# Patient Record
Sex: Female | Born: 1960
Health system: Southern US, Community
[De-identification: ages and names within clinical notes are randomized; demographics above are authoritative.]

## PROBLEM LIST (undated history)

## (undated) ENCOUNTER — Emergency Department (HOSPITAL_COMMUNITY): Admission: EM | Payer: BLUE CROSS/BLUE SHIELD | Source: Home / Self Care

## (undated) DIAGNOSIS — M1990 Unspecified osteoarthritis, unspecified site: Secondary | ICD-10-CM

## (undated) DIAGNOSIS — J989 Respiratory disorder, unspecified: Secondary | ICD-10-CM

## (undated) DIAGNOSIS — E039 Hypothyroidism, unspecified: Secondary | ICD-10-CM

## (undated) DIAGNOSIS — J189 Pneumonia, unspecified organism: Secondary | ICD-10-CM

## (undated) DIAGNOSIS — Z86718 Personal history of other venous thrombosis and embolism: Secondary | ICD-10-CM

## (undated) DIAGNOSIS — Z8489 Family history of other specified conditions: Secondary | ICD-10-CM

## (undated) DIAGNOSIS — G629 Polyneuropathy, unspecified: Secondary | ICD-10-CM

## (undated) DIAGNOSIS — M797 Fibromyalgia: Secondary | ICD-10-CM

## (undated) DIAGNOSIS — O24419 Gestational diabetes mellitus in pregnancy, unspecified control: Secondary | ICD-10-CM

## (undated) DIAGNOSIS — I429 Cardiomyopathy, unspecified: Secondary | ICD-10-CM

## (undated) DIAGNOSIS — R52 Pain, unspecified: Secondary | ICD-10-CM

## (undated) DIAGNOSIS — R51 Headache: Secondary | ICD-10-CM

## (undated) DIAGNOSIS — R0600 Dyspnea, unspecified: Secondary | ICD-10-CM

## (undated) DIAGNOSIS — F209 Schizophrenia, unspecified: Secondary | ICD-10-CM

## (undated) DIAGNOSIS — R252 Cramp and spasm: Secondary | ICD-10-CM

## (undated) DIAGNOSIS — J40 Bronchitis, not specified as acute or chronic: Secondary | ICD-10-CM

## (undated) DIAGNOSIS — H9190 Unspecified hearing loss, unspecified ear: Secondary | ICD-10-CM

## (undated) DIAGNOSIS — Z87891 Personal history of nicotine dependence: Secondary | ICD-10-CM

## (undated) DIAGNOSIS — I10 Essential (primary) hypertension: Secondary | ICD-10-CM

## (undated) DIAGNOSIS — O24919 Unspecified diabetes mellitus in pregnancy, unspecified trimester: Secondary | ICD-10-CM

## (undated) DIAGNOSIS — F32A Depression, unspecified: Secondary | ICD-10-CM

## (undated) DIAGNOSIS — J45909 Unspecified asthma, uncomplicated: Secondary | ICD-10-CM

## (undated) DIAGNOSIS — F319 Bipolar disorder, unspecified: Secondary | ICD-10-CM

## (undated) DIAGNOSIS — S3992XA Unspecified injury of lower back, initial encounter: Secondary | ICD-10-CM

## (undated) DIAGNOSIS — J9819 Other pulmonary collapse: Secondary | ICD-10-CM

## (undated) DIAGNOSIS — I5189 Other ill-defined heart diseases: Secondary | ICD-10-CM

## (undated) DIAGNOSIS — F4310 Post-traumatic stress disorder, unspecified: Secondary | ICD-10-CM

## (undated) DIAGNOSIS — I499 Cardiac arrhythmia, unspecified: Secondary | ICD-10-CM

## (undated) DIAGNOSIS — F329 Major depressive disorder, single episode, unspecified: Secondary | ICD-10-CM

## (undated) HISTORY — PX: OTHER SURGICAL HISTORY: SHX169

## (undated) HISTORY — DX: Major depressive disorder, single episode, unspecified: F32.9

## (undated) HISTORY — DX: Unspecified hearing loss, unspecified ear: H91.90

## (undated) HISTORY — DX: Fibromyalgia: M79.7

## (undated) HISTORY — DX: Headache: R51

## (undated) HISTORY — DX: Unspecified injury of lower back, initial encounter: S39.92XA

## (undated) HISTORY — PX: TONSILLECTOMY AND ADENOIDECTOMY: SHX28

## (undated) HISTORY — DX: Cramp and spasm: R25.2

## (undated) HISTORY — DX: Essential (primary) hypertension: I10

## (undated) HISTORY — DX: Respiratory disorder, unspecified: J98.9

## (undated) HISTORY — DX: Unspecified osteoarthritis, unspecified site: M19.90

## (undated) HISTORY — DX: Other ill-defined heart diseases: I51.89

## (undated) HISTORY — PX: TONSILLECTOMY: SUR1361

## (undated) HISTORY — DX: Personal history of nicotine dependence: Z87.891

## (undated) HISTORY — DX: Depression, unspecified: F32.A

## (undated) HISTORY — PX: EXTERNAL EAR SURGERY: SHX627

## (undated) HISTORY — DX: Pain, unspecified: R52

## (undated) HISTORY — PX: HERNIA REPAIR: SHX51

## (undated) HISTORY — PX: CERVICAL FUSION: SHX112

## (undated) HISTORY — PX: ABDOMINAL HYSTERECTOMY: SHX81

## (undated) HISTORY — DX: Cardiomyopathy, unspecified: I42.9

## (undated) HISTORY — PX: CARDIAC CATHETERIZATION: SHX172

## (undated) HISTORY — DX: Gestational diabetes mellitus in pregnancy, unspecified control: O24.419

## (undated) HISTORY — PX: NECK SURGERY: SHX720

## (undated) HISTORY — PX: APPENDECTOMY: SHX54

---

## 1999-05-02 ENCOUNTER — Ambulatory Visit (HOSPITAL_COMMUNITY): Admission: RE | Admit: 1999-05-02 | Discharge: 1999-05-02 | Payer: Self-pay | Admitting: Pulmonary Disease

## 1999-05-02 ENCOUNTER — Encounter: Payer: Self-pay | Admitting: Pulmonary Disease

## 1999-05-08 ENCOUNTER — Inpatient Hospital Stay (HOSPITAL_COMMUNITY): Admission: AD | Admit: 1999-05-08 | Discharge: 1999-05-10 | Payer: Self-pay | Admitting: Pulmonary Disease

## 1999-05-09 ENCOUNTER — Encounter: Payer: Self-pay | Admitting: Pulmonary Disease

## 2000-02-20 ENCOUNTER — Other Ambulatory Visit: Admission: RE | Admit: 2000-02-20 | Discharge: 2000-02-20 | Payer: Self-pay | Admitting: Family Medicine

## 2000-12-30 ENCOUNTER — Encounter: Admission: RE | Admit: 2000-12-30 | Discharge: 2001-02-10 | Payer: Self-pay | Admitting: Family Medicine

## 2001-06-21 ENCOUNTER — Encounter: Admission: RE | Admit: 2001-06-21 | Discharge: 2001-07-26 | Payer: Self-pay | Admitting: Family Medicine

## 2001-08-09 ENCOUNTER — Encounter: Payer: Self-pay | Admitting: Family Medicine

## 2001-08-09 ENCOUNTER — Encounter: Admission: RE | Admit: 2001-08-09 | Discharge: 2001-08-09 | Payer: Self-pay | Admitting: Family Medicine

## 2001-11-24 ENCOUNTER — Encounter: Payer: Self-pay | Admitting: Neurosurgery

## 2001-11-28 ENCOUNTER — Ambulatory Visit (HOSPITAL_COMMUNITY): Admission: RE | Admit: 2001-11-28 | Discharge: 2001-11-28 | Payer: Self-pay | Admitting: Neurosurgery

## 2001-11-28 ENCOUNTER — Encounter: Payer: Self-pay | Admitting: Neurosurgery

## 2001-12-27 ENCOUNTER — Encounter: Admission: RE | Admit: 2001-12-27 | Discharge: 2001-12-27 | Payer: Self-pay | Admitting: Neurosurgery

## 2001-12-27 ENCOUNTER — Encounter: Payer: Self-pay | Admitting: Neurosurgery

## 2002-05-16 ENCOUNTER — Encounter: Admission: RE | Admit: 2002-05-16 | Discharge: 2002-08-08 | Payer: Self-pay | Admitting: Neurosurgery

## 2002-08-28 ENCOUNTER — Encounter: Payer: Self-pay | Admitting: Family Medicine

## 2002-08-28 ENCOUNTER — Encounter: Admission: RE | Admit: 2002-08-28 | Discharge: 2002-08-28 | Payer: Self-pay | Admitting: Family Medicine

## 2003-05-14 ENCOUNTER — Emergency Department (HOSPITAL_COMMUNITY): Admission: EM | Admit: 2003-05-14 | Discharge: 2003-05-14 | Payer: Self-pay | Admitting: Emergency Medicine

## 2003-06-20 ENCOUNTER — Encounter: Admission: RE | Admit: 2003-06-20 | Discharge: 2003-06-20 | Payer: Self-pay | Admitting: Neurosurgery

## 2003-09-11 ENCOUNTER — Ambulatory Visit: Payer: Self-pay | Admitting: *Deleted

## 2003-11-27 ENCOUNTER — Encounter: Admission: RE | Admit: 2003-11-27 | Discharge: 2003-11-27 | Payer: Self-pay | Admitting: Family Medicine

## 2003-11-29 ENCOUNTER — Other Ambulatory Visit: Admission: RE | Admit: 2003-11-29 | Discharge: 2003-11-29 | Payer: Self-pay | Admitting: Family Medicine

## 2004-06-19 ENCOUNTER — Emergency Department (HOSPITAL_COMMUNITY): Admission: EM | Admit: 2004-06-19 | Discharge: 2004-06-20 | Payer: Self-pay | Admitting: Emergency Medicine

## 2004-07-14 ENCOUNTER — Ambulatory Visit: Payer: Self-pay | Admitting: *Deleted

## 2004-07-21 ENCOUNTER — Ambulatory Visit: Payer: Self-pay | Admitting: *Deleted

## 2004-07-28 ENCOUNTER — Ambulatory Visit: Payer: Self-pay | Admitting: *Deleted

## 2004-08-04 ENCOUNTER — Ambulatory Visit: Payer: Self-pay | Admitting: *Deleted

## 2004-09-01 ENCOUNTER — Ambulatory Visit: Payer: Self-pay | Admitting: *Deleted

## 2004-09-08 ENCOUNTER — Ambulatory Visit: Payer: Self-pay | Admitting: *Deleted

## 2004-09-22 ENCOUNTER — Ambulatory Visit: Payer: Self-pay | Admitting: *Deleted

## 2004-10-03 ENCOUNTER — Ambulatory Visit: Payer: Self-pay | Admitting: *Deleted

## 2004-10-17 ENCOUNTER — Ambulatory Visit: Payer: Self-pay | Admitting: *Deleted

## 2005-01-01 ENCOUNTER — Encounter: Admission: RE | Admit: 2005-01-01 | Discharge: 2005-01-01 | Payer: Self-pay | Admitting: Family Medicine

## 2006-02-11 ENCOUNTER — Other Ambulatory Visit: Admission: RE | Admit: 2006-02-11 | Discharge: 2006-02-11 | Payer: Self-pay | Admitting: Family Medicine

## 2006-05-18 ENCOUNTER — Ambulatory Visit (HOSPITAL_COMMUNITY): Admission: RE | Admit: 2006-05-18 | Discharge: 2006-05-18 | Payer: Self-pay | Admitting: Anesthesiology

## 2006-07-13 ENCOUNTER — Ambulatory Visit (HOSPITAL_COMMUNITY): Admission: RE | Admit: 2006-07-13 | Discharge: 2006-07-14 | Payer: Self-pay | Admitting: Neurosurgery

## 2006-08-11 ENCOUNTER — Encounter: Admission: RE | Admit: 2006-08-11 | Discharge: 2006-08-11 | Payer: Self-pay | Admitting: Neurosurgery

## 2007-04-01 ENCOUNTER — Ambulatory Visit: Payer: Self-pay | Admitting: *Deleted

## 2007-04-02 ENCOUNTER — Emergency Department (HOSPITAL_COMMUNITY): Admission: EM | Admit: 2007-04-02 | Discharge: 2007-04-02 | Payer: Self-pay | Admitting: Emergency Medicine

## 2007-04-20 ENCOUNTER — Ambulatory Visit: Payer: Self-pay | Admitting: *Deleted

## 2007-04-27 ENCOUNTER — Ambulatory Visit: Payer: Self-pay | Admitting: *Deleted

## 2007-05-06 ENCOUNTER — Ambulatory Visit: Payer: Self-pay | Admitting: *Deleted

## 2007-05-11 ENCOUNTER — Ambulatory Visit: Payer: Self-pay | Admitting: *Deleted

## 2007-05-18 ENCOUNTER — Ambulatory Visit: Payer: Self-pay | Admitting: *Deleted

## 2007-05-25 ENCOUNTER — Ambulatory Visit: Payer: Self-pay | Admitting: *Deleted

## 2007-07-19 ENCOUNTER — Encounter: Admission: RE | Admit: 2007-07-19 | Discharge: 2007-07-19 | Payer: Self-pay | Admitting: Anesthesiology

## 2008-08-24 LAB — HM COLONOSCOPY

## 2009-05-03 ENCOUNTER — Encounter: Admission: RE | Admit: 2009-05-03 | Discharge: 2009-05-03 | Payer: Self-pay | Admitting: Obstetrics and Gynecology

## 2009-06-11 ENCOUNTER — Inpatient Hospital Stay (HOSPITAL_COMMUNITY): Admission: EM | Admit: 2009-06-11 | Discharge: 2009-06-14 | Payer: Self-pay | Admitting: Emergency Medicine

## 2010-06-23 ENCOUNTER — Encounter: Admission: RE | Admit: 2010-06-23 | Discharge: 2010-06-23 | Payer: Self-pay | Admitting: Obstetrics and Gynecology

## 2010-09-14 ENCOUNTER — Encounter: Payer: Self-pay | Admitting: Anesthesiology

## 2010-11-25 ENCOUNTER — Encounter (HOSPITAL_COMMUNITY)
Admission: RE | Admit: 2010-11-25 | Discharge: 2010-11-25 | Disposition: A | Discharge status: Home / Self Care | Payer: 59 | Source: Ambulatory Visit | Attending: Obstetrics and Gynecology | Admitting: Obstetrics and Gynecology

## 2010-11-25 LAB — CBC
HCT: 40.7 % (ref 36.0–46.0)
Hemoglobin: 13.9 g/dL (ref 12.0–15.0)
MCH: 34.1 pg — ABNORMAL HIGH (ref 26.0–34.0)
MCHC: 34.2 g/dL (ref 30.0–36.0)
MCV: 99.8 fL (ref 78.0–100.0)
Platelets: 343 10*3/uL (ref 150–400)
RBC: 4.08 MIL/uL (ref 3.87–5.11)
RDW: 13.8 % (ref 11.5–15.5)
WBC: 8.1 10*3/uL (ref 4.0–10.5)

## 2010-11-25 LAB — SURGICAL PCR SCREEN
MRSA, PCR: NEGATIVE
Staphylococcus aureus: NEGATIVE

## 2010-11-26 ENCOUNTER — Other Ambulatory Visit: Payer: Self-pay | Admitting: Obstetrics and Gynecology

## 2010-11-26 ENCOUNTER — Ambulatory Visit (HOSPITAL_COMMUNITY)
Admission: RE | Admit: 2010-11-26 | Discharge: 2010-11-27 | Disposition: A | Discharge status: Home / Self Care | Payer: 59 | Source: Ambulatory Visit | Attending: Obstetrics and Gynecology | Admitting: Obstetrics and Gynecology

## 2010-11-26 DIAGNOSIS — N920 Excessive and frequent menstruation with regular cycle: Secondary | ICD-10-CM | POA: Insufficient documentation

## 2010-11-26 DIAGNOSIS — N946 Dysmenorrhea, unspecified: Secondary | ICD-10-CM | POA: Insufficient documentation

## 2010-11-26 LAB — ABO/RH: ABO/RH(D): A NEG

## 2010-11-26 LAB — TYPE AND SCREEN
ABO/RH(D): A NEG
Antibody Screen: NEGATIVE

## 2010-11-26 LAB — PREGNANCY, URINE: Preg Test, Ur: NEGATIVE

## 2010-11-27 LAB — CBC
HCT: 31.1 % — ABNORMAL LOW (ref 36.0–46.0)
HCT: 32.7 % — ABNORMAL LOW (ref 36.0–46.0)
HCT: 32.8 % — ABNORMAL LOW (ref 36.0–46.0)
HCT: 40.2 % (ref 36.0–46.0)
Hemoglobin: 10.2 g/dL — ABNORMAL LOW (ref 12.0–15.0)
Hemoglobin: 11.4 g/dL — ABNORMAL LOW (ref 12.0–15.0)
Hemoglobin: 11.5 g/dL — ABNORMAL LOW (ref 12.0–15.0)
Hemoglobin: 13.9 g/dL (ref 12.0–15.0)
MCH: 33.6 pg (ref 26.0–34.0)
MCHC: 32.8 g/dL (ref 30.0–36.0)
MCHC: 34.6 g/dL (ref 30.0–36.0)
MCHC: 34.7 g/dL (ref 30.0–36.0)
MCHC: 35.1 g/dL (ref 30.0–36.0)
MCV: 102.3 fL — ABNORMAL HIGH (ref 78.0–100.0)
MCV: 98.8 fL (ref 78.0–100.0)
MCV: 98.9 fL (ref 78.0–100.0)
MCV: 99.3 fL (ref 78.0–100.0)
Platelets: 241 10*3/uL (ref 150–400)
Platelets: 359 10*3/uL (ref 150–400)
Platelets: 386 10*3/uL (ref 150–400)
Platelets: 428 10*3/uL — ABNORMAL HIGH (ref 150–400)
RBC: 3.04 MIL/uL — ABNORMAL LOW (ref 3.87–5.11)
RBC: 3.3 MIL/uL — ABNORMAL LOW (ref 3.87–5.11)
RBC: 3.31 MIL/uL — ABNORMAL LOW (ref 3.87–5.11)
RBC: 4.07 MIL/uL (ref 3.87–5.11)
RDW: 14.1 % (ref 11.5–15.5)
RDW: 12.9 % (ref 11.5–15.5)
RDW: 13.1 % (ref 11.5–15.5)
RDW: 13.1 % (ref 11.5–15.5)
WBC: 9 10*3/uL (ref 4.0–10.5)
WBC: 13.6 10*3/uL — ABNORMAL HIGH (ref 4.0–10.5)
WBC: 7.2 10*3/uL (ref 4.0–10.5)
WBC: 7.5 10*3/uL (ref 4.0–10.5)

## 2010-11-27 LAB — BASIC METABOLIC PANEL
BUN: 3 mg/dL — ABNORMAL LOW (ref 6–23)
BUN: 3 mg/dL — ABNORMAL LOW (ref 6–23)
BUN: 5 mg/dL — ABNORMAL LOW (ref 6–23)
CO2: 26 mEq/L (ref 19–32)
CO2: 26 mEq/L (ref 19–32)
CO2: 26 mEq/L (ref 19–32)
Calcium: 8 mg/dL — ABNORMAL LOW (ref 8.4–10.5)
Calcium: 8.6 mg/dL (ref 8.4–10.5)
Calcium: 8.8 mg/dL (ref 8.4–10.5)
Chloride: 104 mEq/L (ref 96–112)
Chloride: 105 mEq/L (ref 96–112)
Chloride: 105 mEq/L (ref 96–112)
Creatinine, Ser: 0.52 mg/dL (ref 0.4–1.2)
Creatinine, Ser: 0.63 mg/dL (ref 0.4–1.2)
Creatinine, Ser: 0.72 mg/dL (ref 0.4–1.2)
GFR calc Af Amer: >60 mL/min (ref >60)
GFR calc Af Amer: >60 mL/min (ref >60)
GFR calc Af Amer: >60 mL/min (ref >60)
GFR calc non Af Amer: >60 mL/min (ref >60)
GFR calc non Af Amer: >60 mL/min (ref >60)
GFR calc non Af Amer: >60 mL/min (ref >60)
Glucose, Bld: 104 mg/dL — ABNORMAL HIGH (ref 70–99)
Glucose, Bld: 93 mg/dL (ref 70–99)
Glucose, Bld: 95 mg/dL (ref 70–99)
Potassium: 3.4 mEq/L — ABNORMAL LOW (ref 3.5–5.1)
Potassium: 3.7 mEq/L (ref 3.5–5.1)
Potassium: 3.8 mEq/L (ref 3.5–5.1)
Sodium: 134 mEq/L — ABNORMAL LOW (ref 135–145)
Sodium: 137 mEq/L (ref 135–145)
Sodium: 138 mEq/L (ref 135–145)

## 2010-11-27 LAB — CARDIAC PANEL(CRET KIN+CKTOT+MB+TROPI)
CK, MB: 0.9 ng/mL (ref 0.3–4.0)
CK, MB: 1.6 ng/mL (ref 0.3–4.0)
Relative Index: INVALID (ref 0.0–2.5)
Relative Index: INVALID (ref 0.0–2.5)
Total CK: 37 U/L (ref 7–177)
Total CK: 54 U/L (ref 7–177)
Troponin I: 0.01 ng/mL (ref 0.00–0.06)
Troponin I: 0.01 ng/mL (ref 0.00–0.06)

## 2010-11-27 LAB — POCT I-STAT, CHEM 8
BUN: 4 mg/dL — ABNORMAL LOW (ref 6–23)
Calcium, Ion: 1.05 mmol/L — ABNORMAL LOW (ref 1.12–1.32)
Chloride: 103 mEq/L (ref 96–112)
Creatinine, Ser: 0.7 mg/dL (ref 0.4–1.2)
Glucose, Bld: 113 mg/dL — ABNORMAL HIGH (ref 70–99)
HCT: 43 % (ref 36.0–46.0)
Hemoglobin: 14.6 g/dL (ref 12.0–15.0)
Potassium: 3.6 mEq/L (ref 3.5–5.1)
Sodium: 135 mEq/L (ref 135–145)
TCO2: 26 mmol/L (ref 0–100)

## 2010-11-27 LAB — COMPREHENSIVE METABOLIC PANEL
ALT: 25 U/L (ref 0–35)
AST: 19 U/L (ref 0–37)
Albumin: 3.9 g/dL (ref 3.5–5.2)
Alkaline Phosphatase: 60 U/L (ref 39–117)
BUN: 4 mg/dL — ABNORMAL LOW (ref 6–23)
CO2: 26 mEq/L (ref 19–32)
Calcium: 9.6 mg/dL (ref 8.4–10.5)
Chloride: 103 mEq/L (ref 96–112)
Creatinine, Ser: 0.64 mg/dL (ref 0.4–1.2)
GFR calc Af Amer: >60 mL/min (ref >60)
GFR calc non Af Amer: >60 mL/min (ref >60)
Glucose, Bld: 114 mg/dL — ABNORMAL HIGH (ref 70–99)
Potassium: 3.6 mEq/L (ref 3.5–5.1)
Sodium: 138 mEq/L (ref 135–145)
Total Bilirubin: 0.6 mg/dL (ref 0.3–1.2)
Total Protein: 7.3 g/dL (ref 6.0–8.3)

## 2010-11-27 LAB — DIFFERENTIAL
Basophils Absolute: 0.1 10*3/uL (ref 0.0–0.1)
Basophils Relative: 1 % (ref 0–1)
Eosinophils Absolute: 0 10*3/uL (ref 0.0–0.7)
Eosinophils Relative: 0 % (ref 0–5)
Lymphocytes Relative: 16 % (ref 12–46)
Lymphs Abs: 2.1 10*3/uL (ref 0.7–4.0)
Monocytes Absolute: 0.9 10*3/uL (ref 0.1–1.0)
Monocytes Relative: 6 % (ref 3–12)
Neutro Abs: 10.5 10*3/uL — ABNORMAL HIGH (ref 1.7–7.7)
Neutrophils Relative %: 77 % (ref 43–77)

## 2010-11-27 LAB — URINALYSIS, ROUTINE W REFLEX MICROSCOPIC
Bilirubin Urine: NEGATIVE
Glucose, UA: NEGATIVE mg/dL
Ketones, ur: 40 mg/dL — AB
Leukocytes, UA: NEGATIVE
Nitrite: NEGATIVE
Protein, ur: NEGATIVE mg/dL
Specific Gravity, Urine: 1.011 (ref 1.005–1.030)
Urobilinogen, UA: 1 mg/dL (ref 0.0–1.0)
pH: 6.5 (ref 5.0–8.0)

## 2010-11-27 LAB — CK TOTAL AND CKMB (NOT AT ARMC)
CK, MB: 0.8 ng/mL (ref 0.3–4.0)
Relative Index: INVALID (ref 0.0–2.5)
Total CK: 44 U/L (ref 7–177)

## 2010-11-27 LAB — LIPID PANEL
Cholesterol: 176 mg/dL (ref 0–200)
HDL: 41 mg/dL (ref >39)
LDL Cholesterol: 111 mg/dL — ABNORMAL HIGH (ref 0–99)
Total CHOL/HDL Ratio: 4.3 RATIO
Triglycerides: 121 mg/dL (ref <150)
VLDL: 24 mg/dL (ref 0–40)

## 2010-11-27 LAB — PROTIME-INR
INR: 0.94 (ref 0.00–1.49)
INR: 0.97 (ref 0.00–1.49)
Prothrombin Time: 12.5 seconds (ref 11.6–15.2)
Prothrombin Time: 12.8 seconds (ref 11.6–15.2)

## 2010-11-27 LAB — TROPONIN I: Troponin I: 0.01 ng/mL (ref 0.00–0.06)

## 2010-11-27 LAB — POCT CARDIAC MARKERS
CKMB, poc: <1 ng/mL — ABNORMAL LOW (ref 1.0–8.0)
Myoglobin, poc: 72 ng/mL (ref 12–200)
Troponin i, poc: <0.05 ng/mL (ref 0.00–0.09)

## 2010-11-27 LAB — TSH: TSH: 6.901 u[IU]/mL — ABNORMAL HIGH (ref 0.350–4.500)

## 2010-11-27 LAB — URINE MICROSCOPIC-ADD ON

## 2010-11-27 LAB — GLUCOSE, CAPILLARY: Glucose-Capillary: 97 mg/dL (ref 70–99)

## 2010-12-08 NOTE — Op Note (Signed)
[name], [name] NO.:  [id number]  MEDICAL RECORD NO.:  [id number]           PATIENT TYPE:  O  LOCATION:  9303                          FACILITY:  [facility]  PHYSICIAN:  Huel Cote, M.D. DATE OF BIRTH:  December 18, 1960  DATE OF PROCEDURE:  11/26/2010 DATE OF DISCHARGE:                              OPERATIVE REPORT   PREOPERATIVE DIAGNOSES: 1. Dysmenorrhea. 2. Menorrhagia.  POSTOPERATIVE DIAGNOSES: 1. Dysmenorrhea. 2. Menorrhagia.  PROCEDURE:  Laparoscopic supracervical hysterectomy with an open scope procedure.  SURGEON:  Huel Cote, MD  ASSISTANT:  Zenaida Niece, MD  ANESTHESIA:  General.  FINDINGS:  Uterus was retroverted, boggy, and normal in size.  Normal tubes and ovaries were noted.  SPECIMENS:  The uterus fundus was sent to pathology.  ESTIMATED BLOOD LOSS:  50 mL.  URINE OUTPUT:  Approximately 75 mL clear concentrated urine.  IV FLUIDS:  1600 mL LR.  DISPOSITION:  The patient was taken to the recovery room in good condition.  PROCEDURE IN DETAIL:  The patient was taken to operating room where general anesthesia was obtained without difficulty.  She was then prepped and draped in the normal sterile fashion in the dorsal lithotomy position with the SCDs in place and preoperative heparin given due to her history of DVT.  The patient was then prepped and a vaginal prep performed also after she was draped.  Attention was turned to the infraumbilical area where 0.25% bupivacaine was injected into the skin. A small incision was then made with scalpel and the area elevated and the Veress needle introduced into the peritoneal cavity.  Normal opening pressure was noted and pneumoperitoneum was obtained.  The OptiVu 5-mm trocar was then utilized in the same incision and the Veress needle removed.  The trocar was attempted to be placed twice and good visualization of the peritoneal cavity was not obvious.  It was unclear whether the  patient had any mesenteric adhesions or we were getting into the mesentery with our trocar.  Therefore, the 5-mm trocar was removed and the decision was made to change to an open scope approach with the Hasson.  The umbilical incision was extended slightly and the fascia grasped with Kocher clamps, elevated, and entered sharply.  The peritoneal cavity itself was entered bluntly and once this was entered, the Hasson trocar placed directly into the incision after the fascia was visualized and a pursestring suture placed of 0-Vicryl around it.  The balloon was then inflated and intraperitoneal placement was confirmed and the camera introduced with normal anatomy noted.  The patient was placed in Trendelenburg and the uterus and ovaries appeared normal except for a boggy retroverted uterus.  The patient had some small amount of scarring on the right lower quadrant.  She also had a normal- appearing bowel and at this point, two additional trocars were placed. A 5-mm trocar in the right upper quadrant after injection with 0.25% bupivacaine and a 10-11 in the left upper quadrant.  These were both placed under direct visualization with the camera and once they were in place, the uterus was grasped with the tenaculum.  The utero-ovarian ligament and fallopian  tube were then taken down with harmonic scalpel on the right.  This was carried down through the broad ligament and the round ligament as well.  This was then dissected further and the bladder flap created and taken to the midline.  The uterine artery was then skeletonized and then taken down with the harmonic scalpel.  Once this was done and there was no bleeding noted, the attention was turned to the patient's left where in a similar fashion the fallopian tube, broad ligament, and utero-ovarian ligament were taken down with harmonic scalpel at the level of the round ligament.  This was also transected with the harmonic scalpel and the  bladder flap created in the midline with the other incision.  The uterine artery was skeletonized again and the harmonic scalpel utilized to transect the uterine artery.  There was no active bleeding noted and the beginning of the coming across the cervix was performed and this was accomplished by approaching from both sides.  The cervix was then transected at the level just above the uterosacral ligaments and the uterus completely amputated from the cervical stalk.  Again, no significant active bleeding was noted.  Small amount of oozing posteriorly was easily controlled with the harmonic scalpel.  The morcellator then replaced the 10-11 trocar in the patient's left quadrant and with this in place, the uterus was sequentially morselized and taken out under direct visualization.  Any small pieces were removed with the scope and a careful inspection of the abdomen and pelvis revealed no further fragments.  Once this was completed, the abdomen and pelvis were irrigated very well and all pedicles inspected.  The utero-ovarian arteries appeared hemostatic. The uterine arteries appeared hemostatic and the cervical stump itself appeared hemostatic.  Therefore, all instruments and sponges were removed from the abdomen and the intercede obtained and placed over the cervical stump.  Once this was placed, all instruments and sponges were removed and the 10-11 trocar was removed and the 10-11 trocar site in the upper left quadrant was closed under direct visualization with the camera with the fascia closed adequately.  The umbilical port was then removed and the previously placed pursestring suture of 0-Vicryl was tied down.  There was one small area to the left which I felt needed to be approximated as well and the fascia was grasped and closed under direct visualization in this area with a good fascial closure noted. The skin was then closed at all port sites with 3-0 Vicryl and Dermabond.  All  instruments and sponges were counted and correct and the patient was taken to the recovery room in good condition.     Huel Cote, M.D.     KR/MEDQ  D:  11/26/2010  T:  11/27/2010  Job:  [id number]  Electronically Signed by Huel Cote M.D. on 12/07/2010 10:47:33 AM

## 2010-12-08 NOTE — Discharge Summary (Signed)
  [name], [name] NO.:  [id number]  MEDICAL RECORD NO.:  [id number]           PATIENT TYPE:  LOCATION:                                 FACILITY:  PHYSICIAN:  Huel Cote, M.D. DATE OF BIRTH:  Dec 31, 1960  DATE OF ADMISSION:  11/25/2010 DATE OF DISCHARGE:  11/26/2010                              DISCHARGE SUMMARY   DISCHARGE DIAGNOSES: 1. Dysmenorrhea. 2. Menorrhagia. 3. Status post open scope with laparoscopic supracervical     hysterectomy.  DISCHARGE MEDICATIONS: 1. Motrin 600 mg p.o. every 6 hours p.r.n. 2. Percocet 1-2 tablets p.o. every 4 hours p.r.n.  DISCHARGE FOLLOWUP:  The patient is to follow up in the office in 2 weeks for an incision check.  HOSPITAL COURSE:  The patient is a 49 year old G4, P2 who came in for a scheduled laparoscopic supracervical hysterectomy, which she underwent on November 26, 2010.  She underwent surgery without difficulty.  It was done as an open scopes due to some difficulty getting intraperitoneal with 5-mm trocar; however, the remainder of surgery was uneventful, and she was then admitted for routine postoperative care.  On postop day #1, she was tolerating regular diet with absolutely no nausea and vomiting. Her abdomen was soft.  She had some mild bruising in her incision sites, but they were well approximated.  Her pain was well controlled with p.o. medications, and she was passing flatus in a.m. of discharge.  She had her Foley catheter removed and assuming that she has had adequate voiding, she will be discharged home later this afternoon to follow up in the office in 2 weeks.  We did spend good amount of time discussing her history of DVT for which she was on heparin in the hospital after consult with Hematology.  They did not feel she needed to remain on any long-term Lovenox; however, I did discuss with the patient that she needed to be active, and not have long periods of immobility as well as she may  want to take an aspirin daily, and she is agreeable with this plan.     Huel Cote, M.D.     KR/MEDQ  D:  11/27/2010  T:  11/28/2010  Job:  [id number]  Electronically Signed by Huel Cote M.D. on 12/07/2010 10:47:32 AM

## 2010-12-08 NOTE — H&P (Addendum)
NAME:  [name], [name] NO.:  [id number]  MEDICAL RECORD NO.:  [id number]           PATIENT TYPE:  O  LOCATION:  SDC                           [facility]ACILITY:  [facility]  PHYSICIAN:  Huel Cote, M.D. DATE OF BIRTH:  12/21/1960  DATE OF ADMISSION:  11/25/2010 DATE OF DISCHARGE:                             HISTORY & PHYSICAL   Her surgery is to take place at the [facility] on November 26, 2010 at 9 a.m.  HISTORY OF PRESENT ILLNESS:  The patient is a 50 year old G4, P2 who is coming in for a scheduled laparoscopic supracervical hysterectomy for an ongoing problem with menorrhagia and debilitating dysmenorrhea.  The patient has reported this problem for several years and has had workup performed in the past including a endometrial biopsy, which was benign and a saline infused ultrasound performed in August 2010, which showed a normal uterus and uterine cavity.  The patient reports that for several years, she has had increasing problems with period pain and her cycles are now heavy and increasingly irregular.  She does have a low back pain and spots almost constantly between her cycles.  She now wishes to have definitive surgical treatment of her problem.  We discussed options of NovaSure versus a laparoscopic supracervical hysterectomy and the patient would like to proceed with that for definitive treatment as she is still premenopausal.  We did discuss removal of tubes and ovaries and she will most likely retain her tubes and ovaries if they appear normal.  PAST MEDICAL HISTORY:  Significant for history of a DVT with pregnancy and on birth control pills.  She did have some workup performed at that time, which was negative.  We also performed a hematological workup with a factor V Leiden test and the lupus anticoagulant test and a prothrombin factor II gene analysis, which was all within normal limits. After discussion with Dr. Cyndie Chime, he recommended that  she be treated just in the hospital with heparin and that she had no need for prolonged treatment postoperatively.  She also has some congenital pulmonary blebs that do not cause any issues.  PAST SURGICAL HISTORY:  Significant for ear surgery and a cervical fusion with diskectomy x2.  PAST OBSTETRICAL HISTORY:  Significant for 2 prior vaginal deliveries. With her second child, she had the DVT and 2 elective abortion.  PAST GYN HISTORY:  No abnormal Pap smears.  The menorrhagia and dysmenorrhea as discussed.  FAMILY HISTORY:  She has a history of stroke in her mother and breast cancer in her sister who was diagnosed in the mid 32s and died at 38. She has no colon cancer.  There was an aunt with ovarian cancer in her 13s.  SOCIAL HISTORY:  She is a smoker of about a half-a-pack a day.  ALLERGIES:  NEURONTIN.  MEDICATIONS:  Include; diazepam, metoprolol, Celexa and Mytrex.  PHYSICAL EXAMINATION:  VITAL SIGNS:  Her blood pressure is 130/80. BREASTS:  No masses, discharge or adenopathy noted.  She had a normalmammogram noted in October 2011. CARDIAC:  Regular rate and rhythm. LUNGS:  Clear. ABDOMEN:  Soft and nontender. PELVIC:  Normal external  genitalia. CERVIX:  No lesions, but has minimal descents.  Uterus is retroverted, but normal in size.  Adnexa have no masses.  As stated, she had a biopsy performed which was normal and a Pap smear also performed with an HPV test and both were negative in February 2012.  The risks and benefits of surgery were discussed with the patient in detail including bleeding, infection and possible damage to bowel and bladder as well as the risk of blood clots.  We discussed also the possible need for an open procedure should any complication arise, which would all obviously cause a delay in her recovery and a prolonged hospitalization.  As stated, her case was discussed with Dr. Cyndie Chime in Hematology and he felt that she could do fine with just  heparin in the hospital and as long as she had normal mobility postoperatively could forego any prolonged anticoagulation.     Huel Cote, M.D.     KR/MEDQ  D:  11/25/2010  T:  11/25/2010  Job:  [id number]  Electronically Signed by Huel Cote M.D. on 12/11/2010 06:55:27 PM

## 2011-01-09 NOTE — Op Note (Signed)
[place]. [facility]  Patient:    [name], [name] Visit Number: [id number] MRN: [id number]          Service Type: DSU Location: 3000 3022 01 Attending Physician:  Gerald Dexter Dictated by:   Reinaldo Meeker, M.D. Proc. Date: 11/28/01 Admit Date:  11/28/2001                             Operative Report  PREOPERATIVE DIAGNOSIS:  Herniated disk C5-6 bilateral.  POSTOPERATIVE DIAGNOSIS:  Herniated disk C5-6 bilateral.  PROCEDURE:  C5-6 anterior cervical diskectomy with bone bank fusion followed by Zephyr anterior cervical plating with operative microscope.  SURGEON:  Reinaldo Meeker, M.D.  ASSISTANT:  Julio Sicks, M.D.  PROCEDURE IN DETAIL:  After being placed in the supine position in 5 pounds halter traction, the patients neck was prepped and draped in the usual sterile fashion. A localizing x-ray was taken prior to incision to identify the appropriate level. A transverse incision was made in the right anterior neck starting at the midline, heading towards the medial aspect of the sternocleidomastoid muscle. The platysmus was then incised transversely. The natural fascial plane between the strap muscles medially and the sternocleidomastoid laterally was identified and followed down to the anterior aspect of the cervical spine. The longus colli muscles were identified, split in the midline, stripped away bilaterally with the Kitner retractor and Art therapist. A second x-ray showed the approach to be the appropriate level. Using the 15-blade, the annulus of the disk was incised. Using pituitary rongeurs and curets approximately 90% of the disk material was removed. A high speed drill was used to widen the interspace. The microscope was draped and brought into the field and used during the remainder of the case. Using microdissection technique, the remainder of disk material down to the posterior longitudinal ligament was removed. The ligament  was then incised transversely and the cut edges removed with the Kerrison punch. A very thorough nerve root decompression was carried out to the mid foraminal level bilaterally to ensure good decompression of the spinal durae and the nerve roots bilaterally. At this point inspection was carried out in all directions for any evidence of residual compression and none could be identified. Large amounts of irrigation were carried out. Any bleeding was controlled by procoagulation and Gelfoam. Measurements were taken. A 7 x 11 x 14 mm plug was chosen and impacted without difficulty, and fluoroscopy showed the plug to be in excellent position. At this time, an appropriate length Zephyr anterior cervical plate was chosen. Under fluoroscopic guidance, drill holes were placed followed by placement of 13-mm screws x4. By using fluoroscopy, good placement of the screws and plate were ensured. At this point final fluoroscopy showed the plate and plug to be in excellent position. Large amounts of irrigation were carried and any bleeding controlled by procoagulation. The wound was then closed using interrupted Vicryl on the platysmus muscle, inverted 5-0 PDS in the subcuticular layer and Steri-Strips on the skin. Sterile dressing and soft towel were applied, and the patient was extubated to the recovery room in stable condition. Dictated by:   Reinaldo Meeker, M.D. Attending Physician:  Gerald Dexter DD:  11/28/01 TD:  11/28/01 Job: [id number]

## 2011-01-09 NOTE — Op Note (Signed)
[place]. [facility]  Patient:    EVETTA, RENNER Visit Number: [id number] MRN: [id number]          Service Type: DSU Location: 3000 3022 01 Attending Physician:  Gerald Dexter Dictated by:   Reinaldo Meeker, M.D. Proc. Date: 11/28/01 Admit Date:  11/28/2001                             Operative Report  PREOPERATIVE DIAGNOSIS: Herniated disc, C5-6 bilateral.  POSTOPERATIVE DIAGNOSIS: Same.  PROCEDURE: C5-6 anterior cervical discectomy with bone bank fusion followed by Zephyr anterior cervical plating with operating microscope.  SURGEON:  Reinaldo Meeker, M.D.  ASSISTANT:  Dr. Lelon Perla.  PROCEDURE IN DETAIL: AFter being placed in the supine position with 5 pounds halter traction, the patients neck was prepped and draped in the usual sterile fashion.  Localizing x-rays were taken prior to the incision to identify the appropriate level.  A transverse incision was made in the right anterior neck starting at the midline, heading toward the medial aspect of the sternocleidomastoid muscle.  The platysmus was then incised transversely down to the natural fascial planes, the platysmus muscles medially and the sternocleidomastoid laterally were identified and followed down to the anterior aspect of the cervical spine.  The longus coli muscles were identified and stripped away bilaterally with the Psychologist, sport and exercise.  A second x-ray showed the approach to be at the appropriate level. Using a 15 blade the annulus of the disc was incised.  Using pituitary rongeurs and curets, approximately 90% of the disc material was removed. A high speed drill was used to widen the interspace.  The microscope was draped and brought into the field and used throughout the remainder of the case.  Through microdissection technique, the remainder of the disc material down to the posterior longitudinal ligament was removed.  The ligament was then incised  transversely and the cut edges smoothed with a Kerrison punch.  A very thorough nerve root decompression was carried out to the mid foraminal level bilaterally to ensure good decompression of the spinal dura and nerve roots bilaterally. At this point, inspection was carried out in all directions without any evidence of residual compression being identified.  A large amount of irrigation was carried out.  The bleeding was controlled by bipolar coagulation and Gelfoam.  Measurements were taken and a 17 x 11 x 14 mm plug was chosen and attached without difficulty and fluoroscopy showed the plug to be in excellent position. At this time, an appropriate length of Zephyr anterior cervical plate was chosen.  Under fluoroscopic guidance drill holes were placed followed by placement of 13 mm screws times four.  By using fluoroscopy, good placement of the screws and plate was ensured. At this point, final fluoroscopy showed the plate and plug to be in excellent position.  Large amounts of irrigation were carried out and the bleeding controlled with bipolar coagulation. The wound was then closed using interrupted Vicryl on the platysmal muscle and inverted 5-0 PDS in the subcuticular layer and Steri-Strips on the skin. A sterile dressing and soft collar were applied and the patient was extubated and taken to the Recovery Room in stable condition. Dictated by:   Reinaldo Meeker, M.D. Attending Physician:  Gerald Dexter DD:  11/28/01 TD:  11/28/01 Job: [id number]

## 2011-01-09 NOTE — Op Note (Signed)
NAMEDINARA, LUPU NO.:  [id number]   MEDICAL RECORD NO.:  [id number]          PATIENT TYPE:  OIB   LOCATION:  3172                         FACILITY:  [facility]   PHYSICIAN:  Reinaldo Meeker, M.D. DATE OF BIRTH:  03-16-1961   DATE OF PROCEDURE:  07/13/2006  DATE OF DISCHARGE:                                 OPERATIVE REPORT   PREOPERATIVE DIAGNOSIS:  Herniated disk C4-5 and status post anterior  cervical diskectomy C5-6.   PROCEDURE:  Exploration and evaluation of C5-6 fusion with removal of C5-6  hardware followed by C4-5 anterior cervical diskectomy with a bone bank  fusion and Venture anterior cervical plate of U0-4.   SURGEON:  Dr. Gerlene Fee   ASSISTANT:  Dr. Marikay Alar.   PROCEDURE IN DETAIL:  After placed in supine position in 5 pounds halter  traction, the patient's neck was prepped and draped in the usual sterile  fashion.  Localizing fluoroscopy was used prior to incision to identify the  appropriate level.  Transverse incision was made in the right anterior neck  starting at the midline headed towards the medial aspect of the  sternocleidomastoid muscle.  The platysma muscle was incised transversely  and the natural fascial plane between the strap muscles medially and  sternocleidomastoid laterally was identified and followed down to the  anterior aspect of cervical spine.  Longus coli muscles were identified,  split the midline, stripped away bilaterally with unipolar for coagulation  and Kitner resector.  Old Zephir plate was identified and dissected free of  any soft tissue.  Locking mechanism was unlocked and three of the four  screws were able to be removed.  The fourth screw however, was stripped and  could not be removed.  A small cut was then made in the plate with the metal  cutting high-speed drill, plate was then removed.  Attempts to remove the  screw were not successful.  It was therefore elected to bur it down flush  with the surface  of C5 and leave the remainder of the screw within the body  of C5.  This was successfully done.  Self-retaining retractor was placed for  exposure at this time and annulus of the disk at C4-5 was incised.  Using  pituitary rongeurs and curettes approximately 90% of the disk material was  removed.  High-speed drill was used to widen the interspace.  The microscope  was draped brought in the field, used remainder the case.  Using  microsection technique the remainder of disk material on the posterior  longitudinal ligament was removed.  The ligament was incised transversely  and the cut edges removed with a Kerrison punch.  Thorough decompression was  carried out.  There is bony overgrowth and disk herniation in the proximal  foramen bilaterally.  These were removed to decompress the C5 nerve roots  bilaterally.  At this point inspection was carried out in all directions for  any evidence of residual compression and none could be identified.  Large  amounts irrigation were carried out and any bleeding controlled bipolar  coagulation and Gelfoam.  Measurements were taken and  7 mm bone bank plug  was reconstituted.  After irrigating once more confirming hemostasis the  plug was impacted difficulty and fluoroscopy showed to be in good position.  An appropriate length Venture anterior cervical plate was then chosen.  Under fluoroscopic guidance, drill holes were placed followed by placing 13  mm screws x4.  Locking mechanisms secured at all locations and final  fluoroscopy showed the plate screws and plugs to all be good position.  Large amounts of irrigation were carried out at this time and bleeding  controlled with bipolar coagulation.  Wound  was then closed using interrupted Vicryl on the platysma muscle, inverted 5-  0 PDS on the subcuticular layer and Steri-Strips on the skin.  A sterile  dressing soft collar applied.  The patient extubated, taken to recovery room  in stable  condition.           ______________________________  Reinaldo Meeker, M.D.     ROK/MEDQ  D:  07/13/2006  T:  07/13/2006  Job:  [id number]

## 2011-05-16 ENCOUNTER — Encounter: Payer: Self-pay | Admitting: Cardiovascular Disease

## 2011-06-01 ENCOUNTER — Telehealth: Payer: Self-pay | Admitting: Cardiovascular Disease

## 2011-06-01 ENCOUNTER — Other Ambulatory Visit: Payer: Self-pay | Admitting: *Deleted

## 2011-06-01 ENCOUNTER — Ambulatory Visit: Payer: 59 | Admitting: Cardiovascular Disease

## 2011-06-01 MED ORDER — METOPROLOL TARTRATE 25 MG PO TABS: 25.0000 mg | ORAL_TABLET | Freq: Two times a day (BID) | ORAL | Status: DC

## 2011-06-01 NOTE — Telephone Encounter (Signed)
Pt called and rescheduled her appt. With Dr. Elease Hashimoto because of bad cold.  She would like to know if some cough syrup/antibodic can be called in for her.  She also needs her Metoprolol called in as well.  [facility] [place], [phone].  Pt states she may have a low grade fever as well.

## 2011-06-01 NOTE — Telephone Encounter (Signed)
Metoprolol refilled, pt advised to see PCP for ABX. Pt verbalized understanding. Alfonso Ramus RN

## 2011-06-15 ENCOUNTER — Other Ambulatory Visit: Payer: Self-pay | Admitting: Obstetrics and Gynecology

## 2011-06-15 DIAGNOSIS — Z1231 Encounter for screening mammogram for malignant neoplasm of breast: Secondary | ICD-10-CM

## 2011-07-02 ENCOUNTER — Ambulatory Visit: Payer: 59 | Admitting: Cardiovascular Disease

## 2011-07-07 ENCOUNTER — Ambulatory Visit: Payer: 59

## 2011-07-09 ENCOUNTER — Ambulatory Visit: Payer: 59

## 2011-08-25 LAB — HM PAP SMEAR: HM Pap smear: NORMAL

## 2012-04-20 ENCOUNTER — Other Ambulatory Visit: Payer: Self-pay | Admitting: *Deleted

## 2012-04-20 MED ORDER — METOPROLOL TARTRATE 25 MG PO TABS: 25.0000 mg | ORAL_TABLET | Freq: Two times a day (BID) | ORAL | Status: DC

## 2012-04-20 NOTE — Telephone Encounter (Signed)
Fax Received. Refill Completed. [name] (R.M.A)   

## 2012-09-09 ENCOUNTER — Other Ambulatory Visit: Payer: Self-pay | Admitting: Obstetrics and Gynecology

## 2012-09-09 DIAGNOSIS — Z1231 Encounter for screening mammogram for malignant neoplasm of breast: Secondary | ICD-10-CM

## 2012-11-23 ENCOUNTER — Other Ambulatory Visit: Payer: Self-pay

## 2012-11-23 MED ORDER — OXYCODONE-ACETAMINOPHEN 5-325 MG PO TABS: 1.0000 | ORAL_TABLET | Freq: Three times a day (TID) | ORAL | Status: DC | PRN

## 2012-11-23 MED ORDER — DIAZEPAM 5 MG PO TABS: 5.0000 mg | ORAL_TABLET | Freq: Three times a day (TID) | ORAL | Status: DC | PRN

## 2012-11-23 NOTE — Telephone Encounter (Signed)
Patient called and left message requesting refills on Percocet and Diazepam.  She will pick up rx's when ready.

## 2012-12-26 ENCOUNTER — Other Ambulatory Visit: Payer: Self-pay

## 2012-12-26 MED ORDER — OXYCODONE-ACETAMINOPHEN 5-325 MG PO TABS: 1.0000 | ORAL_TABLET | Freq: Three times a day (TID) | ORAL | Status: DC | PRN

## 2012-12-26 NOTE — Telephone Encounter (Signed)
Patient calling to request refill on Oxycodone.  She would like to pick it up when ready.  Call back number [phone]

## 2013-01-19 ENCOUNTER — Ambulatory Visit: Payer: Self-pay | Admitting: Neurology

## 2013-01-23 ENCOUNTER — Other Ambulatory Visit: Payer: Self-pay

## 2013-01-23 MED ORDER — OXYCODONE-ACETAMINOPHEN 5-325 MG PO TABS: 1.0000 | ORAL_TABLET | Freq: Three times a day (TID) | ORAL | Status: DC | PRN

## 2013-01-23 NOTE — Telephone Encounter (Signed)
Patient called, left message.  She is requesting a refill on Oxycodone.  She would like to pick it up when it's ready.  Call back number [phone].

## 2013-02-21 ENCOUNTER — Other Ambulatory Visit: Payer: Self-pay

## 2013-02-21 MED ORDER — OXYCODONE-ACETAMINOPHEN 5-325 MG PO TABS: 1.0000 | ORAL_TABLET | Freq: Three times a day (TID) | ORAL | Status: DC | PRN

## 2013-02-21 NOTE — Telephone Encounter (Signed)
Patient called, left message requesting refill on Oxycodone.  She would like to pick up the Rx when it's ready.  Call back number [phone].

## 2013-02-21 NOTE — Telephone Encounter (Signed)
Patient  Will not receive early refills. Monthly prescription only.  Narcotic contract  In place.

## 2013-03-08 ENCOUNTER — Ambulatory Visit: Payer: 59 | Admitting: Cardiovascular Disease

## 2013-03-22 ENCOUNTER — Other Ambulatory Visit: Payer: Self-pay

## 2013-03-22 MED ORDER — CITALOPRAM HYDROBROMIDE 40 MG PO TABS: 40.0000 mg | ORAL_TABLET | Freq: Every day | ORAL | Status: DC

## 2013-03-22 MED ORDER — OXYCODONE-ACETAMINOPHEN 5-325 MG PO TABS: 1.0000 | ORAL_TABLET | Freq: Three times a day (TID) | ORAL | Status: DC | PRN

## 2013-03-22 NOTE — Telephone Encounter (Signed)
Rx ready for pick up.  I called patient, got no answer.  Left message.   

## 2013-03-22 NOTE — Telephone Encounter (Signed)
Patient called requesting a refill on Celexa and Percocet.  She would like to pick up the Rx for Percocet when it's ready.  She has an OV scheduled in Sept.  Call back number [phone].

## 2013-04-19 ENCOUNTER — Other Ambulatory Visit: Payer: Self-pay

## 2013-04-19 MED ORDER — OXYCODONE-ACETAMINOPHEN 5-325 MG PO TABS: 1.0000 | ORAL_TABLET | Freq: Three times a day (TID) | ORAL | Status: DC | PRN

## 2013-04-19 NOTE — Telephone Encounter (Signed)
Patient called requesting a refill on Percocet.  She would like to pick up the Rx when it's ready.  Call back number [phone].

## 2013-04-20 NOTE — Telephone Encounter (Signed)
Rx signed, ready for pick up.  I called the patient, got no answer.  Left message.  

## 2013-04-28 ENCOUNTER — Encounter: Payer: Self-pay | Admitting: Cardiovascular Disease

## 2013-04-28 ENCOUNTER — Ambulatory Visit (INDEPENDENT_AMBULATORY_CARE_PROVIDER_SITE_OTHER): Payer: BC Managed Care – PPO | Admitting: Cardiovascular Disease

## 2013-04-28 VITALS — BP 143/86 | HR 84 | Ht 65.0 in | Wt 136.6 lb

## 2013-04-28 DIAGNOSIS — R002 Palpitations: Secondary | ICD-10-CM

## 2013-04-28 DIAGNOSIS — I421 Obstructive hypertrophic cardiomyopathy: Secondary | ICD-10-CM | POA: Insufficient documentation

## 2013-04-28 DIAGNOSIS — R079 Chest pain, unspecified: Secondary | ICD-10-CM

## 2013-04-28 MED ORDER — METOPROLOL TARTRATE 25 MG PO TABS: 25.0000 mg | ORAL_TABLET | Freq: Two times a day (BID) | ORAL | Status: DC

## 2013-04-28 NOTE — Assessment & Plan Note (Signed)
[name] presents today for followup visit. I met her approximately 4 years ago. She presented to the hospital with episodes of chest pain and an abnormal EKG. Focus of arrival EKG and chest pains we performed a cardiac catheterization which revealed smooth and normal coronary arteries. She was found to have a mild dynamic left ventricular outflow tract obstruction.  She was treated with metoprolol and did quite well. She was lost to followup and did not get her medications refilled. She now presents approximately 6 months after running out of her medications.  Her EKG still shows T-wave inversions in the inferior and lateral leads. This is somewhat worse than her previous EKG.  This  can probably be explained by her LVOT obstruction and lack of medicines. She's not had any episodes of chest pains.  Will restart her metoprolol 25 mg twice a day.  We'll get an echocardiogram in approximately one week.

## 2013-04-28 NOTE — Progress Notes (Signed)
[name] Date of Birth  August 06, 1961       [facility] Office [address], [address]  [address], [address] [place], [place]  [address]   [place], [place]  [address] [phone]     [phone]   Fax  [phone]    Fax [phone]  Problem List: 1. HTN 2. Palpitations 3. Neck surgery 4. Peripheral neuropathy 5. Chest pain 6. Dynamic LVOT obstruction  CONCLUSIONS: 1. Smooth and normal coronary arteries. 2. Hyperdynamic left ventricular systolic function.  She most likely     has a dynamic left ventricular outflow tract gradient.  She will     probably benefit from beta-blockers and calcium channel blockers.      History of Present Illness:  [name] is a 52 yo with hx of HTN and palpitations.  We had her on metoprolol.  She ran out of her metoprolol 6 months ago.  She has had some palpitations.   She stopped smoking but re-started again.    She is feeling well now.  Walking on occasion. She sees Yolonda Kida for her neuropathy.    She has occasional chest discomfort - last only a second or so.  Typically occurs after she twists her torso.        Current Outpatient Prescriptions on File Prior to Visit  Medication Sig Dispense Refill  . citalopram (CELEXA) 40 MG tablet Take 1 tablet (40 mg total) by mouth daily.  90 tablet  1  . diazepam (VALIUM) 5 MG tablet Take 1 tablet (5 mg total) by mouth every 8 (eight) hours as needed.  90 tablet  5  . L-Methylfolate-B6-B12 (METANX PO) Take by mouth daily.        Marland Kitchen oxyCODONE-acetaminophen (PERCOCET/ROXICET) 5-325 MG per tablet Take 1 tablet by mouth 3 (three) times daily as needed (Take only if needed.Do not exceed 3 tablets daily).  90 tablet  0   No current facility-administered medications on file prior to visit.    Allergies  Allergen Reactions  . Neurontin [Gabapentin]     Past Medical History  Diagnosis Date  . Chest pain     Normal heart catherization  . Diastolic  dysfunction     with possible mild LVOT gradient  . Hypertension   . Back injury     Chronic  . Former cigarette smoker   . Deafness     Left ear  . Degenerative joint disease     with neck surgery  . Gestational diabetes   . Pneumothorax     Past Surgical History  Procedure Laterality Date  . Neck surgery    . Other surgical history      History of cervical and lumbar disk surgery  . Cardiac catheterization      Ejection Fraction 65-70%    History  Smoking status  . Current Every Day Smoker  Smokeless tobacco  . Not on file    History  Alcohol Use No    Family History  Problem Relation Age of Onset  . Coronary artery disease Father     Reviw of Systems:  Reviewed in the HPI.  All other systems are negative.  Physical Exam: Blood pressure 143/86, pulse 84, height 5\' 5"  (1.651 m), weight 136 lb 9.6 oz (61.961 kg). General: Well developed, well nourished, in no acute distress.  Head: Normocephalic, atraumatic, sclera non-icteric, mucus membranes are moist,   Neck: Supple. Carotids are 2 + without bruits.  No JVD   Lungs: Clear   Heart: RR, soft systolic murmur  Abdomen: Soft, non-tender, non-distended with normal bowel sounds.  Msk:  Strength and tone are normal   Extremities: No clubbing or cyanosis. No edema.  Distal pedal pulses are 2+ and equal    Neuro: CN II - XII intact.  Alert and oriented X 3.   Psych:  Normal   ECG: Sept. 5, 2014:  NSR at 84.  ST / T wave abnormality in the inferior and lateral leads.  Assessment / Plan:

## 2013-04-28 NOTE — Patient Instructions (Addendum)
Your physician has recommended you make the following change in your medication:   Start metoprolol 25 mg twice daily 12 hours apart.  Your physician wants you to follow-up in: 6 months  You will receive a reminder letter in the mail two months in advance. If you don't receive a letter, please call our office to schedule the follow-up appointment.  Your physician has requested that you have an echocardiogram in 1 week . Echocardiography is a painless test that uses sound waves to create images of your heart. It provides your doctor with information about the size and shape of your heart and how well your heart's chambers and valves are working. This procedure takes approximately one hour. There are no restrictions for this procedure.

## 2013-05-05 ENCOUNTER — Ambulatory Visit (HOSPITAL_COMMUNITY): Payer: BC Managed Care – PPO | Attending: Cardiovascular Disease | Admitting: Radiology

## 2013-05-05 DIAGNOSIS — I421 Obstructive hypertrophic cardiomyopathy: Secondary | ICD-10-CM

## 2013-05-05 DIAGNOSIS — F17200 Nicotine dependence, unspecified, uncomplicated: Secondary | ICD-10-CM | POA: Insufficient documentation

## 2013-05-05 DIAGNOSIS — R079 Chest pain, unspecified: Secondary | ICD-10-CM

## 2013-05-05 DIAGNOSIS — G609 Hereditary and idiopathic neuropathy, unspecified: Secondary | ICD-10-CM | POA: Insufficient documentation

## 2013-05-05 DIAGNOSIS — R072 Precordial pain: Secondary | ICD-10-CM

## 2013-05-05 DIAGNOSIS — R002 Palpitations: Secondary | ICD-10-CM

## 2013-05-05 DIAGNOSIS — I079 Rheumatic tricuspid valve disease, unspecified: Secondary | ICD-10-CM | POA: Insufficient documentation

## 2013-05-05 NOTE — Progress Notes (Signed)
Echocardiogram performed.  

## 2013-05-08 ENCOUNTER — Telehealth: Payer: Self-pay | Admitting: Cardiovascular Disease

## 2013-05-08 NOTE — Telephone Encounter (Signed)
Echo reviewed.

## 2013-05-08 NOTE — Telephone Encounter (Signed)
New problem   Pt returning your phone call. Please call pt

## 2013-05-16 ENCOUNTER — Encounter: Payer: Self-pay | Admitting: Neurology

## 2013-05-17 ENCOUNTER — Ambulatory Visit (INDEPENDENT_AMBULATORY_CARE_PROVIDER_SITE_OTHER): Payer: BC Managed Care – PPO | Admitting: Neurology

## 2013-05-17 ENCOUNTER — Encounter: Payer: Self-pay | Admitting: Neurology

## 2013-05-17 VITALS — BP 154/97 | HR 96 | Resp 18 | Ht 67.0 in | Wt 137.0 lb

## 2013-05-17 DIAGNOSIS — IMO0001 Reserved for inherently not codable concepts without codable children: Secondary | ICD-10-CM

## 2013-05-17 DIAGNOSIS — M542 Cervicalgia: Secondary | ICD-10-CM

## 2013-05-17 DIAGNOSIS — F17200 Nicotine dependence, unspecified, uncomplicated: Secondary | ICD-10-CM

## 2013-05-17 DIAGNOSIS — R292 Abnormal reflex: Secondary | ICD-10-CM

## 2013-05-17 DIAGNOSIS — R208 Other disturbances of skin sensation: Secondary | ICD-10-CM | POA: Insufficient documentation

## 2013-05-17 DIAGNOSIS — R209 Unspecified disturbances of skin sensation: Secondary | ICD-10-CM

## 2013-05-17 DIAGNOSIS — F17209 Nicotine dependence, unspecified, with unspecified nicotine-induced disorders: Secondary | ICD-10-CM

## 2013-05-17 MED ORDER — CITALOPRAM HYDROBROMIDE 40 MG PO TABS: 40.0000 mg | ORAL_TABLET | Freq: Every day | ORAL | Status: DC

## 2013-05-17 MED ORDER — DIAZEPAM 5 MG PO TABS: 10.0000 mg | ORAL_TABLET | Freq: Three times a day (TID) | ORAL | Status: DC | PRN

## 2013-05-17 MED ORDER — OXYCODONE-ACETAMINOPHEN 5-325 MG PO TABS: 1.0000 | ORAL_TABLET | Freq: Three times a day (TID) | ORAL | Status: DC | PRN

## 2013-05-17 NOTE — Addendum Note (Signed)
Addended by: Melvyn Novas on: 05/17/2013 02:42 PM   Modules accepted: Orders

## 2013-05-17 NOTE — Progress Notes (Signed)
[facility]  Provider:  Melvyn Novas, M D  Referring Provider: No ref. provider found Primary Care Physician:  Dr Elease Hashimoto, Dr Thyra Breed.    HPI:  [name] is a 52 y.o. female  Is seen here as a referral   from Dr. Vear Clock, pain management .  Associates about 4 years now she has been a widow diagnosed by Dr. Loraine Leriche... possible reflex sympathetic dystrophy. 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  The neck surgery C3-4 was performed in 2007  by Dr. Gerlene Fee. There has always been a restricted range of motion after her surgery to the  left arm .She had physical therapy and continued home  Exercises,noting some degree of improvement. In September 2013 she reported that she developed more and more cramping she had also dropped trouble was cold and heat perception in the left hand and antebrachial. She has noticed some tremors, first onset in the left hand.  The patient now returns stating that she has had abnormal sensations to her right arm and more neck tenderness and spasms- almost a crunching  spasm paraspinally. The patient had one fall 3 years ago, non since. She remains in pain and sometimes anxious. She developed about 4 years ago an intravenous blood clot in the left arm.   She sleeps with a pillow on either side, not supine. Has been sleeping 5-6 hours nightly. Her husband noted ongoing kicking. Naps as needed in daytime.     The patient has received narcotic medication through this office and has signed a narcotic contract. I have made multiple attempts to refer her back to her pain management clinic.She has not been able to decrease her dose, but has not asked for early refills or increases , either. She is on only one pain medication and on Valium.   She takes Percocet 3 times daily.   Review of Systems: Out of a complete 14 system review, the  patient complains of only the following symptoms, and all other reviewed systems are negative.  She has been taking   Valium - some relief of spasticity but has also treated her anxiety and nervousness. She drinks still alcohol, 2-3 glasses at dinner.  She is an active smoker.  She feels tired and in general pain.   Refill for 90 days- patient has been compliant , did not exceed pain medication prescribed, no early refills.      History   Social History  . Marital Status: Married    Spouse Name: Reuel Boom    Number of Children: 2  . Years of Education: 12   Occupational History  .      homemaker   Social History Main Topics  . Smoking status: Current Every Day Smoker  . Smokeless tobacco: Never Used     Comment: 1/2 pack daily,trying to quit  . Alcohol Use: Yes     Comment: consume 3 glasses of wine on weekends  . Drug Use: No  . Sexual Activity: Not on file   Other Topics Concern  . Not on file   Social History Narrative  . No narrative on file    Family History  Problem Relation Age of Onset  . Coronary artery disease Father   . Parkinson's disease Mother   . Cancer Sister     breast  . Diabetes Sister   . Diabetes Brother  Past Medical History  Diagnosis Date  . Chest pain     Normal heart catherization  . Diastolic dysfunction     with possible mild LVOT gradient  . Hypertension   . Back injury     Chronic  . Former cigarette smoker   . Deafness     Left ear  . Degenerative joint disease     with neck surgery  . Gestational diabetes   . Pneumothorax   . Pain     chronic   . Cardiomyopathy     hypertension controlled-helped with edema-Dr Nahser  . Headache(784.0)     migraines  . Fibromyalgia   . Leg cramps   . Respiratory disorder   . Depression     Past Surgical History  Procedure Laterality Date  . Neck surgery    . Other surgical history      History of cervical and lumbar disk surgery  . Cardiac catheterization      Ejection  Fraction 65-70%  . 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 Polypectomies      tubes  . Cervical fusion      C5-6    Current Outpatient Prescriptions  Medication Sig Dispense Refill  . citalopram (CELEXA) 40 MG tablet Take 1 tablet (40 mg total) by mouth daily.  90 tablet  1  . diazepam (VALIUM) 5 MG tablet Take 1 tablet (5 mg total) by mouth every 8 (eight) hours as needed.  90 tablet  5  . metoprolol tartrate (LOPRESSOR) 25 MG tablet Take 1 tablet (25 mg total) by mouth 2 (two) times daily.  180 tablet  3  . oxyCODONE-acetaminophen (PERCOCET/ROXICET) 5-325 MG per tablet Take 1 tablet by mouth 3 (three) times daily as needed (Take only if needed.Do not exceed 3 tablets daily).  90 tablet  0   No current facility-administered medications for this visit.    Allergies as of 05/17/2013 - Review Complete 05/17/2013  Allergen Reaction Noted  . Endocet [oxycodone-acetaminophen]  05/16/2013  . Neurontin [gabapentin]  05/16/2011    Vitals: BP 154/97  Pulse 96  Resp 18  Ht 5\' 7"  (1.702 m)  Wt 137 lb (62.143 kg)  BMI 21.45 kg/m2 Last Weight:  Wt Readings from Last 1 Encounters:  05/17/13 137 lb (62.143 kg)   Last Height:   Ht Readings from Last 1 Encounters:  05/17/13 5\' 7"  (1.702 m)    Physical exam:  General: The patient is awake, alert and appears  in acute distress. The patient is well groomed. Head: Normocephalic, atraumatic. Facial  Butterfly rash, erythema.  Neck is supple. Mallampati 3  neck circumference: 14.inches, no nasal airflow restriction, TMJ pain in the right.  Cardiovascular:  Regular rate and rhythm, without  murmurs or carotid bruit, and without distended neck veins. Respiratory: Lungs are clear to auscultation. Skin:  Without evidence of edema, or rash- she has petechiae over the left antebrachium ( the dog stepped on me )  Neurologic exam : The patient is awake and alert, oriented to place and time.  Memory subjective   described as intact. There is a normal attention span & concentration ability.  Speech is fluent without  dysarthria, dysphonia or aphasia. Mood and affect are appropriate.  Cranial nerves: Pupils are equal and briskly reactive to light. Funduscopic exam without  evidence of pallor or edema. Extraocular movements  in vertical and horizontal planes intact and without nystagmus. Visual fields by  finger perimetry are intact. Hearing decreased to air and bone on the left ear. Facial sensation intact to fine touch.  Facial motor strength is symmetric and tongue and uvula move midline.  Motor exam: decreased grip strength, mucles mass appears decreased, Tremor in both hands, hyperreflexia.     Sensory:  Fine touch, pinprick and vibration were tested in all extremities.  Proprioception is normal. Hyperesthesia to fine touch and tenderness to palpation.   Coordination: Rapid alternating movements in the fingers/hands is tested and normal.  Finger-to-nose maneuver tested and normal with evidence of tremor in action .  Gait and station: Patient walks without assistive device. Strength within normal limits.  Stance is stable and normal. Tandem gait is ataxic , unfragmented. Romberg testing is positive. Deep tendon reflexes: in the  upper and lower extremities are  brisk - hyperreflexic , symmetric .  Babinski maneuver response is  Downgoing.   Assessment:  After physical and neurologic examination, review of laboratory studies, imaging, neurophysiology testing and pre-existing records, assessment is that of a patient with a chronic Pain syndrome and High abuse potential for narcotic pain medication. She experienced less and less relief from the medications, and progessively more tremor,  Allodynia and  Hyperreflexia. The patient needs to stop smoking and drinking.   Plan:  Treatment plan and additional workup : MRI cervical spine and brain ordered. Smoking cessation ordered.  Pain management clinic  needed. referal to either Dr Vear Clock or Dr. At pain clinic in [facility] Marland Kitchen  Psychiatry / psychology counseling suggested  For living in pain- improve pain tolerance.

## 2013-05-17 NOTE — Patient Instructions (Signed)
Place neck pain patient instructions here.   Acetaminophen; Oxycodone tablets What is this medicine? ACETAMINOPHEN; OXYCODONE (a set a MEE noe fen; ox i KOE done) is a pain reliever. It is used to treat mild to moderate pain. This medicine may be used for other purposes; ask your health care provider or pharmacist if you have questions. What should I tell my health care provider before I take this medicine? They need to know if you have any of these conditions: -brain tumor -Crohn's disease, inflammatory bowel disease, or ulcerative colitis -drink more than 3 alcohol containing drinks per day -drug abuse or addiction -head injury -heart or circulation problems -kidney disease or problems going to the bathroom -liver disease -lung disease, asthma, or breathing problems -an unusual or allergic reaction to acetaminophen, oxycodone, other opioid analgesics, other medicines, foods, dyes, or preservatives -pregnant or trying to get pregnant -breast-feeding How should I use this medicine? Take this medicine by mouth with a full glass of water. Follow the directions on the prescription label. Take your medicine at regular intervals. Do not take your medicine more often than directed. Talk to your pediatrician regarding the use of this medicine in children. Special care may be needed. Patients over 36 years old may have a stronger reaction and need a smaller dose. Overdosage: If you think you have taken too much of this medicine contact a poison control center or emergency room at once. NOTE: This medicine is only for you. Do not share this medicine with others. What if I miss a dose? If you miss a dose, take it as soon as you can. If it is almost time for your next dose, take only that dose. Do not take double or extra doses. What may interact with this medicine? -alcohol -antihistamines -barbiturates like amobarbital, butalbital, butabarbital, methohexital, pentobarbital, phenobarbital,  thiopental, and secobarbital -benztropine -drugs for bladder problems like solifenacin, trospium, oxybutynin, tolterodine, hyoscyamine, and methscopolamine -drugs for breathing problems like ipratropium and tiotropium -drugs for certain stomach or intestine problems like propantheline, homatropine methylbromide, glycopyrrolate, atropine, belladonna, and dicyclomine -general anesthetics like etomidate, ketamine, nitrous oxide, propofol, desflurane, enflurane, halothane, isoflurane, and sevoflurane -medicines for depression, anxiety, or psychotic disturbances -medicines for sleep -muscle relaxants -naltrexone -narcotic medicines (opiates) for pain -phenothiazines like perphenazine, thioridazine, chlorpromazine, mesoridazine, fluphenazine, prochlorperazine, promazine, and trifluoperazine -scopolamine -tramadol -trihexyphenidyl This list may not describe all possible interactions. Give your health care provider a list of all the medicines, herbs, non-prescription drugs, or dietary supplements you use. Also tell them if you smoke, drink alcohol, or use illegal drugs. Some items may interact with your medicine. What should I watch for while using this medicine? Tell your doctor or health care professional if your pain does not go away, if it gets worse, or if you have new or a different type of pain. You may develop tolerance to the medicine. Tolerance means that you will need a higher dose of the medication for pain relief. Tolerance is normal and is expected if you take this medicine for a long time. Do not suddenly stop taking your medicine because you may develop a severe reaction. Your body becomes used to the medicine. This does NOT mean you are addicted. Addiction is a behavior related to getting and using a drug for a non-medical reason. If you have pain, you have a medical reason to take pain medicine. Your doctor will tell you how much medicine to take. If your doctor wants you to stop the  medicine, the dose will  be slowly lowered over time to avoid any side effects. You may get drowsy or dizzy. Do not drive, use machinery, or do anything that needs mental alertness until you know how this medicine affects you. Do not stand or sit up quickly, especially if you are an older patient. This reduces the risk of dizzy or fainting spells. Alcohol may interfere with the effect of this medicine. Avoid alcoholic drinks. There are different types of narcotic medicines (opiates) for pain. If you take more than one type at the same time, you may have more side effects. Give your health care provider a list of all medicines you use. Your doctor will tell you how much medicine to take. Do not take more medicine than directed. Call emergency for help if you have problems breathing. The medicine will cause constipation. Try to have a bowel movement at least every 2 to 3 days. If you do not have a bowel movement for 3 days, call your doctor or health care professional. Do not take Tylenol (acetaminophen) or medicines that have acetaminophen with this medicine. Too much acetaminophen can be very dangerous. Many nonprescription medicines contain acetaminophen. Always read the labels carefully to avoid taking more acetaminophen. What side effects may I notice from receiving this medicine? Side effects that you should report to your doctor or health care professional as soon as possible: -allergic reactions like skin rash, itching or hives, swelling of the face, lips, or tongue -breathing difficulties, wheezing -confusion -light headedness or fainting spells -severe stomach pain -yellowing of the skin or the whites of the eyes Side effects that usually do not require medical attention (report to your doctor or health care professional if they continue or are bothersome): -dizziness -drowsiness -nausea -vomiting This list may not describe all possible side effects. Call your doctor for medical advice about  side effects. You may report side effects to FDA at 1-800-FDA-1088. Where should I keep my medicine? Keep out of the reach of children. This medicine can be abused. Keep your medicine in a safe place to protect it from theft. Do not share this medicine with anyone. Selling or giving away this medicine is dangerous and against the law. Store at room temperature between 20 and 25 degrees C (68 and 77 degrees F). Keep container tightly closed. Protect from light.  Discard unused medicine and used packaging carefully. Pets and children can be harmed if they find used or lost packages. Flush any unused medicines down the toilet. Do not use the medicine after the expiration date. NOTE: This sheet is a summary. It may not cover all possible information. If you have questions about this medicine, talk to your doctor, pharmacist, or health care provider.  2013, [employer]. (04/21/2011 4:13:41 PM)

## 2013-05-19 ENCOUNTER — Other Ambulatory Visit (INDEPENDENT_AMBULATORY_CARE_PROVIDER_SITE_OTHER): Payer: Self-pay

## 2013-05-19 DIAGNOSIS — Z0289 Encounter for other administrative examinations: Secondary | ICD-10-CM

## 2013-05-20 LAB — C-REACTIVE PROTEIN: CRP: 1 mg/L (ref 0.0–4.9)

## 2013-05-20 LAB — CK TOTAL AND CKMB (NOT AT ARMC)
CK-MB Index: 2.2 ng/mL (ref 0.0–2.9)
Total CK: 50 U/L (ref 24–173)

## 2013-05-20 LAB — RHEUMATOID FACTOR: Rhuematoid fact SerPl-aCnc: 10.1 IU/mL (ref 0.0–13.9)

## 2013-05-24 ENCOUNTER — Telehealth: Payer: Self-pay

## 2013-05-24 NOTE — Telephone Encounter (Signed)
Patient called regarding her Rx for Percocet.  She said although it was written for 90 days, the pharmacy would only fill a 30 day supply, therefore she will need her next Rx written for 30 days rather than 90.  She is not due for a refill yet, and will call back when the Rx is due.  As well, they would only fill 1 month of Diazepam.  She said she is uncertain if the pharmacy will need a new Rx or not.  Since this is not a CII and it had refills, typically the pharmacy can fill the Rx.  She will have the pharmacy call us regarding that Rx if she has any issues filling it next time it's due.  No action is needed at this time, patient just wanted [place] to be aware of the situation.

## 2013-05-31 ENCOUNTER — Ambulatory Visit (INDEPENDENT_AMBULATORY_CARE_PROVIDER_SITE_OTHER): Payer: BC Managed Care – PPO

## 2013-05-31 ENCOUNTER — Other Ambulatory Visit: Payer: Self-pay | Admitting: Neurology

## 2013-05-31 DIAGNOSIS — M542 Cervicalgia: Secondary | ICD-10-CM

## 2013-05-31 DIAGNOSIS — IMO0001 Reserved for inherently not codable concepts without codable children: Secondary | ICD-10-CM

## 2013-05-31 DIAGNOSIS — R208 Other disturbances of skin sensation: Secondary | ICD-10-CM

## 2013-05-31 DIAGNOSIS — R209 Unspecified disturbances of skin sensation: Secondary | ICD-10-CM

## 2013-05-31 DIAGNOSIS — R292 Abnormal reflex: Secondary | ICD-10-CM

## 2013-05-31 MED ORDER — DIAZEPAM 5 MG PO TABS: ORAL_TABLET | ORAL | Status: DC

## 2013-05-31 NOTE — Telephone Encounter (Signed)
Pt here for prescription for Valium.  I called pharmacy and spoke to [facility] and she relayed that pt received 12 tabs last time, and has 258 tabs left on her prescription.  Pt informed by Arman Bogus that pt can get at the pharmacy.  No need for written prescription.

## 2013-06-01 NOTE — Progress Notes (Signed)
Quick Note:  I called pt and relayed the results of labs (normal). Referral to PM in place to Dr. Vear Clock. Pt to call back as needed. ______

## 2013-06-05 ENCOUNTER — Telehealth: Payer: Self-pay | Admitting: Neurology

## 2013-06-06 NOTE — Telephone Encounter (Signed)
Pt wants MRI results, please advise. °

## 2013-06-14 ENCOUNTER — Telehealth: Payer: Self-pay | Admitting: Neurology

## 2013-06-14 ENCOUNTER — Other Ambulatory Visit: Payer: Self-pay

## 2013-06-14 DIAGNOSIS — G90512 Complex regional pain syndrome I of left upper limb: Secondary | ICD-10-CM

## 2013-06-14 DIAGNOSIS — R292 Abnormal reflex: Secondary | ICD-10-CM

## 2013-06-14 DIAGNOSIS — R208 Other disturbances of skin sensation: Secondary | ICD-10-CM

## 2013-06-14 DIAGNOSIS — M4802 Spinal stenosis, cervical region: Secondary | ICD-10-CM

## 2013-06-14 DIAGNOSIS — M542 Cervicalgia: Secondary | ICD-10-CM

## 2013-06-14 DIAGNOSIS — IMO0001 Reserved for inherently not codable concepts without codable children: Secondary | ICD-10-CM

## 2013-06-14 MED ORDER — OXYCODONE-ACETAMINOPHEN 5-325 MG PO TABS: 1.0000 | ORAL_TABLET | Freq: Three times a day (TID) | ORAL | Status: DC | PRN

## 2013-06-14 NOTE — Telephone Encounter (Signed)
Patient called requesting a refill on Percocet.  She would like to pick up the Rx when it's ready.  Call back number [phone].

## 2013-06-14 NOTE — Telephone Encounter (Signed)
Message copied by [facility] on Wed Jun 14, 2013  1:15 PM ------      Message from: [facility], CARMEN      Created: Tue Jun 13, 2013 10:16 AM       Very abnormal cervical spine MRI - can explain a whole lot of pain. Left neck ,  shoulder and arm.  Please call her and offer a copy-             She may want to consider a revisit with her neurosurgeon. !!! ------

## 2013-06-14 NOTE — Telephone Encounter (Signed)
I called and left patient a VM that we were calling to discuss the results of her MRI. Please call to review.

## 2013-06-14 NOTE — Telephone Encounter (Signed)
Dr. Vickey Huger can you call the patient with her MRI cervical results.  [phone]

## 2013-06-15 NOTE — Telephone Encounter (Signed)
patient would like to see Dr. Venetia Maxon .  Would she be able to see Dr. Murray Hodgkins for pain management. ?

## 2013-06-15 NOTE — Telephone Encounter (Signed)
Patient returned my call. We reviewed her MRI results. She understands that it is recommended that she follow up with neurosurgeon. She wants to look into finding another neurosurgeon and will let us know who it is so we can make the referral. I will leave a copy of the MRI at the front desk with her prescription (when it is ready) so she can have that.   Patient will have her husband try to register her for "My Chart"

## 2013-06-15 NOTE — Telephone Encounter (Signed)
Rx signed, ready for pick up.  I called the patient,  Got no answer.  Left message.  Also inquired about her pain clinic referral per Dr D.  (She would like to know if the patient has been in contact with pain center)  Asked her to call us back with status.

## 2013-07-07 NOTE — Telephone Encounter (Signed)
Patient would like a referral to Dr. Murray Hodgkins.  She also asked if the doctor would prescribe one more month of Percocet until she is able to get to pain doctor.  Please advise.

## 2013-07-10 ENCOUNTER — Telehealth: Payer: Self-pay | Admitting: Neurology

## 2013-07-10 MED ORDER — OXYCODONE-ACETAMINOPHEN 5-325 MG PO TABS: 1.0000 | ORAL_TABLET | Freq: Three times a day (TID) | ORAL | Status: DC | PRN

## 2013-07-10 NOTE — Telephone Encounter (Signed)
Called patient to let her know that her prescription was ready to be picked up, patient verbalized understanding. °

## 2013-07-17 ENCOUNTER — Telehealth: Payer: Self-pay | Admitting: Neurology

## 2013-07-17 NOTE — Telephone Encounter (Signed)
Called patient to get more information abut Physical Therapy, no answer,could not leave VM

## 2013-07-19 NOTE — Telephone Encounter (Signed)
Spoke with patient and she said that the PT may be from another physician, will contact them but she would not like to go to [facility]), would like to have referral sent to Dr Harriette Bouillon?)

## 2013-07-19 NOTE — Telephone Encounter (Signed)
What is the question, [facility] has called her - so if she wants PT she makes the appoitmnet., AND when was  her pain management referral processed and send?

## 2013-08-04 ENCOUNTER — Telehealth: Payer: Self-pay | Admitting: Neurology

## 2013-08-04 NOTE — Telephone Encounter (Signed)
Called patient and informed that referral to Dr Bartko-07/31/13, she verbalized understanding, she is also requesting refill on percocet and diazepam medication. Will she have to pick up?

## 2013-08-04 NOTE — Telephone Encounter (Signed)
Oxycodone was last written for 30 day supply on 11/17, it is too early to refill the Rx at this time.  Diazepam was written for a 90 day supply on 10/08, as well this is too soon.  I called the patient.  She said she realizes it is early, she just wanted [place] to know she will need refills.  Says she spoke with Ammie about her referral and now has to schedule an appt with Dr Lyda Perone, as it has finally gone through.

## 2013-08-07 ENCOUNTER — Other Ambulatory Visit: Payer: Self-pay

## 2013-08-07 DIAGNOSIS — G90512 Complex regional pain syndrome I of left upper limb: Secondary | ICD-10-CM

## 2013-08-07 DIAGNOSIS — IMO0001 Reserved for inherently not codable concepts without codable children: Secondary | ICD-10-CM

## 2013-08-07 DIAGNOSIS — R292 Abnormal reflex: Secondary | ICD-10-CM

## 2013-08-07 DIAGNOSIS — M4802 Spinal stenosis, cervical region: Secondary | ICD-10-CM

## 2013-08-07 DIAGNOSIS — M542 Cervicalgia: Secondary | ICD-10-CM

## 2013-08-07 DIAGNOSIS — R208 Other disturbances of skin sensation: Secondary | ICD-10-CM

## 2013-08-07 MED ORDER — OXYCODONE-ACETAMINOPHEN 5-325 MG PO TABS: 1.0000 | ORAL_TABLET | Freq: Three times a day (TID) | ORAL | Status: DC | PRN

## 2013-08-07 NOTE — Telephone Encounter (Signed)
Copy of Rx with instructions re: future refills has been placed in December 2014 Rx file.

## 2013-08-07 NOTE — Telephone Encounter (Signed)
I called patient and left VM, Rx up front for pick up after 3:00 p.m. Today. Also, this is the last Rx for this medication that we will fill. All future refills will have to go through the pain clinic.

## 2013-08-07 NOTE — Telephone Encounter (Signed)
Patient would like to get a refill on Oxycodone.  Patient says she was told by the clinic that her referral was just approved on 12/08, so she is going to contact them for an appt.  She would like to get get a refill until she can be seen.  Please call patient when Rx is ready for pick up [phone].

## 2013-08-28 ENCOUNTER — Other Ambulatory Visit: Payer: Self-pay

## 2013-08-28 DIAGNOSIS — R208 Other disturbances of skin sensation: Secondary | ICD-10-CM

## 2013-08-28 DIAGNOSIS — IMO0001 Reserved for inherently not codable concepts without codable children: Secondary | ICD-10-CM

## 2013-08-28 DIAGNOSIS — R292 Abnormal reflex: Secondary | ICD-10-CM

## 2013-08-28 DIAGNOSIS — M542 Cervicalgia: Secondary | ICD-10-CM

## 2013-08-28 MED ORDER — DIAZEPAM 5 MG PO TABS: ORAL_TABLET | ORAL | Status: DC

## 2013-09-08 ENCOUNTER — Telehealth: Payer: Self-pay | Admitting: Neurology

## 2013-09-08 NOTE — Telephone Encounter (Signed)
Forwarded to [place] in referrals.

## 2013-09-08 NOTE — Telephone Encounter (Signed)
Patient has made several calls requesting  referral to Dr. Artelia LarocheBartkow fax [phone] - [facility]. Please contact patient when completed or with any questions.

## 2013-09-12 ENCOUNTER — Other Ambulatory Visit: Payer: Self-pay

## 2013-09-12 DIAGNOSIS — M542 Cervicalgia: Secondary | ICD-10-CM

## 2013-09-12 DIAGNOSIS — IMO0001 Reserved for inherently not codable concepts without codable children: Secondary | ICD-10-CM

## 2013-09-12 DIAGNOSIS — R292 Abnormal reflex: Secondary | ICD-10-CM

## 2013-09-12 DIAGNOSIS — M4802 Spinal stenosis, cervical region: Secondary | ICD-10-CM

## 2013-09-12 DIAGNOSIS — R208 Other disturbances of skin sensation: Secondary | ICD-10-CM

## 2013-09-12 MED ORDER — OXYCODONE-ACETAMINOPHEN 5-325 MG PO TABS: 1.0000 | ORAL_TABLET | Freq: Three times a day (TID) | ORAL | Status: DC | PRN

## 2013-09-12 NOTE — Telephone Encounter (Signed)
Patent called saying she will not be able to get an appt at the pain clinic until Feb.  She would like to know if she can get another refill on pain meds until she is able to be seen.  Call back number [phone]

## 2013-09-13 NOTE — Telephone Encounter (Signed)
Called patient to inform her that her Rx was ready to be picked up at the front desk. I advised the patient that if she has any other problems, questions or concerns to call the office. Patient verbalized understanding. °

## 2013-10-13 ENCOUNTER — Other Ambulatory Visit: Payer: Self-pay | Admitting: Neurology

## 2013-10-13 DIAGNOSIS — IMO0001 Reserved for inherently not codable concepts without codable children: Secondary | ICD-10-CM

## 2013-10-13 DIAGNOSIS — R292 Abnormal reflex: Secondary | ICD-10-CM

## 2013-10-13 DIAGNOSIS — M542 Cervicalgia: Secondary | ICD-10-CM

## 2013-10-13 DIAGNOSIS — M4802 Spinal stenosis, cervical region: Secondary | ICD-10-CM

## 2013-10-13 DIAGNOSIS — R208 Other disturbances of skin sensation: Secondary | ICD-10-CM

## 2013-10-13 MED ORDER — OXYCODONE-ACETAMINOPHEN 5-325 MG PO TABS: 1.0000 | ORAL_TABLET | Freq: Three times a day (TID) | ORAL | Status: DC | PRN

## 2013-10-13 NOTE — Telephone Encounter (Signed)
Pt called and asked if she could have a refill on her Percocet.  She states she had an appointment with Pain Management, and she went, however once she was there they told her they would not be able to see her until they received medical records from another one of her doctors.  She also fell on the ice earlier this week.  Please call and advise.  Thank you.

## 2013-10-13 NOTE — Telephone Encounter (Signed)
Called patient to inform her that her Rx was ready to be picked up and if she has any other problems, questions or concerns to call the office. Patient verbalized understanding. °

## 2013-10-31 ENCOUNTER — Emergency Department (HOSPITAL_COMMUNITY)
Admission: EM | Admit: 2013-10-31 | Discharge: 2013-10-31 | Disposition: A | Discharge status: Home / Self Care | Payer: BC Managed Care – PPO | Attending: Emergency Medicine | Admitting: Emergency Medicine

## 2013-10-31 ENCOUNTER — Encounter (HOSPITAL_COMMUNITY): Payer: Self-pay | Admitting: Emergency Medicine

## 2013-10-31 ENCOUNTER — Emergency Department (HOSPITAL_COMMUNITY): Payer: BC Managed Care – PPO

## 2013-10-31 DIAGNOSIS — R0602 Shortness of breath: Secondary | ICD-10-CM | POA: Insufficient documentation

## 2013-10-31 DIAGNOSIS — IMO0001 Reserved for inherently not codable concepts without codable children: Secondary | ICD-10-CM | POA: Insufficient documentation

## 2013-10-31 DIAGNOSIS — S2341XA Sprain of ribs, initial encounter: Secondary | ICD-10-CM | POA: Insufficient documentation

## 2013-10-31 DIAGNOSIS — R059 Cough, unspecified: Secondary | ICD-10-CM | POA: Insufficient documentation

## 2013-10-31 DIAGNOSIS — F3289 Other specified depressive episodes: Secondary | ICD-10-CM | POA: Insufficient documentation

## 2013-10-31 DIAGNOSIS — Z9889 Other specified postprocedural states: Secondary | ICD-10-CM | POA: Insufficient documentation

## 2013-10-31 DIAGNOSIS — R05 Cough: Secondary | ICD-10-CM | POA: Insufficient documentation

## 2013-10-31 DIAGNOSIS — M25512 Pain in left shoulder: Secondary | ICD-10-CM

## 2013-10-31 DIAGNOSIS — Y92009 Unspecified place in unspecified non-institutional (private) residence as the place of occurrence of the external cause: Secondary | ICD-10-CM | POA: Insufficient documentation

## 2013-10-31 DIAGNOSIS — Z981 Arthrodesis status: Secondary | ICD-10-CM | POA: Insufficient documentation

## 2013-10-31 DIAGNOSIS — I10 Essential (primary) hypertension: Secondary | ICD-10-CM | POA: Insufficient documentation

## 2013-10-31 DIAGNOSIS — H9190 Unspecified hearing loss, unspecified ear: Secondary | ICD-10-CM | POA: Insufficient documentation

## 2013-10-31 DIAGNOSIS — F17200 Nicotine dependence, unspecified, uncomplicated: Secondary | ICD-10-CM | POA: Insufficient documentation

## 2013-10-31 DIAGNOSIS — M1990 Unspecified osteoarthritis, unspecified site: Secondary | ICD-10-CM | POA: Insufficient documentation

## 2013-10-31 DIAGNOSIS — Y9389 Activity, other specified: Secondary | ICD-10-CM | POA: Insufficient documentation

## 2013-10-31 DIAGNOSIS — S4980XA Other specified injuries of shoulder and upper arm, unspecified arm, initial encounter: Secondary | ICD-10-CM | POA: Insufficient documentation

## 2013-10-31 DIAGNOSIS — Z87820 Personal history of traumatic brain injury: Secondary | ICD-10-CM | POA: Insufficient documentation

## 2013-10-31 DIAGNOSIS — R61 Generalized hyperhidrosis: Secondary | ICD-10-CM | POA: Insufficient documentation

## 2013-10-31 DIAGNOSIS — F329 Major depressive disorder, single episode, unspecified: Secondary | ICD-10-CM | POA: Insufficient documentation

## 2013-10-31 DIAGNOSIS — S29011A Strain of muscle and tendon of front wall of thorax, initial encounter: Secondary | ICD-10-CM

## 2013-10-31 DIAGNOSIS — X58XXXA Exposure to other specified factors, initial encounter: Secondary | ICD-10-CM | POA: Insufficient documentation

## 2013-10-31 DIAGNOSIS — Z8632 Personal history of gestational diabetes: Secondary | ICD-10-CM | POA: Insufficient documentation

## 2013-10-31 DIAGNOSIS — Z79899 Other long term (current) drug therapy: Secondary | ICD-10-CM | POA: Insufficient documentation

## 2013-10-31 DIAGNOSIS — R209 Unspecified disturbances of skin sensation: Secondary | ICD-10-CM | POA: Insufficient documentation

## 2013-10-31 DIAGNOSIS — Z8709 Personal history of other diseases of the respiratory system: Secondary | ICD-10-CM | POA: Insufficient documentation

## 2013-10-31 DIAGNOSIS — S46909A Unspecified injury of unspecified muscle, fascia and tendon at shoulder and upper arm level, unspecified arm, initial encounter: Secondary | ICD-10-CM | POA: Insufficient documentation

## 2013-10-31 HISTORY — DX: Other pulmonary collapse: J98.19

## 2013-10-31 LAB — BASIC METABOLIC PANEL
BUN: 11 mg/dL (ref 6–23)
CO2: 28 mEq/L (ref 19–32)
Calcium: 9.5 mg/dL (ref 8.4–10.5)
Chloride: 99 mEq/L (ref 96–112)
Creatinine, Ser: 0.62 mg/dL (ref 0.50–1.10)
GFR calc Af Amer: >90 mL/min (ref >90)
GFR calc non Af Amer: >90 mL/min (ref >90)
Glucose, Bld: 97 mg/dL (ref 70–99)
Potassium: 4.9 mEq/L (ref 3.7–5.3)
Sodium: 141 mEq/L (ref 137–147)

## 2013-10-31 LAB — CBC
HCT: 41.2 % (ref 36.0–46.0)
Hemoglobin: 14.9 g/dL (ref 12.0–15.0)
MCH: 38.3 pg — ABNORMAL HIGH (ref 26.0–34.0)
MCHC: 36.2 g/dL — ABNORMAL HIGH (ref 30.0–36.0)
MCV: 105.9 fL — ABNORMAL HIGH (ref 78.0–100.0)
Platelets: 339 10*3/uL (ref 150–400)
RBC: 3.89 MIL/uL (ref 3.87–5.11)
RDW: 12.9 % (ref 11.5–15.5)
WBC: 7.1 10*3/uL (ref 4.0–10.5)

## 2013-10-31 LAB — I-STAT TROPONIN, ED: Troponin i, poc: 0 ng/mL (ref 0.00–0.08)

## 2013-10-31 MED ORDER — OXYCODONE-ACETAMINOPHEN 5-325 MG PO TABS: 1.0000 | ORAL_TABLET | Freq: Four times a day (QID) | ORAL | Status: DC | PRN

## 2013-10-31 MED ORDER — NITROGLYCERIN 0.4 MG SL SUBL: 0.4000 mg | SUBLINGUAL_TABLET | SUBLINGUAL | Status: DC | PRN

## 2013-10-31 MED ORDER — OXYCODONE-ACETAMINOPHEN 5-325 MG PO TABS
2.0000 | ORAL_TABLET | Freq: Once | ORAL | Status: AC
  Administered 2013-10-31: 2 via ORAL
  Filled 2013-10-31: qty 2

## 2013-10-31 MED ORDER — MORPHINE SULFATE 4 MG/ML IJ SOLN
4.0000 mg | Freq: Once | INTRAMUSCULAR | Status: AC
  Administered 2013-10-31: 4 mg via INTRAMUSCULAR
  Filled 2013-10-31: qty 1

## 2013-10-31 MED ORDER — ONDANSETRON 4 MG PO TBDP
8.0000 mg | ORAL_TABLET | Freq: Once | ORAL | Status: AC
  Administered 2013-10-31: 8 mg via ORAL
  Filled 2013-10-31: qty 2

## 2013-10-31 MED ORDER — ASPIRIN 325 MG PO TABS
325.0000 mg | ORAL_TABLET | ORAL | Status: DC
  Filled 2013-10-31: qty 1

## 2013-10-31 NOTE — ED Notes (Signed)
Patients vital signs not taken during triage.

## 2013-10-31 NOTE — ED Notes (Signed)
Pt reports yesterday about 2pm she was sitting around and developed left axilla pain, left shoulder pain. Pt reports then she began having stabbing pains in the center of her chest. Pt took a nap. Pt states pain has been constant ever since. Pt reports SOB, sweating and clammy sensation. Pt resp even unlabored, skin pink, warm dry. Pt states she is in so much pain. Pt reports she took a percocet for her breakthrough pain.

## 2013-10-31 NOTE — Discharge Instructions (Signed)
Recommend followup with your primary care provider in your cardiologist regarding your pain. Also recommend ice to the affected area 3-4 times per day for at least 30 minutes each time. You may take Percocet as prescribed as needed for pain. Return if symptoms worsen.  Muscle Strain A muscle strain is an injury that occurs when a muscle is stretched beyond its normal length. Usually a small number of muscle fibers are torn when this happens. Muscle strain is rated in degrees. First-degree strains have the least amount of muscle fiber tearing and pain. Second-degree and third-degree strains have increasingly more tearing and pain.  Usually, recovery from muscle strain takes 1 2 weeks. Complete healing takes 5 6 weeks.  CAUSES  Muscle strain happens when a sudden, violent force placed on a muscle stretches it too far. This may occur with lifting, sports, or a fall.  RISK FACTORS Muscle strain is especially common in athletes.  SIGNS AND SYMPTOMS At the site of the muscle strain, there may be:  Pain.  Bruising.  Swelling.  Difficulty using the muscle due to pain or lack of normal function. DIAGNOSIS  Your health care provider will perform a physical exam and ask about your medical history. TREATMENT  Often, the best treatment for a muscle strain is resting, icing, and applying cold compresses to the injured area.  HOME CARE INSTRUCTIONS   Use the PRICE method of treatment to promote muscle healing during the first 2 3 days after your injury. The PRICE method involves:  Protecting the muscle from being injured again.  Restricting your activity and resting the injured body part.  Icing your injury. To do this, put ice in a plastic bag. Place a towel between your skin and the bag. Then, apply the ice and leave it on from 15 20 minutes each hour. After the third day, switch to moist heat packs.  Apply compression to the injured area with a splint or elastic bandage. Be careful not to wrap  it too tightly. This may interfere with blood circulation or increase swelling.  Elevate the injured body part above the level of your heart as often as you can.  Only take over-the-counter or prescription medicines for pain, discomfort, or fever as directed by your health care provider.  Warming up prior to exercise helps to prevent future muscle strains. SEEK MEDICAL CARE IF:   You have increasing pain or swelling in the injured area.  You have numbness, tingling, or a significant loss of strength in the injured area. MAKE SURE YOU:   Understand these instructions.  Will watch your condition.  Will get help right away if you are not doing well or get worse. Document Released: 08/10/2005 Document Revised: 05/31/2013 Document Reviewed: 03/09/2013 [facility] Patient Information 2014 [place] [place].

## 2013-10-31 NOTE — ED Provider Notes (Signed)
CSN: [id number]     Arrival date & time 10/31/13  1334 History   First MD Initiated Contact with Patient 10/31/13 1658     Chief Complaint  Patient presents with  . Shoulder Pain  . Chest Pain     (Consider location/radiation/quality/duration/timing/severity/associated sxs/prior Treatment) HPI Comments: Patient is a 53 year old female with a history of chest pain, hypertension, tobacco use, cardiomyopathy, pneumothorax x2, fibromyalgia, and C2 & C5 fusion who presents to the emergency department for left-sided chest pain. Patient states the chest pain started 24 hours ago after a day of rigorous yard work and has been constant since onset. Patient states that pain began as a "pricking" sensation and has progressed to a stabbing pain. Pain is present on patient's entire anterior and posterior left chest and radiates down her left arm. Symptoms associated with intermittent shortness of breath, "clammy hands", and paresthesias in her LUE. Patient denies any modifying factors of her symptoms. She has tried BenGay, icy hot patches, and heat pads without relief. Patient states that movement and deep breathing makes the pain worse. She denies associated fever, syncope or near syncope, hemoptysis, nausea or vomiting, numbness, extremity weakness, and leg swelling.   Cardiologist - Dr. Elease HashimotoNahser Neurologist - Dr. Vickey Hugerohmeier She has a hx of PTX x2 on right from bleb rupture; last occurrence 20 years ago.  Patient is a 53 y.o. female presenting with shoulder pain and chest pain. The history is provided by the patient. No language interpreter was used.  Shoulder Pain Associated symptoms include chest pain, coughing (Productive of green/black sputum) and diaphoresis. Pertinent negatives include no nausea, numbness, vomiting or weakness.  Chest Pain Associated symptoms: cough (Productive of green/black sputum), diaphoresis and shortness of breath   Associated symptoms: no back pain, no nausea, no numbness, not  vomiting and no weakness     Past Medical History  Diagnosis Date  . Chest pain     Normal heart catherization  . Diastolic dysfunction     with possible mild LVOT gradient  . Hypertension   . Back injury     Chronic  . Former cigarette smoker   . Deafness     Left ear  . Degenerative joint disease     with neck surgery  . Gestational diabetes   . Pneumothorax   . Pain     chronic   . Cardiomyopathy     hypertension controlled-helped with edema-Dr Nahser  . Headache(784.0)     migraines  . Fibromyalgia   . Leg cramps   . Respiratory disorder   . Depression   . Collapsed lung    Past Surgical History  Procedure Laterality Date  . Neck surgery    . Other surgical history      History of cervical and lumbar disk surgery  . Cardiac catheterization      Ejection Fraction 65-70%  . External ear surgery      tumor at age 53, 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  . Polypectomies      tubes  . Cervical fusion      C5-6   Family History  Problem Relation Age of Onset  . Coronary artery disease Father   . Parkinson's disease Mother   . Cancer Sister     breast  . Diabetes Sister   . Diabetes Brother    History  Substance Use Topics  . Smoking status: Current Every Day Smoker  . Smokeless tobacco: Never Used     Comment: 1/2 pack daily,trying to  quit  . Alcohol Use: Yes     Comment: consume 3 glasses of wine on weekends   OB History   Grav Para Term Preterm Abortions TAB SAB Ect Mult Living                  Review of Systems  Constitutional: Positive for diaphoresis.  Respiratory: Positive for cough (Productive of green/black sputum) and shortness of breath.   Cardiovascular: Positive for chest pain.  Gastrointestinal: Negative for nausea, vomiting and diarrhea.  Genitourinary: Negative for dysuria and hematuria.  Musculoskeletal: Negative for back pain.  Neurological: Negative for syncope, weakness and numbness.       +paresthesias LUE  All other systems reviewed  and are negative.     Allergies  Endocet and Neurontin  Home Medications   Current Outpatient Rx  Name  Route  Sig  Dispense  Refill  . acetaminophen (TYLENOL) 325 MG tablet   Oral   Take 650 mg by mouth every 6 (six) hours as needed for mild pain, fever or headache.         . citalopram (CELEXA) 40 MG tablet   Oral   Take 1 tablet (40 mg total) by mouth daily.   90 tablet   3   . diazepam (VALIUM) 5 MG tablet   Oral   Take 5 mg by mouth every 8 (eight) hours as needed for anxiety. Take one prn ,max three times a day.         . loperamide (IMODIUM A-D) 2 MG tablet   Oral   Take 2 mg by mouth 4 (four) times daily as needed for diarrhea or loose stools.         . Menthol, Topical Analgesic, (BIOFREEZE EX)   Apply externally   Apply 1 application topically 2 (two) times daily as needed (for pain).         . Menthol, Topical Analgesic, (ICY HOT EX)   Apply externally   Apply 1 patch topically daily as needed.         . metoprolol tartrate (LOPRESSOR) 25 MG tablet   Oral   Take 1 tablet (25 mg total) by mouth 2 (two) times daily.   180 tablet   3   . oxyCODONE-acetaminophen (PERCOCET/ROXICET) 5-325 MG per tablet   Oral   Take 1-2 tablets by mouth every 6 (six) hours as needed for moderate pain (Take only if needed.Do not exceed 3 tablets daily).   13 tablet   0    BP 122/87  Pulse 62  Temp(Src) 97.9 F (36.6 C) (Oral)  Resp 16  SpO2 96%  Physical Exam  Nursing note and vitals reviewed. Constitutional: She is oriented to person, place, and time. She appears well-developed and well-nourished. No distress.  HENT:  Head: Normocephalic and atraumatic.  Mouth/Throat: Oropharynx is clear and moist. No oropharyngeal exudate.  Eyes: Conjunctivae and EOM are normal. Pupils are equal, round, and reactive to light. No scleral icterus.  Neck: Normal range of motion.  Cardiovascular: Normal rate, regular rhythm and normal heart sounds.   Pulmonary/Chest:  Effort normal and breath sounds normal. No respiratory distress. She has no wheezes. She has no rales.   She exhibits tenderness (L anterior and posterior chest wall; diffuse). She exhibits no crepitus, no deformity, no swelling and no retraction.    No retractions or accessory muscle use. Chest expansion symmetric.  Abdominal: Soft. She exhibits no distension. There is no rebound and no guarding.  Musculoskeletal: Normal range  of motion.  Neurological: She is alert and oriented to person, place, and time.  Skin: Skin is warm and dry. No rash noted. She is not diaphoretic. No erythema. No pallor.  Psychiatric: She has a normal mood and affect. Her behavior is normal.    ED Course  Procedures (including critical care time) Labs Review Labs Reviewed  CBC - Abnormal; Notable for the following:    MCV 105.9 (*)    MCH 38.3 (*)    MCHC 36.2 (*)    All other components within normal limits  BASIC METABOLIC PANEL  I-STAT TROPOININ, ED   Imaging Review Dg Chest 2 View  10/31/2013   CLINICAL DATA Shortness of breath, chest pain, recent fall, left arm numbness, hypertension, former smoker, cardiomyopathy  EXAM CHEST  2 VIEW  COMPARISON 04/02/2007  FINDINGS Normal heart size, mediastinal contours, and pulmonary vascularity.  Lungs clear.  No pleural effusion or pneumothorax.  Prior cervical spine fusion.  No acute osseous findings.  IMPRESSION No acute abnormalities.  SIGNATURE  Electronically Signed   By: Ulyses Southward M.D.   On: 10/31/2013 18:58     EKG Interpretation None      MDM   Final diagnoses:  Chest wall muscle strain  Shoulder pain, left    Patient presents for left-sided chest pain and shoulder pain. Pain is worse with movement and palpation to the area. Patient is well and nontoxic appearing, hemodynamically stable, and afebrile. Physical exam significant for diffuse tenderness to the left anterior and posterior chest wall. Lungs clear to auscultation bilaterally. Patient  has symmetric chest expansion. No retractions or accessory muscle use noted. Patient denies history of ACS, DVT/PE, and coagulopathies; she does endorse a history of pneumothorax x2 in the right.  ACS work up today is negative. Doubt ACS in this patient given atypical nature of symptoms, reassuring work up and heart score of 3. No evidence of PTX on CXR. Doubt PE as patient without tachycardia, tachypnea, dyspnea, or hypoxia. Wells PE score 0. Suspect symptoms to be secondary to musculoskeletal pain him especially in light of symptom onset after rigorous yard work. Patient stable and appropriate for discharge with instruction to followup with her primary care provider. Will prescribe short course of Percocet for pain control as needed. Return precautions discussed and patient agreeable to plan with no unaddressed concerns.   Filed Vitals:   10/31/13 1800 10/31/13 1900 10/31/13 1940 10/31/13 1954  BP: 97/75 108/66 122/87   Pulse: 67 66 62   Temp:    97.9 F (36.6 C)  TempSrc:    Oral  Resp:      SpO2: 98% 96% 96%        Antony Madura, PA-C 11/04/13 2033

## 2013-11-01 NOTE — Progress Notes (Addendum)
  ED CM [facility] [phone] for clarification on frequency of medication. [facility] with c/o CP and Shoulder pain  oxyCODONE-acetaminophen (PERCOCET/ROXICET) 5-325 MG per tablet Take 1-2 tablets by mouth every 6 (six) hours as needed for moderate pain (Take only if needed. Verification provided.

## 2013-11-02 ENCOUNTER — Telehealth: Payer: Self-pay | Admitting: Neurology

## 2013-11-02 NOTE — Telephone Encounter (Signed)
[facility] called to ask for okay to fill Percocet #13 tabs prescribed by ED.  We prescribed #90 (30 day Rx) on 02/20.  The patient is supposed to be seen at a pain clinic, but as of last month she claims they have not been able to see her because they are waiting on records from another provider.  We have been filling her Rx until she is able to get in to see the pain doctor.  I tried to call both the home and cell number to inquire about pain clinic and to see if she has already taken all the meds we prescribed.  I got no answer on either line.  I will try again in a few minutes.

## 2013-11-02 NOTE — Telephone Encounter (Signed)
[facility] called and stated that the patient is there to pick up this prescription. [facility] and we would get back to her once we knew something. Please advise.

## 2013-11-02 NOTE — Telephone Encounter (Signed)
I called again. Spoke with patient.  She said the pain clinic is waiting on records from Dr [facility] office before they will see her.  States she is working with that office to get info, but having a difficult time.  Says she only has a few tabs (~ 3) of Percocet left from the Rx we wrote on 02/20.  States she fell on the ice a couple weeks ago and recently also pulled a muscle in her chest wall while working outside.  Because of this, she has taken more meds than prescribed.  I explained the importance of taking meds as prescribed and not altering dose unless advised by provider.  Patient verbalized understanding, but indicated she was in a great deal of pain, so much that she eventually went to ED.  She would like to get approval for #13 tabs to be dispensed since a Rx was given.  Told her I have to send message to [facility], but since she has not taken meds as prescribed, will likely not be able to get Rx.  She would like to know if something else, not as strong, can be called in if Percocet is not approved.   Dr Vickey Hugerohmeier is out of the office.  WID, Please advise.  Thank you.

## 2013-11-02 NOTE — Telephone Encounter (Signed)
[facility].  The Pt's husband is coming at when he gets off work at 4:00 to pick up the prescription. Please contact [facility] at [phone] to discuss. Thank you.

## 2013-11-02 NOTE — Telephone Encounter (Signed)
Hala calling to state that patient was in the emergency room and needs approval to dispense 13 Percocet since Dr. Vickey Hugerohmeier prescribed her 7290. Please call Hala back as soon as possible, she says it is okay to leave a voicemail.

## 2013-11-03 NOTE — Telephone Encounter (Signed)
I spoke with patient and advised she would not be able to get Rx refilled at this time.  She verbalized understanding.

## 2013-11-04 NOTE — ED Provider Notes (Signed)
Medical screening examination/treatment/procedure(s) were performed by non-physician practitioner and as supervising physician I was immediately available for consultation/collaboration.   EKG Interpretation   Date/Time:  Tuesday October 31 2013 13:41:08 EDT Ventricular Rate:  60 PR Interval:  116 QRS Duration: 82 QT Interval:  428 QTC Calculation: 428 R Axis:   75 Text Interpretation:  Normal sinus rhythm ST \\T \ T wave abnormality,  consider anterior ischemia Abnormal ECG ED PHYSICIAN INTERPRETATION  AVAILABLE IN [facility] Confirmed by TEST, Record ([id number]) on  11/02/2013 7:05:11 AM        Gertrude Tarbet B. Bernette MayersSheldon, MD 11/04/13 2227

## 2013-11-16 ENCOUNTER — Other Ambulatory Visit: Payer: Self-pay | Admitting: Neurology

## 2013-11-16 MED ORDER — OXYCODONE-ACETAMINOPHEN 5-325 MG PO TABS: 1.0000 | ORAL_TABLET | Freq: Three times a day (TID) | ORAL | Status: DC | PRN

## 2013-11-16 NOTE — Telephone Encounter (Signed)
Pt called and stated she needed a refill on oxyCODONE-acetaminophen (PERCOCET/ROXICET) 5-325 MG per tablet. Please call when the prescription is ready to be picked up.  Thank you

## 2013-11-16 NOTE — Telephone Encounter (Signed)
Called pt to inform her that her Rx was ready to be picked up at the front desk and if she has any other problems, questions or concerns to call the office. Pt verbalized understanding. °

## 2013-11-16 NOTE — Telephone Encounter (Signed)
Patient says the pain clinic is waiting for records from her there provider before they can see her.

## 2013-11-21 ENCOUNTER — Telehealth: Payer: Self-pay | Admitting: Cardiovascular Disease

## 2013-11-21 NOTE — Telephone Encounter (Signed)
New Message:  Per recall policy - pt has been notified 3 or more times by phone to sch her recall and a letter was sent. No PcP information on file and emergency contact is home #.. I have left multiple messages on cell and home phone. Pt has not called back to schedule... Final step is to see how the MD wishes to proceed in the patients care.

## 2013-11-23 ENCOUNTER — Encounter: Payer: BC Managed Care – PPO | Admitting: Neurology

## 2013-12-14 ENCOUNTER — Other Ambulatory Visit: Payer: Self-pay | Admitting: Neurology

## 2013-12-14 MED ORDER — OXYCODONE-ACETAMINOPHEN 5-325 MG PO TABS: 1.0000 | ORAL_TABLET | Freq: Three times a day (TID) | ORAL | Status: DC | PRN

## 2013-12-14 NOTE — Telephone Encounter (Signed)
Patient requesting refill of Percocet for breakthrough pain. Please call when ready to pick up.

## 2013-12-14 NOTE — Telephone Encounter (Signed)
Pt calling requesting medication refill. Please advise °

## 2013-12-14 NOTE — Telephone Encounter (Signed)
Request sent to provider for approval.  

## 2013-12-14 NOTE — Telephone Encounter (Signed)
Called pt and left message stating the her Rx was ready to be picked up at the front desk and if she has any other problems, questions or concerns to call the office.

## 2014-01-11 ENCOUNTER — Other Ambulatory Visit: Payer: Self-pay | Admitting: Neurology

## 2014-01-11 NOTE — Telephone Encounter (Signed)
Patient calling to request Percocet refill.

## 2014-01-11 NOTE — Telephone Encounter (Signed)
Request forwarded to provider for approval  

## 2014-01-12 ENCOUNTER — Telehealth: Payer: Self-pay | Admitting: Neurology

## 2014-01-12 MED ORDER — OXYCODONE-ACETAMINOPHEN 5-325 MG PO TABS: 1.0000 | ORAL_TABLET | Freq: Three times a day (TID) | ORAL | Status: DC | PRN

## 2014-01-12 NOTE — Telephone Encounter (Signed)
No she needs to pursue the pain clinic. There are several.   She always calls when she runs out with the same comment.

## 2014-01-12 NOTE — Telephone Encounter (Signed)
I called the patient back.  Relayed Dr Dohmeier's message.  She verbalized understanding.

## 2014-01-12 NOTE — Telephone Encounter (Signed)
Called pt to inform her per Dr. Vickey Huger that pt's Rx was ready to be picked up at the front desk and that this was the last Rx for pain and that it is enough to last for 10 days and to contact the pain clinic for further assistance. I advised the pt that if she has any other problems, questions or concerns to call the office. Pt verbalized understanding.

## 2014-01-12 NOTE — Telephone Encounter (Signed)
Patient would like written Rx for Percocet--please call patient when ready for pickup--thank you.

## 2014-01-12 NOTE — Telephone Encounter (Signed)
Patient called again to request refill on Percocet.  Request was denied yesterday.  Patient states she has not been able to get an appt at pain clinic yet.  Would you like to fill?

## 2014-01-25 ENCOUNTER — Other Ambulatory Visit: Payer: Self-pay | Admitting: Neurology

## 2014-02-07 ENCOUNTER — Telehealth: Payer: Self-pay | Admitting: Neurology

## 2014-02-07 NOTE — Telephone Encounter (Signed)
Left message for patient to pick up Rx for Valium, but doctor will not prescribe Percocet.  She is to go to pain clinic for that medicine.  Told to call with further questions.

## 2014-02-07 NOTE — Telephone Encounter (Signed)
Pt called states she needs Dr. Vickey Hugerohmeier or her nurse to return her call concerning her neuropathy has returned and she is having trouble getting out of bed. Pt states she also has ringing in her ears and it won't go away. Vision everything is blurry and migraine in the front/back of her head along with dizziness. This has been going on since she went off her medication diazepam (VALIUM) 5 MG tablet & oxyCODONE-acetaminophen (PERCOCET/ROXICET) 5-325 MG per tablet 2 weeks ago. Please call pt back concerning this matter. Thanks

## 2014-02-08 ENCOUNTER — Telehealth: Payer: Self-pay | Admitting: Neurology

## 2014-02-08 NOTE — Telephone Encounter (Signed)
Patient calling to follow up on status of script for Diazepam for her to pick up, states that she received voicemail from Lupita LeashDonna and is returning her call. Please call patient and advise.

## 2014-02-08 NOTE — Telephone Encounter (Signed)
Called pt to inform her that her Rx was ready to be picked up at the front desk and if she has any other problems, questions or concerns to call the office. Pt verbalized understanding. °

## 2014-02-13 ENCOUNTER — Telehealth: Payer: Self-pay | Admitting: Neurology

## 2014-02-13 NOTE — Telephone Encounter (Signed)
Bonnie with [facility] @ [phone], requesting a return call from Janne Napoleonana S.

## 2014-02-13 NOTE — Telephone Encounter (Signed)
Patient had an appointment with Dr. Murray HodgkinsBartko in October 2014 and no- showed.  [facility]             Spoke to [place] at [facility] and the patient had a referral to see Dr. Murray HodgkinsBartko in October 2014 and no showed.  They will try to schedule the referral again, if the patient's does not keep the appointment they will not be able to see her.

## 2014-02-14 NOTE — Telephone Encounter (Signed)
Multiple attempts have been made to contact patient. Will see if she calls back for follow u

## 2014-03-06 ENCOUNTER — Other Ambulatory Visit: Payer: Self-pay | Admitting: Neurology

## 2014-03-06 NOTE — Telephone Encounter (Signed)
Rx signed and faxed.

## 2014-03-06 NOTE — Telephone Encounter (Signed)
Patient has an appt scheduled at our office in Sept.

## 2014-03-29 NOTE — Telephone Encounter (Signed)
Noted  

## 2014-05-07 ENCOUNTER — Other Ambulatory Visit: Payer: Self-pay | Admitting: Cardiovascular Disease

## 2014-05-17 ENCOUNTER — Encounter (INDEPENDENT_AMBULATORY_CARE_PROVIDER_SITE_OTHER): Payer: Self-pay

## 2014-05-17 ENCOUNTER — Ambulatory Visit (INDEPENDENT_AMBULATORY_CARE_PROVIDER_SITE_OTHER): Payer: BC Managed Care – PPO | Admitting: Neurology

## 2014-05-17 ENCOUNTER — Encounter: Payer: Self-pay | Admitting: Neurology

## 2014-05-17 VITALS — BP 118/75 | HR 75 | Resp 17 | Ht 66.0 in | Wt 139.0 lb

## 2014-05-17 DIAGNOSIS — R208 Other disturbances of skin sensation: Secondary | ICD-10-CM

## 2014-05-17 DIAGNOSIS — F17200 Nicotine dependence, unspecified, uncomplicated: Secondary | ICD-10-CM

## 2014-05-17 DIAGNOSIS — M542 Cervicalgia: Secondary | ICD-10-CM

## 2014-05-17 DIAGNOSIS — IMO0001 Reserved for inherently not codable concepts without codable children: Secondary | ICD-10-CM

## 2014-05-17 DIAGNOSIS — F17209 Nicotine dependence, unspecified, with unspecified nicotine-induced disorders: Secondary | ICD-10-CM

## 2014-05-17 DIAGNOSIS — R209 Unspecified disturbances of skin sensation: Secondary | ICD-10-CM

## 2014-05-17 DIAGNOSIS — R292 Abnormal reflex: Secondary | ICD-10-CM

## 2014-05-17 MED ORDER — DIAZEPAM 5 MG PO TABS: 5.0000 mg | ORAL_TABLET | Freq: Three times a day (TID) | ORAL | Status: DC | PRN

## 2014-05-17 MED ORDER — CITALOPRAM HYDROBROMIDE 40 MG PO TABS: 40.0000 mg | ORAL_TABLET | Freq: Every day | ORAL | Status: DC

## 2014-05-17 NOTE — Patient Instructions (Signed)

## 2014-05-17 NOTE — Progress Notes (Signed)
[facility]  Provider:  Melvyn Novas, M D  Referring Provider: No ref. provider found Primary Care Physician:  Dr Elease Hashimoto, Dr Thyra Breed.    HPI:  [name] is a 53 y.o. female  Is seen here as a referral  from Dr. Vear Clock, pain management .  Associates about 6 years now she was diagnosed by Dr. Thyra Breed as possible reflex sympathetic dystrophy.  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  The neck surgery C3-4 was performed in 2007 by Dr. Gerlene Fee. There has always been a restricted range of motion after her surgery to the left arm .She had physical therapy and continued home exercises,noting some degree of improvement. In September 2013 she reported that she developed more and more cramping, she had also dropped trouble was cold and heat perception in the left hand and antebrachial. She has noticed some tremors, first onset in the left hand.  The patient now returns stating that she has had abnormal sensations to her right arm and more neck tenderness and spasms- almost a crunching  spasm paraspinally. The patient had one fall 3 years ago, non since. She remains in pain and sometimes anxious. She developed about 4 years ago an intravenous blood clot in the left arm.  She sleeps with a pillow on either side, not supine. Has been sleeping 5-6 hours nightly. Her husband noted ongoing kicking. Naps as needed in daytime.   The patient has received narcotic medication through this office and has signed a narcotic contract. I have made multiple attempts to refer her back to her pain management clinic.She has not been able to decrease her dose, but has not asked for early refills or increases , either. She is on only one pain medication and on Valium. She takes Percocet 3 times daily.    Interval history :  Had MRI brain and spine in 2014. Referred to pain management  - unsuccessfully. [name] is seen here today for yearly revisit. She has been doing well this is someone she was even able to tube down the [employer]. She has been seen by pain management- but the EMR at the office was down and she was not seen as a patient visit. Dr Gerlene Fee has seen the patients since and feels she has fibromyalgia. He was pointing out bone spurs on the cervical spine images, and how much the surgery had improved the DDD. She is still not seen again by Dr.Harkins or Dr Murray Hodgkins.    Review of Systems: Out of a complete 14 system review, the patient complains of only the following symptoms, and all other reviewed systems are negative.  She has been taking Valium - some relief of spasticity but has also treated her anxiety and nervousness. She drinks still alcohol, 2-3 glasses at dinner.  She is an active smoker.  She feels tired and in general pain.   Refill for 90 days- patient has been compliant , did not exceed pain medication prescribed, no early refills.      History   Social History  . Marital Status: Married    Spouse Name: Reuel Boom    Number of Children: 2  . Years of Education: 12   Occupational History  .      homemaker   Social History Main Topics  . Smoking status: Current Some Day Smoker    Types: E-cigarettes  .  Smokeless tobacco: Never Used     Comment: 1/2 pack daily,trying to quit  . Alcohol Use: Yes     Comment: consume 3 glasses of wine on weekends  . Drug Use: No  . Sexual Activity: Not on file   Other Topics Concern  . Not on file   Social History Narrative   Patient is married [name]) and lives at home with her husband.   Patient has two adult children.   Patient is currently not working.   Patient has a high school education.   Patient is right-handed.   Patient drinks 1-2 cups of coffee per day.    Family History  Problem Relation Age of Onset  . Coronary artery disease Father   . Parkinson's disease Mother   . Cancer  Sister     breast  . Diabetes Sister   . Diabetes Brother     Past Medical History  Diagnosis Date  . Chest pain     Normal heart catherization  . Diastolic dysfunction     with possible mild LVOT gradient  . Hypertension   . Back injury     Chronic  . Former cigarette smoker   . Deafness     Left ear  . Degenerative joint disease     with neck surgery  . Gestational diabetes   . Pneumothorax   . Pain     chronic   . Cardiomyopathy     hypertension controlled-helped with edema-Dr Nahser  . Headache(784.0)     migraines  . Fibromyalgia   . Leg cramps   . Respiratory disorder   . Depression   . Collapsed lung     Past Surgical History  Procedure Laterality Date  . Neck surgery    . Other surgical history      History of cervical and lumbar disk surgery  . Cardiac catheterization      Ejection Fraction 65-70%  . 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 Polypectomies      tubes  . Cervical fusion      C5-6    Current Outpatient Prescriptions  Medication Sig Dispense Refill  . acetaminophen (TYLENOL) 325 MG tablet Take 650 mg by mouth every 6 (six) hours as needed for mild pain, fever or headache.      . citalopram (CELEXA) 40 MG tablet Take 1 tablet (40 mg total) by mouth daily.  90 tablet  3  . diazepam (VALIUM) 5 MG tablet TAKE 1 TABLET BY MOUTH TWICE DAILY  60 tablet  2  . loperamide (IMODIUM A-D) 2 MG tablet Take 2 mg by mouth 4 (four) times daily as needed for diarrhea or loose stools.      . Menthol, Topical Analgesic, (BIOFREEZE EX) Apply 1 application topically 2 (two) times daily as needed (for pain).      . Menthol, Topical Analgesic, (ICY HOT EX) Apply 1 patch topically daily as needed.      . metoprolol tartrate (LOPRESSOR) 25 MG tablet TAKE 1 TABLET BY MOUTH TWICE DAILY  60 tablet  0   No current facility-administered medications for this visit.    Allergies as of 05/17/2014 - Review Complete 05/17/2014  Allergen  Reaction Noted  . Endocet [oxycodone-acetaminophen]  05/16/2013  . Neurontin [gabapentin]  05/16/2011    Vitals: BP 118/75  Pulse 75  Resp 17  Ht  (1.676 m)  Wt 139 lb (63.05 kg)  BMI  22.45 kg/m2 Last Weight:  Wt Readings from Last 1 Encounters:  05/17/14 139 lb (63.05 kg)   Last Height:   Ht Readings from Last 1 Encounters:  05/17/14  (1.676 m)    Physical exam:  General: The patient is awake, alert and appears  in acute distress. The patient is well groomed. Head: Normocephalic, atraumatic. Facial  Butterfly rash, erythema.  Neck is supple. Mallampati 3  neck circumference: 14.inches, no nasal airflow restriction, TMJ pain in the right.  Cardiovascular:  Regular rate and rhythm, without  murmurs or carotid bruit, and without distended neck veins. Respiratory: Lungs are clear to auscultation. Skin:  Without evidence of edema, or rash- she has petechiae over the left antebrachium ( the dog stepped on me )  Neurologic exam :The patient is awake and alert, oriented to place and time.  Memory subjective  described as intact. There is a normal attention span & concentration ability.  Speech is fluent without  dysarthria, dysphonia or aphasia. Mood and affect are appropriate.  Cranial nerves: Pupils are equal and briskly reactive to light. Funduscopic exam without  evidence of pallor or edema. Extraocular movements  in vertical and horizontal planes intact and without nystagmus. Visual fields by finger perimetry are intact. Hearing decreased to air and bone on the left ear. Facial sensation intact to fine touch.  Facial motor strength is symmetric and tongue and uvula move midline.  Motor exam: decreased grip strength, muscle mass appears decreased, Tremor in both hands, hyperreflexia.     Sensory:  Fine touch, pinprick and vibration were tested in all extremities. Proprioception is normal. Hyperesthesia to fine touch and tenderness to palpation.   Coordination: Rapid  alternating movements in the fingers/hands is tested and normal.  Finger-to-nose maneuver tested and normal with evidence of tremor in action .  Gait and station: Patient walks without assistive device. Strength within normal limits. Stance is stable and normal. Tandem gait is ataxic , unfragmented. Romberg testing is positive. Deep tendon reflexes: in the  upper and lower extremities are  brisk - hyperreflexic , symmetric .  Babinski maneuver response is down going.   Assessment:  After physical and neurologic examination, review of laboratory studies, imaging, neurophysiology testing and pre-existing records, assessment is that of a patient with a chronic Pain syndrome , taking narcotic pain medication. She experienced less and less relief from the medications, and progressively more tremor,  allodynia and hyperreflexia. She has spider navi and smokes.  The patient needs to stop smoking and drinking.   Plan: Treatment plan and additional workup : This patient cannot be followed in our neurology office. She has been referred to pain management.  Smoking cessation ordered. She can not afford to drink with these pain medications.  Pain management clinic needed.-referral to either Dr. Murray Hodgkins or Dr. At a pain clinic in [place]. Marland Kitchen  Psychiatry / psychology counseling suggested for living in pain- improve pain tolerance.    RV yearly, but not for pain management .

## 2014-06-14 ENCOUNTER — Other Ambulatory Visit: Payer: Self-pay | Admitting: Cardiovascular Disease

## 2014-06-15 NOTE — Telephone Encounter (Signed)
Can this be refilled, or ok to refuse? See phone note from 11/21/13, to date no appointment has been scheduled. Please advise. Thanks, MI

## 2014-06-18 NOTE — Telephone Encounter (Signed)
Patient must schedule a f/u appointment for refills

## 2014-06-22 ENCOUNTER — Other Ambulatory Visit: Payer: Self-pay | Admitting: *Deleted

## 2014-06-22 MED ORDER — METOPROLOL TARTRATE 25 MG PO TABS: ORAL_TABLET | ORAL | Status: DC

## 2014-07-24 ENCOUNTER — Telehealth: Payer: Self-pay | Admitting: Cardiovascular Disease

## 2014-07-24 ENCOUNTER — Ambulatory Visit: Payer: BC Managed Care – PPO | Admitting: Cardiovascular Disease

## 2014-07-24 MED ORDER — METOPROLOL TARTRATE 25 MG PO TABS: ORAL_TABLET | ORAL | Status: DC

## 2014-07-24 NOTE — Telephone Encounter (Signed)
Spoke with patient and advised her that I have sent additional Rx for Metoprolol so that she will have enough medication until her appointment with Dr. Elease HashimotoNahser in January.  Patient verbalized understanding and agreement and thanked me for the call.  I thanked patient for rescheduling appointment

## 2014-07-24 NOTE — Telephone Encounter (Signed)
New Msg  Patient calling, has the flu and had to reschedule today's appt. Patient is concerned that Dr. Elease HashimotoNahser will not refill bp meds because she states the pharmacy told her he wouldn't refill prescriptions without an office visit. Patient has about 1 week of bp meds left and appt was rescheduled for 08/27/2014. Please contact patient at home [phone] to discuss these concerns.

## 2014-08-27 ENCOUNTER — Encounter: Payer: Self-pay | Admitting: Cardiovascular Disease

## 2014-08-27 ENCOUNTER — Ambulatory Visit (INDEPENDENT_AMBULATORY_CARE_PROVIDER_SITE_OTHER): Payer: BC Managed Care – PPO | Admitting: Cardiovascular Disease

## 2014-08-27 DIAGNOSIS — I10 Essential (primary) hypertension: Secondary | ICD-10-CM | POA: Insufficient documentation

## 2014-08-27 MED ORDER — METOPROLOL TARTRATE 25 MG PO TABS: ORAL_TABLET | ORAL | Status: DC

## 2014-08-27 MED ORDER — POTASSIUM CHLORIDE CRYS ER 20 MEQ PO TBCR: 20.0000 meq | EXTENDED_RELEASE_TABLET | Freq: Every day | ORAL | Status: DC

## 2014-08-27 MED ORDER — HYDROCHLOROTHIAZIDE 25 MG PO TABS: 25.0000 mg | ORAL_TABLET | Freq: Every day | ORAL | Status: DC

## 2014-08-27 NOTE — Assessment & Plan Note (Signed)
[name] presents today with elevated BP. Will start HCTZ 25 a day, kdur 20 a day Will get BMP in 1 month and I will see her in 3 months for ov and BMP Ive asked her to record her BP regularly.

## 2014-08-27 NOTE — Patient Instructions (Signed)
Your physician has recommended you make the following change in your medication:  START HCTZ (Hydrochlorothiazide) 25 mg once daily START Kdur (potassium supplement) 20 meq once daily  Your physician recommends that you return for lab work in: 1 month - basic metabolic panel  Your physician recommends that you schedule a follow-up appointment in: 3 months with Dr. Elease Hashimoto Your physician recommends that you return for lab work in: at time of appointment with Dr. Elease Hashimoto (basic metabolic panel)

## 2014-08-27 NOTE — Progress Notes (Signed)
[name] Date of Birth  [birth year]       [facility] [address]  [address], [address] [place], [place]  [address]   [place], [place]  [address] [phone]     [phone]   Fax  [phone]    Fax [phone]  Problem List: 1. HTN 2. Palpitations 3. Neck surgery 4. Peripheral neuropathy 5. Chest pain 6. Dynamic LVOT obstruction  CONCLUSIONS: 1. Smooth and normal coronary arteries. 2. Hyperdynamic left ventricular systolic function.  She most likely     has a dynamic left ventricular outflow tract gradient.  She will     probably benefit from beta-blockers and calcium channel blockers.      History of Present Illness:  [name] is a 54 yo with hx of HTN and palpitations.  We had her on metoprolol.  She ran out of her metoprolol 6 months ago.  She has had some palpitations.   She stopped smoking but re-started again.    She is feeling well now.  Walking on occasion. She sees Yolonda Kida for her neuropathy.    She has occasional chest discomfort - last only a second or so.  Typically occurs after she twists her torso.    [name], [name] is seen after a 1 year absence Recently had bronchitis,  Still coughing. Still smoking a few cigarettes.  Trying to exercise regularly - typically swims regularly,  Limited by peripheral neuropathy.   Current Outpatient Prescriptions on File Prior to Visit  Medication Sig Dispense Refill  . acetaminophen (TYLENOL) 325 MG tablet Take 650 mg by mouth every 6 (six) hours as needed for mild pain, fever or headache.    . citalopram (CELEXA) 40 MG tablet Take 1 tablet (40 mg total) by mouth daily. 90 tablet 3  . diazepam (VALIUM) 5 MG tablet Take 1 tablet (5 mg total) by mouth every 8 (eight) hours as needed for anxiety. 90 tablet 3  . loperamide (IMODIUM A-D) 2 MG tablet Take 2 mg by mouth 4 (four) times daily as needed for diarrhea or loose stools.    . Menthol,  Topical Analgesic, (BIOFREEZE EX) Apply 1 application topically 2 (two) times daily as needed (for pain).    . Menthol, Topical Analgesic, (ICY HOT EX) Apply 1 patch topically daily as needed.    . metoprolol tartrate (LOPRESSOR) 25 MG tablet TAKE 1 TABLET BY MOUTH TWICE DAILY 31 tablet 0   No current facility-administered medications on file prior to visit.    Allergies  Allergen Reactions  . Endocet [Oxycodone-Acetaminophen]     Rash,did not help with pain  . Neurontin [Gabapentin]     Sleepy all the time    Past Medical History  Diagnosis Date  . Chest pain     Normal heart catherization  . Diastolic dysfunction     with possible mild LVOT gradient  . Hypertension   . Back injury     Chronic  . Former cigarette smoker   . Deafness     Left ear  . Degenerative joint disease     with neck surgery  . Gestational diabetes   . Pneumothorax   . Pain     chronic   . Cardiomyopathy     hypertension controlled-helped with edema-Dr Stepahnie Campo  . Headache(784.0)     migraines  . Fibromyalgia   . Leg cramps   . Respiratory disoBeverly Gustder   .  Depression   . Collapsed lung     Past Surgical History  Procedure Laterality Date  . Neck surgery    . Other surgical history      History of cervical and lumbar disk surgery  . Cardiac catheterization      Ejection Fraction 65-70%  . External ear surgery      tumor at age 77, 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  . Polypectomies      tubes  . Cervical fusion      C5-6    History  Smoking status  . Current Some Day Smoker  . Types: E-cigarettes  Smokeless tobacco  . Never Used    Comment: 1/2 pack daily,trying to quit    History  Alcohol Use  . Yes    Comment: consume 3 glasses of wine on weekends    Family History  Problem Relation Age of Onset  . Coronary artery disease Father   . Parkinson's disease Mother   . Cancer Sister     breast  . Diabetes Sister   . Diabetes Brother     Reviw of Systems:  Reviewed in the HPI.  All  other systems are negative.  Physical Exam: Blood pressure 170/114, pulse 60, height  (1.676 m), weight 144 lb 12.8 oz (65.681 kg). General: Well developed, well nourished, in no acute distress.  Head: Normocephalic, atraumatic, sclera non-icteric, mucus membranes are moist,   Neck: Supple. Carotids are 2 + without bruits. No JVD   Lungs: Clear   Heart: RR, soft systolic murmur  Abdomen: Soft, non-tender, non-distended with normal bowel sounds.  Msk:  Strength and tone are normal   Extremities: No clubbing or cyanosis. No edema.  Distal pedal pulses are 2+ and equal    Neuro: CN II - XII intact.  Alert and oriented X 3.   Psych:  Normal   ECG: Sept. 5, 2014:  NSR at 84.  ST / T wave abnormality in the inferior and lateral leads.  Assessment / Plan:

## 2014-08-27 NOTE — Assessment & Plan Note (Signed)
No symptoms 

## 2014-09-06 ENCOUNTER — Other Ambulatory Visit: Payer: Self-pay | Admitting: Neurology

## 2014-09-07 NOTE — Telephone Encounter (Signed)
Rx signed and faxed.

## 2014-09-25 ENCOUNTER — Telehealth: Payer: Self-pay | Admitting: Neurology

## 2014-09-25 ENCOUNTER — Telehealth: Payer: Self-pay | Admitting: Cardiovascular Disease

## 2014-09-25 NOTE — Telephone Encounter (Signed)
Left pt a message to call back. 

## 2014-09-25 NOTE — Telephone Encounter (Signed)
Phone call to patient to share with her that Dr. Elease HashimotoNahser is advising her to go to the Urgent Care on [address] so that she can be evaluated and establish herself with a PCP. Patient states that she did call her Neurologist. She is waiting for a call back.  Provided her education regarding seriousness of head injuries/ruling out concussion. Also advised her to let the Urgent Care know about her recent falls and her diarrhea/swollen glands.  Patient verbalized understanding and agreement that she would do so.

## 2014-09-25 NOTE — Telephone Encounter (Signed)
I called [name] and she stated that her last fall was 4 days ago in the kitchen and hit back of her head. When she hit her head she saw stars and has had dizziness.  No LOC.  She did not tlet herhusband know.  Has lump, bruise, c/o nausea, headache, neck hurting.  She is taking tylenol only and that is not cutting it.  She did not go to PM as per last note 04/2014.  She states that K and HCT causing her some problems (dirrhea) and has a call into Dr. Elease HashimotoNahser about that.  She see's him in 2 days for blood work.  I told her that I would forward message to Dr. Vickey Hugerohmeier.   If worsens, then needs to go to ED.  Likely needs head scan.  Asking for something for pain.  WIV?

## 2014-09-25 NOTE — Telephone Encounter (Signed)
Pt c/o medication issue:  1. Name of Medication: Potassium  2. How are you currently taking this medication (dosage and times per day)? 1x per day  3. Are you having a reaction (difficulty breathing--STAT)? No  4. What is your medication issue? Pt having a hard time swallowing potassium pill because it is so large, pt states she took it for 2 weeks and has now stopped and is back to drinking her V8 juice. Pt wants to make Dr. Elease HashimotoNahser aware of this as well as the fact that her legs are still hurting and glands still swollen, diarrhea, Pt has fallen 3 times in a matter of 3 weeks. Pt wants to know what she should do. Please call back and advise.

## 2014-09-25 NOTE — Telephone Encounter (Signed)
Patient states she does not have a PCP, only Dr. Elease HashimotoNahser and a neurologist. Is wanting advisement regarding the following matters and states that Dr. Elease HashimotoNahser told her to call him if she "had any problems with anything".  Problems are:  Fall Injuries -  1. Fall #1- Three weeks ago she was going down stairs and fell forward when tip of shoe caught and slipped on stairs. Right foot is bruised on top/side/ankle. Swollen but improving. Tender to touch.   2. Fall #2- One week ago she stumbled over kitchen floor (socks got caught on snag) with resultant dizziness, chronic headache and lump/"knot" above right ear/temporal area. States she should have called a doctor then but did not call anyone, as of yet. States she continues with the headache and dizziness.   Pain -  3. Bilateral leg pain generalized over entire leg with worsening pain to inner thighs with "lumps under the skin". (States "Dr. Elease HashimotoNahser told me to call him if this leg pain continued or got worse".)  Legs are cold but she attributes this to her neuropathy, although no appointment scheduled with neurologist for over 6 months. 4. Left sided "rib pain" that has been there for over three weeks (prior to falls).  Possible Infectious Process -  5. Diarrhea (watery, occasionally black loose stools, no specific foul odor, no bright bleeding noted).  6. Glands in neck feel swollen and can feel the "swelling when she swallows". States they look swollen in mirror too - bilaterally. Smokes occasionally only. No exudate or coughing. No fever. 7. Loss of appetite since starting medication. Although she did stop taking Potassium as the "pills were too big to take". Trying to eat potassium rich food and drink more V8 juice.   Questions -  8. Patient does not have PCP.  Wants appointment on Thursday when she comes in for labs. Is this possible?  Can she take anything else for pain for her legs and ribcage? She is only taking Tylenol.     Patient was advised  that Dr. Elease HashimotoNahser is not in clinic today and patient should contact her neurologist right away concerning the falls/head injury/headache and pain.  Advised that regarding the falls and head injury, she needs to be examined to rule out concussion. Patient verbalized understanding and stated she would call her neurologist at this time. Meanwhile, will route the message to Dr. Elease HashimotoNahser for his review upon his return to the clinic tomorrow.

## 2014-09-25 NOTE — Telephone Encounter (Signed)
She needs to be seen by PCP or urgent care, not ED.  Concussion/ contusion can cause the headaches,  but bony injuries need  to be ruled a out.  She is on chronic pain medicationand we do not prescribe any more, she needs to see pain management for that, would otherwise break her narcotic contract. CD

## 2014-09-25 NOTE — Telephone Encounter (Signed)
Patient is calling as she has had 3 falls in last 3 weeks. One incident she hit her head about 2 wks ago. She now has headaches, dizziness, pain in legs, and neck.  She [facility] Dr Nahser/[facility] [address] who put her her on potassium and Hydrochorzide. She is worried she has a concussion.  What does patient need to do for pain.  Please call.

## 2014-09-25 NOTE — Telephone Encounter (Signed)
She needs to go to Urgent Care on [address] and be evaluated. They also do primary care. I would not be able to address her injuries or why she is falling.

## 2014-09-26 NOTE — Telephone Encounter (Signed)
Patient returned call

## 2014-09-26 NOTE — Telephone Encounter (Signed)
I called and LMVM for pt to return call.   

## 2014-09-26 NOTE — Telephone Encounter (Signed)
I called pt and relayed Dr. Oliva Bustardohmeier's message.  Recommend evaluation by pcp or urgent care to rule concussion or any other issues relating to neck.  Pt verbalized understanding.   She does not have pcp at this time.  Needs evaluation re: these issues prior to see us.  Pain Management still recommended.  She had not seen.

## 2014-09-27 ENCOUNTER — Emergency Department (HOSPITAL_BASED_OUTPATIENT_CLINIC_OR_DEPARTMENT_OTHER): Payer: BLUE CROSS/BLUE SHIELD

## 2014-09-27 ENCOUNTER — Emergency Department (HOSPITAL_BASED_OUTPATIENT_CLINIC_OR_DEPARTMENT_OTHER)
Admission: EM | Admit: 2014-09-27 | Discharge: 2014-09-27 | Disposition: A | Discharge status: Home / Self Care | Payer: BLUE CROSS/BLUE SHIELD | Attending: Emergency Medicine | Admitting: Emergency Medicine

## 2014-09-27 ENCOUNTER — Encounter (HOSPITAL_BASED_OUTPATIENT_CLINIC_OR_DEPARTMENT_OTHER): Payer: Self-pay

## 2014-09-27 ENCOUNTER — Other Ambulatory Visit: Payer: BC Managed Care – PPO

## 2014-09-27 ENCOUNTER — Telehealth: Payer: Self-pay | Admitting: Cardiovascular Disease

## 2014-09-27 DIAGNOSIS — Y998 Other external cause status: Secondary | ICD-10-CM | POA: Diagnosis not present

## 2014-09-27 DIAGNOSIS — E871 Hypo-osmolality and hyponatremia: Secondary | ICD-10-CM | POA: Diagnosis not present

## 2014-09-27 DIAGNOSIS — Z79899 Other long term (current) drug therapy: Secondary | ICD-10-CM | POA: Insufficient documentation

## 2014-09-27 DIAGNOSIS — S060X0A Concussion without loss of consciousness, initial encounter: Secondary | ICD-10-CM | POA: Diagnosis not present

## 2014-09-27 DIAGNOSIS — Y9389 Activity, other specified: Secondary | ICD-10-CM | POA: Insufficient documentation

## 2014-09-27 DIAGNOSIS — H9190 Unspecified hearing loss, unspecified ear: Secondary | ICD-10-CM | POA: Diagnosis not present

## 2014-09-27 DIAGNOSIS — Z8669 Personal history of other diseases of the nervous system and sense organs: Secondary | ICD-10-CM | POA: Diagnosis not present

## 2014-09-27 DIAGNOSIS — W19XXXA Unspecified fall, initial encounter: Secondary | ICD-10-CM

## 2014-09-27 DIAGNOSIS — F329 Major depressive disorder, single episode, unspecified: Secondary | ICD-10-CM | POA: Diagnosis not present

## 2014-09-27 DIAGNOSIS — S93401A Sprain of unspecified ligament of right ankle, initial encounter: Secondary | ICD-10-CM | POA: Insufficient documentation

## 2014-09-27 DIAGNOSIS — I10 Essential (primary) hypertension: Secondary | ICD-10-CM | POA: Insufficient documentation

## 2014-09-27 DIAGNOSIS — Z720 Tobacco use: Secondary | ICD-10-CM | POA: Insufficient documentation

## 2014-09-27 DIAGNOSIS — W01198A Fall on same level from slipping, tripping and stumbling with subsequent striking against other object, initial encounter: Secondary | ICD-10-CM | POA: Insufficient documentation

## 2014-09-27 DIAGNOSIS — S0990XA Unspecified injury of head, initial encounter: Secondary | ICD-10-CM | POA: Diagnosis present

## 2014-09-27 DIAGNOSIS — Y92010 Kitchen of single-family (private) house as the place of occurrence of the external cause: Secondary | ICD-10-CM | POA: Insufficient documentation

## 2014-09-27 DIAGNOSIS — S134XXA Sprain of ligaments of cervical spine, initial encounter: Secondary | ICD-10-CM | POA: Insufficient documentation

## 2014-09-27 DIAGNOSIS — S139XXA Sprain of joints and ligaments of unspecified parts of neck, initial encounter: Secondary | ICD-10-CM

## 2014-09-27 HISTORY — DX: Polyneuropathy, unspecified: G62.9

## 2014-09-27 LAB — BASIC METABOLIC PANEL
Anion gap: 7 (ref 5–15)
BUN: 7 mg/dL (ref 6–23)
CO2: 26 mmol/L (ref 19–32)
Calcium: 8.3 mg/dL — ABNORMAL LOW (ref 8.4–10.5)
Chloride: 99 mmol/L (ref 96–112)
Creatinine, Ser: 0.54 mg/dL (ref 0.50–1.10)
GFR calc Af Amer: >90 mL/min (ref >90)
GFR calc non Af Amer: >90 mL/min (ref >90)
Glucose, Bld: 104 mg/dL — ABNORMAL HIGH (ref 70–99)
Potassium: 3.6 mmol/L (ref 3.5–5.1)
Sodium: 132 mmol/L — ABNORMAL LOW (ref 135–145)

## 2014-09-27 LAB — CBC WITH DIFFERENTIAL/PLATELET
Basophils Absolute: 0 10*3/uL (ref 0.0–0.1)
Basophils Relative: 1 % (ref 0–1)
Eosinophils Absolute: 0 10*3/uL (ref 0.0–0.7)
Eosinophils Relative: 1 % (ref 0–5)
HCT: 38.1 % (ref 36.0–46.0)
Hemoglobin: 13.6 g/dL (ref 12.0–15.0)
Lymphocytes Relative: 42 % (ref 12–46)
Lymphs Abs: 2.4 10*3/uL (ref 0.7–4.0)
MCH: 35.2 pg — ABNORMAL HIGH (ref 26.0–34.0)
MCHC: 35.7 g/dL (ref 30.0–36.0)
MCV: 98.7 fL (ref 78.0–100.0)
Monocytes Absolute: 0.4 10*3/uL (ref 0.1–1.0)
Monocytes Relative: 7 % (ref 3–12)
Neutro Abs: 2.8 10*3/uL (ref 1.7–7.7)
Neutrophils Relative %: 49 % (ref 43–77)
Platelets: 269 10*3/uL (ref 150–400)
RBC: 3.86 MIL/uL — ABNORMAL LOW (ref 3.87–5.11)
RDW: 12 % (ref 11.5–15.5)
WBC: 5.6 10*3/uL (ref 4.0–10.5)

## 2014-09-27 MED ORDER — POTASSIUM BICARBONATE 25 MEQ PO TBEF: 25.0000 meq | EFFERVESCENT_TABLET | Freq: Every day | ORAL | Status: DC

## 2014-09-27 MED ORDER — TRAMADOL HCL 50 MG PO TABS: 50.0000 mg | ORAL_TABLET | Freq: Four times a day (QID) | ORAL | Status: DC | PRN

## 2014-09-27 NOTE — ED Notes (Signed)
Patient transported to X-ray 

## 2014-09-27 NOTE — ED Notes (Signed)
Patient transported to CT 

## 2014-09-27 NOTE — Telephone Encounter (Signed)
New Message        Pt calling wanting to let Dr. Elease HashimotoNahser know that she is going to Urgent Care to be seen and also wants to know if she needs to try to continue taking her potassium pills. Please call back and advise.

## 2014-09-27 NOTE — Discharge Instructions (Signed)
Ankle Sprain °An ankle sprain is an injury to the strong, fibrous tissues (ligaments) that hold the bones of your ankle joint together.  °CAUSES °An ankle sprain is usually caused by a fall or by twisting your ankle. Ankle sprains most commonly occur when you step on the outer edge of your foot, and your ankle turns inward. People who participate in sports are more prone to these types of injuries.  °SYMPTOMS  °· Pain in your ankle. The pain may be present at rest or only when you are trying to stand or walk. °· Swelling. °· Bruising. Bruising may develop immediately or within 1 to 2 days after your injury. °· Difficulty standing or walking, particularly when turning corners or changing directions. °DIAGNOSIS  °Your caregiver will ask you details about your injury and perform a physical exam of your ankle to determine if you have an ankle sprain. During the physical exam, your caregiver will press on and apply pressure to specific areas of your foot and ankle. Your caregiver will try to move your ankle in certain ways. An X-ray exam may be done to be sure a bone was not broken or a ligament did not separate from one of the bones in your ankle (avulsion fracture).  °TREATMENT  °Certain types of braces can help stabilize your ankle. Your caregiver can make a recommendation for this. Your caregiver may recommend the use of medicine for pain. If your sprain is severe, your caregiver may refer you to a surgeon who helps to restore function to parts of your skeletal system (orthopedist) or a physical therapist. °HOME CARE INSTRUCTIONS  °· Apply ice to your injury for 1-2 days or as directed by your caregiver. Applying ice helps to reduce inflammation and pain. °· Put ice in a plastic bag. °· Place a towel between your skin and the bag. °· Leave the ice on for 15-20 minutes at a time, every 2 hours while you are awake. °· Only take over-the-counter or prescription medicines for pain, discomfort, or fever as directed by  your caregiver. °· Elevate your injured ankle above the level of your heart as much as possible for 2-3 days. °· If your caregiver recommends crutches, use them as instructed. Gradually put weight on the affected ankle. Continue to use crutches or a cane until you can walk without feeling pain in your ankle. °· If you have a plaster splint, wear the splint as directed by your caregiver. Do not rest it on anything harder than a pillow for the first 24 hours. Do not put weight on it. Do not get it wet. You may take it off to take a shower or bath. °· You may have been given an elastic bandage to wear around your ankle to provide support. If the elastic bandage is too tight (you have numbness or tingling in your foot or your foot becomes cold and blue), adjust the bandage to make it comfortable. °· If you have an air splint, you may blow more air into it or let air out to make it more comfortable. You may take your splint off at night and before taking a shower or bath. Wiggle your toes in the splint several times per day to decrease swelling. °SEEK MEDICAL CARE IF:  °· You have rapidly increasing bruising or swelling. °· Your toes feel extremely cold or you lose feeling in your foot. °· Your pain is not relieved with medicine. °SEEK IMMEDIATE MEDICAL CARE IF: °· Your toes are numb or blue. °·   You have severe pain that is increasing. MAKE SURE YOU:   Understand these instructions.  Will watch your condition.  Will get help right away if you are not doing well or get worse. Document Released: 08/10/2005 Document Revised: 05/04/2012 Document Reviewed: 08/22/2011 [facility] Patient Information 2015 [place], [place]. This information is not intended to replace advice given to you by your health care provider. Make sure you discuss any questions you have with your health care provider.  Concussion A concussion, or closed-head injury, is a brain injury caused by a direct blow to the head or by a quick and sudden  movement (jolt) of the head or neck. Concussions are usually not life-threatening. Even so, the effects of a concussion can be serious. If you have had a concussion before, you are more likely to experience concussion-like symptoms after a direct blow to the head.  CAUSES  Direct blow to the head, such as from running into another player during a soccer game, being hit in a fight, or hitting your head on a hard surface.  A jolt of the head or neck that causes the brain to move back and forth inside the skull, such as in a car crash. SIGNS AND SYMPTOMS The signs of a concussion can be hard to notice. Early on, they may be missed by you, family members, and health care providers. You may look fine but act or feel differently. Symptoms are usually temporary, but they may last for days, weeks, or even longer. Some symptoms may appear right away while others may not show up for hours or days. Every head injury is different. Symptoms include:  Mild to moderate headaches that will not go away.  A feeling of pressure inside your head.  Having more trouble than usual:  Learning or remembering things you have heard.  Answering questions.  Paying attention or concentrating.  Organizing daily tasks.  Making decisions and solving problems.  Slowness in thinking, acting or reacting, speaking, or reading.  Getting lost or being easily confused.  Feeling tired all the time or lacking energy (fatigued).  Feeling drowsy.  Sleep disturbances.  Sleeping more than usual.  Sleeping less than usual.  Trouble falling asleep.  Trouble sleeping (insomnia).  Loss of balance or feeling lightheaded or dizzy.  Nausea or vomiting.  Numbness or tingling.  Increased sensitivity to:  Sounds.  Lights.  Distractions.  Vision problems or eyes that tire easily.  Diminished sense of taste or smell.  Ringing in the ears.  Mood changes such as feeling sad or anxious.  Becoming easily  irritated or angry for little or no reason.  Lack of motivation.  Seeing or hearing things other people do not see or hear (hallucinations). DIAGNOSIS Your health care provider can usually diagnose a concussion based on a description of your injury and symptoms. He or she will ask whether you passed out (lost consciousness) and whether you are having trouble remembering events that happened right before and during your injury. Your evaluation might include:  A brain scan to look for signs of injury to the brain. Even if the test shows no injury, you may still have a concussion.  Blood tests to be sure other problems are not present. TREATMENT  Concussions are usually treated in an emergency department, in urgent care, or at a clinic. You may need to stay in the hospital overnight for further treatment.  Tell your health care provider if you are taking any medicines, including prescription medicines, over-the-counter medicines, and  natural remedies. Some medicines, such as blood thinners (anticoagulants) and aspirin, may increase the chance of complications. Also tell your health care provider whether you have had alcohol or are taking illegal drugs. This information may affect treatment.  Your health care provider will send you home with important instructions to follow.  How fast you will recover from a concussion depends on many factors. These factors include how severe your concussion is, what part of your brain was injured, your age, and how healthy you were before the concussion.  Most people with mild injuries recover fully. Recovery can take time. In general, recovery is slower in older persons. Also, persons who have had a concussion in the past or have other medical problems may find that it takes longer to recover from their current injury. HOME CARE INSTRUCTIONS General Instructions  Carefully follow the directions your health care provider gave you.  Only take over-the-counter  or prescription medicines for pain, discomfort, or fever as directed by your health care provider.  Take only those medicines that your health care provider has approved.  Do not drink alcohol until your health care provider says you are well enough to do so. Alcohol and certain other drugs may slow your recovery and can put you at risk of further injury.  If it is harder than usual to remember things, write them down.  If you are easily distracted, try to do one thing at a time. For example, do not try to watch TV while fixing dinner.  Talk with family members or close friends when making important decisions.  Keep all follow-up appointments. Repeated evaluation of your symptoms is recommended for your recovery.  Watch your symptoms and tell others to do the same. Complications sometimes occur after a concussion. Older adults with a brain injury may have a higher risk of serious complications, such as a blood clot on the brain.  Tell your teachers, school nurse, school counselor, coach, athletic trainer, or work Production designer, theatre/television/film about your injury, symptoms, and restrictions. Tell them about what you can or cannot do. They should watch for:  Increased problems with attention or concentration.  Increased difficulty remembering or learning new information.  Increased time needed to complete tasks or assignments.  Increased irritability or decreased ability to cope with stress.  Increased symptoms.  Rest. Rest helps the brain to heal. Make sure you:  Get plenty of sleep at night. Avoid staying up late at night.  Keep the same bedtime hours on weekends and weekdays.  Rest during the day. Take daytime naps or rest breaks when you feel tired.  Limit activities that require a lot of thought or concentration. These include:  Doing homework or job-related work.  Watching TV.  Working on the computer.  Avoid any situation where there is potential for another head injury (football, hockey,  soccer, basketball, martial arts, downhill snow sports and horseback riding). Your condition will get worse every time you experience a concussion. You should avoid these activities until you are evaluated by the appropriate follow-up health care providers. Returning To Your Regular Activities You will need to return to your normal activities slowly, not all at once. You must give your body and brain enough time for recovery.  Do not return to sports or other athletic activities until your health care provider tells you it is safe to do so.  Ask your health care provider when you can drive, ride a bicycle, or operate heavy machinery. Your ability to react may be slower  after a brain injury. Never do these activities if you are dizzy.  Ask your health care provider about when you can return to work or school. Preventing Another Concussion It is very important to avoid another brain injury, especially before you have recovered. In rare cases, another injury can lead to permanent brain damage, brain swelling, or death. The risk of this is greatest during the first 7-10 days after a head injury. Avoid injuries by:  Wearing a seat belt when riding in a car.  Drinking alcohol only in moderation.  Wearing a helmet when biking, skiing, skateboarding, skating, or doing similar activities.  Avoiding activities that could lead to a second concussion, such as contact or recreational sports, until your health care provider says it is okay.  Taking safety measures in your home.  Remove clutter and tripping hazards from floors and stairways.  Use grab bars in bathrooms and handrails by stairs.  Place non-slip mats on floors and in bathtubs.  Improve lighting in dim areas. SEEK MEDICAL CARE IF:  You have increased problems paying attention or concentrating.  You have increased difficulty remembering or learning new information.  You need more time to complete tasks or assignments than  before.  You have increased irritability or decreased ability to cope with stress.  You have more symptoms than before. Seek medical care if you have any of the following symptoms for more than 2 weeks after your injury:  Lasting (chronic) headaches.  Dizziness or balance problems.  Nausea.  Vision problems.  Increased sensitivity to noise or light.  Depression or mood swings.  Anxiety or irritability.  Memory problems.  Difficulty concentrating or paying attention.  Sleep problems.  Feeling tired all the time. SEEK IMMEDIATE MEDICAL CARE IF:  You have severe or worsening headaches. These may be a sign of a blood clot in the brain.  You have weakness (even if only in one hand, leg, or part of the face).  You have numbness.  You have decreased coordination.  You vomit repeatedly.  You have increased sleepiness.  One pupil is larger than the other.  You have convulsions.  You have slurred speech.  You have increased confusion. This may be a sign of a blood clot in the brain.  You have increased restlessness, agitation, or irritability.  You are unable to recognize people or places.  You have neck pain.  It is difficult to wake you up.  You have unusual behavior changes.  You lose consciousness. MAKE SURE YOU:  Understand these instructions.  Will watch your condition.  Will get help right away if you are not doing well or get worse. Document Released: 10/31/2003 Document Revised: 08/15/2013 Document Reviewed: 03/02/2013 [facility] Patient Information 2015 [place], [place]. This information is not intended to replace advice given to you by your health care provider. Make sure you discuss any questions you have with your health care provider.  Cervical Sprain A cervical sprain is an injury in the neck in which the strong, fibrous tissues (ligaments) that connect your neck bones stretch or tear. Cervical sprains can range from mild to severe. Severe  cervical sprains can cause the neck vertebrae to be unstable. This can lead to damage of the spinal cord and can result in serious nervous system problems. The amount of time it takes for a cervical sprain to get better depends on the cause and extent of the injury. Most cervical sprains heal in 1 to 3 weeks. CAUSES  Severe cervical sprains may be  caused by:   Contact sport injuries (such as from football, rugby, wrestling, hockey, auto racing, gymnastics, diving, martial arts, or boxing).   Motor vehicle collisions.   Whiplash injuries. This is an injury from a sudden forward and backward whipping movement of the head and neck.  Falls.  Mild cervical sprains may be caused by:   Being in an awkward position, such as while cradling a telephone between your ear and shoulder.   Sitting in a chair that does not offer proper support.   Working at a poorly Marketing executive station.   Looking up or down for long periods of time.  SYMPTOMS   Pain, soreness, stiffness, or a burning sensation in the front, back, or sides of the neck. This discomfort may develop immediately after the injury or slowly, 24 hours or more after the injury.   Pain or tenderness directly in the middle of the back of the neck.   Shoulder or upper back pain.   Limited ability to move the neck.   Headache.   Dizziness.   Weakness, numbness, or tingling in the hands or arms.   Muscle spasms.   Difficulty swallowing or chewing.   Tenderness and swelling of the neck.  DIAGNOSIS  Most of the time your health care provider can diagnose a cervical sprain by taking your history and doing a physical exam. Your health care provider will ask about previous neck injuries and any known neck problems, such as arthritis in the neck. X-rays may be taken to find out if there are any other problems, such as with the bones of the neck. Other tests, such as a CT scan or MRI, may also be needed.  TREATMENT   Treatment depends on the severity of the cervical sprain. Mild sprains can be treated with rest, keeping the neck in place (immobilization), and pain medicines. Severe cervical sprains are immediately immobilized. Further treatment is done to help with pain, muscle spasms, and other symptoms and may include:  Medicines, such as pain relievers, numbing medicines, or muscle relaxants.   Physical therapy. This may involve stretching exercises, strengthening exercises, and posture training. Exercises and improved posture can help stabilize the neck, strengthen muscles, and help stop symptoms from returning.  HOME CARE INSTRUCTIONS   Put ice on the injured area.   Put ice in a plastic bag.   Place a towel between your skin and the bag.   Leave the ice on for 15-20 minutes, 3-4 times a day.   If your injury was severe, you may have been given a cervical collar to wear. A cervical collar is a two-piece collar designed to keep your neck from moving while it heals.  Do not remove the collar unless instructed by your health care provider.  If you have long hair, keep it outside of the collar.  Ask your health care provider before making any adjustments to your collar. Minor adjustments may be required over time to improve comfort and reduce pressure on your chin or on the back of your head.  Ifyou are allowed to remove the collar for cleaning or bathing, follow your health care provider's instructions on how to do so safely.  Keep your collar clean by wiping it with mild soap and water and drying it completely. If the collar you have been given includes removable pads, remove them every 1-2 days and hand wash them with soap and water. Allow them to air dry. They should be completely dry before you wear them  in the collar.  If you are allowed to remove the collar for cleaning and bathing, wash and dry the skin of your neck. Check your skin for irritation or sores. If you see any, tell your  health care provider.  Do not drive while wearing the collar.   Only take over-the-counter or prescription medicines for pain, discomfort, or fever as directed by your health care provider.   Keep all follow-up appointments as directed by your health care provider.   Keep all physical therapy appointments as directed by your health care provider.   Make any needed adjustments to your workstation to promote good posture.   Avoid positions and activities that make your symptoms worse.   Warm up and stretch before being active to help prevent problems.  SEEK MEDICAL CARE IF:   Your pain is not controlled with medicine.   You are unable to decrease your pain medicine over time as planned.   Your activity level is not improving as expected.  SEEK IMMEDIATE MEDICAL CARE IF:   You develop any bleeding.  You develop stomach upset.  You have signs of an allergic reaction to your medicine.   Your symptoms get worse.   You develop new, unexplained symptoms.   You have numbness, tingling, weakness, or paralysis in any part of your body.  MAKE SURE YOU:   Understand these instructions.  Will watch your condition.  Will get help right away if you are not doing well or get worse. Document Released: 06/07/2007 Document Revised: 08/15/2013 Document Reviewed: 02/15/2013 [facility] Patient Information 2015 [place], [place]. This information is not intended to replace advice given to you by your health care provider. Make sure you discuss any questions you have with your health care provider.

## 2014-09-27 NOTE — Telephone Encounter (Signed)
Left message for patient to call the office

## 2014-09-27 NOTE — ED Notes (Signed)
Fell 4 weeks ago striking head on kitchen floor and has a headache.  Denies LOC.  Reports frequent falls.

## 2014-09-27 NOTE — ED Provider Notes (Signed)
CSN: [id number]     Arrival date & time 09/27/14  1203 History   First MD Initiated Contact with Patient 09/27/14 1240     Chief Complaint  Patient presents with  . Head Injury    HPI Pt initially fell more than 4 weeks ago injuring her right ankle.  She iced it and rested it but the swelling has persisted.  About 4 weeks ago she was in the kitchen and her sock got caught on the corner of tile.    She fall back and struck her head.  She did not lose consciousness but since then she has had neck pain and headache.  She also has pain in her right ankle and has noticed some swelling.    No numbness or weakness or vomiting.  She called her doctor and was told to get checked out. Past Medical History  Diagnosis Date  . Chest pain     Normal heart catherization  . Diastolic dysfunction     with possible mild LVOT gradient  . Hypertension   . Back injury     Chronic  . Former cigarette smoker   . Deafness     Left ear  . Degenerative joint disease     with neck surgery  . Gestational diabetes   . Pneumothorax   . Pain     chronic   . Cardiomyopathy     hypertension controlled-helped with edema-Dr Nahser  . Headache(784.0)     migraines  . Fibromyalgia   . Leg cramps   . Respiratory disorder   . Depression   . Collapsed lung   . Neuropathy    Past Surgical History  Procedure Laterality Date  . Neck surgery    . Other surgical history      History of cervical and lumbar disk surgery  . Cardiac catheterization      Ejection Fraction 65-70%  . 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 Polypectomies      tubes  . Cervical fusion      C5-6   Family History  Problem Relation Age of Onset  . Coronary artery disease Father   . Parkinson's disease Mother   . Cancer Sister     breast  . Diabetes Sister   . Diabetes Brother    History  Substance Use Topics  . Smoking status: Current Some Day Smoker    Types: E-cigarettes  . Smokeless tobacco:  Never Used     Comment: 1/2 pack daily,trying to quit  . Alcohol Use: Yes     Comment: consume 3 glasses of wine on weekends   OB History    No data available     Review of Systems  Constitutional: Negative for fever.  All other systems reviewed and are negative.     Allergies  Endocet and Neurontin  Home Medications   Prior to Admission medications   Medication Sig Start Date End Date Taking? Authorizing Provider  acetaminophen (TYLENOL) 325 MG tablet Take 650 mg by mouth every 6 (six) hours as needed for mild pain, fever or headache.    Historical Provider, MD  citalopram (CELEXA) 40 MG tablet Take 1 tablet (40 mg total) by mouth daily. 05/17/14   Porfirio Mylar Dohmeier, MD  diazepam (VALIUM) 5 MG tablet TAKE 1 TABLET BY MOUTH EVERY 8 HOURS AS NEEDED FOR ANXIETY 09/06/14   Porfirio Mylar Dohmeier, MD  hydrochlorothiazide (HYDRODIURIL) 25 MG tablet Take 1  tablet (25 mg total) by mouth daily. 08/27/14   Vesta MixerPhilip J Nahser, MD  loperamide (IMODIUM A-D) 2 MG tablet Take 2 mg by mouth 4 (four) times daily as needed for diarrhea or loose stools.    Historical Provider, MD  loratadine (SM LORATADINE) 5 MG/5ML syrup Take by mouth as needed.    Historical Provider, MD  Menthol, Topical Analgesic, (BIOFREEZE EX) Apply 1 application topically 2 (two) times daily as needed (for pain).    Historical Provider, MD  Menthol, Topical Analgesic, (ICY HOT EX) Apply 1 patch topically daily as needed.    Historical Provider, MD  metoprolol tartrate (LOPRESSOR) 25 MG tablet TAKE 1 TABLET BY MOUTH TWICE DAILY 08/27/14   Vesta MixerPhilip J Nahser, MD  potassium bicarbonate (K-LYTE) 25 MEQ disintegrating tablet Take 1 tablet (25 mEq total) by mouth daily. 09/27/14   Vesta MixerPhilip J Nahser, MD  traMADol (ULTRAM) 50 MG tablet Take 1 tablet (50 mg total) by mouth every 6 (six) hours as needed. 09/27/14   Linwood DibblesJon Colton Engdahl, MD   BP 122/65 mmHg  Pulse 68  Temp(Src) 98.6 F (37 C) (Oral)  Resp 16  SpO2 98% Physical Exam  Constitutional: She appears  well-developed and well-nourished. No distress.  HENT:  Head: Normocephalic and atraumatic.  Right Ear: External ear normal.  Left Ear: External ear normal.  Eyes: Conjunctivae are normal. Right eye exhibits no discharge. Left eye exhibits no discharge. No scleral icterus.  Neck: Neck supple. No tracheal deviation present.  Cardiovascular: Normal rate, regular rhythm and intact distal pulses.   Pulmonary/Chest: Effort normal and breath sounds normal. No stridor. No respiratory distress. She has no wheezes. She has no rales.  Abdominal: Soft. Bowel sounds are normal. She exhibits no distension. There is no tenderness. There is no rebound and no guarding.  Musculoskeletal: She exhibits no edema.       Right ankle: She exhibits swelling and ecchymosis. She exhibits no deformity and no laceration. Tenderness.       Cervical back: She exhibits tenderness and bony tenderness.       Thoracic back: Normal.       Lumbar back: Normal.  Neurological: She is alert. She has normal strength. No cranial nerve deficit (no facial droop, extraocular movements intact, no slurred speech) or sensory deficit. She exhibits normal muscle tone. She displays no seizure activity. Coordination normal.  Skin: Skin is warm and dry. No rash noted.  Psychiatric: She has a normal mood and affect.  Nursing note and vitals reviewed.   ED Course  Procedures (including critical care time) Labs Review Labs Reviewed  CBC WITH DIFFERENTIAL/PLATELET - Abnormal; Notable for the following:    RBC 3.86 (*)    MCH 35.2 (*)    All other components within normal limits  BASIC METABOLIC PANEL - Abnormal; Notable for the following:    Sodium 132 (*)    Glucose, Bld 104 (*)    Calcium 8.3 (*)    All other components within normal limits    Imaging Review Dg Tibia/fibula Right  09/27/2014   CLINICAL DATA:  Fall, 4 days ago, right ankle pain and bruising  EXAM: RIGHT TIBIA AND FIBULA - 2 VIEW  COMPARISON:  None.  FINDINGS: Two  views of the right tibia-fibula submitted. No acute fracture or subluxation. No radiopaque foreign body.  IMPRESSION: Negative.   Electronically Signed   By: Natasha MeadLiviu  Pop M.D.   On: 09/27/2014 13:56   Dg Ankle Complete Right  09/27/2014   CLINICAL DATA:  Fall, right ankle pain and bruising  EXAM: RIGHT ANKLE - COMPLETE 3+ VIEW  COMPARISON:  None.  FINDINGS: Three views of the right ankle submitted. No acute fracture or subluxation. Ankle mortise is preserved. No radiopaque foreign body.  IMPRESSION: Negative.   Electronically Signed   By: Natasha Mead M.D.   On: 09/27/2014 13:57   Ct Head Wo Contrast  09/27/2014   CLINICAL DATA:  Head injury post repeat falls last 3 weeks  EXAM: CT HEAD WITHOUT CONTRAST  CT CERVICAL SPINE WITHOUT CONTRAST  TECHNIQUE: Multidetector CT imaging of the head and cervical spine was performed following the standard protocol without intravenous contrast. Multiplanar CT image reconstructions of the cervical spine were also generated.  COMPARISON:  04/02/2007  FINDINGS: CT HEAD FINDINGS  . No skull fracture is noted. Paranasal sinuses shows mild mucosal thickening with partial opacification right posterior ethmoid air cells. The mastoid air cells are unremarkable. No intracranial hemorrhage, mass effect or midline shift.  No acute cortical infarction.  Ventricular size is stable from prior exam. No intra or extra-axial fluid collection. No mass lesion is noted on this unenhanced scan.  CT CERVICAL SPINE FINDINGS  Axial images of the cervical spine shows no acute fracture or subluxation. Computer processed images shows no acute fracture or subluxation. Mild degenerative changes C1-C2 articulation. Again noted anterior metallic fusion at C4-C5 level. Stable bony fusion of C5-C6 vertebral body. There is disc space flattening with mild anterior and mild posterior spurring at C6-C7 level. Mild disc space flattening with anterior spurring at C7-T1 level. No prevertebral soft tissue swelling.  Cervical airway is patent.  IMPRESSION: 1. No acute intracranial abnormality. Mild mucosal thickening with partial opacification right ethmoid air cells. 2. No cervical spine acute fracture or subluxation. Stable postsurgical changes at C4-C5 and C6 level. Mild degenerative changes as described above.   Electronically Signed   By: Natasha Mead M.D.   On: 09/27/2014 13:52   Ct Cervical Spine Wo Contrast  09/27/2014   CLINICAL DATA:  Head injury post repeat falls last 3 weeks  EXAM: CT HEAD WITHOUT CONTRAST  CT CERVICAL SPINE WITHOUT CONTRAST  TECHNIQUE: Multidetector CT imaging of the head and cervical spine was performed following the standard protocol without intravenous contrast. Multiplanar CT image reconstructions of the cervical spine were also generated.  COMPARISON:  04/02/2007  FINDINGS: CT HEAD FINDINGS  . No skull fracture is noted. Paranasal sinuses shows mild mucosal thickening with partial opacification right posterior ethmoid air cells. The mastoid air cells are unremarkable. No intracranial hemorrhage, mass effect or midline shift.  No acute cortical infarction.  Ventricular size is stable from prior exam. No intra or extra-axial fluid collection. No mass lesion is noted on this unenhanced scan.  CT CERVICAL SPINE FINDINGS  Axial images of the cervical spine shows no acute fracture or subluxation. Computer processed images shows no acute fracture or subluxation. Mild degenerative changes C1-C2 articulation. Again noted anterior metallic fusion at C4-C5 level. Stable bony fusion of C5-C6 vertebral body. There is disc space flattening with mild anterior and mild posterior spurring at C6-C7 level. Mild disc space flattening with anterior spurring at C7-T1 level. No prevertebral soft tissue swelling. Cervical airway is patent.  IMPRESSION: 1. No acute intracranial abnormality. Mild mucosal thickening with partial opacification right ethmoid air cells. 2. No cervical spine acute fracture or subluxation.  Stable postsurgical changes at C4-C5 and C6 level. Mild degenerative changes as described above.   Electronically Signed   By: Natasha Mead  M.D.   On: 09/27/2014 13:52     MDM   Final diagnoses:  Fall  Concussion, without loss of consciousness, initial encounter  Cervical sprain, initial encounter  Ankle sprain, right, initial encounter  Hyponatremia    Patient's x-rays do not show any significant injuries. They're consistent with sprain and strain.  Her falls were mechanical and not related to weakness or dizziness.  The patient did start taking hydrochlorothiazide last month. This most likely is the cause of her hyponatremia compared to her prior labs. I suggested she contact her cardiologist to see whether or not she should continue that medication or have her sodium level rechecked.    Linwood Dibbles, MD 09/27/14 [phone]

## 2014-09-27 NOTE — Telephone Encounter (Signed)
Spoke with patient who states she has stopped taking potassium supplement due to unable to swallow.  Patient states she has increased her intake of potassium-rich foods, including V8 juice and bananas.  I advised that I can send Rx for dissolvable potassium tablets that she can dissolve in water and drink.  Patient verbalized understanding and agreement and states she is going to Urgent Care today as advised by Dr. Elease HashimotoNahser and Dr. Vickey Hugerohmeier.

## 2014-10-02 ENCOUNTER — Other Ambulatory Visit: Payer: BC Managed Care – PPO

## 2015-02-26 ENCOUNTER — Other Ambulatory Visit: Payer: Self-pay | Admitting: Neurology

## 2015-02-26 NOTE — Telephone Encounter (Signed)
Dr Dohmeier is out of the office.  Request forwarded to [facility] for approval.   

## 2015-02-26 NOTE — Telephone Encounter (Signed)
Rx signed and faxed.

## 2015-03-27 ENCOUNTER — Other Ambulatory Visit: Payer: Self-pay | Admitting: Neurology

## 2015-03-27 NOTE — Telephone Encounter (Signed)
Patient has appt scheduled

## 2015-03-28 NOTE — Telephone Encounter (Signed)
Rx signed and faxed.

## 2015-04-08 DIAGNOSIS — F1020 Alcohol dependence, uncomplicated: Secondary | ICD-10-CM | POA: Insufficient documentation

## 2015-04-09 DIAGNOSIS — K2970 Gastritis, unspecified, without bleeding: Secondary | ICD-10-CM | POA: Insufficient documentation

## 2015-04-09 DIAGNOSIS — F32A Depression, unspecified: Secondary | ICD-10-CM | POA: Insufficient documentation

## 2015-04-09 DIAGNOSIS — F329 Major depressive disorder, single episode, unspecified: Secondary | ICD-10-CM | POA: Insufficient documentation

## 2015-05-20 ENCOUNTER — Ambulatory Visit (INDEPENDENT_AMBULATORY_CARE_PROVIDER_SITE_OTHER): Payer: BLUE CROSS/BLUE SHIELD | Admitting: Adult Health

## 2015-05-20 ENCOUNTER — Encounter: Payer: Self-pay | Admitting: Adult Health

## 2015-05-20 VITALS — BP 118/64 | HR 53 | Ht 66.0 in | Wt 142.0 lb

## 2015-05-20 DIAGNOSIS — G894 Chronic pain syndrome: Secondary | ICD-10-CM

## 2015-05-20 NOTE — Progress Notes (Signed)
I agree with the assessment and plan as directed by NP .The patient is known to me .   DOHMEIER,CARMEN, MD  

## 2015-05-20 NOTE — Patient Instructions (Signed)
Congratulations on 28 days sober!  Continue to work at this daily. If pain worsens we can consider another pain clinic  If your symptoms worsen or you develop new symptoms please let us know.

## 2015-05-20 NOTE — Progress Notes (Signed)
PATIENT: [name] DOB: 06-24-1961  REASON FOR VISIT: follow up-chronic pain HISTORY FROM: patient  HISTORY OF PRESENT ILLNESS: [name] is a 54 year old female with a history of chronic pain. She returns today for follow-up. In the past the patient has been on narcotics. However she is no longer on this medication. She has followed with Dr. Murray Hodgkins regarding her pain however she was not satisfied with the office visit therefore she has not followed back up with him. She states that in July she was drinking heavily as well as taking her Valium. She was hospitalized at [facility] for 3 days and then sent to an addiction center in [place]. She states that she has now been sober 28 days. She continues to go to daily [employer] She is living at home. She states that her family  is very supportive. She is currently using Klonopin and Zanaflex to help with her pain. She continues to have intermittent numbness that affects the left arm. She is considering trying acupuncture in the future for her pain. She returns today for an evaluation.  HISTORY: 05/17/14: [name] is a 54 y.o. female Is seen here as a referral from Dr. Vear Clock, pain management .  Associates about 6 years now she was diagnosed by Dr. Thyra Breed as possible reflex sympathetic dystrophy.  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  The neck surgery C3-4 was performed in 2007 by Dr. Gerlene Fee. There has always been a restricted range of motion after her surgery to the left arm .She had physical therapy and continued home exercises,noting some degree of improvement. In September 2013 she reported that she developed more and more cramping, she had also dropped trouble was cold and heat perception in the left hand and antebrachial. She has noticed some tremors, first onset in the left hand.  The patient now  returns stating that she has had abnormal sensations to her right arm and more neck tenderness and spasms- almost a crunching spasm paraspinally. The patient had one fall 3 years ago, non since. She remains in pain and sometimes anxious. She developed about 4 years ago an intravenous blood clot in the left arm.  She sleeps with a pillow on either side, not supine. Has been sleeping 5-6 hours nightly. Her husband noted ongoing kicking. Naps as needed in daytime.   The patient has received narcotic medication through this office and has signed a narcotic contract. I have made multiple attempts to refer her back to her pain management clinic.She has not been able to decrease her dose, but has not asked for early refills or increases , either. She is on only one pain medication and on Valium. She takes Percocet 3 times daily.    Interval history : Had MRI brain and spine in 2014. Referred to pain management - unsuccessfully. [name] is seen here today for yearly revisit. She has been doing well this is someone she was even able to tube down the [employer]. She has been seen by pain management- but the EMR at the office was down and she was not seen as a patient visit. Dr Gerlene Fee has seen the patients since and feels she has fibromyalgia. He was pointing out bone spurs on the cervical spine images, and how much the surgery had improved the DDD. She is still not seen again by Dr.Harkins or Dr  Bartko.     REVIEW OF SYSTEMS: Out of a complete 14 system review of symptoms, the patient complains only of the following symptoms, and all other reviewed systems are negative.  See history of present illness  ALLERGIES: Allergies  Allergen Reactions  . Endocet [Oxycodone-Acetaminophen]     Rash,did not help with pain Rash,did not help with pain  . Neurontin [Gabapentin]     Sleepy all the time    HOME MEDICATIONS: Outpatient Prescriptions Prior to Visit  Medication Sig Dispense Refill  .  acetaminophen (TYLENOL) 325 MG tablet Take 650 mg by mouth every 6 (six) hours as needed for mild pain, fever or headache.    . citalopram (CELEXA) 40 MG tablet Take 1 tablet (40 mg total) by mouth daily. 90 tablet 3  . hydrochlorothiazide (HYDRODIURIL) 25 MG tablet Take 1 tablet (25 mg total) by mouth daily. 90 tablet 3  . loperamide (IMODIUM A-D) 2 MG tablet Take 2 mg by mouth 4 (four) times daily as needed for diarrhea or loose stools.    Marland Kitchen loratadine (SM LORATADINE) 5 MG/5ML syrup Take by mouth as needed.    . Menthol, Topical Analgesic, (BIOFREEZE EX) Apply 1 application topically 2 (two) times daily as needed (for pain).    . Menthol, Topical Analgesic, (ICY HOT EX) Apply 1 patch topically daily as needed.    . metoprolol tartrate (LOPRESSOR) 25 MG tablet TAKE 1 TABLET BY MOUTH TWICE DAILY 181 tablet 3  . potassium bicarbonate (K-LYTE) 25 MEQ disintegrating tablet Take 1 tablet (25 mEq total) by mouth daily. 31 tablet 11  . diazepam (VALIUM) 5 MG tablet TAKE 1 TABLET BY MOUTH EVERY 8 HOURS AS NEEDED FOR ANXIETY (Patient not taking: Reported on 05/20/2015) 90 tablet 1  . traMADol (ULTRAM) 50 MG tablet Take 1 tablet (50 mg total) by mouth every 6 (six) hours as needed. (Patient not taking: Reported on 05/20/2015) 20 tablet 0   No facility-administered medications prior to visit.    PAST MEDICAL HISTORY: Past Medical History  Diagnosis Date  . Chest pain     Normal heart catherization  . Diastolic dysfunction     with possible mild LVOT gradient  . Hypertension   . Back injury     Chronic  . Former cigarette smoker   . Deafness     Left ear  . Degenerative joint disease     with neck surgery  . Gestational diabetes   . Pneumothorax   . Pain     chronic   . Cardiomyopathy     hypertension controlled-helped with edema-Dr Nahser  . Headache(784.0)     migraines  . Fibromyalgia   . Leg cramps   . Respiratory disorder   . Depression   . Collapsed lung   . Neuropathy     PAST  SURGICAL HISTORY: Past Surgical History  Procedure Laterality Date  . Neck surgery    . Other surgical history      History of cervical and lumbar disk surgery  . Cardiac catheterization      Ejection Fraction 65-70%  . External ear surgery      tumor at age 81, 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  . Polypectomies      tubes  . Cervical fusion      C5-6    FAMILY HISTORY: Family History  Problem Relation Age of Onset  . Coronary artery disease Father   . Parkinson's disease Mother   . Cancer Sister     breast  .  Diabetes Sister   . Diabetes Brother     SOCIAL HISTORY: Social History   Social History  . Marital Status: Married    Spouse Name: Reuel Boom  . Number of Children: 2  . Years of Education: 12   Occupational History  .      homemaker   Social History Main Topics  . Smoking status: Current Some Day Smoker    Types: E-cigarettes  . Smokeless tobacco: Never Used     Comment: 1/2 pack daily,trying to quit  . Alcohol Use: Yes     Comment: consume 3 glasses of wine on weekends  . Drug Use: No  . Sexual Activity: Not on file   Other Topics Concern  . Not on file   Social History Narrative   Patient is married [name]) and lives at home with her husband.   Patient has two adult children.   Patient is currently not working.   Patient has a high school education.   Patient is right-handed.   Patient drinks 1-2 cups of coffee per day.      PHYSICAL EXAM  Filed Vitals:   05/20/15 0933  BP: 118/64  Pulse: 53  Height:  (1.676 m)  Weight: 142 lb (64.411 kg)   Body mass index is 22.93 kg/(m^2).  Generalized: Well developed, in no acute distress   Neurological examination  Mentation: Alert oriented to time, place, history taking. Follows all commands speech and language fluent Cranial nerve II-XII:  Extraocular movements were full, visual field were full on confrontational test. Facial sensation and strength were normal. Uvula tongue midline. Head turning and  shoulder shrug  were normal and symmetric. Motor: The motor testing reveals 5 over 5 strength of all 4 extremities. Good symmetric motor tone is noted throughout.  Sensory: Sensory testing is intact to soft touch on all 4 extremities. No evidence of extinction is noted.  Coordination: Cerebellar testing reveals good finger-nose-finger and heel-to-shin bilaterally. Mild intention tremor noted in the right hand. Gait and station: Gait is normal. Tandem gait is slightly unsteady. Romberg is negative. No drift is seen.  Reflexes: Deep tendon reflexes are symmetric and normal bilaterally.   DIAGNOSTIC DATA (LABS, IMAGING, TESTING) - I reviewed patient records, labs, notes, testing and imaging myself where available.      ASSESSMENT AND PLAN 54 y.o. year old female  has a past medical history of Chest pain; Diastolic dysfunction; Hypertension; Back injury; Former cigarette smoker; Deafness; Degenerative joint disease; Gestational diabetes; Pneumothorax; Pain; Cardiomyopathy; Headache(784.0); Fibromyalgia; Leg cramps; Respiratory disorder; Depression; Collapsed lung; and Neuropathy. here with:  1. Chronic pain syndrome  Overall the patient has done well. I have congratulated the patient for remaining sober for 28 days. Encouraged her to continue with her AA meetings. Advised patient that if she would like to be referred to another pain clinic in the future we will be happy to do that. Narcotics should be avoided. We are not managing the patient's pain at this point. I have advised the patient that she can follow-up with our office on an as-needed basis. However the patient would like to follow-up yearly. Therefore the patient will follow-up in one year with Dr. Vergia Alcon, MSN, NP-C 05/20/2015, 9:41 AM [facility] [address], [address] [place], [place] [address] [phone]

## 2015-05-22 ENCOUNTER — Telehealth: Payer: Self-pay | Admitting: Neurology

## 2015-05-22 ENCOUNTER — Other Ambulatory Visit: Payer: Self-pay

## 2015-05-22 DIAGNOSIS — F17209 Nicotine dependence, unspecified, with unspecified nicotine-induced disorders: Secondary | ICD-10-CM

## 2015-05-22 DIAGNOSIS — M542 Cervicalgia: Secondary | ICD-10-CM

## 2015-05-22 DIAGNOSIS — G894 Chronic pain syndrome: Secondary | ICD-10-CM

## 2015-05-22 DIAGNOSIS — R208 Other disturbances of skin sensation: Secondary | ICD-10-CM

## 2015-05-22 DIAGNOSIS — IMO0001 Reserved for inherently not codable concepts without codable children: Secondary | ICD-10-CM

## 2015-05-22 DIAGNOSIS — R292 Abnormal reflex: Secondary | ICD-10-CM

## 2015-05-22 MED ORDER — CITALOPRAM HYDROBROMIDE 40 MG PO TABS: 40.0000 mg | ORAL_TABLET | Freq: Every day | ORAL | Status: DC

## 2015-05-22 NOTE — Telephone Encounter (Signed)
Spoke to pt. I advised her that again that our office cannot refill medications prescribed by other providers. She says she is going to run out of her Klonopin.I explained to her that since we were'nt the original prescribers, we cannot be sure of actual dose of klonopin.She says that [facility] was the one to prescribe it last. I advised her to call that office and explain the situation and maybe they would refill it. I also gave her the address and phone number of [facility]. Pt says she will call them and [facility].

## 2015-05-22 NOTE — Telephone Encounter (Signed)
Spoke to pt. Pt says she does not have a PCP to follow up and cannot withdraw from Klonopin. I advised her that Aundra Millet, NP said we could order a referral to internal medicine. Pt verbalized understanding. Pt is requesting a refill on her celexa, that was ordered by Dr. Vickey Huger. Refill given.

## 2015-05-22 NOTE — Telephone Encounter (Signed)
We (Dr. Vickey Huger nor I) have not prescribed these medications in the past. Patient has been instructed to follow through pain management and PCP.

## 2015-05-22 NOTE — Telephone Encounter (Signed)
Pt says ClonazePAM (KLONOPIN PO,  tiZANidine (ZANAFLEX) 2 MG tablet, buPROPion (WELLBUTRIN SR) 150 MG 12 hr tablet, hydrOXYzine (VISTARIL) 100 MG capsule were not sent to Pharmacy. She would like refills please. Pt also says that while at [facility] they gave her a rx for Methocarbamol.  1 tablet 3 x daily. She would like refill as well. Please call and advise [phone]

## 2015-05-22 NOTE — Telephone Encounter (Signed)
[name], pharmacist at [facility], that our office is only refilling the celexa and that will be faxed. The other medications we will not be refilling. [name] verbalized understanding.

## 2015-05-22 NOTE — Telephone Encounter (Signed)
Patient called stating that no one has called her back regarding referral to PCP. Per Baxter Hire, she spoke to patient earlier. Referral was just placed and may take a few days. Patient is upset because she is going to run out of her medications until a PCP can refill her meds since our office can't. I advised patient that she may want to go to a walk in clinic in the meantime. Patient said that's another $50 down the drain that I can't afford. I advised patient I would send message to nurse.

## 2015-05-22 NOTE — Telephone Encounter (Signed)
Phone Call from [employer] [phone]. Patient told pharmacy that our office faxed in prescriptions for Celexa, Klonopin, Zanaflex, Wellbutrin, Vistaril and Methocarbamol. [facility] has no record of this. Please call pharmacy to advise.

## 2015-06-05 ENCOUNTER — Telehealth: Payer: Self-pay | Admitting: Neurology

## 2015-06-05 ENCOUNTER — Other Ambulatory Visit: Payer: Self-pay | Admitting: Neurology

## 2015-06-05 DIAGNOSIS — F1920 Other psychoactive substance dependence, uncomplicated: Secondary | ICD-10-CM

## 2015-06-05 NOTE — Telephone Encounter (Signed)
No, it is not save to drink and use Valium, and she had not needed Valium for many month now. She needs to see Dr Murray HodgkinsBartko and be honest.

## 2015-06-05 NOTE — Telephone Encounter (Signed)
Dr. Vickey Hugerohmeier was prescribing diazepam for her in the past. "She has been taking Valium - some relief of spasticity but has also treated her anxiety and nervousness."  Pt reported on 05/20/2015 in her visit with Aundra MilletMegan, NP, that she was not taking the valium, and it was discontinued.  Aundra MilletMegan advised pt of this in her 05/20/15 visit - Advised patient that if she would like to be referred to another pain clinic in the future we will be happy to do that. Narcotics should be avoided. We are not managing the patient's pain at this point. I have advised the patient that she can follow-up with our office on an as-needed basis. However the patient would like to follow-up yearly. Therefore the patient will follow-up in one year with Dr. Vickey Hugerohmeier.  Also noted in Megan's note is this -She has followed with Dr. Murray HodgkinsBartko regarding her pain however she was not satisfied with the office visit therefore she has not followed back up with him. She states that in July she was drinking heavily as well as taking her Valium. She was hospitalized at [facility] for 3 days and then sent to an addiction center in [place].  I will ask Dr. Vickey Hugerohmeier if she is willing to refill the pt's valium again and call the pt back.

## 2015-06-05 NOTE — Telephone Encounter (Signed)
Pt called stating she went to [facility] to detox from alcohol on in 04/06/15 and discharged 05/07/15 sober. She did relapse for about 1-1/12 weeks but is currently sober. She states the [facility] discontinued the diazepam in 8/16. Pt is having shaking, loss of concentration ringing in ears, hard time functioning,she having panic attacks, vision is blurry, headaches and pain in neck. She was taking klonipin but that has been discontinued on 05/24/15. Last refill for valiumwas 05/09/15 with no plans of refilling medication. [facility] [place] advised her to ask Dr Dohmerier to prescibe diazempam again. She has been going to [employer] meetings.Please call and advise. Patient can be reached at [phone]. She is going to try to see counselor this morning if she doesn't pick up.

## 2015-06-05 NOTE — Telephone Encounter (Signed)
The patient is mistaken - We can not fill Valium as it uts her at higher risk of intoxication while drinking. She needs to direct her care to alcohol treatment center. Recommend [facility], [place].

## 2015-06-05 NOTE — Telephone Encounter (Signed)
Please see note in Epic.  Dr Vickey Hugerohmeier does not wish to fill this Rx.

## 2015-06-05 NOTE — Telephone Encounter (Signed)
Spoke to pt and advised her that Dr. Vickey Hugerohmeier does not wish to refill the valium because of the pt's drinking. Pt does not accept this.  Pt states that she needs something for anxiety. I offered pain management referral or psychiatry referral and pt declined both at this time, saying "I'm not going to them, they are just going to put me on morphine and all of that other crap". I advised her that Dr. Vickey Hugerohmeier recommended going to see Dr. Murray HodgkinsBartko. Pt states that she " is not going to see that quack. I sat in the lobby forever, and when I got to the room, there were needles everywhere and I was just treated like a number."  Pt does admit that she relapsed and starting drinking again. However, she reports this only lasted a week and has not had anything to drink in the past 4 days.  Pt states that she got two DUIs in [place] this summer (she says that she did not tell Aundra MilletMegan, NP this information, she would have told Dr. Vickey Hugerohmeier though) and now she has to go to court in [place] but is concerned that she might go to jail because she can't make it up there without her anxiety medication.  I asked her if she was able to make contact with any primary care doctors to take care of her medications. She says that she can't find anyone to take her NOW, they only will make appts 3-6 months out and this is too far away. Our office has already made an internal medicine referral for the pt last month. She needs someone NOW to help her.  Pt wishes to talk to Dr. Vickey Hugerohmeier and discuss other ways to manage her anxiety. She is taking vitamin B and is using lavender and meditation, she reports. She is also seeing a Veterinary surgeoncounselor.  I advised pt that I would again send this to Dr. Vickey Hugerohmeier. Pt asked me to put in a pain management referral. Referral placed.

## 2015-06-05 NOTE — Addendum Note (Signed)
Addended by: Geronimo RunningINKINS, Lima Chillemi A on: 06/05/2015 10:32 AM   Modules accepted: Orders

## 2015-06-06 ENCOUNTER — Encounter: Payer: Self-pay | Admitting: Neurology

## 2015-06-06 NOTE — Telephone Encounter (Signed)
She is at danger of overdosing and will be discharged from my practice for non compliance with medical advice. I would lose my medical  previledges over treateing an actively drinking alcoholic with valium. Especially since she has not used the medication in month, !!!

## 2015-06-20 NOTE — Telephone Encounter (Signed)
Error

## 2015-09-16 ENCOUNTER — Other Ambulatory Visit: Payer: Self-pay | Admitting: Cardiovascular Disease

## 2015-09-16 MED ORDER — HYDROCHLOROTHIAZIDE 25 MG PO TABS: 25.0000 mg | ORAL_TABLET | Freq: Every day | ORAL | Status: DC

## 2015-09-19 ENCOUNTER — Other Ambulatory Visit: Payer: Self-pay | Admitting: Cardiovascular Disease

## 2015-09-19 MED ORDER — METOPROLOL TARTRATE 25 MG PO TABS: ORAL_TABLET | ORAL | Status: DC

## 2015-11-11 ENCOUNTER — Other Ambulatory Visit: Payer: Self-pay | Admitting: Cardiovascular Disease

## 2015-12-04 ENCOUNTER — Other Ambulatory Visit: Payer: Self-pay | Admitting: *Deleted

## 2015-12-04 MED ORDER — METOPROLOL TARTRATE 25 MG PO TABS: 25.0000 mg | ORAL_TABLET | Freq: Two times a day (BID) | ORAL | Status: DC

## 2015-12-11 DIAGNOSIS — I10 Essential (primary) hypertension: Secondary | ICD-10-CM | POA: Diagnosis not present

## 2015-12-11 DIAGNOSIS — M5412 Radiculopathy, cervical region: Secondary | ICD-10-CM | POA: Diagnosis not present

## 2015-12-19 ENCOUNTER — Encounter: Payer: Self-pay | Admitting: Cardiovascular Disease

## 2015-12-19 ENCOUNTER — Ambulatory Visit (INDEPENDENT_AMBULATORY_CARE_PROVIDER_SITE_OTHER): Payer: BLUE CROSS/BLUE SHIELD | Admitting: Cardiovascular Disease

## 2015-12-19 VITALS — BP 110/74 | HR 65 | Ht 66.0 in | Wt 143.2 lb

## 2015-12-19 DIAGNOSIS — M5412 Radiculopathy, cervical region: Secondary | ICD-10-CM | POA: Diagnosis not present

## 2015-12-19 DIAGNOSIS — I10 Essential (primary) hypertension: Secondary | ICD-10-CM | POA: Diagnosis not present

## 2015-12-19 DIAGNOSIS — Z13220 Encounter for screening for lipoid disorders: Secondary | ICD-10-CM | POA: Diagnosis not present

## 2015-12-19 LAB — COMPREHENSIVE METABOLIC PANEL
ALT: 14 U/L (ref 6–29)
AST: 14 U/L (ref 10–35)
Albumin: 4 g/dL (ref 3.6–5.1)
Alkaline Phosphatase: 47 U/L (ref 33–130)
BUN: 17 mg/dL (ref 7–25)
CO2: 29 mmol/L (ref 20–31)
Calcium: 8.8 mg/dL (ref 8.6–10.4)
Chloride: 96 mmol/L — ABNORMAL LOW (ref 98–110)
Creat: 0.87 mg/dL (ref 0.50–1.05)
Glucose, Bld: 74 mg/dL (ref 65–99)
Potassium: 4 mmol/L (ref 3.5–5.3)
Sodium: 133 mmol/L — ABNORMAL LOW (ref 135–146)
Total Bilirubin: 0.3 mg/dL (ref 0.2–1.2)
Total Protein: 6.2 g/dL (ref 6.1–8.1)

## 2015-12-19 LAB — LIPID PANEL
Cholesterol: 204 mg/dL — ABNORMAL HIGH (ref 125–200)
HDL: 36 mg/dL — ABNORMAL LOW (ref >=46)
LDL Cholesterol: 115 mg/dL (ref <130)
Total CHOL/HDL Ratio: 5.7 Ratio — ABNORMAL HIGH (ref <=5.0)
Triglycerides: 266 mg/dL — ABNORMAL HIGH (ref <150)
VLDL: 53 mg/dL — ABNORMAL HIGH (ref <30)

## 2015-12-19 MED ORDER — HYDROCHLOROTHIAZIDE 25 MG PO TABS: 25.0000 mg | ORAL_TABLET | Freq: Every day | ORAL | Status: DC

## 2015-12-19 MED ORDER — METOPROLOL TARTRATE 25 MG PO TABS: 25.0000 mg | ORAL_TABLET | Freq: Two times a day (BID) | ORAL | Status: DC

## 2015-12-19 NOTE — Patient Instructions (Addendum)

## 2015-12-19 NOTE — Progress Notes (Signed)
[name] Date of Birth  [birth year]       [facility]    [place] [facility] [address], [address]  [address], [address] [place], [place]  [address]   [place], [place]  [id number] [phone]     [phone]   Fax  [phone]    Fax [phone]  Problem List: 1. HTN 2. Palpitations 3. Neck surgery 4. Peripheral neuropathy 5. Chest pain 6. Dynamic LVOT obstruction  CONCLUSIONS: 1. Smooth and normal coronary arteries. 2. Hyperdynamic left ventricular systolic function.  She most likely     has a dynamic left ventricular outflow tract gradient.  She will     probably benefit from beta-blockers and calcium channel blockers.      History of Present Illness:  [name] is a 55 yo with hx of HTN and palpitations.  We had her on metoprolol.  She ran out of her metoprolol 6 months ago.  She has had some palpitations.   She stopped smoking but re-started again.    She is feeling well now.  Walking on occasion. She sees Yolonda Kidaarmen Dohmeyer for her neuropathy.    She has occasional chest discomfort - last only a second or so.  Typically occurs after she twists her torso.    [name], [name] is seen after a 1 year absence Recently had bronchitis,  Still coughing. Still smoking a few cigarettes.  Trying to exercise regularly - typically swims regularly,  Limited by peripheral neuropathy.   Current Outpatient Prescriptions on File Prior to Visit  Medication Sig Dispense Refill  . acetaminophen (TYLENOL) 325 MG tablet Take 650 mg by mouth every 6 (six) hours as needed for mild pain, fever or headache.    . citalopram (CELEXA) 40 MG tablet Take 1 tablet (40 mg total) by mouth daily. 90 tablet 3  . hydrochlorothiazide (HYDRODIURIL) 25 MG tablet Take 1 tablet (25 mg total) by mouth daily. 90 tablet 0  . loperamide (IMODIUM A-D) 2 MG tablet Take 2 mg by mouth 4 (four) times daily as needed for diarrhea or loose stools.    Marland Kitchen. loratadine (SM LORATADINE) 5  MG/5ML syrup Take 5 mg by mouth as needed for allergies.     . Menthol, Topical Analgesic, (BIOFREEZE EX) Apply 1 application topically 2 (two) times daily as needed (for pain).    . Menthol, Topical Analgesic, (ICY HOT EX) Apply 1 patch topically daily as needed.    . metoprolol tartrate (LOPRESSOR) 25 MG tablet Take 1 tablet (25 mg total) by mouth 2 (two) times daily. 30 tablet 0   No current facility-administered medications on file prior to visit.    Allergies  Allergen Reactions  . Endocet [Oxycodone-Acetaminophen]     Rash,did not help with pain Rash,did not help with pain  . Neurontin [Gabapentin]     Sleepy all the time    Past Medical History  Diagnosis Date  . Chest pain     Normal heart catherization  . Diastolic dysfunction     with possible mild LVOT gradient  . Hypertension   . Back injury     Chronic  . Former cigarette smoker   . Deafness     Left ear  . Degenerative joint disease     with neck surgery  . Gestational diabetes   . Pneumothorax   . Pain     chronic   . Cardiomyopathy (HCC)     hypertension controlled-helped with edema-Dr Calliope Delangel  .  Headache(784.0)     migraines  . Fibromyalgia   . Leg cramps   . Respiratory disorder   . Depression   . Collapsed lung   . Neuropathy [facility])     Past Surgical History  Procedure Laterality Date  . Neck surgery    . Other surgical history      History of cervical and lumbar disk surgery  . Cardiac catheterization      Ejection Fraction 65-70%  . 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 Polypectomies      tubes  . Cervical fusion      C5-6    History  Smoking status  . Current Some Day Smoker  . Types: E-cigarettes  Smokeless tobacco  . Never Used    Comment: 1/2 pack daily,trying to quit    History  Alcohol Use  . Yes    Comment: consume 3 glasses of wine on weekends    Family History  Problem Relation Age of Onset  . Coronary artery disease Father   .  Parkinson's disease Mother   . Cancer Sister     breast  . Diabetes Sister   . Diabetes Brother     Reviw of Systems:  Reviewed in the HPI.  All other systems are negative.  Physical Exam: Blood pressure 110/74, pulse 65, height  (1.676 m), weight 143 lb 3.2 oz (64.955 kg). General: Well developed, well nourished, in no acute distress. Head: Normocephalic, atraumatic, sclera non-icteric, mucus membranes are moist,  Neck: Supple. Carotids are 2 + without bruits. No JVD  Lungs: Clear  Heart: RR, soft systolic murmur Abdomen: Soft, non-tender, non-distended with normal bowel sounds. Msk:  Strength and tone are normal  Extremities: No clubbing or cyanosis. No edema.  Distal pedal pulses are 2+ and equal  Neuro: CN II - XII intact.  Alert and oriented X 3.  Psych:  Normal   ECG ordered today shows NSR at 65. TWI in the inf. And lat leads No changes compared to  Sept. 5, 2014:  NSR at 84.  ST / T wave abnormality in the inferior and lateral leads. Some changes from eCG  2015  Assessment / Plan:   1. HTN - BP is well controlled .  2. Palpitations - stable  3. Neck surgery 4. Peripheral neuropathy - has some radiation of pain down her arms.   5. Chest pain  6. Dynamic LVOT obstruction    Bruk Tumolo, Deloris Ping, MD  12/19/2015 4:46 PM    [facility] [address] [place],  [address] [place], [place]  [address] Pager [phone] Phone: [phone]; Fax: [phone]

## 2015-12-23 ENCOUNTER — Telehealth: Payer: Self-pay | Admitting: Nurse Practitioner

## 2015-12-23 DIAGNOSIS — E785 Hyperlipidemia, unspecified: Secondary | ICD-10-CM

## 2015-12-23 MED ORDER — ATORVASTATIN CALCIUM 20 MG PO TABS: 20.0000 mg | ORAL_TABLET | Freq: Every day | ORAL | Status: DC

## 2015-12-23 NOTE — Telephone Encounter (Signed)
-----   Message from Vesta MixerPhilip J Nahser, MD sent at 12/20/2015  2:12 PM EDT ----- [name] and cholesterol are elevated  She should work on a better diet, Start atorvastatin  20 mg a day  Check labs in 3 months

## 2015-12-23 NOTE — Telephone Encounter (Signed)
Reviewed lab results and plan of care with patient who verbalized understanding and agreement to start atorvastatin 20 mg.  She is scheduled for repeat fasting lab appointment on 8/3.  I advised her to call back with questions or concerns and she thanked me for the call.

## 2015-12-24 ENCOUNTER — Emergency Department (HOSPITAL_BASED_OUTPATIENT_CLINIC_OR_DEPARTMENT_OTHER)
Admission: EM | Admit: 2015-12-24 | Discharge: 2015-12-24 | Disposition: A | Discharge status: Home / Self Care | Payer: BLUE CROSS/BLUE SHIELD | Attending: Emergency Medicine | Admitting: Emergency Medicine

## 2015-12-24 ENCOUNTER — Emergency Department (HOSPITAL_BASED_OUTPATIENT_CLINIC_OR_DEPARTMENT_OTHER): Payer: BLUE CROSS/BLUE SHIELD

## 2015-12-24 ENCOUNTER — Encounter (HOSPITAL_BASED_OUTPATIENT_CLINIC_OR_DEPARTMENT_OTHER): Payer: Self-pay

## 2015-12-24 DIAGNOSIS — F329 Major depressive disorder, single episode, unspecified: Secondary | ICD-10-CM | POA: Diagnosis not present

## 2015-12-24 DIAGNOSIS — Y929 Unspecified place or not applicable: Secondary | ICD-10-CM | POA: Diagnosis not present

## 2015-12-24 DIAGNOSIS — G629 Polyneuropathy, unspecified: Secondary | ICD-10-CM | POA: Insufficient documentation

## 2015-12-24 DIAGNOSIS — Z23 Encounter for immunization: Secondary | ICD-10-CM | POA: Insufficient documentation

## 2015-12-24 DIAGNOSIS — S51851A Open bite of right forearm, initial encounter: Secondary | ICD-10-CM | POA: Insufficient documentation

## 2015-12-24 DIAGNOSIS — S71152A Open bite, left thigh, initial encounter: Secondary | ICD-10-CM | POA: Diagnosis not present

## 2015-12-24 DIAGNOSIS — W540XXA Bitten by dog, initial encounter: Secondary | ICD-10-CM | POA: Diagnosis not present

## 2015-12-24 DIAGNOSIS — F17200 Nicotine dependence, unspecified, uncomplicated: Secondary | ICD-10-CM | POA: Insufficient documentation

## 2015-12-24 DIAGNOSIS — M79631 Pain in right forearm: Secondary | ICD-10-CM | POA: Diagnosis present

## 2015-12-24 DIAGNOSIS — I5030 Unspecified diastolic (congestive) heart failure: Secondary | ICD-10-CM | POA: Diagnosis not present

## 2015-12-24 DIAGNOSIS — I10 Essential (primary) hypertension: Secondary | ICD-10-CM | POA: Insufficient documentation

## 2015-12-24 DIAGNOSIS — Y939 Activity, unspecified: Secondary | ICD-10-CM | POA: Insufficient documentation

## 2015-12-24 DIAGNOSIS — Y999 Unspecified external cause status: Secondary | ICD-10-CM | POA: Insufficient documentation

## 2015-12-24 MED ORDER — HYDROCODONE-ACETAMINOPHEN 5-325 MG PO TABS
1.0000 | ORAL_TABLET | Freq: Once | ORAL | Status: AC
  Administered 2015-12-24: 1 via ORAL
  Filled 2015-12-24: qty 1

## 2015-12-24 MED ORDER — IBUPROFEN 400 MG PO TABS
600.0000 mg | ORAL_TABLET | Freq: Once | ORAL | Status: AC
  Administered 2015-12-24: 600 mg via ORAL
  Filled 2015-12-24: qty 1

## 2015-12-24 MED ORDER — AMOXICILLIN-POT CLAVULANATE 875-125 MG PO TABS: 1.0000 | ORAL_TABLET | Freq: Two times a day (BID) | ORAL | Status: DC

## 2015-12-24 MED ORDER — HYDROCODONE-ACETAMINOPHEN 5-325 MG PO TABS: 1.0000 | ORAL_TABLET | Freq: Four times a day (QID) | ORAL | Status: DC | PRN

## 2015-12-24 MED ORDER — AMOXICILLIN-POT CLAVULANATE 875-125 MG PO TABS
1.0000 | ORAL_TABLET | Freq: Two times a day (BID) | ORAL | Status: DC
  Administered 2015-12-24: 1 via ORAL
  Filled 2015-12-24: qty 1

## 2015-12-24 MED ORDER — TETANUS-DIPHTH-ACELL PERTUSSIS 5-2.5-18.5 LF-MCG/0.5 IM SUSP
0.5000 mL | Freq: Once | INTRAMUSCULAR | Status: AC
Start: 2015-12-24 — End: 2015-12-24
  Administered 2015-12-24: 0.5 mL via INTRAMUSCULAR
  Filled 2015-12-24: qty 0.5

## 2015-12-24 NOTE — Discharge Instructions (Signed)
Wash wounds daily with soap and water. Take the antibiotic as prescribed. Take Tylenol for mild pain or the pain medicine prescribed for bad pain. Don't take Tylenol together with the pain medicine prescribed as the combination can be dangerous to your liver. Return in 3 days or see an urgent care center to get your wounds rechecked. Return sooner if you develop fever vomiting, worsening pain or redness or are concerned for any reason.

## 2015-12-24 NOTE — ED Provider Notes (Addendum)
CSN: [id number]     Arrival date & time 12/24/15  1900 History  By signing my name below, I, Evelene Croon, attest that this documentation has been prepared under the direction and in the presence of Orlie Dakin, MD . Electronically Signed: Evelene Croon, Scribe. 12/24/2015. 9:35 PM.    Chief Complaint  Patient presents with  . Animal Bite   The history is provided by the patient. No language interpreter was used.     HPI Comments:  [name] is a 55 y.o. female who presents to the Emergency Department complaining of animal bitesto left thigh and rightForearm by family dog 3 days ago. Dog's immunizations are UTD. Pt reports constant moderate pain to the site with associated swelling. No alleviating factors noted. Pt has no other complaints or symptoms at this time. She denies h/o DM. Pt is a current smoker. Nothing makes symptoms better or worse. No treatment prior to coming here. No other associated symptoms  Past Medical History  Diagnosis Date  . Chest pain     Normal heart catherization  . Diastolic dysfunction     with possible mild LVOT gradient  . Hypertension   . Back injury     Chronic  . Former cigarette smoker   . Deafness     Left ear  . Degenerative joint disease     with neck surgery  . Gestational diabetes   . Pneumothorax   . Pain     chronic   . Cardiomyopathy ([place])     hypertension controlled-helped with edema-Dr Nahser  . Headache(784.0)     migraines  . Fibromyalgia   . Leg cramps   . Respiratory disorder   . Depression   . Collapsed lung   . Neuropathy [facility])    Past Surgical History  Procedure Laterality Date  . Neck surgery    . Other surgical history      History of cervical and lumbar disk surgery  . Cardiac catheterization      Ejection Fraction 65-70%  . External ear surgery      tumor at age 8, 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  . Polypectomies      tubes  . Cervical fusion      C5-6   Family History  Problem Relation Age of Onset  .  Coronary artery disease Father   . Parkinson's disease Mother   . Cancer Sister     breast  . Diabetes Sister   . Diabetes Brother    Social History  Substance Use Topics  . Smoking status: Current Some Day Smoker    Types: E-cigarettes  . Smokeless tobacco: Never Used     Comment: 1/2 pack daily,trying to quit  . Alcohol Use: Yes     Comment: consume 3 glasses of wine on weekends   OB History    No data available     Review of Systems  Constitutional: Negative.  Negative for fever.  HENT: Negative.   Respiratory: Negative.   Cardiovascular: Negative.   Gastrointestinal: Negative.   Musculoskeletal: Negative.   Skin: Positive for wound.  Neurological: Negative.   Psychiatric/Behavioral: Negative.   All other systems reviewed and are negative.  Allergies  Endocet and Neurontin  Home Medications   Prior to Admission medications   Medication Sig Start Date End Date Taking? Authorizing Provider  acetaminophen (TYLENOL) 325 MG tablet Take 650 mg by mouth every 6 (six) hours as needed for mild pain, fever or headache.    Historical  Provider, MD  atorvastatin (LIPITOR) 20 MG tablet Take 1 tablet (20 mg total) by mouth daily at 6 PM. 12/23/15   Thayer Headings, MD  citalopram (CELEXA) 40 MG tablet Take 1 tablet (40 mg total) by mouth daily. 05/22/15   Asencion Partridge Dohmeier, MD  hydrochlorothiazide (HYDRODIURIL) 25 MG tablet Take 1 tablet (25 mg total) by mouth daily. 12/19/15   Thayer Headings, MD  loperamide (IMODIUM A-D) 2 MG tablet Take 2 mg by mouth 4 (four) times daily as needed for diarrhea or loose stools.    Historical Provider, MD  loratadine (SM LORATADINE) 5 MG/5ML syrup Take 5 mg by mouth as needed for allergies.     Historical Provider, MD  Menthol, Topical Analgesic, (BIOFREEZE EX) Apply 1 application topically 2 (two) times daily as needed (for pain).    Historical Provider, MD  Menthol, Topical Analgesic, (ICY HOT EX) Apply 1 patch topically daily as needed.    Historical  Provider, MD  metoprolol tartrate (LOPRESSOR) 25 MG tablet Take 1 tablet (25 mg total) by mouth 2 (two) times daily. 12/19/15   Thayer Headings, MD   BP 124/78 mmHg  Pulse 62  Temp(Src) 97.9 F (36.6 C) (Oral)  Resp 18  Ht _0  (1.626 m)  Wt 144 lb 6.4 oz (65.499 kg)  BMI 24.77 kg/m2  SpO2 99% Physical Exam  Constitutional: She appears well-developed and well-nourished.  HENT:  Head: Normocephalic and atraumatic.  Eyes: Conjunctivae are normal. Pupils are equal, round, and reactive to light.  Neck: Neck supple. No tracheal deviation present. No thyromegaly present.  Cardiovascular: Normal rate and regular rhythm.   No murmur heard. Pulmonary/Chest: Effort normal and breath sounds normal.  Abdominal: Soft. Bowel sounds are normal. She exhibits no distension. There is no tenderness.  Musculoskeletal: Normal range of motion. She exhibits no edema or tenderness.  Right upper extremity several tiny scabbed wounds middle third dorsal aspect with surrounding redness warmth and tenderness. No drainage. No red streaks proximally. Radial pulse 2+ Left lower extremity medial thigh with multiple tiny scabbed lesions with surrounding redness warmth and tenderness. No red streaks proximally. No drainage. All other extremities without redness swelling or tenderness neurovascularly intact  Neurological: She is alert. Coordination normal.  Skin: Skin is warm and dry. No rash noted.  Reddened areas at right forearm and left thigh. See musculoskeletal exam  Psychiatric: She has a normal mood and affect.  Nursing note and vitals reviewed.   ED Course  Procedures    DIAGNOSTIC STUDIES:  Oxygen Saturation is 99% on RA, normal by my interpretation.    COORDINATION OF CARE:  9:31 PM Tetanus updated in ED today. Return precautions discussed.  Also dscussed treatment plan with pt at bedside and pt agreed to plan.  Labs Review Labs Reviewed - No data to display  Imaging Review Dg Forearm  Right  12/24/2015  CLINICAL DATA:  Dog bite to the mid forearm. EXAM: RIGHT FOREARM - 2 VIEW COMPARISON:  None. FINDINGS: There is no evidence of fracture or other focal bone lesions. Soft tissues are unremarkable. No radiopaque foreign body IMPRESSION: Negative. Electronically Signed   By: Andreas Newport M.D.   On: 12/24/2015 20:47   I have personally reviewed and evaluated these images and lab results as part of my medical decision-making.  X-ray viewed by me Results for orders placed or performed in visit on 12/19/15  Lipid Profile  Result Value Ref Range   Cholesterol 204 (H) 125 - 200 mg/dL  Triglycerides 266 (H) <150 mg/dL   HDL 36 (L) >=46 mg/dL   Total CHOL/HDL Ratio 5.7 (H) <=5.0 Ratio   VLDL 53 (H) <30 mg/dL   LDL Cholesterol 115 <130 mg/dL  Comp Met (CMET)  Result Value Ref Range   Sodium 133 (L) 135 - 146 mmol/L   Potassium 4.0 3.5 - 5.3 mmol/L   Chloride 96 (L) 98 - 110 mmol/L   CO2 29 20 - 31 mmol/L   Glucose, Bld 74 65 - 99 mg/dL   BUN 17 7 - 25 mg/dL   Creat 0.87 0.50 - 1.05 mg/dL   Total Bilirubin 0.3 0.2 - 1.2 mg/dL   Alkaline Phosphatase 47 33 - 130 U/L   AST 14 10 - 35 U/L   ALT 14 6 - 29 U/L   Total Protein 6.2 6.1 - 8.1 g/dL   Albumin 4.0 3.6 - 5.1 g/dL   Calcium 8.8 8.6 - 10.4 mg/dL   Dg Forearm Right  12/24/2015  CLINICAL DATA:  Dog bite to the mid forearm. EXAM: RIGHT FOREARM - 2 VIEW COMPARISON:  None. FINDINGS: There is no evidence of fracture or other focal bone lesions. Soft tissues are unremarkable. No radiopaque foreign body IMPRESSION: Negative. Electronically Signed   By: Andreas Newport M.D.   On: 12/24/2015 20:47    MDM  Plan prescriptions Augmentin, Norco. Recheck 3 days Patient reports that she's taken Norco in the past without difficulty or without allergic reaction. She does not know what her allergy is to oxycodone-APAP Final diagnoses:  None   She received tetanus immunization while here  Diagnosis #1 multiple dog bites #2  cellulitis  I personally performed the services described in this documentation, which was scribed in my presence. The recorded information has been reviewed and considered.    Orlie Dakin, MD 12/24/15 0097  Orlie Dakin, MD 12/24/15 2150

## 2015-12-24 NOTE — ED Notes (Signed)
Pt verbalizes understanding of d/c instructions and denies any further needs at this time. 

## 2015-12-24 NOTE — ED Notes (Signed)
Pt has puncture animal bite to right forearm and left thigh.  Slight swelling to right forearm and tenderness to wrist and forearm.  No signs of infection for either bite.  Pt was bitten by own animal.

## 2015-12-24 NOTE — ED Notes (Signed)
Pt was bit by own dogs on Sunday-right UE and left LE bites noted-increase in pain-NAD-steady gait

## 2015-12-27 ENCOUNTER — Other Ambulatory Visit: Payer: Self-pay | Admitting: Neurosurgery

## 2015-12-27 DIAGNOSIS — M5412 Radiculopathy, cervical region: Secondary | ICD-10-CM

## 2016-01-05 ENCOUNTER — Other Ambulatory Visit: Payer: BLUE CROSS/BLUE SHIELD

## 2016-01-10 ENCOUNTER — Ambulatory Visit
Admission: RE | Admit: 2016-01-10 | Discharge: 2016-01-10 | Disposition: A | Discharge status: Home / Self Care | Payer: BLUE CROSS/BLUE SHIELD | Source: Ambulatory Visit | Attending: Neurosurgery | Admitting: Neurosurgery

## 2016-01-10 DIAGNOSIS — M50222 Other cervical disc displacement at C5-C6 level: Secondary | ICD-10-CM | POA: Diagnosis not present

## 2016-01-10 DIAGNOSIS — M5412 Radiculopathy, cervical region: Secondary | ICD-10-CM

## 2016-01-10 DIAGNOSIS — M50223 Other cervical disc displacement at C6-C7 level: Secondary | ICD-10-CM | POA: Diagnosis not present

## 2016-01-13 DIAGNOSIS — M5412 Radiculopathy, cervical region: Secondary | ICD-10-CM | POA: Diagnosis not present

## 2016-02-06 DIAGNOSIS — M5412 Radiculopathy, cervical region: Secondary | ICD-10-CM | POA: Diagnosis not present

## 2016-02-19 ENCOUNTER — Other Ambulatory Visit: Payer: Self-pay | Admitting: Neurological Surgery

## 2016-02-20 ENCOUNTER — Telehealth: Payer: Self-pay | Admitting: Cardiovascular Disease

## 2016-02-20 ENCOUNTER — Encounter (HOSPITAL_COMMUNITY): Payer: Self-pay | Admitting: *Deleted

## 2016-02-20 NOTE — Telephone Encounter (Signed)
Per call from Dr Elsner,[name]'s office Neuro. Sur,    [name] needed  Surgical clearance for 6/30.  loosed the call they may have rescheduled.

## 2016-02-20 NOTE — Telephone Encounter (Addendum)
Janett Billow, RN from Dr Clarice Pole office is calling to obtain only cardiac clearance for this pt.  Pt is scheduled to have a C3 and C4 artificial disc replacement done tomorrow 6/30 at 3pm, by Neurosurgeon, Dr Ellene Route.  Janett Billow states they will not need medication clearance, just cardiac clearance.  Per Janett Billow, she apologizes for the short notice, but the pt told Dr Ellene Route that she had no cardiac issues, and she is currently not under the care of a Cardiologist.  Janett Billow states that the Anesthesiologist was reviewing this pts medical history, and noted that she is followed by Dr Acie Fredrickson.  Janett Billow would like for this clearance to be faxed to [facility] at [phone] Attention, and she would like a verbal call back as well at [phone]. Janett Billow states that the pt will undergo general anesthesia for 2 hours.  Per Janett Billow, if the clearance is not met, then they will need to reschedule the pts surgery.  Informed Lucina Mellow, that I will route this message to Dr Acie Fredrickson to review and advise on, and someone from the office will fax and follow-up with her thereafter.  Janett Billow verbalized understanding, agrees with this plan, and gracious for all the assistance provided with such short notice.

## 2016-02-20 NOTE — Progress Notes (Signed)
Pt denioes SOB and chest pain but is under the care of Dr. Elease HashimotoNahser, Cardiology. Pt stated that she was instructed to stop taking Aspirin, Cava Cava and vitamins. Pt also made aware to stop taking fish oil and other herbal medications in addition to NSAID's. Pt verbalized understanding of all pre-op instructions. Pt chart forwarded to anesthesia for review.

## 2016-02-20 NOTE — Progress Notes (Signed)
Anesthesia Chart Review:  Pt is a 55 year old female scheduled for C3-4 cervical artificial disc replacement on 02/21/2016 with Barnett AbuHenry Elsner, MD.  Pt is a same day work up.   Cardiologist is Kristeen MissPhilip Nahser, MD, last office visit 12/19/15.   PMH includes:  Cardiomyopathy, HTN, dynamic LVOT obstruction, PTSD, hx pneumothorax. Deaf in L ear. Current smoker. BMI 25  Medications include: lipitor, hctz, metoprolol  Labs will be obtained DOS.   EKG 12/19/15: NSR. ST and T wave abnormality consider inferior and anterolateral ischemia. No change compared to EKG 04/28/13 per Dr. Harvie BridgeNahser's interpretation.   Echo 05/05/13:  - Left ventricle: The cavity size was normal. Wall thickness was increased in a pattern of mild LVH. Systolic function was normal. The estimated ejection fraction was in the range of 55% to 60%.         Cardiac cath 06/13/09:  1. Smooth and normal coronary arteries 2. Hyperdynamic LV systolic function. She most likely has a dynamic LVOT gradient.   Reviewed case with Dr. Okey Dupreose.   If no changes, I anticipate pt can proceed with surgery as scheduled.   Rica Mastngela Kabbe, FNP-BC [facility]iology Phone: [phone] 02/20/2016 2:02 PM

## 2016-02-20 NOTE — Telephone Encounter (Signed)
Spoke with Shanda BumpsJessica at [facility] to inform her that we faxed this pts clearance note per Dr Elease HashimotoNahser, at information provided. Shanda BumpsJessica verbalized understanding, agrees with this plan, and gracious for all the assistance provided.

## 2016-02-20 NOTE — Telephone Encounter (Signed)
[name] is at low risk for her upcoming back surgery .

## 2016-02-21 ENCOUNTER — Encounter (HOSPITAL_COMMUNITY): Payer: Self-pay | Admitting: *Deleted

## 2016-02-21 ENCOUNTER — Ambulatory Visit (HOSPITAL_COMMUNITY): Payer: BLUE CROSS/BLUE SHIELD | Admitting: Emergency Medicine

## 2016-02-21 ENCOUNTER — Observation Stay (HOSPITAL_COMMUNITY)
Admission: RE | Admit: 2016-02-21 | Discharge: 2016-02-23 | Disposition: A | Discharge status: Home / Self Care | Payer: BLUE CROSS/BLUE SHIELD | Source: Ambulatory Visit | Attending: Neurological Surgery | Admitting: Neurological Surgery

## 2016-02-21 ENCOUNTER — Ambulatory Visit (HOSPITAL_COMMUNITY): Payer: BLUE CROSS/BLUE SHIELD

## 2016-02-21 ENCOUNTER — Encounter (HOSPITAL_COMMUNITY)
Admission: RE | Disposition: A | Discharge status: Home / Self Care | Payer: Self-pay | Source: Ambulatory Visit | Attending: Neurological Surgery

## 2016-02-21 DIAGNOSIS — Z981 Arthrodesis status: Secondary | ICD-10-CM | POA: Insufficient documentation

## 2016-02-21 DIAGNOSIS — I10 Essential (primary) hypertension: Secondary | ICD-10-CM | POA: Insufficient documentation

## 2016-02-21 DIAGNOSIS — M4712 Other spondylosis with myelopathy, cervical region: Secondary | ICD-10-CM | POA: Diagnosis not present

## 2016-02-21 DIAGNOSIS — I429 Cardiomyopathy, unspecified: Secondary | ICD-10-CM | POA: Insufficient documentation

## 2016-02-21 DIAGNOSIS — Z4789 Encounter for other orthopedic aftercare: Secondary | ICD-10-CM | POA: Diagnosis not present

## 2016-02-21 DIAGNOSIS — F17210 Nicotine dependence, cigarettes, uncomplicated: Secondary | ICD-10-CM | POA: Diagnosis not present

## 2016-02-21 DIAGNOSIS — M797 Fibromyalgia: Secondary | ICD-10-CM | POA: Diagnosis not present

## 2016-02-21 DIAGNOSIS — M5412 Radiculopathy, cervical region: Secondary | ICD-10-CM | POA: Diagnosis not present

## 2016-02-21 DIAGNOSIS — M47812 Spondylosis without myelopathy or radiculopathy, cervical region: Secondary | ICD-10-CM | POA: Diagnosis present

## 2016-02-21 DIAGNOSIS — H9192 Unspecified hearing loss, left ear: Secondary | ICD-10-CM | POA: Diagnosis not present

## 2016-02-21 DIAGNOSIS — Z79899 Other long term (current) drug therapy: Secondary | ICD-10-CM | POA: Diagnosis not present

## 2016-02-21 DIAGNOSIS — Z419 Encounter for procedure for purposes other than remedying health state, unspecified: Secondary | ICD-10-CM

## 2016-02-21 DIAGNOSIS — F329 Major depressive disorder, single episode, unspecified: Secondary | ICD-10-CM | POA: Diagnosis not present

## 2016-02-21 DIAGNOSIS — M4722 Other spondylosis with radiculopathy, cervical region: Secondary | ICD-10-CM | POA: Insufficient documentation

## 2016-02-21 HISTORY — DX: Family history of other specified conditions: Z84.89

## 2016-02-21 HISTORY — DX: Pneumonia, unspecified organism: J18.9

## 2016-02-21 HISTORY — DX: Personal history of other venous thrombosis and embolism: Z86.718

## 2016-02-21 HISTORY — DX: Post-traumatic stress disorder, unspecified: F43.10

## 2016-02-21 HISTORY — PX: CERVICAL DISC ARTHROPLASTY: SHX587

## 2016-02-21 LAB — BASIC METABOLIC PANEL
Anion gap: 9 (ref 5–15)
BUN: 12 mg/dL (ref 6–20)
CO2: 21 mmol/L — ABNORMAL LOW (ref 22–32)
Calcium: 8.9 mg/dL (ref 8.9–10.3)
Chloride: 102 mmol/L (ref 101–111)
Creatinine, Ser: 0.68 mg/dL (ref 0.44–1.00)
GFR calc Af Amer: >60 mL/min (ref >60)
GFR calc non Af Amer: >60 mL/min (ref >60)
Glucose, Bld: 91 mg/dL (ref 65–99)
Potassium: 6.2 mmol/L — ABNORMAL HIGH (ref 3.5–5.1)
Sodium: 132 mmol/L — ABNORMAL LOW (ref 135–145)

## 2016-02-21 LAB — CBC
HCT: 39.9 % (ref 36.0–46.0)
Hemoglobin: 13.6 g/dL (ref 12.0–15.0)
MCH: 32.9 pg (ref 26.0–34.0)
MCHC: 34.1 g/dL (ref 30.0–36.0)
MCV: 96.4 fL (ref 78.0–100.0)
Platelets: 284 10*3/uL (ref 150–400)
RBC: 4.14 MIL/uL (ref 3.87–5.11)
RDW: 13.2 % (ref 11.5–15.5)
WBC: 6.6 10*3/uL (ref 4.0–10.5)

## 2016-02-21 LAB — SURGICAL PCR SCREEN
MRSA, PCR: NEGATIVE
Staphylococcus aureus: NEGATIVE

## 2016-02-21 SURGERY — CERVICAL ANTERIOR DISC ARTHROPLASTY
Anesthesia: General | Site: Spine Cervical

## 2016-02-21 MED ORDER — CITALOPRAM HYDROBROMIDE 40 MG PO TABS
40.0000 mg | ORAL_TABLET | Freq: Every day | ORAL | Status: DC
  Administered 2016-02-22 – 2016-02-23 (×2): 40 mg via ORAL
  Filled 2016-02-21 (×3): qty 1

## 2016-02-21 MED ORDER — LOPERAMIDE HCL 2 MG PO CAPS: 2.0000 mg | ORAL_CAPSULE | Freq: Four times a day (QID) | ORAL | Status: DC | PRN

## 2016-02-21 MED ORDER — LACTATED RINGERS IV SOLN
INTRAVENOUS | Status: DC | PRN
  Administered 2016-02-21 (×2): via INTRAVENOUS

## 2016-02-21 MED ORDER — ONDANSETRON HCL 4 MG/2ML IJ SOLN
INTRAMUSCULAR | Status: DC | PRN
  Administered 2016-02-21: 4 mg via INTRAVENOUS

## 2016-02-21 MED ORDER — MUPIROCIN 2 % EX OINT
TOPICAL_OINTMENT | CUTANEOUS | Status: AC
  Administered 2016-02-21: 13:00:00
  Filled 2016-02-21: qty 22

## 2016-02-21 MED ORDER — THROMBIN 5000 UNITS EX SOLR
OROMUCOSAL | Status: DC | PRN
  Administered 2016-02-21: 19:00:00 via TOPICAL

## 2016-02-21 MED ORDER — MIDAZOLAM HCL 2 MG/2ML IJ SOLN
INTRAMUSCULAR | Status: AC
  Filled 2016-02-21: qty 2

## 2016-02-21 MED ORDER — ALUM & MAG HYDROXIDE-SIMETH 200-200-20 MG/5ML PO SUSP: 30.0000 mL | Freq: Four times a day (QID) | ORAL | Status: DC | PRN

## 2016-02-21 MED ORDER — LIDOCAINE-EPINEPHRINE 1 %-1:100000 IJ SOLN
INTRAMUSCULAR | Status: DC | PRN
  Administered 2016-02-21: 4.5 mL

## 2016-02-21 MED ORDER — ONDANSETRON HCL 4 MG/2ML IJ SOLN: 4.0000 mg | INTRAMUSCULAR | Status: DC | PRN

## 2016-02-21 MED ORDER — FENTANYL CITRATE (PF) 100 MCG/2ML IJ SOLN
INTRAMUSCULAR | Status: DC | PRN
  Administered 2016-02-21: 50 ug via INTRAVENOUS
  Administered 2016-02-21: 100 ug via INTRAVENOUS
  Administered 2016-02-21 (×2): 50 ug via INTRAVENOUS
  Administered 2016-02-21: 100 ug via INTRAVENOUS
  Administered 2016-02-21: 150 ug via INTRAVENOUS

## 2016-02-21 MED ORDER — LIDOCAINE HCL (CARDIAC) 20 MG/ML IV SOLN
INTRAVENOUS | Status: DC | PRN
  Administered 2016-02-21: 50 mg via INTRAVENOUS

## 2016-02-21 MED ORDER — HYDROCODONE-ACETAMINOPHEN 7.5-325 MG PO TABS: 1.0000 | ORAL_TABLET | Freq: Four times a day (QID) | ORAL | Status: DC | PRN

## 2016-02-21 MED ORDER — DOCUSATE SODIUM 100 MG PO CAPS
100.0000 mg | ORAL_CAPSULE | Freq: Two times a day (BID) | ORAL | Status: DC
  Administered 2016-02-21 – 2016-02-23 (×4): 100 mg via ORAL
  Filled 2016-02-21 (×4): qty 1

## 2016-02-21 MED ORDER — BUPIVACAINE HCL (PF) 0.5 % IJ SOLN
INTRAMUSCULAR | Status: DC | PRN
  Administered 2016-02-21: 4.5 mL

## 2016-02-21 MED ORDER — SENNA 8.6 MG PO TABS
1.0000 | ORAL_TABLET | Freq: Two times a day (BID) | ORAL | Status: DC
  Administered 2016-02-21 – 2016-02-23 (×4): 8.6 mg via ORAL
  Filled 2016-02-21 (×4): qty 1

## 2016-02-21 MED ORDER — METOCLOPRAMIDE HCL 5 MG/ML IJ SOLN
10.0000 mg | Freq: Once | INTRAMUSCULAR | Status: DC | PRN
Start: 2016-02-21 — End: 2016-02-21

## 2016-02-21 MED ORDER — OXYCODONE-ACETAMINOPHEN 5-325 MG PO TABS: 1.0000 | ORAL_TABLET | ORAL | Status: DC | PRN

## 2016-02-21 MED ORDER — ACETAMINOPHEN 325 MG PO TABS: 650.0000 mg | ORAL_TABLET | ORAL | Status: DC | PRN

## 2016-02-21 MED ORDER — CEFAZOLIN SODIUM-DEXTROSE 2-4 GM/100ML-% IV SOLN
INTRAVENOUS | Status: AC
  Administered 2016-02-21: 2 g via INTRAVENOUS
  Filled 2016-02-21: qty 100

## 2016-02-21 MED ORDER — FENTANYL CITRATE (PF) 250 MCG/5ML IJ SOLN
INTRAMUSCULAR | Status: AC
  Filled 2016-02-21: qty 5

## 2016-02-21 MED ORDER — FENTANYL CITRATE (PF) 100 MCG/2ML IJ SOLN
25.0000 ug | INTRAMUSCULAR | Status: DC | PRN
  Administered 2016-02-21 (×2): 50 ug via INTRAVENOUS

## 2016-02-21 MED ORDER — PROPOFOL 10 MG/ML IV BOLUS
INTRAVENOUS | Status: DC | PRN
  Administered 2016-02-21: 200 mg via INTRAVENOUS

## 2016-02-21 MED ORDER — SODIUM CHLORIDE 0.9 % IV SOLN
250.0000 mL | INTRAVENOUS | Status: DC
Start: 2016-02-21 — End: 2016-02-23

## 2016-02-21 MED ORDER — HYDROCODONE-ACETAMINOPHEN 5-325 MG PO TABS
ORAL_TABLET | ORAL | Status: AC
  Administered 2016-02-21: 22:00:00
  Filled 2016-02-21: qty 2

## 2016-02-21 MED ORDER — ROCURONIUM BROMIDE 100 MG/10ML IV SOLN
INTRAVENOUS | Status: DC | PRN
  Administered 2016-02-21: 10 mg via INTRAVENOUS
  Administered 2016-02-21: 30 mg via INTRAVENOUS

## 2016-02-21 MED ORDER — SODIUM CHLORIDE 0.9% FLUSH: 3.0000 mL | INTRAVENOUS | Status: DC | PRN

## 2016-02-21 MED ORDER — DIAZEPAM 5 MG PO TABS: 5.0000 mg | ORAL_TABLET | Freq: Four times a day (QID) | ORAL | Status: DC | PRN

## 2016-02-21 MED ORDER — SODIUM CHLORIDE 0.9% FLUSH
3.0000 mL | Freq: Two times a day (BID) | INTRAVENOUS | Status: DC
  Administered 2016-02-22 (×2): 3 mL via INTRAVENOUS

## 2016-02-21 MED ORDER — SUGAMMADEX SODIUM 200 MG/2ML IV SOLN
INTRAVENOUS | Status: DC | PRN
  Administered 2016-02-21: 132.4 mg via INTRAVENOUS

## 2016-02-21 MED ORDER — SUCCINYLCHOLINE CHLORIDE 20 MG/ML IJ SOLN
INTRAMUSCULAR | Status: DC | PRN
Start: 2016-02-21 — End: 2016-02-24
  Administered 2016-02-21: 100 mg via INTRAVENOUS

## 2016-02-21 MED ORDER — 0.9 % SODIUM CHLORIDE (POUR BTL) OPTIME
TOPICAL | Status: DC | PRN
  Administered 2016-02-21: 1000 mL

## 2016-02-21 MED ORDER — DEXAMETHASONE SODIUM PHOSPHATE 10 MG/ML IJ SOLN
INTRAMUSCULAR | Status: DC | PRN
  Administered 2016-02-21: 10 mg via INTRAVENOUS

## 2016-02-21 MED ORDER — ACETAMINOPHEN 10 MG/ML IV SOLN
INTRAVENOUS | Status: AC
  Administered 2016-02-21: 1000 mg via INTRAVENOUS
  Filled 2016-02-21: qty 100

## 2016-02-21 MED ORDER — METOPROLOL TARTRATE 25 MG PO TABS
25.0000 mg | ORAL_TABLET | Freq: Two times a day (BID) | ORAL | Status: DC
  Administered 2016-02-21 – 2016-02-23 (×4): 25 mg via ORAL
  Filled 2016-02-21 (×4): qty 1

## 2016-02-21 MED ORDER — LORATADINE 10 MG PO TABS
10.0000 mg | ORAL_TABLET | Freq: Every day | ORAL | Status: DC
  Administered 2016-02-22 – 2016-02-23 (×2): 10 mg via ORAL
  Filled 2016-02-21 (×2): qty 1

## 2016-02-21 MED ORDER — ACETAMINOPHEN 650 MG RE SUPP
650.0000 mg | RECTAL | Status: DC | PRN
Start: 2016-02-21 — End: 2016-02-23

## 2016-02-21 MED ORDER — HYDROCHLOROTHIAZIDE 25 MG PO TABS
25.0000 mg | ORAL_TABLET | Freq: Every day | ORAL | Status: DC
  Administered 2016-02-22 – 2016-02-23 (×2): 25 mg via ORAL
  Filled 2016-02-21 (×2): qty 1

## 2016-02-21 MED ORDER — MEPERIDINE HCL 25 MG/ML IJ SOLN: 6.2500 mg | INTRAMUSCULAR | Status: DC | PRN

## 2016-02-21 MED ORDER — METHOCARBAMOL 500 MG PO TABS
500.0000 mg | ORAL_TABLET | Freq: Four times a day (QID) | ORAL | Status: DC | PRN
  Administered 2016-02-21 – 2016-02-23 (×5): 500 mg via ORAL
  Filled 2016-02-21 (×4): qty 1

## 2016-02-21 MED ORDER — MENTHOL 3 MG MT LOZG: 1.0000 | LOZENGE | OROMUCOSAL | Status: DC | PRN

## 2016-02-21 MED ORDER — METHOCARBAMOL 500 MG PO TABS
ORAL_TABLET | ORAL | Status: AC
  Administered 2016-02-21: 22:00:00
  Filled 2016-02-21: qty 1

## 2016-02-21 MED ORDER — SODIUM CHLORIDE 0.9 % IR SOLN
Status: DC | PRN
  Administered 2016-02-21: 19:00:00

## 2016-02-21 MED ORDER — FENTANYL CITRATE (PF) 100 MCG/2ML IJ SOLN
INTRAMUSCULAR | Status: AC
  Administered 2016-02-21: 50 ug via INTRAVENOUS
  Filled 2016-02-21: qty 2

## 2016-02-21 MED ORDER — HYDROCODONE-ACETAMINOPHEN 5-325 MG PO TABS
1.0000 | ORAL_TABLET | ORAL | Status: DC | PRN
  Administered 2016-02-21 – 2016-02-22 (×4): 2 via ORAL
  Filled 2016-02-21 (×3): qty 2

## 2016-02-21 MED ORDER — MORPHINE SULFATE (PF) 2 MG/ML IV SOLN
1.0000 mg | INTRAVENOUS | Status: DC | PRN
  Administered 2016-02-21: 4 mg via INTRAVENOUS
  Administered 2016-02-22 (×2): 2 mg via INTRAVENOUS
  Administered 2016-02-22 (×2): 4 mg via INTRAVENOUS
  Filled 2016-02-21 (×2): qty 2
  Filled 2016-02-21 (×2): qty 1
  Filled 2016-02-21: qty 2

## 2016-02-21 MED ORDER — LACTATED RINGERS IV SOLN
INTRAVENOUS | Status: DC
  Administered 2016-02-21: 12:00:00 via INTRAVENOUS

## 2016-02-21 MED ORDER — HYDROCODONE-ACETAMINOPHEN 5-325 MG PO TABS: 1.0000 | ORAL_TABLET | ORAL | Status: DC | PRN

## 2016-02-21 MED ORDER — DEXTROSE 5 % IV SOLN
500.0000 mg | Freq: Four times a day (QID) | INTRAVENOUS | Status: DC | PRN
  Filled 2016-02-21: qty 5

## 2016-02-21 MED ORDER — DIPHENHYDRAMINE HCL 50 MG/ML IJ SOLN
INTRAMUSCULAR | Status: AC
  Administered 2016-02-21: 25 mg via INTRAVENOUS
  Filled 2016-02-21: qty 1

## 2016-02-21 MED ORDER — ATORVASTATIN CALCIUM 20 MG PO TABS
20.0000 mg | ORAL_TABLET | Freq: Every day | ORAL | Status: DC
  Administered 2016-02-22: 20 mg via ORAL
  Filled 2016-02-21: qty 1
  Filled 2016-02-21: qty 2
  Filled 2016-02-21 (×2): qty 1

## 2016-02-21 MED ORDER — LACTATED RINGERS IV SOLN: INTRAVENOUS | Status: DC

## 2016-02-21 MED ORDER — PHENOL 1.4 % MT LIQD
1.0000 | OROMUCOSAL | Status: DC | PRN
Start: 2016-02-21 — End: 2016-02-23

## 2016-02-21 MED ORDER — POLYETHYLENE GLYCOL 3350 17 G PO PACK
17.0000 g | PACK | Freq: Every day | ORAL | Status: DC | PRN
  Administered 2016-02-23: 17 g via ORAL
  Filled 2016-02-21 (×2): qty 1

## 2016-02-21 SURGICAL SUPPLY — 51 items
ADH SKN CLS APL DERMABOND .7 (GAUZE/BANDAGES/DRESSINGS) ×1
BAG DECANTER FOR FLEXI CONT (MISCELLANEOUS) ×3 IMPLANT
BIT DRILL NEURO 2X3.1 SFT TUCH (MISCELLANEOUS) ×1 IMPLANT
BNDG GAUZE ELAST 4 BULKY (GAUZE/BANDAGES/DRESSINGS) IMPLANT
BUR BARREL STRAIGHT FLUTE 4.0 (BURR) ×3 IMPLANT
CANISTER SUCT 3000ML PPV (MISCELLANEOUS) ×3 IMPLANT
DECANTER SPIKE VIAL GLASS SM (MISCELLANEOUS) ×3 IMPLANT
DERMABOND ADVANCED (GAUZE/BANDAGES/DRESSINGS) ×2
DERMABOND ADVANCED .7 DNX12 (GAUZE/BANDAGES/DRESSINGS) ×1 IMPLANT
DISC MOBI-C CERVICAL 13X15 H5 (Miscellaneous) ×2 IMPLANT
DRAPE C-ARM 42X72 X-RAY (DRAPES) ×6 IMPLANT
DRAPE C-ARMOR (DRAPES) ×3 IMPLANT
DRAPE LAPAROTOMY 100X72 PEDS (DRAPES) ×3 IMPLANT
DRAPE MICROSCOPE LEICA (MISCELLANEOUS) IMPLANT
DRAPE POUCH INSTRU U-SHP 10X18 (DRAPES) ×3 IMPLANT
DRILL NEURO 2X3.1 SOFT TOUCH (MISCELLANEOUS) ×3
DURAPREP 6ML APPLICATOR 50/CS (WOUND CARE) ×3 IMPLANT
ELECT REM PT RETURN 9FT ADLT (ELECTROSURGICAL) ×3
ELECTRODE REM PT RTRN 9FT ADLT (ELECTROSURGICAL) ×1 IMPLANT
GAUZE SPONGE 4X4 16PLY XRAY LF (GAUZE/BANDAGES/DRESSINGS) IMPLANT
GLOVE BIO SURGEON STRL SZ7.5 (GLOVE) IMPLANT
GLOVE BIOGEL PI IND STRL 7.5 (GLOVE) IMPLANT
GLOVE BIOGEL PI IND STRL 8.5 (GLOVE) ×1 IMPLANT
GLOVE BIOGEL PI INDICATOR 7.5 (GLOVE)
GLOVE BIOGEL PI INDICATOR 8.5 (GLOVE) ×2
GLOVE ECLIPSE 8.5 STRL (GLOVE) ×3 IMPLANT
GLOVE EXAM NITRILE LRG STRL (GLOVE) IMPLANT
GLOVE EXAM NITRILE MD LF STRL (GLOVE) IMPLANT
GLOVE EXAM NITRILE XL STR (GLOVE) IMPLANT
GLOVE EXAM NITRILE XS STR PU (GLOVE) IMPLANT
GOWN STRL REUS W/ TWL LRG LVL3 (GOWN DISPOSABLE) IMPLANT
GOWN STRL REUS W/ TWL XL LVL3 (GOWN DISPOSABLE) ×1 IMPLANT
GOWN STRL REUS W/TWL 2XL LVL3 (GOWN DISPOSABLE) ×3 IMPLANT
GOWN STRL REUS W/TWL LRG LVL3 (GOWN DISPOSABLE)
GOWN STRL REUS W/TWL XL LVL3 (GOWN DISPOSABLE) ×3
HALTER HD/CHIN CERV TRACTION D (MISCELLANEOUS) ×3 IMPLANT
HEMOSTAT POWDER KIT SURGIFOAM (HEMOSTASIS) ×6 IMPLANT
KIT BASIN OR (CUSTOM PROCEDURE TRAY) ×3 IMPLANT
KIT ROOM TURNOVER OR (KITS) ×3 IMPLANT
NDL SPNL 22GX3.5 QUINCKE BK (NEEDLE) ×1 IMPLANT
NEEDLE HYPO 22GX1.5 SAFETY (NEEDLE) ×3 IMPLANT
NEEDLE SPNL 22GX3.5 QUINCKE BK (NEEDLE) ×3 IMPLANT
NS IRRIG 1000ML POUR BTL (IV SOLUTION) ×3 IMPLANT
PACK LAMINECTOMY NEURO (CUSTOM PROCEDURE TRAY) ×3 IMPLANT
PAD ARMBOARD 7.5X6 YLW CONV (MISCELLANEOUS) ×9 IMPLANT
RUBBERBAND STERILE (MISCELLANEOUS) IMPLANT
SPONGE INTESTINAL PEANUT (DISPOSABLE) ×3 IMPLANT
SUT VIC AB 3-0 SH 8-18 (SUTURE) ×6 IMPLANT
TOWEL OR 17X24 6PK STRL BLUE (TOWEL DISPOSABLE) ×3 IMPLANT
TOWEL OR 17X26 10 PK STRL BLUE (TOWEL DISPOSABLE) ×3 IMPLANT
WATER STERILE IRR 1000ML POUR (IV SOLUTION) ×3 IMPLANT

## 2016-02-21 NOTE — Anesthesia Procedure Notes (Signed)
Procedure Name: Intubation Date/Time: 02/21/2016 6:13 PM Performed by: Gwenyth AllegraADAMI, Sefora Tietje Pre-anesthesia Checklist: Patient identified, Emergency Drugs available, Suction available and Patient being monitored Patient Re-evaluated:Patient Re-evaluated prior to inductionOxygen Delivery Method: Circle system utilized Preoxygenation: Pre-oxygenation with 100% oxygen Intubation Type: IV induction Ventilation: Mask ventilation without difficulty Laryngoscope Size: Glidescope and 3 Grade View: Grade I Tube type: Oral Tube size: 7.0 mm Airway Equipment and Method: Stylet and Video-laryngoscopy Placement Confirmation: ETT inserted through vocal cords under direct vision,  positive ETCO2 and breath sounds checked- equal and bilateral Secured at: 21 cm Tube secured with: Tape Dental Injury: Teeth and Oropharynx as per pre-operative assessment

## 2016-02-21 NOTE — Anesthesia Preprocedure Evaluation (Addendum)
Anesthesia Evaluation  Patient identified by MRN, date of birth, ID band Patient awake    Reviewed: Allergy & Precautions, NPO status , Patient's Chart, lab work & pertinent test results  Airway Mallampati: II  TM Distance: >3 FB Neck ROM: Full    Dental no notable dental hx. (+) Caps   Pulmonary Current Smoker,  H/o ptx   Pulmonary exam normal breath sounds clear to auscultation       Cardiovascular hypertension, Pt. on medications and Pt. on home beta blockers Normal cardiovascular exam Rhythm:Regular Rate:Normal     Neuro/Psych negative neurological ROS  negative psych ROS   GI/Hepatic negative GI ROS, Neg liver ROS,   Endo/Other    Renal/GU negative Renal ROS  negative genitourinary   Musculoskeletal negative musculoskeletal ROS (+) Fibromyalgia -  Abdominal   Peds negative pediatric ROS (+)  Hematology negative hematology ROS (+)   Anesthesia Other Findings   Reproductive/Obstetrics negative OB ROS                            Anesthesia Physical Anesthesia Plan  ASA: II  Anesthesia Plan: General   Post-op Pain Management:    Induction: Intravenous  Airway Management Planned: Oral ETT  Additional Equipment:   Intra-op Plan:   Post-operative Plan: Extubation in OR  Informed Consent: I have reviewed the patients History and Physical, chart, labs and discussed the procedure including the risks, benefits and alternatives for the proposed anesthesia with the patient or authorized representative who has indicated his/her understanding and acceptance.   Dental advisory given  Plan Discussed with: CRNA  Anesthesia Plan Comments:         Anesthesia Quick Evaluation

## 2016-02-21 NOTE — H&P (Signed)
[name] is an 55 y.o. female.   Chief Complaint: Neck shoulder and arm pain. Status post arthrodesis C4-5 C5-6 HPI: Patient is a 55 year old individual who's had significant discomfort in her neck shoulders and arms. She's had previous surgery but Dr. Hal Neer to include decompression and fusion at C4-5 and C5-6 there is adjacent level disease with particular spondylitic narrowing in the foramen at C3-C4. She also has some spondylitic disease at C6-C7 however her symptoms seem to be related to upper neck and shoulder pain without much distal radicular pain she's been advised regarding surgical decompression and arthroplasty at the level of C3-C4.  Past Medical History  Diagnosis Date  . Chest pain     Normal heart catherization  . Diastolic dysfunction     with possible mild LVOT gradient  . Hypertension   . Back injury     Chronic  . Former cigarette smoker   . Deafness     Left ear  . Degenerative joint disease     with neck surgery  . Gestational diabetes   . Pneumothorax   . Pain     chronic   . Cardiomyopathy ([place])     hypertension controlled-helped with edema-Dr Nahser  . Headache(784.0)     migraines  . Fibromyalgia   . Leg cramps   . Respiratory disorder   . Depression   . Collapsed lung   . Neuropathy ([place])   . Family history of adverse reaction to anesthesia     " my daughter has PONV and gets very grumpy"  . Pneumonia   . PTSD (post-traumatic stress disorder)   . H/O blood clots     due to side effect of medication    Past Surgical History  Procedure Laterality Date  . Neck surgery    . Other surgical history      History of cervical and lumbar disk surgery  . Cardiac catheterization      Ejection Fraction 65-70%  . 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 Polypectomies      tubes  . Cervical fusion      C5-6  . Hernia repair      " as a baby"  . Tonsillectomy and adenoidectomy    . Abdominal hysterectomy     partial    Family History  Problem Relation Age of Onset  . Coronary artery disease Father   . Parkinson's disease Mother   . Cancer Sister     breast  . Diabetes Sister   . Diabetes Brother    Social History:  reports that she has been smoking E-cigarettes and Cigarettes.  She has never used smokeless tobacco. She reports that she drinks alcohol. She reports that she does not use illicit drugs.  Allergies:  Allergies  Allergen Reactions  . Endocet [Oxycodone-Acetaminophen]     Rash,did not help with pain Rash,did not help with pain  . Neurontin [Gabapentin]     Sleepy all the time    Medications Prior to Admission  Medication Sig Dispense Refill  . atorvastatin (LIPITOR) 20 MG tablet Take 1 tablet (20 mg total) by mouth daily at 6 PM. 30 tablet 11  . citalopram (CELEXA) 40 MG tablet Take 1 tablet (40 mg total) by mouth daily. 90 tablet 3  . hydrochlorothiazide (HYDRODIURIL) 25 MG tablet Take 1 tablet (25 mg total) by mouth daily. 90 tablet 3  . HYDROcodone-acetaminophen (NORCO) 7.5-325 MG tablet Take  1-2 tablets by mouth every 6 (six) hours as needed.  0  . loperamide (IMODIUM A-D) 2 MG tablet Take 2 mg by mouth 4 (four) times daily as needed for diarrhea or loose stools.    Marland Kitchen loratadine (CLARITIN) 10 MG tablet Take 10 mg by mouth daily.    . Menthol, Topical Analgesic, (BIOFREEZE EX) Apply 1 application topically 2 (two) times daily as needed (for pain).    . Menthol, Topical Analgesic, (ICY HOT EX) Apply 1 patch topically daily as needed.    . metoprolol tartrate (LOPRESSOR) 25 MG tablet Take 1 tablet (25 mg total) by mouth 2 (two) times daily. 60 tablet 11  . HYDROcodone-acetaminophen (NORCO) 5-325 MG tablet Take 1-2 tablets by mouth every 6 (six) hours as needed for moderate pain or severe pain. (Patient not taking: Reported on 02/20/2016) 10 tablet 0    Results for orders placed or performed during the hospital encounter of 02/21/16 (from the past 48 hour(s))  CBC      Status: None   Collection Time: 02/21/16 12:37 PM  Result Value Ref Range   WBC 6.6 4.0 - 10.5 K/uL   RBC 4.14 3.87 - 5.11 MIL/uL   Hemoglobin 13.6 12.0 - 15.0 g/dL   HCT 39.9 36.0 - 46.0 %   MCV 96.4 78.0 - 100.0 fL   MCH 32.9 26.0 - 34.0 pg   MCHC 34.1 30.0 - 36.0 g/dL   RDW 13.2 11.5 - 15.5 %   Platelets 284 150 - 400 K/uL  Basic metabolic panel     Status: Abnormal   Collection Time: 02/21/16 12:37 PM  Result Value Ref Range   Sodium 132 (L) 135 - 145 mmol/L   Potassium 6.2 (H) 3.5 - 5.1 mmol/L    Comment: HEMOLYSIS AT THIS LEVEL MAY AFFECT RESULT   Chloride 102 101 - 111 mmol/L   CO2 21 (L) 22 - 32 mmol/L   Glucose, Bld 91 65 - 99 mg/dL   BUN 12 6 - 20 mg/dL   Creatinine, Ser 0.68 0.44 - 1.00 mg/dL   Calcium 8.9 8.9 - 10.3 mg/dL   GFR calc non Af Amer >60 >60 mL/min   GFR calc Af Amer >60 >60 mL/min    Comment: (NOTE) The eGFR has been calculated using the CKD EPI equation. This calculation has not been validated in all clinical situations. eGFR's persistently <60 mL/min signify possible Chronic Kidney Disease.    Anion gap 9 5 - 15  Surgical pcr screen     Status: None   Collection Time: 02/21/16  1:35 PM  Result Value Ref Range   MRSA, PCR NEGATIVE NEGATIVE   Staphylococcus aureus NEGATIVE NEGATIVE    Comment:        The Xpert SA Assay (FDA approved for NASAL specimens in patients over 55 years of age), is one component of a comprehensive surveillance program.  Test performance has been validated by [facility] for patients greater than or equal to 29 year old. It is not intended to diagnose infection nor to guide or monitor treatment.    No results found.  Review of Systems  Constitutional: Negative.   Eyes: Negative.   Respiratory: Negative.   Cardiovascular: Negative.   Gastrointestinal: Negative.   Genitourinary: Negative.   Musculoskeletal: Positive for neck pain.  Skin: Negative.   Neurological: Positive for headaches.       Numbness and  dysesthesias into both upper extremities  Endo/Heme/Allergies: Negative.   Psychiatric/Behavioral: Negative.  Blood pressure 146/80, pulse 78, temperature 98.9 F (37.2 C), resp. rate 20, height 5' 5.5" (1.664 m), weight 66.225 kg (146 lb), SpO2 100 %. Physical Exam  Constitutional: She is oriented to person, place, and time. She appears well-developed and well-nourished.  HENT:  Head: Normocephalic and atraumatic.  Eyes: Conjunctivae and EOM are normal. Pupils are equal, round, and reactive to light.  Neck:  Limited range of motion of the neck with Spurling's being positive on left side.  Cardiovascular: Normal rate and regular rhythm.   Respiratory: Effort normal and breath sounds normal.  GI: Soft. Bowel sounds are normal.  Neurological: She is alert and oriented to person, place, and time.  Strength testing appears good in the deltoids biceps triceps grips and intrinsics. Tone and bulk are intact. Tenderness is noted in the supraclavicular fossa is to deep palpation. Range of motion is limited in the cervical spine turning 45 left and right flexing and extending only 50% of normal  Skin: Skin is warm and dry.  Psychiatric: She has a normal mood and affect. Her behavior is normal. Judgment and thought content normal.     Assessment/Plan Cervical spondylosis with radiculopathy C3-C4, status post arthrodesis C4-5 C5-6.  Decompression and arthroplasty C3-C4.  Earleen Newport, MD 02/21/2016, 5:37 PM

## 2016-02-21 NOTE — Anesthesia Postprocedure Evaluation (Signed)
Anesthesia Post Note  Patient: [name]  Procedure(s) Performed: Procedure(s) (LRB): Cervical three-four artificial disc replacement (N/A)  Patient location during evaluation: PACU Anesthesia Type: General Level of consciousness: sedated Pain management: pain level controlled Vital Signs Assessment: post-procedure vital signs reviewed and stable Respiratory status: spontaneous breathing and respiratory function stable Cardiovascular status: stable Anesthetic complications: no    Last Vitals:  Filed Vitals:   02/21/16 2005 02/21/16 2020  BP: 156/74 142/69  Pulse: 101 85  Temp: 37.3 C   Resp: 28 18    Last Pain:  Filed Vitals:   02/21/16 2021  PainSc: 9                  Barack Nicodemus DANIEL

## 2016-02-21 NOTE — Progress Notes (Signed)
Patient ID: Jeanette GustHeather A Roberts, female   DOB: 05-24-1961, 55 y.o.   MRN: [id number] Vital signs are stable Appears to be doing reasonably well Plan discharge in morning

## 2016-02-21 NOTE — Op Note (Signed)
Date of surgery: 02/21/2016 Preoperative diagnosis: Cervical spondylosis C3-4 with radiculopathy status post arthrodesis C4-C6. Postoperative diagnosis: Cervical spondylosis C3-4 with cervical radiculopathy, status post arthrodesis C4-C6 Procedure: Anterior cervical decompression C3-C4 arthroplasty with Mobi-C [id number] implant Surgeon: Barnett AbuHenry Evyn Putzier First assistant: Daryl EasternGary Kram M.D. Anesthesia: Gen. endotracheal Indications: Jeanette GrumblingHeather Roberts is a 55 year old individual is had significant neck shoulder and arm pain. She's had previous decompression arthrodesis at C45 and C5-6 she has advanced spondylitic changes at C3-4 and was advised regarding an arthroplasty at this level. She also has spondylitic disease C6-C7 but does not appear to be had and C7 radicular changes.  Procedure: Patient was brought to the operating room supine on a stretcher. After the smooth induction of general endotracheal anesthesia she was placed on the table in a doughnut head rest. The neck was prepped with alcohol DuraPrep and draped in a sterile fashion. An incision was made in the transverse plane off to the left side at C3-C4. The dissection was carried down to the platysma the plane between the sternocleidomastoid and strap muscles dissected bluntly until the prevertebral space was reached. First identifiable disc space was noted immediately above the plate as that of C3-4. Is observed also that the plate at G6-4C3-4 overhung the superior endplate of C4. After careful examination is felt that be best to remove the plate so as not to interfere with the arthroplasty. The screws were removed and the plate was lifted without any difficulty. The disc space was then entered and a discectomy was carried out at C3-C4. The endplates were cleaned smoothly and then the disc space was held open with an interspace retractor. Posterior longitudinal ligament region was examined. An osteophyte from the inferior margin of the body of C3 was ground down  with a high-speed drill and 1.7 mm dissecting tool. The ligament was then lifted and opened from side to side. The foramen were decompressed with a 1 and 2 mm Kerrison punch. When an adequate decompression was obtained endplates were again examined and smoothed and then the interspace was sized for appropriately sized arthroplasty is felt that a 5 mm tall 15 mm wide 13 mm deep MOBI see arthroplasty would fit best into this interval. Then using fluoroscopic guidance in both the AP and lateral projections the arthroplasty device was placed into the interspace checking both AP and lateral alignment. When the device was seated posteriorly and final confirmation was obtained the device was released from its holder. The area was again examined hemostasis and the soft tissues around the device was obtained meticulously and there was irrigated copiously with antibiotic irrigating solution and finally the retractor was removed and the platysma was closed with 3-0 Vicryl in interrupted fashion and 3-0 Vicryl was used in the subcutaneous take her tissues. Blood loss for the procedure was estimated at approximately 25 mL.

## 2016-02-21 NOTE — Discharge Summary (Signed)
Physician Discharge Summary  Patient ID: Jeanette GustHeather A [place] MRN: [id number] DOB/AGE: [birth year] 55 y.o.  Admit date: 02/21/2016 Discharge date: 02/21/2016  Admission Diagnoses:Cervical spondylosis with radiculopathy C3-C4, myelopathy  Discharge Diagnoses: Cervical spondylosis with radiculopathy and myelopathy C3-C4, status post arthrodesis C4-C6  Active Problems:   Spondylosis of cervical joint   Discharged Condition: good  Hospital Course: Patient was admitted to undergo an arthroplasty at C3-C4 level with decompression of the C3-C4 joint. She tolerated surgery well  Consults: None  Significant Diagnostic Studies: labs: None  Treatments: surgery: Anterior cervical decompression C3-4 removal of plate from W0-J8C4-C5, arthroplasty C3-4  Discharge Exam: Blood pressure 128/71, pulse 82, temperature 99.1 F (37.3 C), resp. rate 16, height 5' 5.5" (1.664 m), weight 66.225 kg (146 lb), SpO2 99 %. Incision is clean and dry, motor function is intact  Disposition: 01-Home or Self Care  Discharge Instructions    Call MD for:  redness, tenderness, or signs of infection (pain, swelling, redness, odor or green/yellow discharge around incision site)    Complete by:  As directed      Call MD for:  severe uncontrolled pain    Complete by:  As directed      Call MD for:  temperature >100.4    Complete by:  As directed      Diet - low sodium heart healthy    Complete by:  As directed      Discharge instructions    Complete by:  As directed   Okay to shower. Do not apply salves or appointments to incision. No heavy lifting with the upper extremities greater than 15 pounds. May resume driving when not requiring pain medication and patient feels comfortable with doing so.     Increase activity slowly    Complete by:  As directed             Medication List    TAKE these medications        atorvastatin 20 MG tablet  Commonly known as:  LIPITOR  Take 1 tablet (20 mg total) by mouth daily at 6  PM.     BIOFREEZE EX  Apply 1 application topically 2 (two) times daily as needed (for pain).     citalopram 40 MG tablet  Commonly known as:  CELEXA  Take 1 tablet (40 mg total) by mouth daily.     hydrochlorothiazide 25 MG tablet  Commonly known as:  HYDRODIURIL  Take 1 tablet (25 mg total) by mouth daily.     HYDROcodone-acetaminophen 5-325 MG tablet  Commonly known as:  NORCO  Take 1-2 tablets by mouth every 6 (six) hours as needed for moderate pain or severe pain.     HYDROcodone-acetaminophen 7.5-325 MG tablet  Commonly known as:  NORCO  Take 1-2 tablets by mouth every 6 (six) hours as needed.     HYDROcodone-acetaminophen 5-325 MG tablet  Commonly known as:  NORCO/VICODIN  Take 1-2 tablets by mouth every 4 (four) hours as needed for moderate pain (mild pain).     ICY HOT EX  Apply 1 patch topically daily as needed.     loperamide 2 MG tablet  Commonly known as:  IMODIUM A-D  Take 2 mg by mouth 4 (four) times daily as needed for diarrhea or loose stools.     loratadine 10 MG tablet  Commonly known as:  CLARITIN  Take 10 mg by mouth daily.     metoprolol tartrate 25 MG tablet  Commonly known as:  LOPRESSOR  Take 1 tablet (25 mg total) by mouth 2 (two) times daily.         SignedStefani Dama: Lacey Wallman J 02/21/2016, 9:26 PM

## 2016-02-22 DIAGNOSIS — I429 Cardiomyopathy, unspecified: Secondary | ICD-10-CM | POA: Diagnosis not present

## 2016-02-22 DIAGNOSIS — Z981 Arthrodesis status: Secondary | ICD-10-CM | POA: Diagnosis not present

## 2016-02-22 DIAGNOSIS — F17210 Nicotine dependence, cigarettes, uncomplicated: Secondary | ICD-10-CM | POA: Diagnosis not present

## 2016-02-22 DIAGNOSIS — M4722 Other spondylosis with radiculopathy, cervical region: Secondary | ICD-10-CM | POA: Diagnosis not present

## 2016-02-22 DIAGNOSIS — M4712 Other spondylosis with myelopathy, cervical region: Secondary | ICD-10-CM | POA: Diagnosis not present

## 2016-02-22 DIAGNOSIS — H9192 Unspecified hearing loss, left ear: Secondary | ICD-10-CM | POA: Diagnosis not present

## 2016-02-22 DIAGNOSIS — F329 Major depressive disorder, single episode, unspecified: Secondary | ICD-10-CM | POA: Diagnosis not present

## 2016-02-22 DIAGNOSIS — I10 Essential (primary) hypertension: Secondary | ICD-10-CM | POA: Diagnosis not present

## 2016-02-22 DIAGNOSIS — Z79899 Other long term (current) drug therapy: Secondary | ICD-10-CM | POA: Diagnosis not present

## 2016-02-22 MED ORDER — PREDNISONE 5 MG (21) PO TBPK: 5.0000 mg | ORAL_TABLET | Freq: Four times a day (QID) | ORAL | Status: DC

## 2016-02-22 MED ORDER — PREDNISONE 5 MG (21) PO TBPK: 10.0000 mg | ORAL_TABLET | Freq: Every evening | ORAL | Status: DC

## 2016-02-22 MED ORDER — PREDNISONE 5 MG (21) PO TBPK: 5.0000 mg | ORAL_TABLET | ORAL | Status: DC

## 2016-02-22 MED ORDER — PREDNISONE 5 MG (21) PO TBPK
5.0000 mg | ORAL_TABLET | ORAL | Status: DC
Start: 2016-02-23 — End: 2016-02-23

## 2016-02-22 MED ORDER — PREDNISONE 5 MG (21) PO TBPK
10.0000 mg | ORAL_TABLET | Freq: Every morning | ORAL | Status: DC
  Filled 2016-02-22: qty 21

## 2016-02-22 MED ORDER — PREDNISONE 5 MG (21) PO TBPK
10.0000 mg | ORAL_TABLET | Freq: Every morning | ORAL | Status: AC
Start: 2016-02-23 — End: 2016-02-23
  Administered 2016-02-23: 10 mg via ORAL
  Filled 2016-02-22 (×2): qty 21

## 2016-02-22 MED ORDER — OXYCODONE HCL 5 MG PO TABS
15.0000 mg | ORAL_TABLET | ORAL | Status: DC | PRN
  Administered 2016-02-22 – 2016-02-23 (×5): 15 mg via ORAL
  Filled 2016-02-22 (×5): qty 3

## 2016-02-22 MED ORDER — PREDNISONE 5 MG PO TABS
10.0000 mg | ORAL_TABLET | Freq: Once | ORAL | Status: AC
  Administered 2016-02-22: 10 mg via ORAL
  Filled 2016-02-22: qty 2

## 2016-02-22 MED ORDER — PREDNISONE 5 MG (21) PO TBPK: 5.0000 mg | ORAL_TABLET | Freq: Three times a day (TID) | ORAL | Status: DC

## 2016-02-22 NOTE — Progress Notes (Signed)
Call place to neurosurgery on call after multiple attempts to manage the patients pain well enough for her to discharge. Patient states trhat she does not feel comfortable enough to go home alone, request additional stay

## 2016-02-22 NOTE — Progress Notes (Signed)
Call placed to Neuro surgery oncall, Dr. Wynetta Emeryram returned the called. Advised that the patient refused to be discharged stating that her pain was not managed. New orders received and the patients MAR updated

## 2016-02-22 NOTE — Evaluation (Signed)
Physical Therapy Evaluation Patient Details Name: Jeanette GustHeather A Roberts MRN: [id number] DOB: 1961-05-17 Today's Date: 02/22/2016   History of Present Illness  Pt is a 55 y.o. female s/p Cervical three-four artificial disc replacement. PMHx: Chest pain, Diastolic dysfunction, HTN, Chronic back injury, Fibromyalgia, Depression, Neuropathy, PTSD, h/o blood clots.   Clinical Impression  Patient seen for evaluation and education s/p cervical surgery. Patient is mobilizing well, ambulated  In hall and performed stairs without issue. Patient receptive to education. No further acute PT needs. Will sign fof.     Follow Up Recommendations No PT follow up;Supervision - Intermittent    Equipment Recommendations  None recommended by PT    Recommendations for Other Services       Precautions / Restrictions Precautions Precautions: Cervical;Fall Precaution Comments: Reviewed cervical precautions with pt and provided handout. Restrictions Weight Bearing Restrictions: No      Mobility  Bed Mobility Overal bed mobility: Modified Independent             General bed mobility comments: HOB elevated. Educated pt on log roll technique.  Transfers Overall transfer level: Needs assistance Equipment used: None Transfers: Sit to/from Stand Sit to Stand: Supervision         General transfer comment: Supervision for safety. Pt reports slight dizziness with initial sit to stand from EOB.  Ambulation/Gait Ambulation/Gait assistance: Supervision Ambulation Distance (Feet): 240 Feet Assistive device: None Gait Pattern/deviations: Step-through pattern;Decreased stride length Gait velocity: decreased   General Gait Details: slow but steady gait, 3 standing rest breaks due to dizziness  Stairs Stairs: Yes Stairs assistance: Supervision Stair Management: One rail Left Number of Stairs: 3 General stair comments: educated on technique for step to method  Wheelchair Mobility    Modified Rankin  (Stroke Patients Only)       Balance Overall balance assessment: No apparent balance deficits (not formally assessed)                                           Pertinent Vitals/Pain Pain Score: 7     Home Living Family/patient expects to be discharged to:: Private residence Living Arrangements: Spouse/significant other Available Help at Discharge: Family Type of Home: House Home Access: Stairs to enter Entrance Stairs-Rails: Can reach both Entrance Stairs-Number of Steps: 3 Home Layout: One level Home Equipment: Grab bars - tub/shower      Prior Function Level of Independence: Independent               Hand Dominance   Dominant Hand: Right    Extremity/Trunk Assessment                      Cervical / Trunk Assessment: Other exceptions  Communication   Communication: No difficulties  Cognition Arousal/Alertness: Awake/alert Behavior During Therapy: WFL for tasks assessed/performed Overall Cognitive Status: Within Functional Limits for tasks assessed                      General Comments      Exercises        Assessment/Plan    PT Assessment Patent does not need any further PT services  PT Diagnosis Difficulty walking;Abnormality of gait;Acute pain   PT Problem List Decreased strength;Decreased activity tolerance;Decreased balance;Decreased knowledge of precautions;Pain  PT Treatment Interventions     PT Goals (Current goals can be found in the Care  Plan section) Acute Rehab PT Goals PT Goal Formulation: All assessment and education complete, DC therapy    Frequency     Barriers to discharge        Co-evaluation               End of Session Equipment Utilized During Treatment: Gait belt Activity Tolerance: Patient tolerated treatment well Patient left: in chair;with call bell/phone within reach Nurse Communication: Mobility status         Time: [id number] PT Time Calculation (min) (ACUTE ONLY):  23 min   Charges:   PT Evaluation $PT Eval Low Complexity: 1 Procedure     PT G Codes:   PT G-Codes **NOT FOR INPATIENT CLASS** Functional Assessment Tool Used: clinical judgement Functional Limitation: Mobility: Walking and moving around Mobility: Walking and Moving Around Current Status ([id number](G8978): At least 1 percent but less than 20 percent impaired, limited or restricted Mobility: Walking and Moving Around Goal Status [phone]([id number]): At least 1 percent but less than 20 percent impaired, limited or restricted Mobility: Walking and Moving Around Discharge Status [phone](G8980): At least 1 percent but less than 20 percent impaired, limited or restricted    Fabio AsaWerner, Lamya Lausch J 02/22/2016, 8:21 AM Charlotte Crumbevon Felma Pfefferle, PT DPT  [phone]

## 2016-02-22 NOTE — Evaluation (Signed)
Occupational Therapy Evaluation Patient Details Name: Jeanette GustHeather A Roberts MRN: [id number] DOB: 21-Jun-1961 Today's Date: 02/22/2016    History of Present Illness Pt is a 55 y.o. female s/p Cervical three-four artificial disc replacement. PMHx: Chest pain, Diastolic dysfunction, HTN, Chronic back injury, Fibromyalgia, Depression, Neuropathy, PTSD, h/o blood clots.    Clinical Impression   Pt reports she was independent with ADLs PTA. Currently pt overall supervision for safety with ADLs and functional mobility. Completed all back, safety, and ADL education with pt. Pt planning to d/c home with 24/7 supervision from her husband. No further acute OT needs identified; signing off at this time. Please re-consult if needs change. Thank you for this referral.     Follow Up Recommendations  No OT follow up;Supervision/Assistance - 24 hour (initially)    Equipment Recommendations  None recommended by OT    Recommendations for Other Services       Precautions / Restrictions Precautions Precautions: Cervical;Fall Precaution Comments: Reviewed cervical precautions with pt and provided handout. Restrictions Weight Bearing Restrictions: No      Mobility Bed Mobility Overal bed mobility: Modified Independent             General bed mobility comments: HOB elevated. Educated pt on log roll technique.  Transfers Overall transfer level: Needs assistance Equipment used: None Transfers: Sit to/from Stand Sit to Stand: Supervision         General transfer comment: Supervision for safety. Pt reports slight dizziness with initial sit to stand from EOB.    Balance Overall balance assessment: No apparent balance deficits (not formally assessed)                                          ADL Overall ADL's : Needs assistance/impaired Eating/Feeding: Independent;Sitting   Grooming: Supervision/safety;Standing Grooming Details (indicate cue type and reason): Educated pt on  use of 2 cups for oral care. Upper Body Bathing: Supervision/ safety;Standing Upper Body Bathing Details (indicate cue type and reason): Educated pt on use of hand held shower head for washing hair and being careful to maintain precautions. Lower Body Bathing: Supervison/ safety;Sit to/from stand   Upper Body Dressing : Supervision/safety;Set up;Standing Upper Body Dressing Details (indicate cue type and reason): to don hospital gown Lower Body Dressing: Supervision/safety;Sit to/from stand Lower Body Dressing Details (indicate cue type and reason): Pt able to cross foot over opposite knee to don sock. Pt reports husband can assist as needed; no need for AE Toilet Transfer: Supervision/safety;Ambulation;Regular Teacher, adult educationToilet Toilet Transfer Details (indicate cue type and reason): Simulated by sit to stand from EOB Toileting- Clothing Manipulation and Hygiene: Supervision/safety;Sit to/from stand   Tub/ Shower Transfer: Min guard;Ambulation;Tub transfer Tub/Shower Transfer Details (indicate cue type and reason): Min guard for safety. Discussed husband supervising during tub transfer; pt agreeable. Functional mobility during ADLs: Supervision/safety General ADL Comments: Educated pt on maintaining cervical precautions during functional activities. Pt reported slight dizziness with initial sit to stand from EOB; no unsteadiness noted during functional activities.     Vision Vision Assessment?: No apparent visual deficits   Perception     Praxis      Pertinent Vitals/Pain Pain Assessment: 0-10 Pain Score: 7  Pain Location: neck Pain Descriptors / Indicators: Sore Pain Intervention(s): Monitored during session;Repositioned     Hand Dominance Right   Extremity/Trunk Assessment Upper Extremity Assessment Upper Extremity Assessment: Overall WFL for tasks assessed   Lower  Extremity Assessment Lower Extremity Assessment: Defer to PT evaluation   Cervical / Trunk Assessment Cervical / Trunk  Assessment: Other exceptions Cervical / Trunk Exceptions: s/p cervical sx   Communication Communication Communication: No difficulties   Cognition Arousal/Alertness: Awake/alert Behavior During Therapy: WFL for tasks assessed/performed Overall Cognitive Status: Within Functional Limits for tasks assessed                     General Comments       Exercises       Shoulder Instructions      Home Living Family/patient expects to be discharged to:: Private residence Living Arrangements: Spouse/significant other Available Help at Discharge: Family Type of Home: House Home Access: Stairs to enter Secretary/administratorntrance Stairs-Number of Steps: 3 Entrance Stairs-Rails: Can reach both Home Layout: One level     Bathroom Shower/Tub: Chief Strategy OfficerTub/shower unit   Bathroom Toilet: Standard     Home Equipment: Grab bars - tub/shower          Prior Functioning/Environment Level of Independence: Independent             OT Diagnosis: Acute pain   OT Problem List:     OT Treatment/Interventions:      OT Goals(Current goals can be found in the care plan section) Acute Rehab OT Goals Patient Stated Goal: home today OT Goal Formulation: All assessment and education complete, DC therapy  OT Frequency:     Barriers to D/C:            Co-evaluation              End of Session Nurse Communication: Mobility status  Activity Tolerance: Patient tolerated treatment well Patient left: in chair;with call bell/phone within reach;with chair alarm set;with nursing/sitter in room   Time: [phone] OT Time Calculation (min): 20 min Charges:  OT General Charges $OT Visit: 1 Procedure OT Evaluation $OT Eval Low Complexity: 1 Procedure G-Codes: OT G-codes **NOT FOR INPATIENT CLASS** Functional Assessment Tool Used: Clinical judgement Functional Limitation: Self care Self Care Current Status (Y7829(G8987): At least 1 percent but less than 20 percent impaired, limited or restricted Self Care  Goal Status (F6213(G8988): At least 1 percent but less than 20 percent impaired, limited or restricted Self Care Discharge Status [phone]([phone]): At least 1 percent but less than 20 percent impaired, limited or restricted   Gaye AlkenBailey A Cherrie Franca M.S., OTR/L Pager: [phone]  02/22/2016, 8:23 AM

## 2016-02-23 DIAGNOSIS — I429 Cardiomyopathy, unspecified: Secondary | ICD-10-CM | POA: Diagnosis not present

## 2016-02-23 DIAGNOSIS — Z79899 Other long term (current) drug therapy: Secondary | ICD-10-CM | POA: Diagnosis not present

## 2016-02-23 DIAGNOSIS — F329 Major depressive disorder, single episode, unspecified: Secondary | ICD-10-CM | POA: Diagnosis not present

## 2016-02-23 DIAGNOSIS — I10 Essential (primary) hypertension: Secondary | ICD-10-CM | POA: Diagnosis not present

## 2016-02-23 DIAGNOSIS — M4712 Other spondylosis with myelopathy, cervical region: Secondary | ICD-10-CM | POA: Diagnosis not present

## 2016-02-23 DIAGNOSIS — M4722 Other spondylosis with radiculopathy, cervical region: Secondary | ICD-10-CM | POA: Diagnosis not present

## 2016-02-23 DIAGNOSIS — H9192 Unspecified hearing loss, left ear: Secondary | ICD-10-CM | POA: Diagnosis not present

## 2016-02-23 DIAGNOSIS — F17210 Nicotine dependence, cigarettes, uncomplicated: Secondary | ICD-10-CM | POA: Diagnosis not present

## 2016-02-23 DIAGNOSIS — Z981 Arthrodesis status: Secondary | ICD-10-CM | POA: Diagnosis not present

## 2016-02-23 MED ORDER — OXYCODONE HCL 15 MG PO TABS: 15.0000 mg | ORAL_TABLET | ORAL | Status: DC | PRN

## 2016-02-23 NOTE — Discharge Summary (Signed)
  Much better with pain management neurologically intact will discharge home.

## 2016-02-23 NOTE — Progress Notes (Signed)
Patient ID: Jeanette GustHeather A Lundquist, female   DOB: [birth year], 55 y.o.   MRN: [id number] Doing much better with pain management this morning discharged home

## 2016-02-23 NOTE — Progress Notes (Signed)
Patient is being d/c home. D/c instructions given and patient verbalized understanding. 

## 2016-02-24 ENCOUNTER — Encounter (HOSPITAL_COMMUNITY): Payer: Self-pay | Admitting: Neurological Surgery

## 2016-02-24 NOTE — Transfer of Care (Signed)
Immediate Anesthesia Transfer of Care Note  Patient: Jeanette GustHeather A Roberts  Procedure(s) Performed: Procedure(s): Cervical three-four artificial disc replacement (N/A)  Patient Location: PACU  Anesthesia Type:General  Level of Consciousness: awake, alert  and oriented  Airway & Oxygen Therapy: Patient Spontanous Breathing and Patient connected to nasal cannula oxygen  Post-op Assessment: Report given to RN and Post -op Vital signs reviewed and stable  Post vital signs: Reviewed and stable  Last Vitals:  Filed Vitals:   02/23/16 0541 02/23/16 1023  BP: 151/64 131/75  Pulse: 65 76  Temp: 36.9 C 36.9 C  Resp: 18 18    Last Pain:  Filed Vitals:   02/23/16 1023  PainSc: 4       Patients Stated Pain Goal: 5 (02/22/16 0622)  Complications: No apparent anesthesia complications

## 2016-02-26 NOTE — Addendum Note (Signed)
Addendum  created 02/26/16 1406 by Karely RobertsJames Ronalee Scheunemann, MD   Modules edited: Anesthesia Responsible Staff

## 2016-03-12 DIAGNOSIS — M5412 Radiculopathy, cervical region: Secondary | ICD-10-CM | POA: Diagnosis not present

## 2016-03-26 ENCOUNTER — Other Ambulatory Visit: Payer: BLUE CROSS/BLUE SHIELD

## 2016-04-15 DIAGNOSIS — M5412 Radiculopathy, cervical region: Secondary | ICD-10-CM | POA: Diagnosis not present

## 2016-04-23 DIAGNOSIS — M5412 Radiculopathy, cervical region: Secondary | ICD-10-CM | POA: Diagnosis not present

## 2016-04-29 ENCOUNTER — Other Ambulatory Visit (HOSPITAL_COMMUNITY): Payer: Self-pay | Admitting: Neurological Surgery

## 2016-04-29 ENCOUNTER — Other Ambulatory Visit: Payer: Self-pay | Admitting: Neurological Surgery

## 2016-04-29 DIAGNOSIS — M5412 Radiculopathy, cervical region: Secondary | ICD-10-CM

## 2016-04-29 DIAGNOSIS — M5416 Radiculopathy, lumbar region: Secondary | ICD-10-CM

## 2016-05-10 ENCOUNTER — Other Ambulatory Visit: Payer: Self-pay | Admitting: Neurology

## 2016-05-10 DIAGNOSIS — F17209 Nicotine dependence, unspecified, with unspecified nicotine-induced disorders: Secondary | ICD-10-CM

## 2016-05-10 DIAGNOSIS — R292 Abnormal reflex: Secondary | ICD-10-CM

## 2016-05-10 DIAGNOSIS — IMO0001 Reserved for inherently not codable concepts without codable children: Secondary | ICD-10-CM

## 2016-05-10 DIAGNOSIS — M542 Cervicalgia: Secondary | ICD-10-CM

## 2016-05-10 DIAGNOSIS — R208 Other disturbances of skin sensation: Secondary | ICD-10-CM

## 2016-05-14 ENCOUNTER — Ambulatory Visit (HOSPITAL_COMMUNITY)
Admission: RE | Admit: 2016-05-14 | Discharge: 2016-05-14 | Disposition: A | Discharge status: Home / Self Care | Payer: BLUE CROSS/BLUE SHIELD | Source: Ambulatory Visit | Attending: Neurological Surgery | Admitting: Neurological Surgery

## 2016-05-14 ENCOUNTER — Ambulatory Visit (HOSPITAL_COMMUNITY): Payer: BLUE CROSS/BLUE SHIELD

## 2016-05-14 ENCOUNTER — Encounter (HOSPITAL_COMMUNITY): Payer: Self-pay

## 2016-05-14 DIAGNOSIS — M5134 Other intervertebral disc degeneration, thoracic region: Secondary | ICD-10-CM | POA: Insufficient documentation

## 2016-05-14 DIAGNOSIS — M5416 Radiculopathy, lumbar region: Secondary | ICD-10-CM

## 2016-05-14 DIAGNOSIS — M5412 Radiculopathy, cervical region: Secondary | ICD-10-CM

## 2016-05-14 DIAGNOSIS — M4726 Other spondylosis with radiculopathy, lumbar region: Secondary | ICD-10-CM | POA: Diagnosis not present

## 2016-05-14 DIAGNOSIS — M5116 Intervertebral disc disorders with radiculopathy, lumbar region: Secondary | ICD-10-CM | POA: Diagnosis not present

## 2016-05-14 DIAGNOSIS — M47892 Other spondylosis, cervical region: Secondary | ICD-10-CM | POA: Insufficient documentation

## 2016-05-14 DIAGNOSIS — M4722 Other spondylosis with radiculopathy, cervical region: Secondary | ICD-10-CM | POA: Diagnosis not present

## 2016-05-14 DIAGNOSIS — M5114 Intervertebral disc disorders with radiculopathy, thoracic region: Secondary | ICD-10-CM | POA: Diagnosis not present

## 2016-05-14 DIAGNOSIS — M47812 Spondylosis without myelopathy or radiculopathy, cervical region: Secondary | ICD-10-CM | POA: Diagnosis not present

## 2016-05-14 MED ORDER — LIDOCAINE HCL (PF) 1 % IJ SOLN
2.0000 mL | Freq: Once | INTRAMUSCULAR | Status: AC
  Administered 2016-05-14: 2 mL via INTRADERMAL

## 2016-05-14 MED ORDER — LIDOCAINE HCL 1 % IJ SOLN
INTRAMUSCULAR | Status: AC
  Filled 2016-05-14: qty 10

## 2016-05-14 MED ORDER — DIAZEPAM 5 MG PO TABS
10.0000 mg | ORAL_TABLET | Freq: Once | ORAL | Status: AC
  Administered 2016-05-14: 10 mg via ORAL
  Filled 2016-05-14: qty 2

## 2016-05-14 MED ORDER — HYDROCODONE-ACETAMINOPHEN 5-325 MG PO TABS
ORAL_TABLET | ORAL | Status: AC
  Filled 2016-05-14: qty 2

## 2016-05-14 MED ORDER — IOPAMIDOL (ISOVUE-M 300) INJECTION 61%
INTRAMUSCULAR | Status: AC
  Administered 2016-05-14: 8 mL via INTRATHECAL
  Filled 2016-05-14: qty 15

## 2016-05-14 MED ORDER — IOPAMIDOL (ISOVUE-M 300) INJECTION 61%
15.0000 mL | Freq: Once | INTRAMUSCULAR | Status: AC | PRN
  Administered 2016-05-14: 8 mL via INTRATHECAL

## 2016-05-14 MED ORDER — DIAZEPAM 5 MG PO TABS
ORAL_TABLET | ORAL | Status: AC
  Administered 2016-05-14: 10 mg via ORAL
  Filled 2016-05-14: qty 2

## 2016-05-14 MED ORDER — HYDROCODONE-ACETAMINOPHEN 5-325 MG PO TABS
1.0000 | ORAL_TABLET | ORAL | Status: DC | PRN
  Administered 2016-05-14: 2 via ORAL

## 2016-05-14 MED ORDER — ONDANSETRON HCL 4 MG/2ML IJ SOLN: 4.0000 mg | Freq: Four times a day (QID) | INTRAMUSCULAR | Status: DC | PRN

## 2016-05-14 NOTE — Procedures (Signed)
Jeanette GrumblingHeather Roberts is a 55 year old individual who's had significant cervical radiculopathy in the past. She also has lumbar radiculopathy with centralized back pain. She underwent an arthroplasty in the cervical spine previously. Repeat imaging is desired and because of the nature of the arthroplasty is felt that myelogram and post myelogram CAT scan will provide better imaging. A total myelogram is now being performed.  Pre op Dx: Cervical spondylosis with radiculopathy, lumbar spondylosis with radiculopathy Post op Dx: Same Procedure: Total myelogram Surgeon: Kasim Mccorkle Puncture level: L2-3 Fluid color: Clear colorless Injection: Isovue-300, 8 mL Findings: Moderate spondylosis in the lumbar spine. Moderate spondylosis in the cervical spine. Further workup with CT scanning.

## 2016-05-14 NOTE — Discharge Instructions (Signed)
Myelography, Care After °These instructions give you information on caring for yourself after your procedure. Your doctor may also give you more specific instructions. Call your doctor if you have any problems or questions after your procedure. °HOME CARE °· Rest the first day. °· When you rest, lie flat, with your head slightly raised (elevated). °· Avoid heavy lifting and activity for 48 hours, or as told by your doctor. °· You may take the bandage (dressing) off one day after the test, or as told by your doctor. °· Take all medicines only as told by your doctor. °· Ask your doctor when it is okay to take a shower or bath. °· Ask your doctor when your test results will be ready and how you can get them. Make sure you follow up and get your results. °· Do not drink alcohol for 24 hours, or as told by your doctor. °· Drink enough fluid to keep your pee (urine) clear or pale yellow. °GET HELP IF:  °· You have a fever. °· You have a headache. °· You feel sick to your stomach (nauseous) or throw up (vomit). °· You have pain or cramping in your belly (abdomen). °GET HELP RIGHT AWAY IF:  °· You have a headache with a stiff neck or fever. °· You have trouble breathing. °· Any of the places where the needles were put in are: °¨ Puffy (swollen) or red. °¨ Sore or hot to the touch. °¨ Draining yellowish-white fluid (pus). °¨ Bleeding. °MAKE SURE YOU: °· Understand these instructions. °· Will watch your condition. °· Will get help right away if you are not doing well or get worse. °  °This information is not intended to replace advice given to you by your health care provider. Make sure you discuss any questions you have with your health care provider. °  °Document Released: 05/19/2008 Document Revised: 08/31/2014 Document Reviewed: 05/04/2012 °Elsevier Interactive Patient Education ©2016 [employer] ° °

## 2016-05-15 ENCOUNTER — Other Ambulatory Visit: Payer: Self-pay | Admitting: Neurology

## 2016-05-15 DIAGNOSIS — F17209 Nicotine dependence, unspecified, with unspecified nicotine-induced disorders: Secondary | ICD-10-CM

## 2016-05-15 DIAGNOSIS — R292 Abnormal reflex: Secondary | ICD-10-CM

## 2016-05-15 DIAGNOSIS — R208 Other disturbances of skin sensation: Secondary | ICD-10-CM

## 2016-05-15 DIAGNOSIS — IMO0001 Reserved for inherently not codable concepts without codable children: Secondary | ICD-10-CM

## 2016-05-15 DIAGNOSIS — M542 Cervicalgia: Secondary | ICD-10-CM

## 2016-05-19 ENCOUNTER — Ambulatory Visit: Payer: BLUE CROSS/BLUE SHIELD | Admitting: Neurology

## 2016-05-22 DIAGNOSIS — M129 Arthropathy, unspecified: Secondary | ICD-10-CM | POA: Diagnosis not present

## 2016-05-26 ENCOUNTER — Telehealth: Payer: Self-pay | Admitting: Internal Medicine

## 2016-05-26 NOTE — Telephone Encounter (Signed)
[facility] if we have response from Dr. Drue NovelPaz if he will accept pt. Her husband currently sees Dr. Drue NovelPaz Bennetta Laos(Dan Ohm 11/22/1956). Please notify Aram BeechamCynthia and contact patient at home # to est care if approved.  Adding Katie as she has Lexicographercontact info for Aram BeechamCynthia and took call 05/25/16.

## 2016-05-26 NOTE — Telephone Encounter (Signed)
Magnus IvanCalled Cynthia at [phone] and LVM for her to let her know that Dr. Drue NovelPaz is not currently accepting new patients. However if she would like to get patient scheduled with one of our providers who are accepting new patients I could help her with that.

## 2016-05-26 NOTE — Telephone Encounter (Signed)
Currently not taking  new patients however other practitioners in the office are open.

## 2016-06-11 ENCOUNTER — Telehealth: Payer: Self-pay | Admitting: General Practice

## 2016-06-11 NOTE — Telephone Encounter (Signed)
Patient informed regarding 05/26/16 telephone note response. Patient scheduled with Dr. Carmelia RollerWendling for 06/19/2016 new patient appointment.

## 2016-06-11 NOTE — Telephone Encounter (Signed)
Relation to pt:self  Call back numberZO:[phone]   Reason for call:  Bennetta Laosan [place] patient of yours referred he's wife to establish care with you, please advise.

## 2016-06-11 NOTE — Telephone Encounter (Signed)
See telephone note 05/26/2016, Dr. Drue NovelPaz currently not accepting new Pt's at this time.

## 2016-06-19 ENCOUNTER — Telehealth: Payer: Self-pay | Admitting: *Deleted

## 2016-06-19 ENCOUNTER — Encounter: Payer: Self-pay | Admitting: Family Medicine

## 2016-06-19 ENCOUNTER — Encounter: Payer: Self-pay | Admitting: *Deleted

## 2016-06-19 ENCOUNTER — Ambulatory Visit (INDEPENDENT_AMBULATORY_CARE_PROVIDER_SITE_OTHER): Payer: BLUE CROSS/BLUE SHIELD | Admitting: Family Medicine

## 2016-06-19 VITALS — BP 126/74 | HR 109 | Temp 97.7°F | Resp 14 | Ht 65.0 in | Wt 152.1 lb

## 2016-06-19 DIAGNOSIS — Z23 Encounter for immunization: Secondary | ICD-10-CM | POA: Diagnosis not present

## 2016-06-19 DIAGNOSIS — Z0000 Encounter for general adult medical examination without abnormal findings: Secondary | ICD-10-CM | POA: Diagnosis not present

## 2016-06-19 DIAGNOSIS — Z13220 Encounter for screening for lipoid disorders: Secondary | ICD-10-CM

## 2016-06-19 DIAGNOSIS — M129 Arthropathy, unspecified: Secondary | ICD-10-CM | POA: Diagnosis not present

## 2016-06-19 DIAGNOSIS — M797 Fibromyalgia: Secondary | ICD-10-CM | POA: Diagnosis not present

## 2016-06-19 LAB — SEDIMENTATION RATE: Sed Rate: 1 mm/hr (ref 0–30)

## 2016-06-19 NOTE — Telephone Encounter (Signed)
Pre-Visit Call completed with patient and chart updated.   Pre-Visit Info documented in Specialty Comments under [employer].    

## 2016-06-19 NOTE — Progress Notes (Signed)
Pre visit review using our clinic review tool, if applicable. No additional management support is needed unless otherwise documented below in the visit note. 

## 2016-06-19 NOTE — Addendum Note (Signed)
Addended by: Kandace BlitzLONG, Anisa Leanos M on: 06/19/2016 04:43 PM   Modules accepted: Orders

## 2016-06-19 NOTE — Progress Notes (Signed)
Chief Complaint  Patient presents with  . Establish Care    [name]- 4-5 years ago     Well Woman ISYSS [name] is here for a complete physical.   Her last physical was >1 year(s) ago.  Current diet: in general, a "healthy" diet  . Current exercise: Daily- walks dog. Weight is stable and she denies daytime fatigue. Her last pap smear was normal- 5 years ago- Follows with [name] and is going to schedule an appt for pap and mammogram.   No LMP recorded. Patient has had a hysterectomy..  Follow with a Dentist? No-going to set up.   Follow with an Optometrist? No-going to set up Seat belt? Yes Concerns: None  Past Medical History:  Diagnosis Date  . Back injury    Chronic  . Cardiomyopathy (HCC)    hypertension controlled-helped with edema-Dr Nahser  . Chest pain    Normal heart catherization  . Collapsed lung   . Deafness    Left ear  . Degenerative joint disease    with neck surgery  . Depression   . Diastolic dysfunction    with possible mild LVOT gradient  . Family history of adverse reaction to anesthesia    " my daughter has PONV and gets very grumpy"  . Fibromyalgia   . Former cigarette smoker   . Gestational diabetes   . H/O blood clots    due to side effect of medication  . Headache(784.0)    migraines  . Hypertension   . Leg cramps   . Neuropathy (HCC)   . Pain    chronic   . Pneumonia   . Pneumothorax   . PTSD (post-traumatic stress disorder)   . Respiratory disorder     Past Surgical History:  Procedure Laterality Date  . ABDOMINAL HYSTERECTOMY     partial  . CARDIAC CATHETERIZATION     Ejection Fraction 65-70%  . CERVICAL DISC ARTHROPLASTY N/A 02/21/2016   Procedure: Cervical three-four artificial disc replacement;  Surgeon: Jeanette Abu, MD;  Location: [facility];  Service: Neurosurgery;  Laterality: N/A;  . CERVICAL FUSION     C5-6  . EXTERNAL EAR SURGERY     tumor at age 43, 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  . HERNIA REPAIR      " as a baby"  . NECK SURGERY    . OTHER SURGICAL HISTORY     History of cervical and lumbar disk surgery  . polypectomies     tubes  . TONSILLECTOMY AND ADENOIDECTOMY     Medications  Current Outpatient Prescriptions on File Prior to Visit  Medication Sig Dispense Refill  . atorvastatin (LIPITOR) 20 MG tablet Take 1 tablet (20 mg total) by mouth daily at 6 PM. 30 tablet 11  . hydrochlorothiazide (HYDRODIURIL) 25 MG tablet Take 1 tablet (25 mg total) by mouth daily. 90 tablet 3  . HYDROcodone-acetaminophen (NORCO) 7.5-325 MG tablet Take 1-2 tablets by mouth every 6 (six) hours as needed for moderate pain.   0  . Kava, Piper methysticum, (KAVA KAVA PO) Take 1 each by mouth daily.    Marland Kitchen loperamide (IMODIUM A-D) 2 MG tablet Take 2 mg by mouth 4 (four) times daily as needed for diarrhea or loose stools.    Marland Kitchen loratadine (CLARITIN) 10 MG tablet Take 10 mg by mouth daily.    . magnesium gluconate (MAGONATE) 500 MG tablet Take 500 mg by mouth daily.    . Melatonin 5 MG CAPS Take 5-10 mg  by mouth at bedtime as needed (for sleep).    . Menthol, Topical Analgesic, (BIOFREEZE EX) Apply 1 application topically 2 (two) times daily as needed (for pain).    . Menthol, Topical Analgesic, (ICY HOT EX) Apply 1 patch topically daily as needed (for pain).     . metoprolol tartrate (LOPRESSOR) 25 MG tablet Take 1 tablet (25 mg total) by mouth 2 (two) times daily. 60 tablet 11  . vitamin B-12 (CYANOCOBALAMIN) 500 MCG tablet Take 500 mcg by mouth daily.     Allergies Allergies  Allergen Reactions  . Endocet [Oxycodone-Acetaminophen] Other (See Comments)    Rash,did not help with pain Rash,did not help with pain  . Neurontin [Gabapentin] Other (See Comments)    Sleepy all the time    Review of Systems: Constitutional:  no unexpected change in weight, no weakness, no unexplained fevers, sweats, or chills Eye:  no recent significant change in vision Ear/Nose/Mouth/Throat:  Ears:  no tinnitus or vertigo  and no recent change in hearing, Nose/Mouth/Throat:  +intermittent nasal congestion, no discharge, +sore throat (due to procedures on neck), no recent change in voice or hoarseness Cardiovascular:  no exercise intolerance, no chest pain, no palpitations Respiratory:  no chronic cough, sputum, or hemoptysis and no shortness of breath Gastrointestinal:  no abdominal pain, no change in bowel habits, no significant change in appetite, no nausea, vomiting, diarrhea, or constipation and no black or bloody stool GU:  Female: negative for dysuria, frequency, and incontinence, Normal menses; no abnormal bleeding, pelvic pain, or discharge Musculoskeletal/Extremities:  +chronic pain in legs and neck Integumentary (Skin/Breast):  no abnormal skin lesions reported, no new breast lumps or masses Neurologic:  no chronic headaches, +chronic numbness/tingling, no tremor Psychiatric:  no anxiety, no depression Endocrine:  denies fatigue, weight changes, heat/cold intolerance, bowel or skin changes, or cardiovascular system symptoms Hematologic/Lymphatic:  no abnormal bleeding, no HIV risk factors, no night sweats, no swollen nodes, no weight loss Allergic/Immunologic:  no history of food or environmental allergies  Exam BP 126/74 (BP Location: Left Arm, Patient Position: Sitting, Cuff Size: Normal)   Pulse (!) 109   Temp 97.7 F (36.5 C) (Oral)   Resp 14   Ht '5\' 5"'$  (1.651 m)   Wt 152 lb 2 oz (69 kg)   SpO2 90%   BMI 25.31 kg/m  General:  well developed, well nourished, in no apparent distress Skin:  no significant moles, warts, or growths Head:  no masses, lesions, or tenderness Eyes:  pupils equal and round, sclera anicteric without injection Ears:  canals without lesions, TMs shiny on R with infection, L is retracted and with much scar tissue noted Nose:  nares patent, septum midline, mucosa normal, and no drainage or sinus tenderness Throat/Pharynx:  lips and gingiva without lesion; tongue and uvula  midline; non-inflamed pharynx; no exudates or postnasal drainage Neck: neck supple without adenopathy, thyromegaly, or masses Breasts:  inspection negative, no nipple discharge or bleeding, no masses or nodularity palpable Thorax:  nontender Lungs:  clear to auscultation, breath sounds equal bilaterally, no respiratory distress Cardio:  regular rate and rhythm without murmurs, heart sounds without clicks or rubs, point of maximal impulse normal; no lifts, heaves, or thrills Abdomen:  abdomen soft, nontender; bowel sounds normal; no masses or organomegaly Genital: Defer to GYN Musculoskeletal:  symmetrical muscle groups noted without atrophy or deformity, 4/5 strength with knee flexion/extension b/l, 5/5 strength throughout otherwise Extremities:  no clubbing, cyanosis, or edema, no deformities, no skin discoloration Neuro:  gait normal; deep tendon reflexes normal and symmetric Psych: well oriented with normal range of affect and appropriate judgment/insight  Assessment and Plan  Well adult exam  Arthropathy - Plan: Sed Rate (ESR), C-reactive protein, Rheumatoid Factor, Antinuclear Antib (ANA)  Screening cholesterol level - Plan: Lipid panel   Well 55 y.o. female. Counseled on diet and exercise. Need to keep exercising with her FM diagnosis Immunizations, labs, and further orders as above- ordered AI labs on behalf of her pain specialist. Follow up in 6 mo to recheck BP. The patient voiced understanding and agreement to the plan.  [place], DO 06/19/16 4:17 PM

## 2016-06-20 LAB — LIPID PANEL
Cholesterol: 173 mg/dL (ref 125–200)
HDL: 47 mg/dL (ref >=46)
LDL Cholesterol: 96 mg/dL (ref <130)
Total CHOL/HDL Ratio: 3.7 Ratio (ref <=5.0)
Triglycerides: 150 mg/dL — ABNORMAL HIGH (ref <150)
VLDL: 30 mg/dL (ref <30)

## 2016-06-20 LAB — C-REACTIVE PROTEIN: CRP: 2.3 mg/L (ref <8.0)

## 2016-06-20 LAB — RHEUMATOID FACTOR: Rhuematoid fact SerPl-aCnc: <14 IU/mL (ref <14)

## 2016-06-22 LAB — ANTI-NUCLEAR AB-TITER (ANA TITER): ANA Titer 1: 1:80 {titer} — ABNORMAL HIGH

## 2016-06-22 LAB — ANA: Anti Nuclear Antibody(ANA): POSITIVE — AB

## 2016-06-22 NOTE — Progress Notes (Signed)
LVMOM for return call. TL/CMA

## 2016-07-09 ENCOUNTER — Telehealth: Payer: Self-pay

## 2016-07-09 MED ORDER — CITALOPRAM HYDROBROMIDE 20 MG PO TABS: 20.0000 mg | ORAL_TABLET | Freq: Every day | ORAL | 1 refills | Status: DC

## 2016-07-09 NOTE — Telephone Encounter (Signed)
Patient returned call informed patient of message below

## 2016-07-09 NOTE — Telephone Encounter (Signed)
OK. Call in 20 mg, take 1/2 tab daily for the first 2 weeks. Schedule her with me in 4 weeks. Thanks.

## 2016-07-09 NOTE — Addendum Note (Signed)
Addended by: Kandace BlitzLONG, Donn Wilmot M on: 07/09/2016 01:04 PM   Modules accepted: Orders

## 2016-07-09 NOTE — Telephone Encounter (Signed)
Pt called into the office stating that she would like a refill on Celexa. Pt states her brother passed right before she came into to see us and it has been hard. Pt reports waking up out of her sleep in a panic and had been concerned. I did advise patient that she might need to come in to be evaluated since Dr. Carmelia RollerWendling has not seen her for this issue before. Please advise any further recommendation. TL/CMA

## 2016-07-09 NOTE — Telephone Encounter (Signed)
I have filled Rx for Celexa 20 mg 1 tablet by mouth daily. #30 tablets with 1 refill. Per Dr. Carmelia RollerWendling recommendations.   I have attempted to contact patient to inform her of this. LVMOM for return call. Pt needsto follow up in 4weeks. TL/CMA

## 2016-07-30 ENCOUNTER — Telehealth: Payer: Self-pay | Admitting: Family Medicine

## 2016-07-30 NOTE — Telephone Encounter (Signed)
Needs appt. TY.

## 2016-07-30 NOTE — Telephone Encounter (Signed)
Patient was prescribed Lexapro and it kicked in about a week and a half ago. Patient states that she is still having a lot panic attacks. She is wondering there is something else that she can start taking to get these attacks under control. Please advise.   [id number] [place] - [phone] [facility] [address]  Patient phone: [phone]

## 2016-07-30 NOTE — Telephone Encounter (Signed)
Patient called stating that her labs needed to be faxed to Dr. Verlee RossettiElsner's office. She states that his office has not received them yet and they were done in October. Please advise  Dr. Danielle DessElsner phone: [phone] Fax: [phone]

## 2016-07-30 NOTE — Telephone Encounter (Signed)
Appointment scheduled for 08/03/16 at 11:00am

## 2016-08-03 ENCOUNTER — Encounter: Payer: Self-pay | Admitting: Family Medicine

## 2016-08-03 ENCOUNTER — Ambulatory Visit (INDEPENDENT_AMBULATORY_CARE_PROVIDER_SITE_OTHER): Payer: BLUE CROSS/BLUE SHIELD | Admitting: Family Medicine

## 2016-08-03 VITALS — BP 132/82 | HR 81 | Temp 98.1°F | Ht 65.0 in | Wt 152.8 lb

## 2016-08-03 DIAGNOSIS — F410 Panic disorder [episodic paroxysmal anxiety] without agoraphobia: Secondary | ICD-10-CM | POA: Diagnosis not present

## 2016-08-03 MED ORDER — HYDROXYZINE HCL 25 MG PO TABS: 25.0000 mg | ORAL_TABLET | Freq: Three times a day (TID) | ORAL | 1 refills | Status: DC | PRN

## 2016-08-03 NOTE — Progress Notes (Signed)
Chief Complaint  Patient presents with  . Panic Attack    Pt reports depressed anxious jittery hard to focus afraid x2 months    Subjective [name] presents for f/u anxiety/depression.  Diagnosed- 2-3 years ago. Started a little before Thanksgiving. Has tried Xanax, Valium + Celexa.  She has failed BuSpar. Of note, she is on chronic narcotic therapy for pain. Therapist - She has been doing CBT. No thoughts of harming self or others. No self-medication with alcohol, prescription drugs or illicit drugs.  ROS Psych: No homicidal or suicidal thoughts  Past Medical History:  Diagnosis Date  . Back injury    Chronic  . Cardiomyopathy (HCC)    hypertension controlled-helped with edema-Dr Nahser  . Chest pain    Normal heart catherization  . Collapsed lung   . Deafness    Left ear  . Degenerative joint disease    with neck surgery  . Depression   . Diastolic dysfunction    with possible mild LVOT gradient  . Family history of adverse reaction to anesthesia    " my daughter has PONV and gets very grumpy"  . Fibromyalgia   . Former cigarette smoker   . Gestational diabetes   . H/O blood clots    due to side effect of medication  . Headache(784.0)    migraines  . Hypertension   . Leg cramps   . Neuropathy (HCC)   . Pain    chronic   . Pneumonia   . Pneumothorax   . PTSD (post-traumatic stress disorder)   . Respiratory disorder    Family History  Problem Relation Age of Onset  . Coronary artery disease Father   . Parkinson's disease Mother   . Cancer Sister     breast  . Diabetes Sister   . Diabetes Brother      Medication List       Accurate as of 08/03/16 12:24 PM. Always use your most recent med list.          atorvastatin 20 MG tablet Commonly known as:  LIPITOR Take 1 tablet (20 mg total) by mouth daily at 6 PM.   BIOFREEZE EX Apply 1 application topically 2 (two) times daily as needed (for pain).   citalopram 20 MG tablet Commonly  known as:  CELEXA Take 1 tablet (20 mg total) by mouth daily.   cyclobenzaprine 10 MG tablet Commonly known as:  FLEXERIL Take 10 mg by mouth 3 (three) times daily as needed for muscle spasms.   hydrochlorothiazide 25 MG tablet Commonly known as:  HYDRODIURIL Take 1 tablet (25 mg total) by mouth daily.   HYDROcodone-acetaminophen 7.5-325 MG tablet Commonly known as:  NORCO Take 1-2 tablets by mouth every 6 (six) hours as needed for moderate pain.   hydrOXYzine 25 MG tablet Commonly known as:  ATARAX/VISTARIL Take 1 tablet (25 mg total) by mouth 3 (three) times daily as needed.   ICY HOT EX Apply 1 patch topically daily as needed (for pain).   KAVA KAVA PO Take 1 each by mouth daily.   loperamide 2 MG tablet Commonly known as:  IMODIUM A-D Take 2 mg by mouth 4 (four) times daily as needed for diarrhea or loose stools.   loratadine 10 MG tablet Commonly known as:  CLARITIN Take 10 mg by mouth daily.   magnesium gluconate 500 MG tablet Commonly known as:  MAGONATE Take 500 mg by mouth daily.   Melatonin 5 MG Caps Take 5-10 mg by  mouth at bedtime as needed (for sleep).   metoprolol tartrate 25 MG tablet Commonly known as:  LOPRESSOR Take 1 tablet (25 mg total) by mouth 2 (two) times daily.   vitamin B-12 500 MCG tablet Commonly known as:  CYANOCOBALAMIN Take 500 mcg by mouth daily.       Exam BP 132/82 (BP Location: Left Arm, Patient Position: Sitting, Cuff Size: Small)   Pulse 81   Temp 98.1 F (36.7 C) (Oral)   Ht 5\' 5"  (1.651 m)   Wt 152 lb 12.8 oz (69.3 kg)   SpO2 94%   BMI 25.43 kg/m  General:  well developed, well nourished, in no apparent distress Neck: no thyromegaly Lungs:  clear to auscultation, breath sounds equal bilaterally, no respiratory distress Cardio:  regular rate and rhythm without murmurs, heart sounds without clicks or rubs Psych: well oriented with normal range of affect and age-appropriate judgement/insight, alert and oriented  x4.  Assessment and Plan  Panic attacks - Plan: hydrOXYzine (ATARAX/VISTARIL) 25 MG tablet  Orders as above. We'll continue Celexa. Add Atarax. Gave resources for psychiatry self-referral. I did inform her that I will not prescribe benzodiazepines with concurrent narcotics without the supervision of a psychiatrist. F/u in 1 mo. if uncontrolled, could increase at dose of Atarax, Celexa, or change to a different SSRI. Could also consider trying an as an SNRI. The patient voiced understanding and agreement to the plan.  Jilda Rocheicholas Paul GreenvilleWendling, DO 08/03/16 12:24 PM

## 2016-08-03 NOTE — Telephone Encounter (Signed)
Labs faxed. TL/CMA

## 2016-08-03 NOTE — Patient Instructions (Signed)
[facility] °[address] °[place], [place] [address] °[phone] ° °[facility] °[address] °[place], [place] [address] °[phone] ° °[facility] °[address] °[place], [place] [address] °[phone] ° °[facility] °[address] °[place], [place] [address] °[phone] ° °

## 2016-08-03 NOTE — Progress Notes (Signed)
Pre visit review using our clinic review tool, if applicable. No additional management support is needed unless otherwise documented below in the visit note. 

## 2016-08-06 ENCOUNTER — Ambulatory Visit: Payer: BLUE CROSS/BLUE SHIELD | Admitting: Family Medicine

## 2016-08-10 ENCOUNTER — Telehealth: Payer: Self-pay | Admitting: Family Medicine

## 2016-08-10 NOTE — Telephone Encounter (Signed)
Please review PCP note from 08/03/16 and advise.

## 2016-08-10 NOTE — Telephone Encounter (Signed)
Relation to ZO:XWRUpt:self Call back number:[phone] [facility]: [id number] [place], [place] - [address] [facility] [address] [phone] (Phone) [phone] (Fax)     Reason for call:  Patient states hydrOXYzine (ATARAX/VISTARIL) 25 MG tablet is not working, patient states anxiety is high and PCP did mention prescribing Xanax please advise

## 2016-08-10 NOTE — Telephone Encounter (Signed)
Pt states anxious and hydroxyzine is not helping. She mentioned pcp mentioned maybe using xanax. I looked at the note and it appears he did not want to write benzodizaepne. See his plan. So could you call patient and advise we need to wait until pcp back on Wednesday. Let Dr. Carmelia RollerWendling decide. I don't want to write benzo if pcp is not on board.

## 2016-08-10 NOTE — Telephone Encounter (Signed)
I have spoken to patient who informed me that she does not see a change in symptoms and wants to try and start Xanax.Pt reports that she has taken Xanax before and had relief of symptoms. Pt states she was taking Rx about 5 years ago, never had a reaction, and that she stopped taking Rx because she felt better. Pt states she has been dealing with a lot of death and the stress from the holidays. Per last office note Dr. Carmelia RollerWendling is not willing to move forward with Benzo so I have scheduled patient to follow up 08/12/16 with Dr. Carmelia RollerWendling. Pt states she can hold off until Wednesday and does not need immediate response.   Routing to Dr. Carmelia RollerWendling and Esperanza RichtersEdward Saguier- PA

## 2016-08-10 NOTE — Telephone Encounter (Signed)
I have spoken to patient who informed me that she does not see a change and wants to try and start Xanax.Pt reports that she has taken Xanax before and had relief of symptoms. Pt states she was taking Rx about 5 years ago, never had a reaction, and that she stopped taking Rx because she felt better. Pt states she has been dealing with a lot of death and the stress from the holidays.

## 2016-08-12 ENCOUNTER — Ambulatory Visit: Payer: BLUE CROSS/BLUE SHIELD | Admitting: Family Medicine

## 2016-08-12 NOTE — Telephone Encounter (Signed)
I have spoken to patient and informed her of Dr. Carmelia RollerWendling Recommendations. Pt should try doubling up on Rx to see if that gives pt relief.I also informed patient Dr. Carmelia RollerWendling does not want to prescribe due to the patient being on narcotics. Pt appt to cancel appointment. TL/CMA

## 2016-08-12 NOTE — Telephone Encounter (Signed)
Patient called back to follow up on request for change in medication. Plse adv.

## 2016-08-19 ENCOUNTER — Telehealth: Payer: Self-pay | Admitting: Family Medicine

## 2016-08-19 ENCOUNTER — Telehealth: Payer: Self-pay | Admitting: Cardiovascular Disease

## 2016-08-19 DIAGNOSIS — F410 Panic disorder [episodic paroxysmal anxiety] without agoraphobia: Secondary | ICD-10-CM

## 2016-08-19 MED ORDER — HYDROXYZINE HCL 25 MG PO TABS: 25.0000 mg | ORAL_TABLET | Freq: Three times a day (TID) | ORAL | 2 refills | Status: DC | PRN

## 2016-08-19 MED ORDER — CITALOPRAM HYDROBROMIDE 20 MG PO TABS: 20.0000 mg | ORAL_TABLET | Freq: Every day | ORAL | 2 refills | Status: DC

## 2016-08-19 NOTE — Telephone Encounter (Signed)
Caller name: Relationship to patient: Self Can be reached: [phone]  Pharmacy:  [facility] [phone] [place] - [phone] [facility] [address] [phone] (Phone) [phone] (Fax)     Reason for call: Refill hydrOXYzine (ATARAX/VISTARIL) 25 MG tablet (need the 50 mg) citalopram (CELEXA) 20 MG tablet

## 2016-08-19 NOTE — Telephone Encounter (Signed)
I have sent in Rx refill for Citalopram and hydroxyzine with #30 tablets and #2 refills. TL/CMA

## 2016-08-19 NOTE — Telephone Encounter (Signed)
New message      Calling to see if pt is due to have any labs drawn.  If yes, she want to get it done before the end of the year.  Please call

## 2016-08-19 NOTE — Telephone Encounter (Signed)
I have called pt and LVMOM for return call. TL/CMA

## 2016-08-19 NOTE — Telephone Encounter (Signed)
Spoke with patient and advised her that no additional lab work is needed prior to the end of the year.  I advised that lab work may be needed in April at her yearly follow-up. She verbalized understanding and states that will not be a problem. She thanked me for the call.

## 2016-08-31 ENCOUNTER — Ambulatory Visit (INDEPENDENT_AMBULATORY_CARE_PROVIDER_SITE_OTHER): Payer: BLUE CROSS/BLUE SHIELD | Admitting: Family Medicine

## 2016-08-31 ENCOUNTER — Encounter: Payer: Self-pay | Admitting: Family Medicine

## 2016-08-31 VITALS — BP 126/80 | HR 77 | Temp 97.6°F | Wt 159.2 lb

## 2016-08-31 DIAGNOSIS — F410 Panic disorder [episodic paroxysmal anxiety] without agoraphobia: Secondary | ICD-10-CM | POA: Diagnosis not present

## 2016-08-31 DIAGNOSIS — J209 Acute bronchitis, unspecified: Secondary | ICD-10-CM | POA: Diagnosis not present

## 2016-08-31 MED ORDER — HYDROXYZINE HCL 25 MG PO TABS: ORAL_TABLET | ORAL | 2 refills | Status: DC

## 2016-08-31 MED ORDER — BUSPIRONE HCL 7.5 MG PO TABS: 7.5000 mg | ORAL_TABLET | Freq: Three times a day (TID) | ORAL | 1 refills | Status: DC

## 2016-08-31 MED ORDER — BENZONATATE 100 MG PO CAPS: 100.0000 mg | ORAL_CAPSULE | Freq: Three times a day (TID) | ORAL | 0 refills | Status: DC | PRN

## 2016-08-31 NOTE — Patient Instructions (Signed)
Contact the psychiatry offices listed in your previous AVS.  Claritin (loratadine), Allegra (fexofenadine), Zyrtec (cetirizine); these are listed in order from weakest to strongest. Generic, and therefore cheaper, options are in the parentheses.   Flonase (fluticasone); nasal spray that is over the counter. 2 sprays each nostril, once daily. Aim towards the same side eye when you spray.  There are available OTC, and the generic versions, which may be cheaper, are in parentheses. Show this to a pharmacist if you have trouble finding any of these items.

## 2016-08-31 NOTE — Progress Notes (Signed)
Pre visit review using our clinic review tool, if applicable. No additional management support is needed unless otherwise documented below in the visit note. 

## 2016-08-31 NOTE — Progress Notes (Signed)
Chief Complaint  Patient presents with  . Follow-up    Still having anxiety concerns.    Subjective: Patient is a 56 y.o. female here for anxiety f/u.  On Celexa, recently started on Atarax prn for anxiety. She was requesting Xanax, but she was already on chronic Norco for neck pain. She has been having more anxiety as of late. The Atarax did not do much for her. She is only on Celexa 20 mg daily otherwise. No thoughts of harming self or others. She is not self medicating. She does follow with a therapist.  She has also been having a runny and stuffy nose for the past several days. It is starting to turn into a nonproductive cough. No fevers, sick contacts, ear pain, facial pain.  ROS: HEENT: As noted in HPI Psych: As noted in HPI  Family History  Problem Relation Age of Onset  . Coronary artery disease Father   . Parkinson's disease Mother   . Cancer Sister     breast  . Diabetes Sister   . Diabetes Brother    Past Medical History:  Diagnosis Date  . Back injury    Chronic  . Cardiomyopathy (HCC)    hypertension controlled-helped with edema-Dr Nahser  . Chest pain    Normal heart catherization  . Collapsed lung   . Deafness    Left ear  . Degenerative joint disease    with neck surgery  . Depression   . Diastolic dysfunction    with possible mild LVOT gradient  . Family history of adverse reaction to anesthesia    " my daughter has PONV and gets very grumpy"  . Fibromyalgia   . Former cigarette smoker   . Gestational diabetes   . H/O blood clots    due to side effect of medication  . Headache(784.0)    migraines  . Hypertension   . Leg cramps   . Neuropathy (HCC)   . Pain    chronic   . Pneumonia   . Pneumothorax   . PTSD (post-traumatic stress disorder)   . Respiratory disorder    Allergies  Allergen Reactions  . Endocet [Oxycodone-Acetaminophen] Other (See Comments)    Rash,did not help with pain Rash,did not help with pain  . Neurontin  [Gabapentin] Other (See Comments)    Sleepy all the time    Current Outpatient Prescriptions:  .  atorvastatin (LIPITOR) 20 MG tablet, Take 1 tablet (20 mg total) by mouth daily at 6 PM., Disp: 30 tablet, Rfl: 11 .  benzonatate (TESSALON) 100 MG capsule, Take 1 capsule (100 mg total) by mouth 3 (three) times daily as needed., Disp: 20 capsule, Rfl: 0 .  busPIRone (BUSPAR) 7.5 MG tablet, Take 1 tablet (7.5 mg total) by mouth 3 (three) times daily., Disp: 90 tablet, Rfl: 1 .  citalopram (CELEXA) 20 MG tablet, Take 1 tablet (20 mg total) by mouth daily., Disp: 30 tablet, Rfl: 2 .  cyclobenzaprine (FLEXERIL) 10 MG tablet, Take 10 mg by mouth 3 (three) times daily as needed for muscle spasms., Disp: , Rfl:  .  hydrochlorothiazide (HYDRODIURIL) 25 MG tablet, Take 1 tablet (25 mg total) by mouth daily., Disp: 90 tablet, Rfl: 3 .  HYDROcodone-acetaminophen (NORCO) 7.5-325 MG tablet, Take 1-2 tablets by mouth every 6 (six) hours as needed for moderate pain. , Disp: , Rfl: 0 .  hydrOXYzine (ATARAX/VISTARIL) 25 MG tablet, Take 3-4 every 8 hours as needed for anxiety., Disp: 90 tablet, Rfl: 2 .  Kava,  Piper methysticum, (KAVA KAVA PO), Take 1 each by mouth daily., Disp: , Rfl:  .  loperamide (IMODIUM A-D) 2 MG tablet, Take 2 mg by mouth 4 (four) times daily as needed for diarrhea or loose stools., Disp: , Rfl:  .  loratadine (CLARITIN) 10 MG tablet, Take 10 mg by mouth daily., Disp: , Rfl:  .  magnesium gluconate (MAGONATE) 500 MG tablet, Take 500 mg by mouth daily., Disp: , Rfl:  .  Melatonin 5 MG CAPS, Take 5-10 mg by mouth at bedtime as needed (for sleep)., Disp: , Rfl:  .  Menthol, Topical Analgesic, (BIOFREEZE EX), Apply 1 application topically 2 (two) times daily as needed (for pain)., Disp: , Rfl:  .  Menthol, Topical Analgesic, (ICY HOT EX), Apply 1 patch topically daily as needed (for pain). , Disp: , Rfl:  .  metoprolol tartrate (LOPRESSOR) 25 MG tablet, Take 1 tablet (25 mg total) by mouth 2 (two)  times daily., Disp: 60 tablet, Rfl: 11 .  vitamin B-12 (CYANOCOBALAMIN) 500 MCG tablet, Take 500 mcg by mouth daily., Disp: , Rfl:   Objective: BP 126/80 (BP Location: Left Arm, Patient Position: Sitting, Cuff Size: Normal)   Pulse 77   Temp 97.6 F (36.4 C) (Oral)   Wt 159 lb 3.2 oz (72.2 kg)   SpO2 97% Comment: RA  BMI 26.49 kg/m  General: Awake, appears stated age HEENT: MMM, EOMi, Ears patent b/l, TM's mildly retracted, neg otherwise Heart: RRR, no murmurs Lungs: CTAB, no rales, wheezes or rhonchi. No accessory muscle use Psych: Age appropriate judgment and insight, normal affect and mood  Assessment and Plan: Panic attacks - Plan: busPIRone (BUSPAR) 7.5 MG tablet, hydrOXYzine (ATARAX/VISTARIL) 25 MG tablet  Acute bronchitis, unspecified organism - Plan: benzonatate (TESSALON) 100 MG capsule   Orders as above. Encouraged her to establish with psychiatry. She still has the contact information from her previous visit. She agreed to do this. Will add Buspar. Offered to change Celexa, but she declined. If not controlled at next visit, could try to increase Buspar vs add propranolol. Also could add Valium vs Klonopin. I would be more hesitant to do this given her concurrent Norco use though. Recommended consistent antihistamine use in addition to INCS.  F/u in 5 weeks. The patient voiced understanding and agreement to the plan.  Jilda Roche [place], DO 08/31/16  4:37 PM

## 2016-10-03 ENCOUNTER — Other Ambulatory Visit: Payer: Self-pay | Admitting: Family Medicine

## 2016-10-03 DIAGNOSIS — F410 Panic disorder [episodic paroxysmal anxiety] without agoraphobia: Secondary | ICD-10-CM

## 2016-10-05 NOTE — Telephone Encounter (Signed)
Please advise. TL/CMA 

## 2016-10-13 ENCOUNTER — Other Ambulatory Visit: Payer: Self-pay | Admitting: Family Medicine

## 2016-10-13 DIAGNOSIS — F410 Panic disorder [episodic paroxysmal anxiety] without agoraphobia: Secondary | ICD-10-CM

## 2016-10-22 ENCOUNTER — Other Ambulatory Visit: Payer: Self-pay | Admitting: Family Medicine

## 2016-10-22 DIAGNOSIS — F410 Panic disorder [episodic paroxysmal anxiety] without agoraphobia: Secondary | ICD-10-CM

## 2016-10-26 ENCOUNTER — Other Ambulatory Visit: Payer: Self-pay

## 2016-10-26 DIAGNOSIS — F410 Panic disorder [episodic paroxysmal anxiety] without agoraphobia: Secondary | ICD-10-CM

## 2016-10-26 MED ORDER — HYDROXYZINE HCL 25 MG PO TABS: ORAL_TABLET | ORAL | 3 refills | Status: DC

## 2016-11-02 NOTE — Telephone Encounter (Signed)
Rx was filled on (10/26/16) #90,3.//AB/CMA

## 2016-11-30 ENCOUNTER — Other Ambulatory Visit: Payer: Self-pay | Admitting: Family Medicine

## 2016-11-30 DIAGNOSIS — F410 Panic disorder [episodic paroxysmal anxiety] without agoraphobia: Secondary | ICD-10-CM

## 2016-11-30 NOTE — Telephone Encounter (Signed)
Requesting Hydroxyzine -Take 3 to 4 tablets by mouth every 8 hours as needed for anxiety. Last refill:10/26/16:#90,3 Last OV:08/31/16-Asked to RTC in 5 weeks. Please advise.//AB/CMA

## 2016-12-06 ENCOUNTER — Other Ambulatory Visit: Payer: Self-pay | Admitting: Family Medicine

## 2016-12-07 NOTE — Telephone Encounter (Signed)
Requesting Celexa -Take 1 tablet by mouth daily. Last refill:12./27/17;#30,2 Last OV:08/31/16-Asked to RTC in (5 weeks) PLease advise//AB/CMA

## 2016-12-08 NOTE — Telephone Encounter (Signed)
90 days given, schedule appt in mid-late June for med check please. TY.

## 2016-12-08 NOTE — Telephone Encounter (Signed)
Patient informed refill done/appt scheduled for 12/21/16

## 2016-12-14 NOTE — Telephone Encounter (Signed)
Patient request a call when Rx called in @ [phone].

## 2016-12-15 MED ORDER — HYDROCHLOROTHIAZIDE 25 MG PO TABS: 25.0000 mg | ORAL_TABLET | Freq: Every day | ORAL | 1 refills | Status: DC

## 2016-12-15 NOTE — Telephone Encounter (Addendum)
Relation to ZO:XWRU Call back number:[phone] Pharmacy: [id number] - [place], [place] - [address] [facility] [address]  Reason for call:  Patient checking on the status of medication request hydrochlorothiazide (HYDRODIURIL) 25 MG tablet

## 2016-12-15 NOTE — Telephone Encounter (Signed)
Hydrochlorothiazide refilled. TY.

## 2016-12-16 ENCOUNTER — Other Ambulatory Visit: Payer: Self-pay | Admitting: Family Medicine

## 2016-12-16 DIAGNOSIS — F410 Panic disorder [episodic paroxysmal anxiety] without agoraphobia: Secondary | ICD-10-CM

## 2016-12-16 MED ORDER — HYDROCHLOROTHIAZIDE 25 MG PO TABS: 25.0000 mg | ORAL_TABLET | Freq: Every day | ORAL | 1 refills | Status: DC

## 2016-12-16 NOTE — Telephone Encounter (Signed)
Resent to [facility] in [place]

## 2016-12-16 NOTE — Addendum Note (Signed)
Addended by: Scharlene Gloss B on: 12/16/2016 03:55 PM   Modules accepted: Orders

## 2016-12-16 NOTE — Telephone Encounter (Signed)
Pt called back in. Pt says that she spoke with pharmacy and they stated that Rx was never received. Verified pharmacy info. Pt says that it is correct. Please assist pt further.

## 2016-12-17 ENCOUNTER — Telehealth: Payer: Self-pay | Admitting: Family Medicine

## 2016-12-17 DIAGNOSIS — F410 Panic disorder [episodic paroxysmal anxiety] without agoraphobia: Secondary | ICD-10-CM

## 2016-12-17 MED ORDER — HYDROXYZINE HCL 25 MG PO TABS: ORAL_TABLET | ORAL | 0 refills | Status: DC

## 2016-12-17 MED ORDER — HYDROXYZINE HCL 25 MG PO TABS: ORAL_TABLET | ORAL | 3 refills | Status: DC

## 2016-12-17 NOTE — Telephone Encounter (Signed)
Requesting Hydroxyzine -Take 3 to 4 tablets by mouth every 8 hours as needed for anxiety. Last refill:12/01/16;#90,0 Last OV:08/31/16-RTC in 5 weeks-Pt has an appt for (12/21/16). Please advise.//AB/CMA

## 2016-12-17 NOTE — Addendum Note (Signed)
Addended by: Verdie Shire on: 12/17/2016 03:33 PM   Modules accepted: Orders

## 2016-12-17 NOTE — Telephone Encounter (Signed)
Rx sent to the pharmacy by e-script.//AB/CMA 

## 2016-12-17 NOTE — Telephone Encounter (Signed)
°  Relation to WU:JWJX Call back [phone] Pharmacy:[id number] - [place], [place] - [address] AT [facility] [address]  Reason for call:  Patient requesting a refill hydrOXYzine (ATARAX/VISTARIL) 25 MG tablet

## 2016-12-17 NOTE — Telephone Encounter (Signed)
Rx has already been filled and sent to the pharmacy.//AB/CMA

## 2016-12-17 NOTE — Telephone Encounter (Signed)
Sent in

## 2016-12-21 ENCOUNTER — Ambulatory Visit (INDEPENDENT_AMBULATORY_CARE_PROVIDER_SITE_OTHER): Payer: BLUE CROSS/BLUE SHIELD | Admitting: Family Medicine

## 2016-12-21 ENCOUNTER — Encounter: Payer: Self-pay | Admitting: Family Medicine

## 2016-12-21 VITALS — BP 110/70 | HR 81 | Temp 98.2°F | Ht 65.0 in | Wt 156.6 lb

## 2016-12-21 DIAGNOSIS — M79604 Pain in right leg: Secondary | ICD-10-CM

## 2016-12-21 DIAGNOSIS — M79605 Pain in left leg: Secondary | ICD-10-CM | POA: Diagnosis not present

## 2016-12-21 DIAGNOSIS — R21 Rash and other nonspecific skin eruption: Secondary | ICD-10-CM

## 2016-12-21 DIAGNOSIS — I10 Essential (primary) hypertension: Secondary | ICD-10-CM

## 2016-12-21 MED ORDER — PREDNISONE 50 MG PO TABS: ORAL_TABLET | ORAL | 0 refills | Status: DC

## 2016-12-21 MED ORDER — HYDROCODONE-ACETAMINOPHEN 5-325 MG PO TABS: 1.0000 | ORAL_TABLET | Freq: Four times a day (QID) | ORAL | 0 refills | Status: DC | PRN

## 2016-12-21 NOTE — Patient Instructions (Addendum)
If you start doing better on medicine, cancel appt with derm.  Let us know if you need anything in the meanwhile.  Wear sunscreen anytime you go outdoors.

## 2016-12-21 NOTE — Progress Notes (Signed)
Chief Complaint  Patient presents with  . Follow-up    6 mos on BP  . Rash    on (B) legs-no itching but burning-x 2-3 weeks    Subjective [name] is a 56 y.o. female who presents for hypertension follow up. She does not monitor home blood pressures. She is compliant with medications- HCTZ 25 mg. Patient has these side effects of medication: none She is adhering to a healthy diet overall. Current exercise: Some walking, chasing grandson, active in garden  Rash Patient has been experiencing a rash that started 3 weeks ago after being at the beach. It is reddish in color and described as burning. She feels like both her legs are swollen as well. She denies any new topicals, soaps, laundry detergents, or lotions. She has not tried any topicals or treatments. No sick contacts. It does not itch.   Past Medical History:  Diagnosis Date  . Back injury    Chronic  . Cardiomyopathy (HCC)    hypertension controlled-helped with edema-Dr Nahser  . Chest pain    Normal heart catherization  . Collapsed lung   . Deafness    Left ear  . Degenerative joint disease    with neck surgery  . Depression   . Diastolic dysfunction    with possible mild LVOT gradient  . Family history of adverse reaction to anesthesia    " my daughter has PONV and gets very grumpy"  . Fibromyalgia   . Former cigarette smoker   . Gestational diabetes   . H/O blood clots    due to side effect of medication  . Headache(784.0)    migraines  . Hypertension   . Leg cramps   . Neuropathy   . Pain    chronic   . Pneumonia   . Pneumothorax   . PTSD (post-traumatic stress disorder)   . Respiratory disorder    Family History  Problem Relation Age of Onset  . Coronary artery disease Father   . Parkinson's disease Mother   . Cancer Sister     breast  . Diabetes Sister   . Diabetes Brother      Medications Current Outpatient Prescriptions on File Prior to Visit  Medication Sig Dispense Refill   . atorvastatin (LIPITOR) 20 MG tablet Take 1 tablet (20 mg total) by mouth daily at 6 PM. 30 tablet 11  . benzonatate (TESSALON) 100 MG capsule Take 1 capsule (100 mg total) by mouth 3 (three) times daily as needed. 20 capsule 0  . citalopram (CELEXA) 20 MG tablet TAKE 1 TABLET(20 MG) BY MOUTH DAILY 90 tablet 0  . cyclobenzaprine (FLEXERIL) 10 MG tablet Take 10 mg by mouth 3 (three) times daily as needed for muscle spasms.    . hydrochlorothiazide (HYDRODIURIL) 25 MG tablet Take 1 tablet (25 mg total) by mouth daily. 90 tablet 1  . hydrOXYzine (ATARAX/VISTARIL) 25 MG tablet TAKE 3 TO 4 TABLETS BY MOUTH EVERY 8 HOURS AS NEEDED FOR ANXIETY 90 tablet 0  . Kava, Piper methysticum, (KAVA KAVA PO) Take 1 each by mouth daily.    Marland Kitchen loperamide (IMODIUM A-D) 2 MG tablet Take 2 mg by mouth 4 (four) times daily as needed for diarrhea or loose stools.    Marland Kitchen loratadine (CLARITIN) 10 MG tablet Take 10 mg by mouth daily.    . magnesium gluconate (MAGONATE) 500 MG tablet Take 500 mg by mouth daily.    . Melatonin 5 MG CAPS Take 5-10 mg by mouth  at bedtime as needed (for sleep).    . Menthol, Topical Analgesic, (BIOFREEZE EX) Apply 1 application topically 2 (two) times daily as needed (for pain).    . Menthol, Topical Analgesic, (ICY HOT EX) Apply 1 patch topically daily as needed (for pain).     . metoprolol tartrate (LOPRESSOR) 25 MG tablet Take 1 tablet (25 mg total) by mouth 2 (two) times daily. 60 tablet 11  . vitamin B-12 (CYANOCOBALAMIN) 500 MCG tablet Take 500 mcg by mouth daily.    Marland Kitchen HYDROcodone-acetaminophen (NORCO) 7.5-325 MG tablet Take 1-2 tablets by mouth every 6 (six) hours as needed for moderate pain.   0   Allergies Allergies  Allergen Reactions  . Neurontin [Gabapentin] Other (See Comments)    Sleepy all the time    Review of Systems Cardiovascular: no chest pain Respiratory:  no shortness of breath  Exam BP 110/70 (BP Location: Left Arm, Patient Position: Sitting, Cuff Size: Normal)    Pulse 81   Temp 98.2 F (36.8 C) (Oral)   Ht  (1.651 m)   Wt 156 lb 9.6 oz (71 kg)   SpO2 97%   BMI 26.06 kg/m  General:  well developed, well nourished, in no apparent distress Skin:  warm, no streaking redness or weeping lesions, no fluctuance or drainage, on anterior LE's b/l, there is an erythematous area with pinpoint and confluent macules suggestive of a vasculitis. It is TTP. Eyes:  pupils equal and round, sclera anicteric without injection Heart :RRR, no murmurs, no bruits, I do not appreciate any LE edema Lungs:  clear to auscultation, no accessory muscle use Psych: well oriented with normal range of affect and appropriate judgment/insight  Rash - Plan: Ambulatory referral to Dermatology  Pain in both lower extremities - Plan: predniSONE (DELTASONE) 50 MG tablet, HYDROcodone-acetaminophen (NORCO/VICODIN) 5-325 MG tablet  Refer to derm, cancel if doing better. 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 Prednisone for this. Norco should this not be helpful. She no longer receives chronic narcotics from surgeon. Cont BP regimen Counseled on diet and exercise F/u in 6 mo. The patient voiced understanding and agreement to the plan.  Jilda Roche [place], DO 12/21/16  4:51 PM

## 2016-12-21 NOTE — Progress Notes (Signed)
Pre visit review using our clinic review tool, if applicable. No additional management support is needed unless otherwise documented below in the visit note. 

## 2016-12-24 ENCOUNTER — Telehealth: Payer: Self-pay | Admitting: Family Medicine

## 2016-12-24 NOTE — Telephone Encounter (Signed)
Caller name: Relationship to patient: Self Can be reached: [phone]  Pharmacy:  [facility] [address] - [place], [place] - [address] [phone] (Phone) [phone])     Reason for call: Patient is going to Dermatologist tomorrow and wants to know if she needs to follow up with an appointment with Dr. Carmelia RollerWendling tomorrow. Also, request Rx for more pain medication.

## 2016-12-24 NOTE — Telephone Encounter (Signed)
Please advise.//AB/CMA 

## 2016-12-25 ENCOUNTER — Telehealth: Payer: Self-pay | Admitting: Family Medicine

## 2016-12-25 DIAGNOSIS — L958 Other vasculitis limited to the skin: Secondary | ICD-10-CM | POA: Diagnosis not present

## 2016-12-25 DIAGNOSIS — L818 Other specified disorders of pigmentation: Secondary | ICD-10-CM | POA: Diagnosis not present

## 2016-12-25 DIAGNOSIS — L817 Pigmented purpuric dermatosis: Secondary | ICD-10-CM | POA: Diagnosis not present

## 2016-12-25 NOTE — Telephone Encounter (Signed)
Patient scheduled for Saturday Clinic 12/26/16 with PCP.

## 2016-12-25 NOTE — Telephone Encounter (Signed)
Pt came for script. Pt has one dose left hydrocodone. Pt needs script. Pt ph [phone]. Pt uses [facility] [place].

## 2016-12-25 NOTE — Telephone Encounter (Signed)
Relation to ZO:XWRUpt:self Call back number:[phone]   Reason for call:  Patient experiencing lower back pain and pain in both legs due to rash, patient will follow PCP instructions and keep 11:30am dermatology appointment. Patient unsure if dermatology will prescribe a pain medication, requesting Rx. Informed patient nurse will follow up and patient will ensure specialist sends report to Dr. Carmelia RollerWendling,

## 2016-12-25 NOTE — Telephone Encounter (Signed)
OK to reorder short supply until getting in with derm. She does not need to see me if she can be seen by them. TY.

## 2016-12-25 NOTE — Telephone Encounter (Signed)
They will not rx a pain medicine for low back pain. The pain medicine I was rx'ing was for the rash. If she wants something for the back, I would like to see her. TY.

## 2016-12-26 ENCOUNTER — Ambulatory Visit (INDEPENDENT_AMBULATORY_CARE_PROVIDER_SITE_OTHER): Payer: BLUE CROSS/BLUE SHIELD | Admitting: Family Medicine

## 2016-12-26 VITALS — BP 120/80 | HR 67 | Temp 98.5°F | Wt 158.8 lb

## 2016-12-26 DIAGNOSIS — M542 Cervicalgia: Secondary | ICD-10-CM

## 2016-12-26 DIAGNOSIS — M792 Neuralgia and neuritis, unspecified: Secondary | ICD-10-CM | POA: Diagnosis not present

## 2016-12-26 DIAGNOSIS — G8929 Other chronic pain: Secondary | ICD-10-CM | POA: Diagnosis not present

## 2016-12-26 DIAGNOSIS — M545 Low back pain, unspecified: Secondary | ICD-10-CM

## 2016-12-26 MED ORDER — HYDROCODONE-ACETAMINOPHEN 5-325 MG PO TABS: ORAL_TABLET | ORAL | 0 refills | Status: DC

## 2016-12-26 MED ORDER — NAPROXEN 500 MG PO TABS: 500.0000 mg | ORAL_TABLET | Freq: Two times a day (BID) | ORAL | 0 refills | Status: DC

## 2016-12-26 MED ORDER — TRAMADOL HCL 50 MG PO TABS: 50.0000 mg | ORAL_TABLET | Freq: Three times a day (TID) | ORAL | 0 refills | Status: DC | PRN

## 2016-12-26 NOTE — Patient Instructions (Addendum)
EXERCISES RANGE OF MOTION (ROM) AND STRETCHING EXERCISES  These exercises may help you when beginning to rehabilitate your issue. In order to successfully resolve your symptoms, you must improve your posture. These exercises are designed to help reduce the forward-head and rounded-shoulder posture which contributes to this condition. Your symptoms may resolve with or without further involvement from your physician, physical therapist or athletic trainer. While completing these exercises, remember:   Restoring tissue flexibility helps normal motion to return to the joints. This allows healthier, less painful movement and activity.  An effective stretch should be held for at least 20 seconds, although you may need to begin with shorter hold times for comfort.  A stretch should never be painful. You should only feel a gentle lengthening or release in the stretched tissue.  Do not do any stretch or exercise that you cannot tolerate.  STRETCH- Axial Extensors  Lie on your back on the floor. You may bend your knees for comfort. Place a rolled-up hand towel or dish towel, about 2 inches in diameter, under the part of your head that makes contact with the floor.  Gently tuck your chin, as if trying to make a "double chin," until you feel a gentle stretch at the base of your head.  Hold 15-20 seconds. Repeat 2-3 times. Complete this exercise 1 time per day.   STRETCH - Axial Extension   Stand or sit on a firm surface. Assume a good posture: chest up, shoulders drawn back, abdominal muscles slightly tense, knees unlocked (if standing) and feet hip width apart.  Slowly retract your chin so your head slides back and your chin slightly lowers. Continue to look straight ahead.  You should feel a gentle stretch in the back of your head. Be certain not to feel an aggressive stretch since this can cause headaches later.  Hold for 15-20 seconds. Repeat 2-3 times. Complete this exercise 1 time per  day.  STRETCH - Cervical Side Bend   Stand or sit on a firm surface. Assume a good posture: chest up, shoulders drawn back, abdominal muscles slightly tense, knees unlocked (if standing) and feet hip width apart.  Without letting your nose or shoulders move, slowly tip your right / left ear to your shoulder until your feel a gentle stretch in the muscles on the opposite side of your neck.  Hold 15-20 seconds. Repeat 2-3 times. Complete this exercise 1-2 times per day.  STRETCH - Cervical Rotators   Stand or sit on a firm surface. Assume a good posture: chest up, shoulders drawn back, abdominal muscles slightly tense, knees unlocked (if standing) and feet hip width apart.  Keeping your eyes level with the ground, slowly turn your head until you feel a gentle stretch along the back and opposite side of your neck.  Hold 15-20 seconds. Repeat 2-3 times. Complete this exercise 1-2 times per day.  RANGE OF MOTION - Neck Circles   Stand or sit on a firm surface. Assume a good posture: chest up, shoulders drawn back, abdominal muscles slightly tense, knees unlocked (if standing) and feet hip width apart.  Gently roll your head down and around from the back of one shoulder to the back of the other. The motion should never be forced or painful.  Repeat the motion 10-20 times, or until you feel the neck muscles relax and loosen. Repeat 2-3 times. Complete the exercise 1-2 times per day. STRENGTHENING EXERCISES - Cervical Strain and Sprain These exercises may help you when beginning to  rehabilitate your injury. They may resolve your symptoms with or without further involvement from your physician, physical therapist, or athletic trainer. While completing these exercises, remember:   Muscles can gain both the endurance and the strength needed for everyday activities through controlled exercises.  Complete these exercises as instructed by your physician, physical therapist, or athletic trainer.  Progress the resistance and repetitions only as guided.  You may experience muscle soreness or fatigue, but the pain or discomfort you are trying to eliminate should never worsen during these exercises. If this pain does worsen, stop and make certain you are following the directions exactly. If the pain is still present after adjustments, discontinue the exercise until you can discuss the trouble with your clinician.  STRENGTH - Cervical Flexors, Isometric  Face a wall, standing about 6 inches away. Place a small pillow, a ball about 6-8 inches in diameter, or a folded towel between your forehead and the wall.  Slightly tuck your chin and gently push your forehead into the soft object. Push only with mild to moderate intensity, building up tension gradually. Keep your jaw and forehead relaxed.  Hold 10 to 20 seconds. Keep your breathing relaxed.  Release the tension slowly. Relax your neck muscles completely before you start the next repetition. Repeat 2-3 times. Complete this exercise 1 time per day.  STRENGTH- Cervical Lateral Flexors, Isometric   Stand about 6 inches away from a wall. Place a small pillow, a ball about 6-8 inches in diameter, or a folded towel between the side of your head and the wall.  Slightly tuck your chin and gently tilt your head into the soft object. Push only with mild to moderate intensity, building up tension gradually. Keep your jaw and forehead relaxed.  Hold 10 to 20 seconds. Keep your breathing relaxed.  Release the tension slowly. Relax your neck muscles completely before you start the next repetition. Repeat 2-3 times. Complete this exercise 1 time per day.  STRENGTH - Cervical Extensors, Isometric   Stand about 6 inches away from a wall. Place a small pillow, a ball about 6-8 inches in diameter, or a folded towel between the back of your head and the wall.  Slightly tuck your chin and gently tilt your head back into the soft object. Push only with  mild to moderate intensity, building up tension gradually. Keep your jaw and forehead relaxed.  Hold 10 to 20 seconds. Keep your breathing relaxed.  Release the tension slowly. Relax your neck muscles completely before you start the next repetition. Repeat 2-3 times. Complete this exercise 1 time per day.  POSTURE AND BODY MECHANICS CONSIDERATIONS Keeping correct posture when sitting, standing or completing your activities will reduce the stress put on different body tissues, allowing injured tissues a chance to heal and limiting painful experiences. The following are general guidelines for improved posture. Your physician or physical therapist will provide you with any instructions specific to your needs. While reading these guidelines, remember:  The exercises prescribed by your provider will help you have the flexibility and strength to maintain correct postures.  The correct posture provides the optimal environment for your joints to work. All of your joints have less wear and tear when properly supported by a spine with good posture. This means you will experience a healthier, less painful body.  Correct posture must be practiced with all of your activities, especially prolonged sitting and standing. Correct posture is as important when doing repetitive low-stress activities (typing) as it is when  doing a single heavy-load activity (lifting).  PROLONGED STANDING WHILE SLIGHTLY LEANING FORWARD When completing a task that requires you to lean forward while standing in one place for a long time, place either foot up on a stationary 2- to 4-inch high object to help maintain the best posture. When both feet are on the ground, the low back tends to lose its slight inward curve. If this curve flattens (or becomes too large), then the back and your other joints will experience too much stress, fatigue more quickly, and can cause pain.   RESTING POSITIONS Consider which positions are most painful for  you when choosing a resting position. If you have pain with flexion-based activities (sitting, bending, stooping, squatting), choose a position that allows you to rest in a less flexed posture. You would want to avoid curling into a fetal position on your side. If your pain worsens with extension-based activities (prolonged standing, working overhead), avoid resting in an extended position such as sleeping on your stomach. Most people will find more comfort when they rest with their spine in a more neutral position, neither too rounded nor too arched. Lying on a non-sagging bed on your side with a pillow between your knees, or on your back with a pillow under your knees will often provide some relief. Keep in mind, being in any one position for a prolonged period of time, no matter how correct your posture, can still lead to stiffness.  WALKING Walk with an upright posture. Your ears, shoulders, and hips should all line up. OFFICE WORK When working at a desk, create an environment that supports good, upright posture. Without extra support, muscles fatigue and lead to excessive strain on joints and other tissues.  CHAIR:  A chair should be able to slide under your desk when your back makes contact with the back of the chair. This allows you to work closely.  The chair's height should allow your eyes to be level with the upper part of your monitor and your hands to be slightly lower than your elbows.  Body position: ? Your feet should make contact with the floor. If this is not possible, use a foot rest. ? Keep your ears over your shoulders. This will reduce stress on your neck and low back.   EXERCISES  RANGE OF MOTION (ROM) AND STRETCHING EXERCISES - Low Back Sprain Most people with lower back pain will find that their symptoms get worse with excessive bending forward (flexion) or arching at the lower back (extension). The exercises that will help resolve your symptoms will focus on the opposite  motion.  Your physician, physical therapist or athletic trainer will help you determine which exercises will be most helpful to resolve your lower back pain. Do not complete any exercises without first consulting with your caregiver. Discontinue any exercises which make your symptoms worse, until you speak to your caregiver. If you have pain, numbness or tingling which travels down into your buttocks, leg or foot, the goal of the therapy is for these symptoms to move closer to your back and eventually resolve. Sometimes, these leg symptoms will get better, but your lower back pain may worsen. This is often an indication of progress in your rehabilitation. Be very alert to any changes in your symptoms and the activities in which you participated in the 24 hours prior to the change. Sharing this information with your caregiver will allow him or her to most efficiently treat your condition. These exercises may help you when  beginning to rehabilitate your injury. Your symptoms may resolve with or without further involvement from your physician, physical therapist or athletic trainer. While completing these exercises, remember:   Restoring tissue flexibility helps normal motion to return to the joints. This allows healthier, less painful movement and activity.  An effective stretch should be held for at least 30 seconds.  A stretch should never be painful. You should only feel a gentle lengthening or release in the stretched tissue. FLEXION RANGE OF MOTION AND STRETCHING EXERCISES:  STRETCH - Flexion, Single Knee to Chest   Lie on a firm bed or floor with both legs extended in front of you.  Keeping one leg in contact with the floor, bring your opposite knee to your chest. Hold your leg in place by either grabbing behind your thigh or at your knee.  Pull until you feel a gentle stretch in your low back. Hold 15-20 seconds.  Slowly release your grasp and repeat the exercise with the opposite  side. Repeat 2 times. Complete this exercise 1-2 times per day.   STRETCH - Flexion, Double Knee to Chest  Lie on a firm bed or floor with both legs extended in front of you.  Keeping one leg in contact with the floor, bring your opposite knee to your chest.  Tense your stomach muscles to support your back and then lift your other knee to your chest. Hold your legs in place by either grabbing behind your thighs or at your knees.  Pull both knees toward your chest until you feel a gentle stretch in your low back. Hold 15-20 seconds.  Tense your stomach muscles and slowly return one leg at a time to the floor. Repeat 2 times. Complete this exercise 1-2 times per day.   STRETCH - Low Trunk Rotation  Lie on a firm bed or floor. Keeping your legs in front of you, bend your knees so they are both pointed toward the ceiling and your feet are flat on the floor.  Extend your arms out to the side. This will stabilize your upper body by keeping your shoulders in contact with the floor.  Gently and slowly drop both knees together to one side until you feel a gentle stretch in your low back. Hold for 15-20 seconds.  Tense your stomach muscles to support your lower back as you bring your knees back to the starting position. Repeat the exercise to the other side. Repeat 2 times. Complete this exercise 1-2 times per day  EXTENSION RANGE OF MOTION AND FLEXIBILITY EXERCISES:  STRETCH - Extension, Prone on Elbows   Lie on your stomach on the floor, a bed will be too soft. Place your palms about shoulder width apart and at the height of your head.  Place your elbows under your shoulders. If this is too painful, stack pillows under your chest.  Allow your body to relax so that your hips drop lower and make contact more completely with the floor.  Hold this position for 15-20 seconds.  Slowly return to lying flat on the floor. Repeat 2 times. Complete this exercise 1-2 times per day.   RANGE OF  MOTION - Extension, Prone Press Ups  Lie on your stomach on the floor, a bed will be too soft. Place your palms about shoulder width apart and at the height of your head.  Keeping your back as relaxed as possible, slowly straighten your elbows while keeping your hips on the floor. You may adjust the placement of your  hands to maximize your comfort. As you gain motion, your hands will come more underneath your shoulders.  Hold this position 15-20 seconds.  Slowly return to lying flat on the floor. Repeat 2 times. Complete this exercise 1-2 times per day.   RANGE OF MOTION- Quadruped, Neutral Spine   Assume a hands and knees position on a firm surface. Keep your hands under your shoulders and your knees under your hips. You may place padding under your knees for comfort.  Drop your head and point your tailbone toward the ground below you. This will round out your lower back like an angry cat. Hold this position for 15-20 seconds.  Slowly lift your head and release your tail bone so that your back sags into a large arch, like an old horse.  Hold this position for 15-20 seconds.  Repeat this until you feel limber in your low back.  Now, find your "sweet spot." This will be the most comfortable position somewhere between the two previous positions. This is your neutral spine. Once you have found this position, tense your stomach muscles to support your low back.  Hold this position for 15-20 seconds. Repeat 2 times. Complete this exercise 1-2 times per day.  STRENGTHENING EXERCISES - Low Back Sprain These exercises may help you when beginning to rehabilitate your injury. These exercises should be done near your "sweet spot." This is the neutral, low-back arch, somewhere between fully rounded and fully arched, that is your least painful position. When performed in this safe range of motion, these exercises can be used for people who have either a flexion or extension based injury. These  exercises may resolve your symptoms with or without further involvement from your physician, physical therapist or athletic trainer. While completing these exercises, remember:   Muscles can gain both the endurance and the strength needed for everyday activities through controlled exercises.  Complete these exercises as instructed by your physician, physical therapist or athletic trainer. Increase the resistance and repetitions only as guided.  You may experience muscle soreness or fatigue, but the pain or discomfort you are trying to eliminate should never worsen during these exercises. If this pain does worsen, stop and make certain you are following the directions exactly. If the pain is still present after adjustments, discontinue the exercise until you can discuss the trouble with your caregiver.  STRENGTHENING - Deep Abdominals, Pelvic Tilt   Lie on a firm bed or floor. Keeping your legs in front of you, bend your knees so they are both pointed toward the ceiling and your feet are flat on the floor.  Tense your lower abdominal muscles to press your low back into the floor. This motion will rotate your pelvis so that your tail bone is scooping upwards rather than pointing at your feet or into the floor. With a gentle tension and even breathing, hold this position for 10-15 seconds. Repeat 2 times. Complete this exercise 1 time per day.   STRENGTHENING - Abdominals, Crunches   Lie on a firm bed or floor. Keeping your legs in front of you, bend your knees so they are both pointed toward the ceiling and your feet are flat on the floor. Cross your arms over your chest.  Slightly tip your chin down without bending your neck.  Tense your abdominals and slowly lift your trunk high enough to just clear your shoulder blades. Lifting higher can put excessive stress on the lower back and does not further strengthen your abdominal muscles.  Control your return to the starting position. Repeat 2  times. Complete this exercise once every 1-2 days.   STRENGTHENING - Quadruped, Opposite UE/LE Lift   Assume a hands and knees position on a firm surface. Keep your hands under your shoulders and your knees under your hips. You may place padding under your knees for comfort.  Find your neutral spine and gently tense your abdominal muscles so that you can maintain this position. Your shoulders and hips should form a rectangle that is parallel with the floor and is not twisted.  Keeping your trunk steady, lift your right hand no higher than your shoulder and then your left leg no higher than your hip. Make sure you are not holding your breath. Hold this position for 15-20 seconds.  Continuing to keep your abdominal muscles tense and your back steady, slowly return to your starting position. Repeat with the opposite arm and leg. Repeat 2 times. Complete this exercise once every 1-2 days.   STRENGTHENING - Abdominals and Quadriceps, Straight Leg Raise   Lie on a firm bed or floor with both legs extended in front of you.  Keeping one leg in contact with the floor, bend the other knee so that your foot can rest flat on the floor.  Find your neutral spine, and tense your abdominal muscles to maintain your spinal position throughout the exercise.  Slowly lift your straight leg off the floor about 6 inches for a count of 15, making sure to not hold your breath.  Still keeping your neutral spine, slowly lower your leg all the way to the floor. Repeat this exercise with each leg 2 times. Complete this exercise once every 1-2 days. POSTURE AND BODY MECHANICS CONSIDERATIONS - Low Back Sprain Keeping correct posture when sitting, standing or completing your activities will reduce the stress put on different body tissues, allowing injured tissues a chance to heal and limiting painful experiences. The following are general guidelines for improved posture. Your physician or physical therapist will provide  you with any instructions specific to your needs. While reading these guidelines, remember:  The exercises prescribed by your provider will help you have the flexibility and strength to maintain correct postures.  The correct posture provides the best environment for your joints to work. All of your joints have less wear and tear when properly supported by a spine with good posture. This means you will experience a healthier, less painful body.  Correct posture must be practiced with all of your activities, especially prolonged sitting and standing. Correct posture is as important when doing repetitive low-stress activities (typing) as it is when doing a single heavy-load activity (lifting).  RESTING POSITIONS Consider which positions are most painful for you when choosing a resting position. If you have pain with flexion-based activities (sitting, bending, stooping, squatting), choose a position that allows you to rest in a less flexed posture. You would want to avoid curling into a fetal position on your side. If your pain worsens with extension-based activities (prolonged standing, working overhead), avoid resting in an extended position such as sleeping on your stomach. Most people will find more comfort when they rest with their spine in a more neutral position, neither too rounded nor too arched. Lying on a non-sagging bed on your side with a pillow between your knees, or on your back with a pillow under your knees will often provide some relief. Keep in mind, being in any one position for a prolonged period of time, no matter  how correct your posture, can still lead to stiffness. PROPER SITTING POSTURE In order to minimize stress and discomfort on your spine, you must sit with correct posture. Sitting with good posture should be effortless for a healthy body. Returning to good posture is a gradual process. Many people can work toward this most comfortably by using various supports until they have  the flexibility and strength to maintain this posture on their own. When sitting with proper posture, your ears will fall over your shoulders and your shoulders will fall over your hips. You should use the back of the chair to support your upper back. Your lower back will be in a neutral position, just slightly arched. You may place a small pillow or folded towel at the base of your lower back for  support.  When working at a desk, create an environment that supports good, upright posture. Without extra support, muscles tire, which leads to excessive strain on joints and other tissues. Keep these recommendations in mind:  CHAIR:  A chair should be able to slide under your desk when your back makes contact with the back of the chair. This allows you to work closely.  The chair's height should allow your eyes to be level with the upper part of your monitor and your hands to be slightly lower than your elbows.  BODY POSITION  Your feet should make contact with the floor. If this is not possible, use a foot rest.  Keep your ears over your shoulders. This will reduce stress on your neck and low back.  INCORRECT SITTING POSTURES  If you are feeling tired and unable to assume a healthy sitting posture, do not slouch or slump. This puts excessive strain on your back tissues, causing more damage and pain. Healthier options include:  Using more support, like a lumbar pillow.  Switching tasks to something that requires you to be upright or walking.  Talking a brief walk.  Lying down to rest in a neutral-spine position.  PROLONGED STANDING WHILE SLIGHTLY LEANING FORWARD  When completing a task that requires you to lean forward while standing in one place for a long time, place either foot up on a stationary 2-4 inch high object to help maintain the best posture. When both feet are on the ground, the lower back tends to lose its slight inward curve. If this curve flattens (or becomes too large), then  the back and your other joints will experience too much stress, tire more quickly, and can cause pain.  CORRECT STANDING POSTURES Proper standing posture should be assumed with all daily activities, even if they only take a few moments, like when brushing your teeth. As in sitting, your ears should fall over your shoulders and your shoulders should fall over your hips. You should keep a slight tension in your abdominal muscles to brace your spine. Your tailbone should point down to the ground, not behind your body, resulting in an over-extended swayback posture.   INCORRECT STANDING POSTURES  Common incorrect standing postures include a forward head, locked knees and/or an excessive swayback. WALKING Walk with an upright posture. Your ears, shoulders and hips should all line-up.  PROLONGED ACTIVITY IN A FLEXED POSITION When completing a task that requires you to bend forward at your waist or lean over a low surface, try to find a way to stabilize 3 out of 4 of your limbs. You can place a hand or elbow on your thigh or rest a knee on the surface you  are reaching across. This will provide you more stability, so that your muscles do not tire as quickly. By keeping your knees relaxed, or slightly bent, you will also reduce stress across your lower back. CORRECT LIFTING TECHNIQUES  DO :  Assume a wide stance. This will provide you more stability and the opportunity to get as close as possible to the object which you are lifting.  Tense your abdominals to brace your spine. Bend at the knees and hips. Keeping your back locked in a neutral-spine position, lift using your leg muscles. Lift with your legs, keeping your back straight.  Test the weight of unknown objects before attempting to lift them.  Try to keep your elbows locked down at your sides in order get the best strength from your shoulders when carrying an object.     Always ask for help when lifting heavy or awkward objects. INCORRECT  LIFTING TECHNIQUES DO NOT:   Lock your knees when lifting, even if it is a small object.  Bend and twist. Pivot at your feet or move your feet when needing to change directions.  Assume that you can safely pick up even a paperclip without proper posture.

## 2016-12-26 NOTE — Progress Notes (Signed)
Chief Complaint  Patient presents with  . Follow-up    little improvement---noticed a few more new bumps this morning....  . Headache    x 1 week    Subjective: Patient is a 56 y.o. female here for f/u rash, HA and LBP.  Pt presented with a rash with me earlier in the week. 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 Derm did a biopsy, drew labs and placed her on a pred taper. She notes that the area is having some tingling/burning pain, but looks better since starting that.  She has a frontal headache for the past 1.5 weeks. She has a hx of fibromyalgia which does not help. She did not tolerate Neurontin or Lyrica due to side effects. She is not having any dizziness, numbness, tingling, weakness or vision changes. She has a hx of migraines.  She also has a hx of LBP, b/l. Also over the past 1.5 weeks, she has been having worsening pain. This was prompted by increased yard work and working in her garden. She is not having any red flag signs/symptoms. She does not do any routine stretching or exercises for her low back. She used to be on chronic narcotics, and is asking for more.   ROS: MSK: +LBP Neuro: No weakness  Family History  Problem Relation Age of Onset  . Coronary artery disease Father   . Parkinson's disease Mother   . Cancer Sister     breast  . Diabetes Sister   . Diabetes Brother    Past Medical History:  Diagnosis Date  . Back injury    Chronic  . Cardiomyopathy (HCC)    hypertension controlled-helped with edema-Dr Nahser  . Chest pain    Normal heart catherization  . Collapsed lung   . Deafness    Left ear  . Degenerative joint disease    with neck surgery  . Depression   . Diastolic dysfunction    with possible mild LVOT gradient  . Family history of adverse reaction to anesthesia    " my daughter has PONV and gets very grumpy"  . Fibromyalgia   . Former cigarette smoker   . Gestational diabetes   .  H/O blood clots    due to side effect of medication  . Headache(784.0)    migraines  . Hypertension   . Leg cramps   . Neuropathy   . Pain    chronic   . Pneumonia   . Pneumothorax   . PTSD (post-traumatic stress disorder)   . Respiratory disorder    Allergies  Allergen Reactions  . Lyrica [Pregabalin]   . Neurontin [Gabapentin] Other (See Comments)    Sleepy all the time    Current Outpatient Prescriptions:  .  atorvastatin (LIPITOR) 20 MG tablet, Take 1 tablet (20 mg total) by mouth daily at 6 PM., Disp: 30 tablet, Rfl: 11 .  benzonatate (TESSALON) 100 MG capsule, Take 1 capsule (100 mg total) by mouth 3 (three) times daily as needed., Disp: 20 capsule, Rfl: 0 .  citalopram (CELEXA) 20 MG tablet, TAKE 1 TABLET(20 MG) BY MOUTH DAILY, Disp: 90 tablet, Rfl: 0 .  cyclobenzaprine (FLEXERIL) 10 MG tablet, Take 10 mg by mouth 3 (three) times daily as needed for muscle spasms., Disp: , Rfl:  .  hydrochlorothiazide (HYDRODIURIL) 25 MG tablet, Take 1 tablet (25 mg total) by mouth daily., Disp: 90 tablet, Rfl: 1 .  HYDROcodone-acetaminophen (NORCO/VICODIN) 5-325 MG tablet, Use for  breakthrough pain only. 1 tab every 12 hours as needed., Disp: 12 tablet, Rfl: 0 .  hydrOXYzine (ATARAX/VISTARIL) 25 MG tablet, TAKE 3 TO 4 TABLETS BY MOUTH EVERY 8 HOURS AS NEEDED FOR ANXIETY, Disp: 90 tablet, Rfl: 0 .  Kava, Piper methysticum, (KAVA KAVA PO), Take 1 each by mouth daily., Disp: , Rfl:  .  loperamide (IMODIUM A-D) 2 MG tablet, Take 2 mg by mouth 4 (four) times daily as needed for diarrhea or loose stools., Disp: , Rfl:  .  loratadine (CLARITIN) 10 MG tablet, Take 10 mg by mouth daily., Disp: , Rfl:  .  magnesium gluconate (MAGONATE) 500 MG tablet, Take 500 mg by mouth daily., Disp: , Rfl:  .  Melatonin 5 MG CAPS, Take 5-10 mg by mouth at bedtime as needed (for sleep)., Disp: , Rfl:  .  Menthol, Topical Analgesic, (BIOFREEZE EX), Apply 1 application topically 2 (two) times daily as needed (for  pain)., Disp: , Rfl:  .  Menthol, Topical Analgesic, (ICY HOT EX), Apply 1 patch topically daily as needed (for pain). , Disp: , Rfl:  .  metoprolol tartrate (LOPRESSOR) 25 MG tablet, Take 1 tablet (25 mg total) by mouth 2 (two) times daily., Disp: 60 tablet, Rfl: 11 .  vitamin B-12 (CYANOCOBALAMIN) 500 MCG tablet, Take 500 mcg by mouth daily., Disp: , Rfl:  .  naproxen (NAPROSYN) 500 MG tablet, Take 1 tablet (500 mg total) by mouth 2 (two) times daily with a meal., Disp: 60 tablet, Rfl: 0 .  traMADol (ULTRAM) 50 MG tablet, Take 1 tablet (50 mg total) by mouth every 8 (eight) hours as needed., Disp: 30 tablet, Rfl: 0  Objective: BP 120/80   Pulse 67   Temp 98.5 F (36.9 C) (Oral)   Wt 158 lb 12 oz (72 kg)   SpO2 99%   BMI 26.42 kg/m  General: Awake, appears stated age HEENT: MMM, EOMi Heart: RRR, no murmurs Lungs: CTAB, no rales, wheezes or rhonchi. No accessory muscle use Neuro: No cerebellar signs, 3/4 patellar DTR, 2/4 biceps and calcaneal DTR's, no clonus MSK: 5/5 strength throughout in UE's and LE's b/l, nml gait, +TTP over R lumbar paraspinal musculature, +TTP over b/l cerv paraspinal musculature Psych: Age appropriate judgment and insight, normal affect and mood  Assessment and Plan: Chronic neck pain - Plan: Ambulatory referral to Pain Clinic  Acute right-sided low back pain without sciatica - Plan: HYDROcodone-acetaminophen (NORCO/VICODIN) 5-325 MG tablet, naproxen (NAPROSYN) 500 MG tablet  Neuropathic pain - Plan: traMADol (ULTRAM) 50 MG tablet  Orders as above. I am pleased that she has a plan in place for her suspected vasculitis. Will refer to pain management for both her chronic neck and chronic back pain. Home stretches and exercises were given for both. Naproxen, heat. Tramadol for combination of acute pain and neuropathic pain. Norco only for breakthrough.  F/u prn. The patient voiced understanding and agreement to the plan.  Jilda Rocheicholas Paul [place], DO 12/26/16   11:00 AM

## 2016-12-28 NOTE — Telephone Encounter (Addendum)
Pt was scheduled on (12/25/16) to be seen at Saturday clinic on.//AB/CMA

## 2017-01-01 ENCOUNTER — Telehealth: Payer: Self-pay | Admitting: Family Medicine

## 2017-01-01 NOTE — Telephone Encounter (Signed)
Caller name: Relationship to patient: Self  Can be reached: [phone] Pharmacy:  Reason for call: Patient saw Dr. Carmelia RollerWendling last week and states that she does not have what he thought she has. Request call back to discuss starting an antibiotic soon

## 2017-01-01 NOTE — Telephone Encounter (Signed)
Called and spoke with the pt and she stated that her recent blood work that was done by the Dermatologist showed elevated WBC.  So she was told by the Dermatologist that we need to start her on a ATB.  Informed the pt that the Dermatologist need to address the blood work since they ordered it.  She stated that they told her to call us.  Informed the pt that we can't do anything because we don't have any notes or lab results regarding was is going on and what is been treated.   Informed her that she can give them a call back and ask them to send the information to Dr. Carmelia RollerWendling.  Pt verbalized understanding and agreed.  She stated that she will give the Dermatologist a call back and have them send us the information.  Pt was given the office fax number.//AB/CMA

## 2017-01-04 NOTE — Telephone Encounter (Signed)
Called and spoke with the pt and informed her that we received the records and lab results from the Dermatologist.  Dr. Carmelia RollerWendling reviewed the records and lab results and per Dr. Carmelia RollerWendling she did not need to be on an ATB's.  He stated that the WBC was elevated because of the rash.  Pt verbalized understanding and agreed.  Pt had no other questions or concerns.//AB/CMA

## 2017-01-04 NOTE — Telephone Encounter (Signed)
Patient request call back to discuss what she needs to do about her situation. Plse call back.

## 2017-01-08 ENCOUNTER — Other Ambulatory Visit: Payer: Self-pay | Admitting: Cardiovascular Disease

## 2017-01-14 DIAGNOSIS — M5412 Radiculopathy, cervical region: Secondary | ICD-10-CM | POA: Diagnosis not present

## 2017-02-08 ENCOUNTER — Other Ambulatory Visit: Payer: Self-pay | Admitting: Family Medicine

## 2017-02-08 ENCOUNTER — Other Ambulatory Visit: Payer: Self-pay | Admitting: Cardiovascular Disease

## 2017-02-08 DIAGNOSIS — F410 Panic disorder [episodic paroxysmal anxiety] without agoraphobia: Secondary | ICD-10-CM

## 2017-02-16 DIAGNOSIS — M5412 Radiculopathy, cervical region: Secondary | ICD-10-CM | POA: Diagnosis not present

## 2017-02-16 DIAGNOSIS — M545 Low back pain: Secondary | ICD-10-CM | POA: Diagnosis not present

## 2017-02-18 ENCOUNTER — Other Ambulatory Visit: Payer: Self-pay | Admitting: Family Medicine

## 2017-02-18 DIAGNOSIS — F410 Panic disorder [episodic paroxysmal anxiety] without agoraphobia: Secondary | ICD-10-CM

## 2017-02-22 ENCOUNTER — Other Ambulatory Visit: Payer: Self-pay | Admitting: Cardiovascular Disease

## 2017-02-26 DIAGNOSIS — M545 Low back pain: Secondary | ICD-10-CM | POA: Diagnosis not present

## 2017-02-26 DIAGNOSIS — M5412 Radiculopathy, cervical region: Secondary | ICD-10-CM | POA: Diagnosis not present

## 2017-03-04 ENCOUNTER — Other Ambulatory Visit: Payer: Self-pay | Admitting: Family Medicine

## 2017-03-04 DIAGNOSIS — F410 Panic disorder [episodic paroxysmal anxiety] without agoraphobia: Secondary | ICD-10-CM

## 2017-03-05 NOTE — Telephone Encounter (Signed)
Relation to FA:OZHYpt:self Call back number: [phone]  [id number] [address], [place] - [id number] [facility] [address] [phone] (Phone) [phone] (Fax)   Significant History/Details     Reason for call:  Patient checking on the status of 3 month supply hydrOXYzine (ATARAX/VISTARIL) 25 MG tablet

## 2017-03-08 NOTE — Telephone Encounter (Signed)
Requesting Hydroxyzine 25mg -Take 3 to 4 tablets by mouth every 8 hours as needed for anxiety. Last refill:02/22/17;#90,0 Last OV:12/26/16 Please advise.//AB/CMA

## 2017-03-08 NOTE — Telephone Encounter (Signed)
[facility] [facility]  Reason for call: Pt called to f/u on request for hydroxyzine. She takes 3-4 per day and said that she needs it sent asap.

## 2017-03-11 DIAGNOSIS — M5412 Radiculopathy, cervical region: Secondary | ICD-10-CM | POA: Diagnosis not present

## 2017-03-11 DIAGNOSIS — M545 Low back pain: Secondary | ICD-10-CM | POA: Diagnosis not present

## 2017-03-16 ENCOUNTER — Other Ambulatory Visit: Payer: Self-pay | Admitting: Family Medicine

## 2017-03-18 DIAGNOSIS — M5412 Radiculopathy, cervical region: Secondary | ICD-10-CM | POA: Diagnosis not present

## 2017-03-18 DIAGNOSIS — M545 Low back pain: Secondary | ICD-10-CM | POA: Diagnosis not present

## 2017-03-19 NOTE — Telephone Encounter (Signed)
Rx approved and sent to the pharmacy by e-script.//AB/CMA 

## 2017-03-25 DIAGNOSIS — M545 Low back pain: Secondary | ICD-10-CM | POA: Diagnosis not present

## 2017-03-25 DIAGNOSIS — M5412 Radiculopathy, cervical region: Secondary | ICD-10-CM | POA: Diagnosis not present

## 2017-04-01 DIAGNOSIS — M5412 Radiculopathy, cervical region: Secondary | ICD-10-CM | POA: Diagnosis not present

## 2017-04-01 DIAGNOSIS — M545 Low back pain: Secondary | ICD-10-CM | POA: Diagnosis not present

## 2017-04-07 ENCOUNTER — Other Ambulatory Visit: Payer: Self-pay | Admitting: Cardiovascular Disease

## 2017-04-22 ENCOUNTER — Other Ambulatory Visit: Payer: Self-pay | Admitting: Cardiovascular Disease

## 2017-05-02 IMAGING — CT CT L SPINE W/ CM
3 of 4 series · 9 of 33 positions shown, 11 images · IV contrast (Omni 300)
Comparison: none

CLINICAL DATA: Neck pain. Back pain. Cervical and lumbar
radiculopathy.
TECHNIQUE: Contiguous axial images were obtained through the Cervical,
Thoracic, and Lumbar spine after the intrathecal infusion of
infusion. Coronal and sagittal reconstructions were obtained of the
axial image sets.

[Series 4: l-spine 2.0 st · axial · 0.40mm/px · z∈[-612,-612]mm · 1 of 135 slices shown, 2 images]
[im 77/135  soft-tissue]
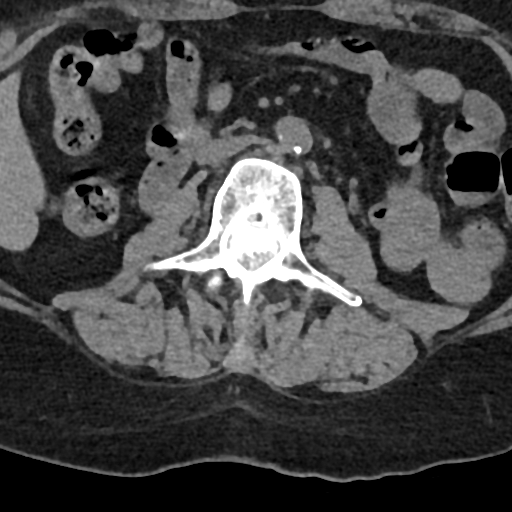
[im 77/135  bone]
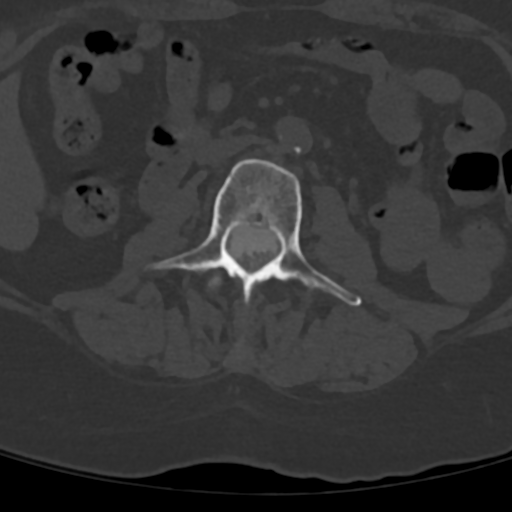

[Series 9: l-spine 2.0 cor bone · coronal · 0.23mm/px · 3 of 69 slices shown]
[im 14/69  bone]
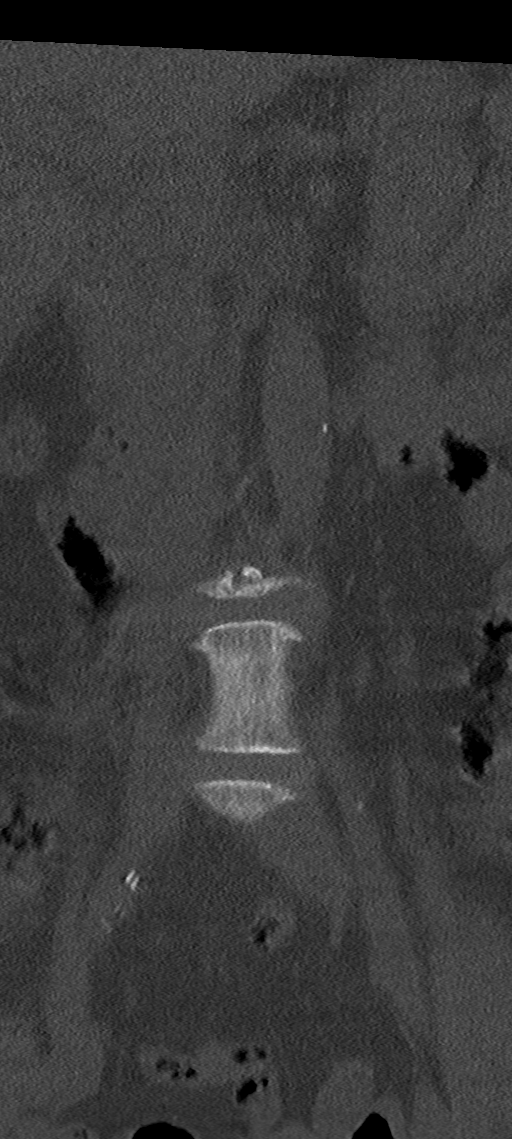
[im 28/69  bone]
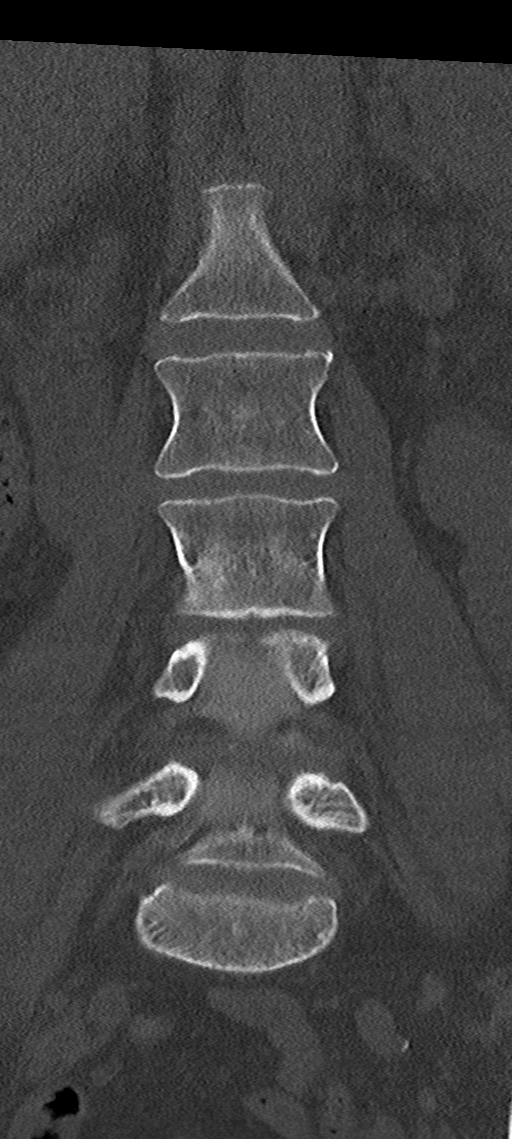
[im 41/69  bone]
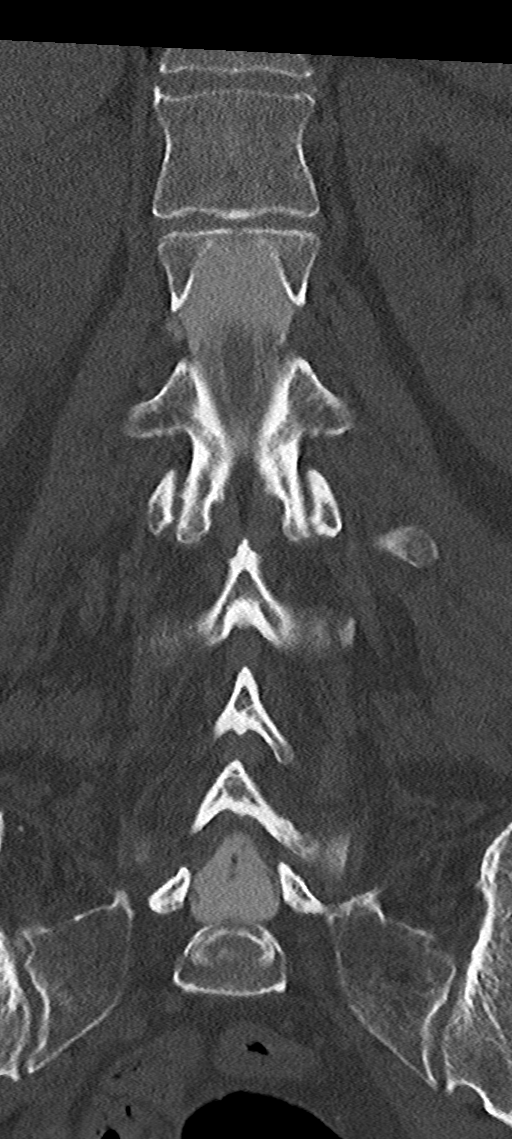

[Series 10: l-spine 2.0 sag bone · sagittal · 0.32mm/px · 5 of 79 slices shown, 6 images]
[im 27/79  bone]
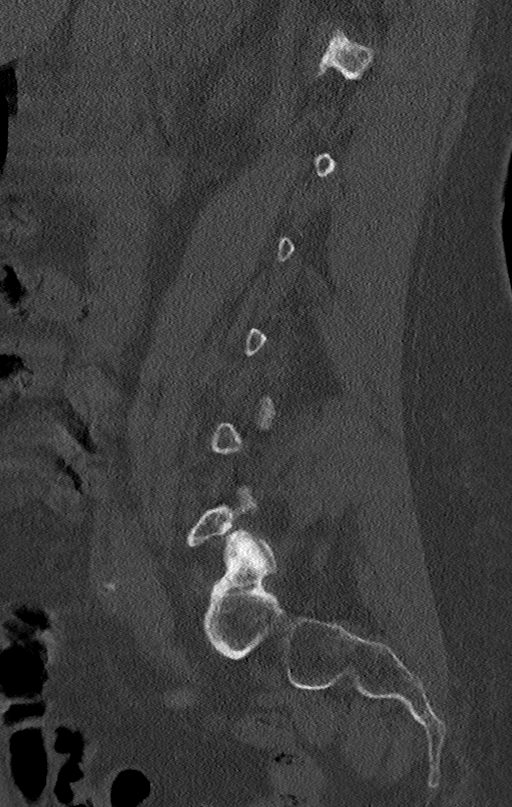
[im 33/79  bone]
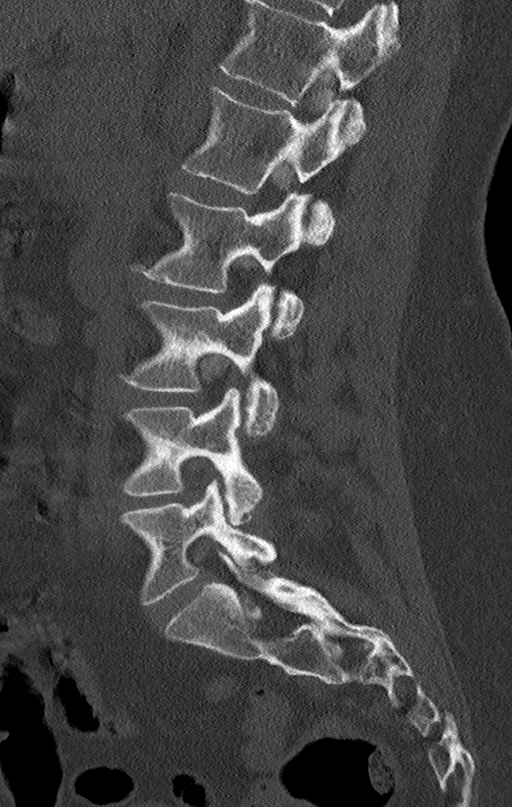
[im 40/79  soft-tissue]
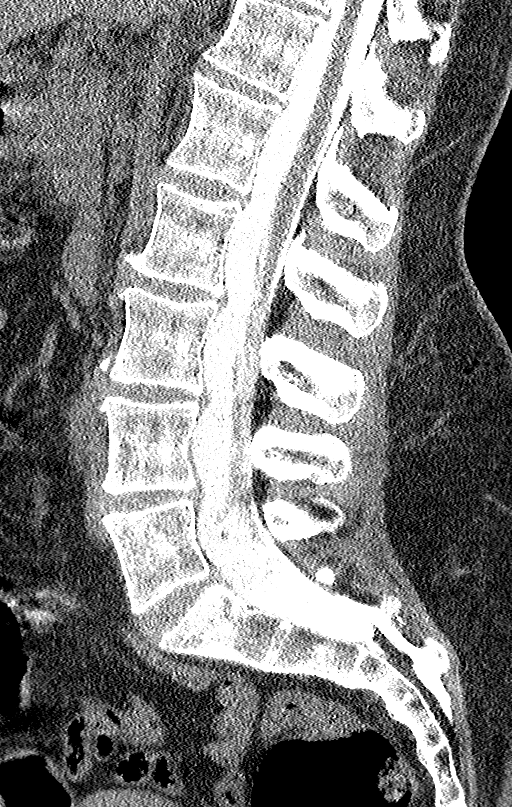
[im 40/79  bone]
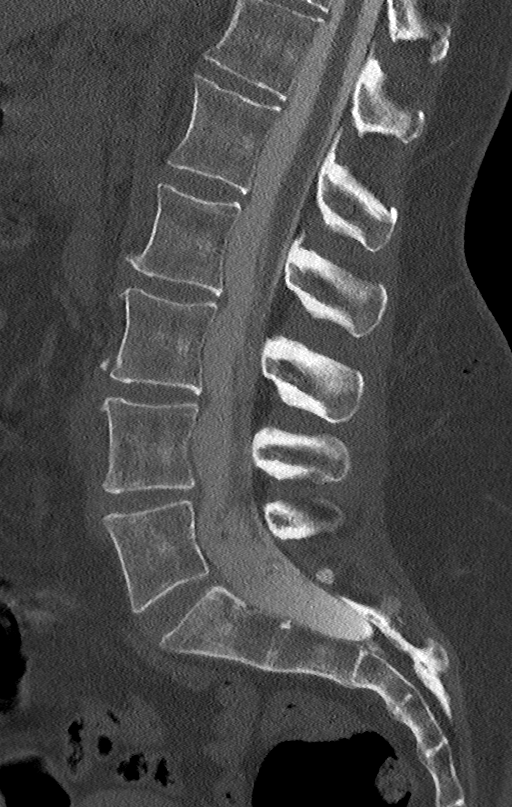
[im 46/79  bone]
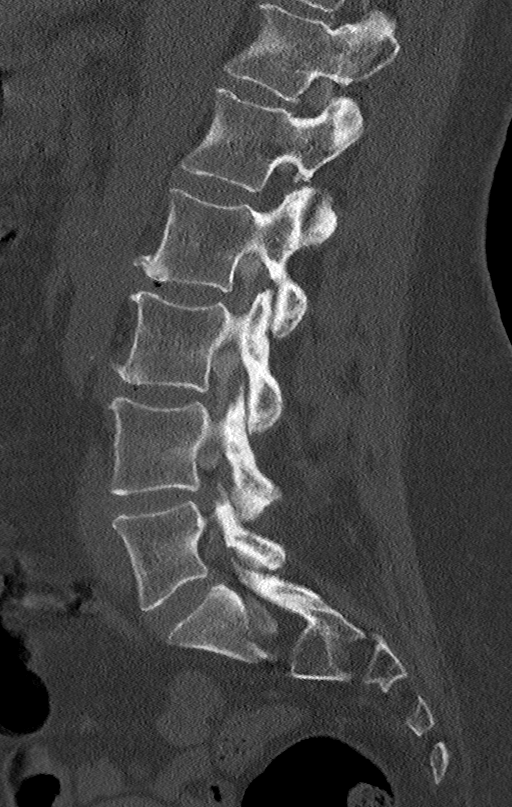
[im 53/79  bone]
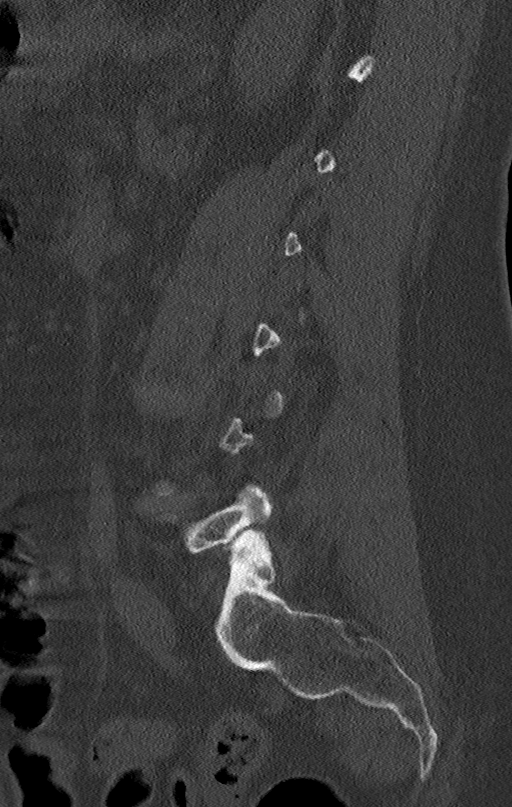

[9 of 33 positions shown; findings below may reference images not displayed]

PROCEDURE:
CERVICAL AND LUMBAR AND THORACIC MYELOGRAM

CT CERVICAL MYELOGRAM

CT LUMBAR MYELOGRAM

CT THORACIC MYELOGRAM

Lumbar puncture and intrathecal contrast administration were
performed by Dr. Lazer Emma Who will separately report for the portion
of the procedure. I personally supervised acquisition of the
myelogram images.

.
FINDINGS: CERVICAL AND LUMBAR MYELOGRAM FINDINGS:

Lumbar puncture at L3-4 on the left. Patulous thecal sac in the
lumbar spine. No significant spinal stenosis. No nerve root cut off.
Mild disc bulging L3-4 and L4-5.

Disc prosthesis at C3-4. The metal plate on the superior endplate of
L4 has migrated anteriorly similar to prior studies. There is a
broken screw in the vertebral body of C5 from prior fusion. Solid
bony fusion C4-5 and C5-6. No significant spinal stenosis.

CT CERVICAL MYELOGRAM FINDINGS:

Normal alignment. Negative for fracture or mass. Disc prosthesis at
C3-4 with mild anterior migration of the lower half of the
prosthesis unchanged from prior studies. Solid fusion at C4-5 and
C5-6. At broken screw in the C5 vertebral body on the right.

Patulous spinal canal without significant spinal stenosis. No
significant foraminal encroachment. Mild disc degeneration and
spurring at C6-7.

CT LUMBAR MYELOGRAM FINDINGS:

Mild retrolisthesis L4-5. Negative for fracture or mass. No focal
bony lesion.

Patulous thecal sac without significant spinal stenosis. Conus
medullaris normal and terminates at L2-3

L1-2:  Mild facet degeneration without stenosis

L2-3: Mild disc bulging and mild facet degeneration without
significant spinal stenosis

L3-4:  Mild disc bulging without significant spinal stenosis

L4-5: Mild disc bulging and mild facet degeneration. No significant
spinal stenosis.

L5-S1: Negative

CT THORACIC MYELOGRAM FINDINGS:

Normal thoracic alignment. Negative for fracture or mass lesion. No
focal bony lesion identified.

Normal thoracic cord. No significant spinal stenosis. Generous size
spinal canal.

Negative for disc protrusion. Mild disc degeneration at multiple
levels without significant spurring or stenosis.
IMPRESSION: Chronic anterior migration of the lower portion of the disc
prosthesis at C3-4 unchanged from prior studies. Solid fusion C4-5
and C5-6. Mild spondylosis at C6-7. No significant cervical spinal
stenosis

Mild thoracic disc degeneration without disc protrusion or stenosis

Mild lumbar disc and facet degeneration without disc protrusion or
stenosis.

## 2017-05-02 IMAGING — RF DG MYELOGRAM 2+ REGIONS
13 series · 13 of 13 positions shown · non-contrast
Comparison: none

CLINICAL DATA: Neck pain. Back pain. Cervical and lumbar
radiculopathy.
TECHNIQUE: Contiguous axial images were obtained through the Cervical,
Thoracic, and Lumbar spine after the intrathecal infusion of
infusion. Coronal and sagittal reconstructions were obtained of the
axial image sets.

[Series 1: cp_standard · 0.19mm/px · 1 of 1 slices shown (1 of 2)]
[im 1/1]
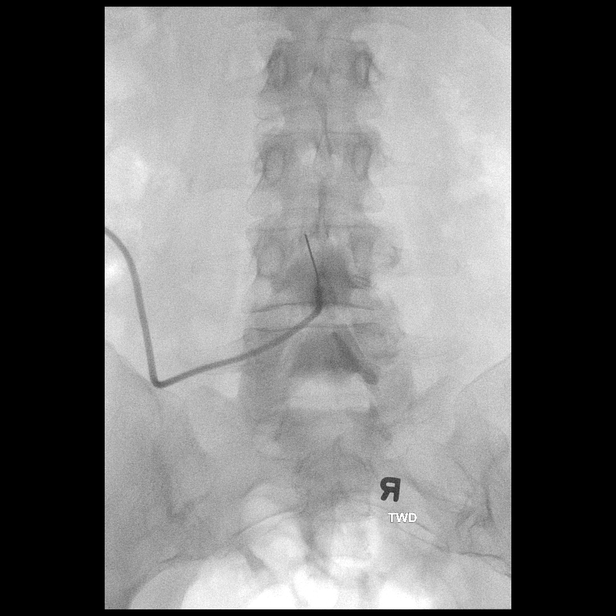

[Series 2: cp_standard · 0.19mm/px · 1 of 1 slices shown (2 of 2)]
[im 1/1]
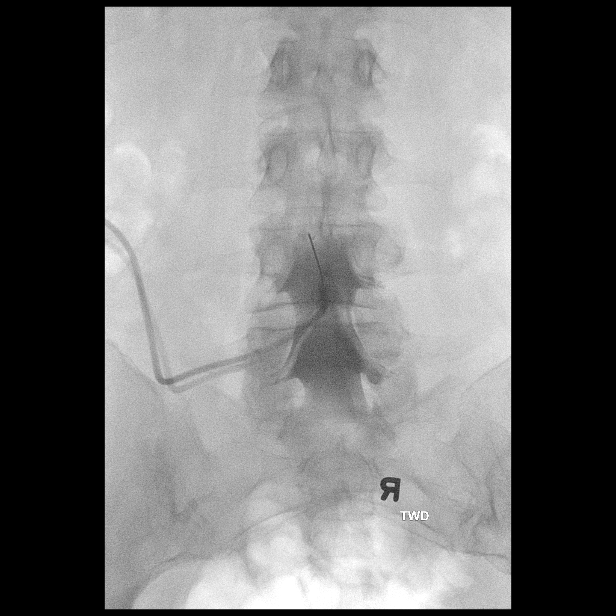

[Series 4: fluoro_myelogram_singleshot_bw · 0.22mm/px · 1 of 1 slices shown (1 of 10)]
[im 1/1]
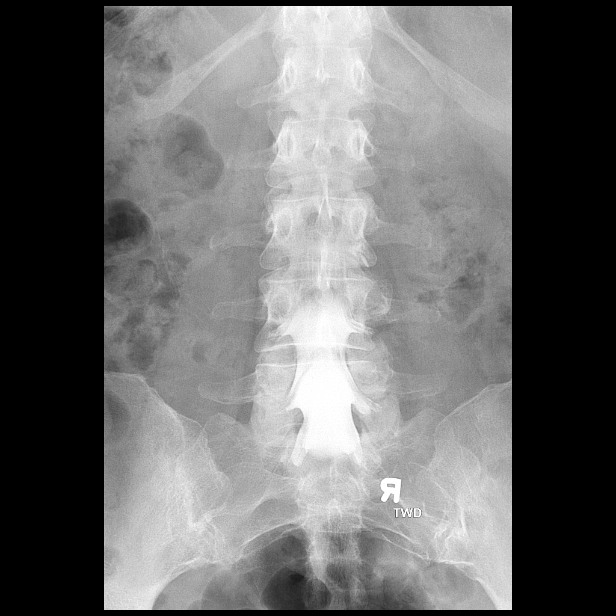

[Series 5: fluoro_myelogram_singleshot_bw · 0.23mm/px · 1 of 1 slices shown (2 of 10)]
[im 1/1]
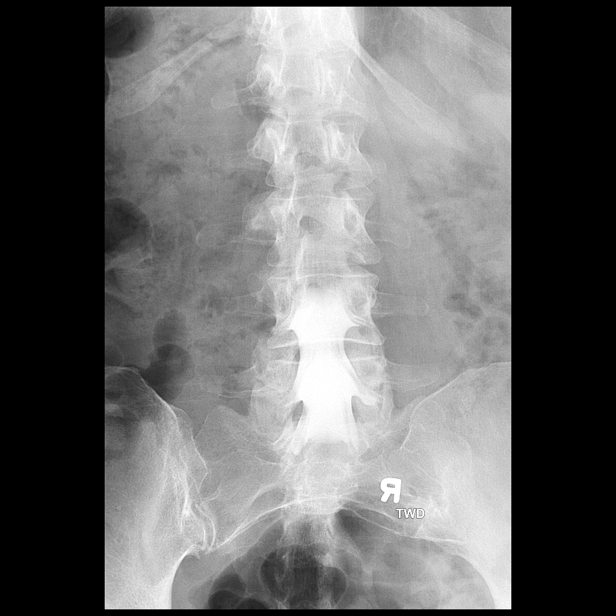

[Series 6: fluoro_myelogram_singleshot_bw · 0.23mm/px · 1 of 1 slices shown (3 of 10)]
[im 1/1]
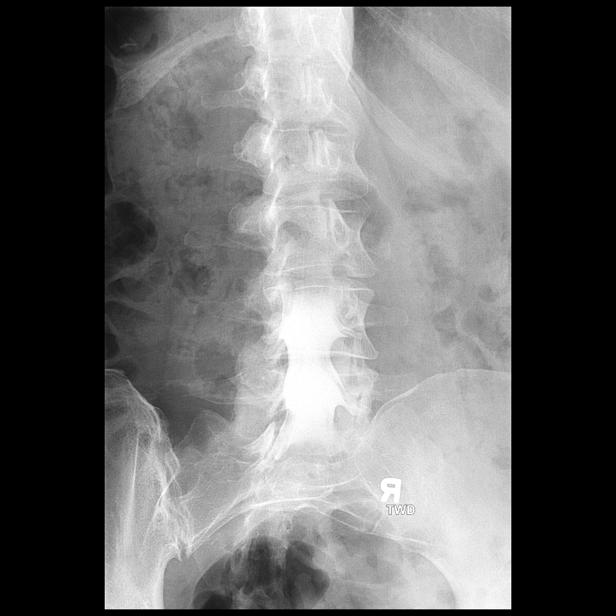

[Series 7: fluoro_myelogram_singleshot_bw · 0.20mm/px · 1 of 1 slices shown (4 of 10)]
[im 1/1]
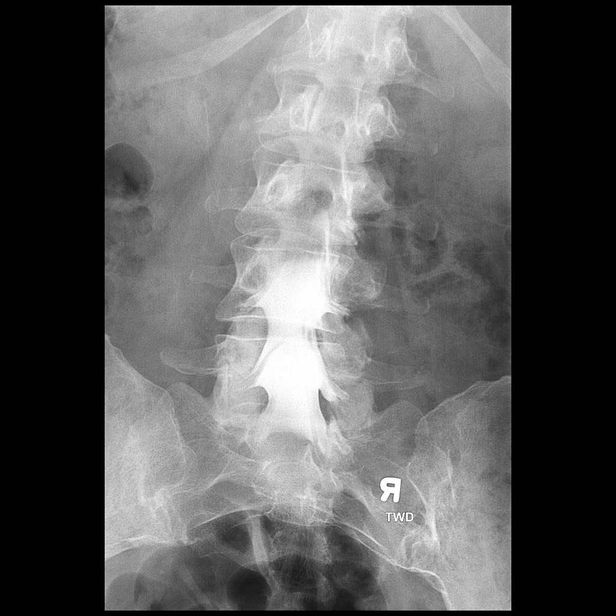

[Series 8: fluoro_myelogram_singleshot_bw · 0.21mm/px · 1 of 1 slices shown (5 of 10)]
[im 1/1]
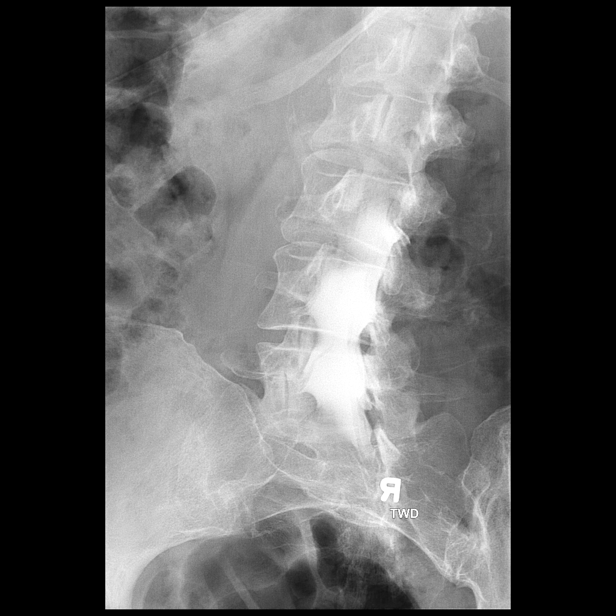

[Series 9: fluoro_myelogram_singleshot_bw · 0.21mm/px · 1 of 1 slices shown (6 of 10)]
[im 1/1]
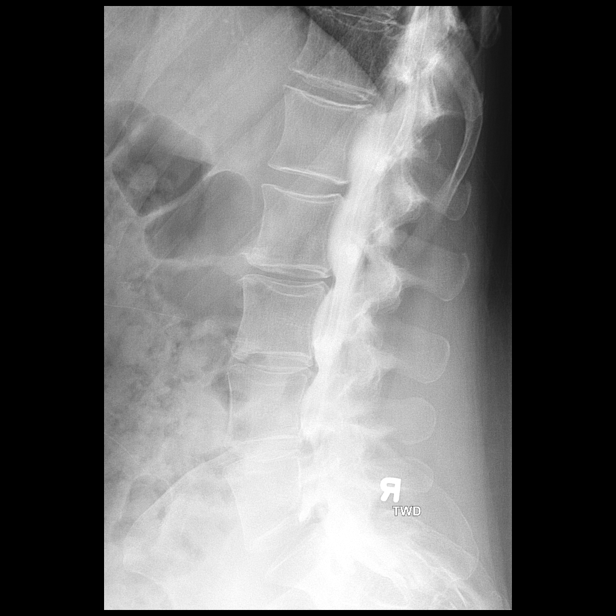

[Series 10: fluoro_myelogram_singleshot_bw · 0.21mm/px · 1 of 1 slices shown (7 of 10)]
[im 1/1]
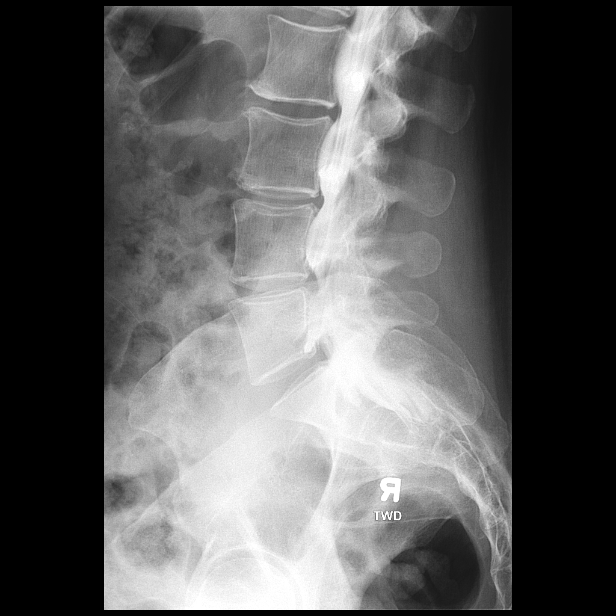

[Series 11: fluoro_myelogram_singleshot_bw · 0.21mm/px · 1 of 1 slices shown (8 of 10)]
[im 1/1]
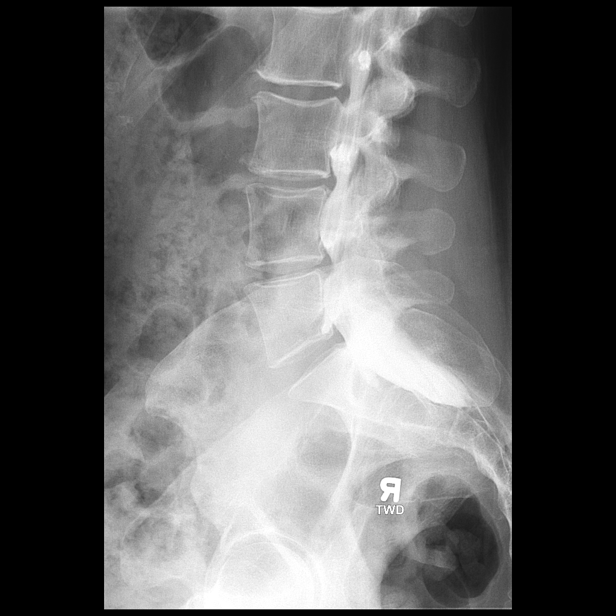

[Series 12: fluoro_myelogram_singleshot_bw · 0.21mm/px · 1 of 1 slices shown (9 of 10)]
[im 1/1]
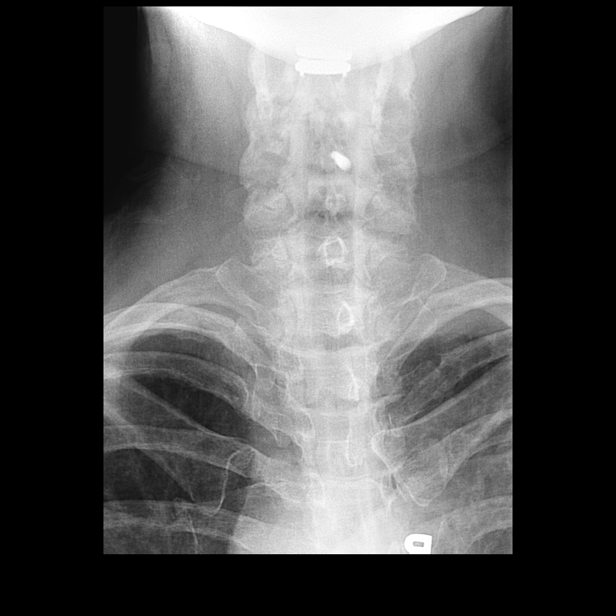

[Series 13: fluoro_myelogram_singleshot_bw · 0.21mm/px · 1 of 1 slices shown (10 of 10)]
[im 1/1]
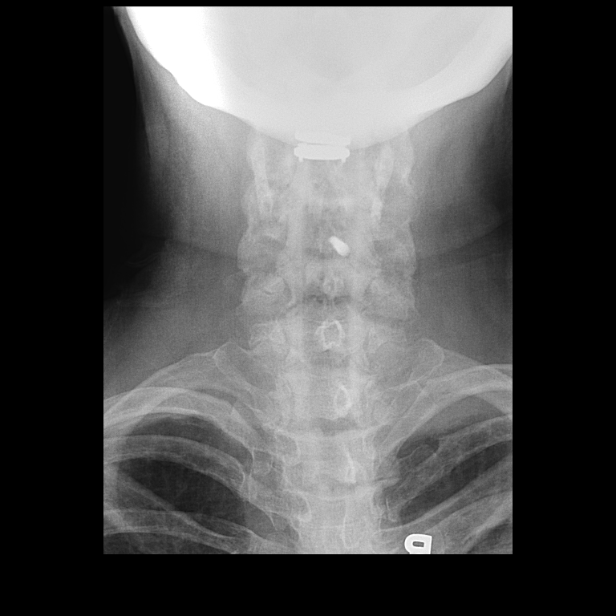

[Series 14: x cervical spine lat · 0.15mm/px · 1 of 1 slices shown]
[im 1/1]
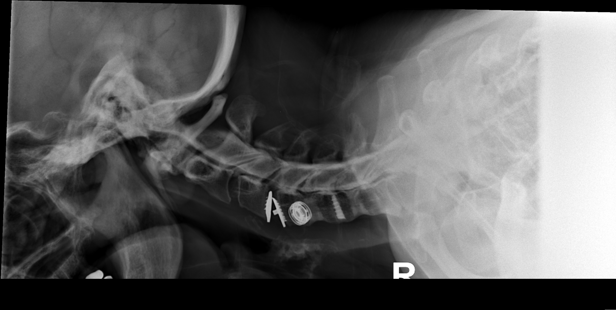

[13 of 13 positions shown; findings below may reference images not displayed]

PROCEDURE:
CERVICAL AND LUMBAR AND THORACIC MYELOGRAM

CT CERVICAL MYELOGRAM

CT LUMBAR MYELOGRAM

CT THORACIC MYELOGRAM

Lumbar puncture and intrathecal contrast administration were
performed by Dr. Lazer Emma Who will separately report for the portion
of the procedure. I personally supervised acquisition of the
myelogram images.

.
FINDINGS: CERVICAL AND LUMBAR MYELOGRAM FINDINGS:

Lumbar puncture at L3-4 on the left. Patulous thecal sac in the
lumbar spine. No significant spinal stenosis. No nerve root cut off.
Mild disc bulging L3-4 and L4-5.

Disc prosthesis at C3-4. The metal plate on the superior endplate of
L4 has migrated anteriorly similar to prior studies. There is a
broken screw in the vertebral body of C5 from prior fusion. Solid
bony fusion C4-5 and C5-6. No significant spinal stenosis.

CT CERVICAL MYELOGRAM FINDINGS:

Normal alignment. Negative for fracture or mass. Disc prosthesis at
C3-4 with mild anterior migration of the lower half of the
prosthesis unchanged from prior studies. Solid fusion at C4-5 and
C5-6. At broken screw in the C5 vertebral body on the right.

Patulous spinal canal without significant spinal stenosis. No
significant foraminal encroachment. Mild disc degeneration and
spurring at C6-7.

CT LUMBAR MYELOGRAM FINDINGS:

Mild retrolisthesis L4-5. Negative for fracture or mass. No focal
bony lesion.

Patulous thecal sac without significant spinal stenosis. Conus
medullaris normal and terminates at L2-3

L1-2:  Mild facet degeneration without stenosis

L2-3: Mild disc bulging and mild facet degeneration without
significant spinal stenosis

L3-4:  Mild disc bulging without significant spinal stenosis

L4-5: Mild disc bulging and mild facet degeneration. No significant
spinal stenosis.

L5-S1: Negative

CT THORACIC MYELOGRAM FINDINGS:

Normal thoracic alignment. Negative for fracture or mass lesion. No
focal bony lesion identified.

Normal thoracic cord. No significant spinal stenosis. Generous size
spinal canal.

Negative for disc protrusion. Mild disc degeneration at multiple
levels without significant spurring or stenosis.
IMPRESSION: Chronic anterior migration of the lower portion of the disc
prosthesis at C3-4 unchanged from prior studies. Solid fusion C4-5
and C5-6. Mild spondylosis at C6-7. No significant cervical spinal
stenosis

Mild thoracic disc degeneration without disc protrusion or stenosis

Mild lumbar disc and facet degeneration without disc protrusion or
stenosis.

## 2017-05-03 ENCOUNTER — Other Ambulatory Visit: Payer: Self-pay | Admitting: Cardiovascular Disease

## 2017-05-05 ENCOUNTER — Ambulatory Visit: Payer: BLUE CROSS/BLUE SHIELD | Admitting: Family Medicine

## 2017-05-05 ENCOUNTER — Encounter: Payer: Self-pay | Admitting: Family Medicine

## 2017-05-05 ENCOUNTER — Ambulatory Visit (INDEPENDENT_AMBULATORY_CARE_PROVIDER_SITE_OTHER): Payer: BLUE CROSS/BLUE SHIELD | Admitting: Family Medicine

## 2017-05-05 VITALS — BP 130/82 | HR 70 | Temp 97.7°F | Ht 65.0 in | Wt 165.8 lb

## 2017-05-05 DIAGNOSIS — H7292 Unspecified perforation of tympanic membrane, left ear: Secondary | ICD-10-CM | POA: Diagnosis not present

## 2017-05-05 MED ORDER — CIPROFLOXACIN-DEXAMETHASONE 0.3-0.1 % OT SUSP: 4.0000 [drp] | Freq: Two times a day (BID) | OTIC | 0 refills | Status: DC

## 2017-05-05 NOTE — Progress Notes (Signed)
Chief Complaint  Patient presents with  . Acute Visit  . Ear Pain    Left ear, on going for the past 3 days, she does state she has some drainage in that ear    Pt is here for left ear pain.  Duration: 3 days Progression: unchanged Associated symptoms: drainage Denies: URI symptoms, fevers, bleeding Treatment to date: None   ROS:  HEENT: +ear pain Costitutional: Denies fevers  Past Medical History:  Diagnosis Date  . Back injury    Chronic  . Cardiomyopathy (HCC)    hypertension controlled-helped with edema-Dr Nahser  . Chest pain    Normal heart catherization  . Collapsed lung   . Deafness    Left ear  . Degenerative joint disease    with neck surgery  . Depression   . Diastolic dysfunction    with possible mild LVOT gradient  . Family history of adverse reaction to anesthesia    " my daughter has PONV and gets very grumpy"  . Fibromyalgia   . Former cigarette smoker   . Gestational diabetes   . H/O blood clots    due to side effect of medication  . Headache(784.0)    migraines  . Hypertension   . Leg cramps   . Neuropathy   . Pain    chronic   . Pneumonia   . Pneumothorax   . PTSD (post-traumatic stress disorder)   . Respiratory disorder    Family History  Problem Relation Age of Onset  . Coronary artery disease Father   . Parkinson's disease Mother   . Cancer Sister        breast  . Diabetes Sister   . Diabetes Brother    Past Surgical History:  Procedure Laterality Date  . ABDOMINAL HYSTERECTOMY     partial  . CARDIAC CATHETERIZATION     Ejection Fraction 65-70%  . CERVICAL DISC ARTHROPLASTY N/A 02/21/2016   Procedure: Cervical three-four artificial disc replacement;  Surgeon: Barnett AbuHenry Elsner, MD;  Location: [facility];  Service: Neurosurgery;  Laterality: N/A;  . CERVICAL FUSION     C5-6  . 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 HERNIA REPAIR     " as a baby"  . NECK SURGERY    . OTHER SURGICAL HISTORY     History of cervical and lumbar disk surgery  . polypectomies     tubes  . TONSILLECTOMY AND ADENOIDECTOMY      BP 130/82 (BP Location: Left Arm, Patient Position: Sitting, Cuff Size: Normal)   Pulse 70   Temp 97.7 F (36.5 C) (Oral)   Ht 5\' 5"  (1.651 m)   Wt 165 lb 12.8 oz (75.2 kg)   SpO2 98%   BMI 27.59 kg/m  General: Awake, alert, appearing stated age HEENT:  L ear- Canal patent without drainage or erythema, TM is retracted with a small perforation at 5 o'clock R ear- canal patent without drainage or erythema, TM is neg Nose- nares patent and without discharge Mouth- Lips, gums and dentition unremarkable, pharynx is without erythema or exudate Neck: No adenopathy Lungs: Normal effort, no accessory muscle use Psych: Age appropriate judgment and insight, normal mood and affect  Perforation of left tympanic membrane - Plan: ciprofloxacin-dexamethasone (CIPRODEX) OTIC suspension  Orders as above. F/u in 2 weeks if symptoms fail to improve, sooner if needed. Pt voiced understanding and agreement to the plan.  Jilda Rocheicholas Paul [place], DO 05/05/17 4:45  PM

## 2017-05-05 NOTE — Patient Instructions (Signed)
Pus, bleeding, worsening pain, or fevers should prompt you to seek immediate care  Let us know if you need anything.

## 2017-05-26 ENCOUNTER — Ambulatory Visit: Payer: BLUE CROSS/BLUE SHIELD | Admitting: Family Medicine

## 2017-05-26 ENCOUNTER — Ambulatory Visit (INDEPENDENT_AMBULATORY_CARE_PROVIDER_SITE_OTHER): Payer: BLUE CROSS/BLUE SHIELD | Admitting: Family Medicine

## 2017-05-26 ENCOUNTER — Encounter: Payer: Self-pay | Admitting: Family Medicine

## 2017-05-26 VITALS — BP 128/78 | HR 67 | Temp 97.8°F | Ht 65.0 in | Wt 166.1 lb

## 2017-05-26 DIAGNOSIS — H7292 Unspecified perforation of tympanic membrane, left ear: Secondary | ICD-10-CM

## 2017-05-26 DIAGNOSIS — H6692 Otitis media, unspecified, left ear: Secondary | ICD-10-CM | POA: Diagnosis not present

## 2017-05-26 DIAGNOSIS — M545 Low back pain, unspecified: Secondary | ICD-10-CM

## 2017-05-26 DIAGNOSIS — F410 Panic disorder [episodic paroxysmal anxiety] without agoraphobia: Secondary | ICD-10-CM

## 2017-05-26 DIAGNOSIS — M792 Neuralgia and neuritis, unspecified: Secondary | ICD-10-CM | POA: Diagnosis not present

## 2017-05-26 MED ORDER — CIPROFLOXACIN HCL 0.2 % OT SOLN: 0.2000 mL | Freq: Two times a day (BID) | OTIC | 0 refills | Status: DC

## 2017-05-26 MED ORDER — ATORVASTATIN CALCIUM 20 MG PO TABS: ORAL_TABLET | ORAL | 1 refills | Status: DC

## 2017-05-26 MED ORDER — CYCLOBENZAPRINE HCL 10 MG PO TABS: 10.0000 mg | ORAL_TABLET | Freq: Three times a day (TID) | ORAL | 0 refills | Status: DC | PRN

## 2017-05-26 MED ORDER — AMOXICILLIN-POT CLAVULANATE 875-125 MG PO TABS: 1.0000 | ORAL_TABLET | Freq: Two times a day (BID) | ORAL | 0 refills | Status: DC

## 2017-05-26 MED ORDER — HYDROXYZINE HCL 25 MG PO TABS: ORAL_TABLET | ORAL | 11 refills | Status: DC

## 2017-05-26 MED ORDER — NAPROXEN 500 MG PO TABS: 500.0000 mg | ORAL_TABLET | Freq: Two times a day (BID) | ORAL | 0 refills | Status: DC

## 2017-05-26 MED ORDER — HYDROCHLOROTHIAZIDE 25 MG PO TABS: 25.0000 mg | ORAL_TABLET | Freq: Every day | ORAL | 1 refills | Status: DC

## 2017-05-26 MED ORDER — CITALOPRAM HYDROBROMIDE 20 MG PO TABS: ORAL_TABLET | ORAL | 1 refills | Status: DC

## 2017-05-26 NOTE — Progress Notes (Signed)
Chief Complaint  Patient presents with  . Otalgia    left ear    Pt is here for left ear pain. I saw her for this on 05/05/17 and dx'd her with a perf TM. She was placed on Cipro-HC and reported mild improvement. Still having drainage, odor and pain. No change in hearing, injury, or fevers.   ROS:  HEENT: +ear pain Costitutional: Denies fevers  Past Medical History:  Diagnosis Date  . Back injury    Chronic  . Cardiomyopathy (HCC)    hypertension controlled-helped with edema-Dr Nahser  . Chest pain    Normal heart catherization  . Collapsed lung   . Deafness    Left ear  . Degenerative joint disease    with neck surgery  . Depression   . Diastolic dysfunction    with possible mild LVOT gradient  . Family history of adverse reaction to anesthesia    " my daughter has PONV and gets very grumpy"  . Fibromyalgia   . Former cigarette smoker   . Gestational diabetes   . H/O blood clots    due to side effect of medication  . Headache(784.0)    migraines  . Hypertension   . Leg cramps   . Neuropathy   . Pain    chronic   . Pneumonia   . Pneumothorax   . PTSD (post-traumatic stress disorder)   . Respiratory disorder    Family History  Problem Relation Age of Onset  . Coronary artery disease Father   . Parkinson's disease Mother   . Cancer Sister        breast  . Diabetes Sister   . Diabetes Brother    Past Surgical History:  Procedure Laterality Date  . ABDOMINAL HYSTERECTOMY     partial  . CARDIAC CATHETERIZATION     Ejection Fraction 65-70%  . CERVICAL DISC ARTHROPLASTY N/A 02/21/2016   Procedure: Cervical three-four artificial disc replacement;  Surgeon: Barnett Abu, MD;  Location: [facility];  Service: Neurosurgery;  Laterality: N/A;  . CERVICAL FUSION     C5-6  . 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 HERNIA REPAIR     " as a baby"  . NECK SURGERY    . OTHER SURGICAL HISTORY     History of cervical and lumbar disk  surgery  . polypectomies     tubes  . TONSILLECTOMY AND ADENOIDECTOMY      BP 128/78 (BP Location: Left Arm, Patient Position: Sitting, Cuff Size: Normal)   Pulse 67   Temp 97.8 F (36.6 C) (Oral)   Ht  (1.651 m)   Wt 166 lb 2 oz (75.4 kg)   SpO2 96%   BMI 27.64 kg/m  General: Awake, alert, appearing stated age HEENT:  L ear- I cannot visualize the previous area with the perf, there is D/C surrounding the area that is obstructing that portion of the TM, canal is without erythema, external ear is mildly TTP, portion of TM that is visualized is without erythema.  R ear- canal patent without drainage or erythema, TM is neg Nose- nares patent and without discharge Mouth- Lips, gums and dentition unremarkable, pharynx is without erythema or exudate Neck: No adenopathy Lungs: Normal effort, no accessory muscle use Psych: Age appropriate judgment and insight, normal mood and affect  Acute otitis media of left ear with perforation - Plan: Ambulatory referral to ENT  Acute right-sided low back  pain without sciatica - Plan: naproxen (NAPROSYN) 500 MG tablet  Neuropathic pain  Panic attacks - Plan: hydrOXYzine (ATARAX/VISTARIL) 25 MG tablet  Given the purulent material, pain and hx of perf, I will also tx for inner ear infection, reorder cipro drops in meantime also. I would like her to be seen by ENT, thankfully they are able to see her tomorrow and she is awre of appt. Pt voiced understanding and agreement to the plan.  Jilda Roche [place], DO 05/26/17 4:03 PM

## 2017-05-26 NOTE — Progress Notes (Signed)
Pre visit review using our clinic review tool, if applicable. No additional management support is needed unless otherwise documented below in the visit note. 

## 2017-05-26 NOTE — Patient Instructions (Signed)
If you do not hear anything about your referral by tomorrow, call our office and ask for an update.  Keep using the cotton after using the drops.   Let us know if you need anything.

## 2017-05-27 DIAGNOSIS — Z8669 Personal history of other diseases of the nervous system and sense organs: Secondary | ICD-10-CM | POA: Diagnosis not present

## 2017-05-27 DIAGNOSIS — H7012 Chronic mastoiditis, left ear: Secondary | ICD-10-CM | POA: Insufficient documentation

## 2017-05-27 DIAGNOSIS — Z8709 Personal history of other diseases of the respiratory system: Secondary | ICD-10-CM | POA: Diagnosis not present

## 2017-05-27 DIAGNOSIS — H905 Unspecified sensorineural hearing loss: Secondary | ICD-10-CM | POA: Diagnosis not present

## 2017-06-03 DIAGNOSIS — Z6827 Body mass index (BMI) 27.0-27.9, adult: Secondary | ICD-10-CM | POA: Diagnosis not present

## 2017-06-03 DIAGNOSIS — M5412 Radiculopathy, cervical region: Secondary | ICD-10-CM | POA: Diagnosis not present

## 2017-06-21 ENCOUNTER — Other Ambulatory Visit: Payer: Self-pay | Admitting: Family Medicine

## 2017-06-21 DIAGNOSIS — M5412 Radiculopathy, cervical region: Secondary | ICD-10-CM | POA: Diagnosis not present

## 2017-06-21 DIAGNOSIS — M545 Low back pain, unspecified: Secondary | ICD-10-CM

## 2017-06-24 DIAGNOSIS — M545 Low back pain: Secondary | ICD-10-CM | POA: Diagnosis not present

## 2017-06-24 DIAGNOSIS — M5412 Radiculopathy, cervical region: Secondary | ICD-10-CM | POA: Diagnosis not present

## 2017-06-30 ENCOUNTER — Ambulatory Visit: Payer: BLUE CROSS/BLUE SHIELD | Admitting: Cardiovascular Disease

## 2017-06-30 DIAGNOSIS — M545 Low back pain: Secondary | ICD-10-CM | POA: Diagnosis not present

## 2017-06-30 DIAGNOSIS — M5412 Radiculopathy, cervical region: Secondary | ICD-10-CM | POA: Diagnosis not present

## 2017-07-21 ENCOUNTER — Other Ambulatory Visit: Payer: Self-pay | Admitting: Cardiovascular Disease

## 2017-07-21 ENCOUNTER — Other Ambulatory Visit: Payer: Self-pay | Admitting: Family Medicine

## 2017-07-21 DIAGNOSIS — M545 Low back pain, unspecified: Secondary | ICD-10-CM

## 2017-08-18 ENCOUNTER — Other Ambulatory Visit: Payer: Self-pay | Admitting: Family Medicine

## 2017-08-18 DIAGNOSIS — M545 Low back pain, unspecified: Secondary | ICD-10-CM

## 2017-08-30 ENCOUNTER — Other Ambulatory Visit: Payer: Self-pay | Admitting: Family Medicine

## 2017-09-07 ENCOUNTER — Encounter: Payer: Self-pay | Admitting: Cardiovascular Disease

## 2017-09-07 ENCOUNTER — Ambulatory Visit (INDEPENDENT_AMBULATORY_CARE_PROVIDER_SITE_OTHER): Payer: BLUE CROSS/BLUE SHIELD | Admitting: Cardiovascular Disease

## 2017-09-07 VITALS — BP 146/80 | HR 68 | Ht 65.0 in | Wt 170.8 lb

## 2017-09-07 DIAGNOSIS — I10 Essential (primary) hypertension: Secondary | ICD-10-CM | POA: Diagnosis not present

## 2017-09-07 DIAGNOSIS — R002 Palpitations: Secondary | ICD-10-CM | POA: Diagnosis not present

## 2017-09-07 NOTE — Patient Instructions (Signed)

## 2017-09-07 NOTE — Progress Notes (Signed)
[name] Date of Birth  Mar 11, 1961       [facility] Office [address]  [address] [place], [place]  [address]   [place], [place]  [address] [phone]     [phone]   Fax  [phone]    Fax [phone]  Problem List: 1. HTN 2. Palpitations 3. Neck surgery 4. Peripheral neuropathy 5. Chest pain 6. Dynamic LVOT obstruction  CONCLUSIONS: 1. Smooth and normal coronary arteries. 2. Hyperdynamic left ventricular systolic function.  She most likely     has a dynamic left ventricular outflow tract gradient.  She will     probably benefit from beta-blockers and calcium channel blockers.      History of Present Illness:  [name] is a 57 yo with hx of HTN and palpitations.  We had her on metoprolol.  She ran out of her metoprolol 6 months ago.  She has had some palpitations.   She stopped smoking but re-started again.    She is feeling well now.  Walking on occasion. She sees Yolonda Kida for her neuropathy.    She has occasional chest discomfort - last only a second or so.  Typically occurs after she twists her torso.    [name], [name] is seen after a 1 year absence Recently had bronchitis,  Still coughing. Still smoking a few cigarettes.  Trying to exercise regularly - typically swims regularly,  Limited by peripheral neuropathy.  September 07, 2017:   Seen back for office visit Has had congestion Still smoking .   Exercising some .    BP is slightly elevated    She watches her salt.  Has gained some weight    Current Outpatient Medications on File Prior to Visit  Medication Sig Dispense Refill  . atorvastatin (LIPITOR) 20 MG tablet TAKE 1 TABLET(20 MG) BY MOUTH DAILY AT 6 PM 90 tablet 1  . Ciprofloxacin HCl 0.2 % otic solution Place 0.2 mLs into the left ear 2 (two) times daily. 14 vial 0  . ciprofloxacin-dexamethasone (CIPRODEX) OTIC suspension Place 4 drops into the left ear 2 (two)  times daily. 7.5 mL 0  . citalopram (CELEXA) 20 MG tablet TAKE 1 TABLET(20 MG) BY MOUTH DAILY 90 tablet 0  . cyclobenzaprine (FLEXERIL) 10 MG tablet Take 1 tablet (10 mg total) by mouth 3 (three) times daily as needed for muscle spasms. 30 tablet 0  . hydrochlorothiazide (HYDRODIURIL) 25 MG tablet Take 1 tablet (25 mg total) by mouth daily. 90 tablet 1  . HYDROcodone-acetaminophen (NORCO/VICODIN) 5-325 MG tablet Use for breakthrough pain only. 1 tab every 12 hours as needed. 12 tablet 0  . hydrOXYzine (ATARAX/VISTARIL) 25 MG tablet TAKE 3 TO 4 TABLETS BY MOUTH EVERY 8 HOURS AS NEEDED FOR ANXIETY 90 tablet 11  . loratadine (CLARITIN) 10 MG tablet Take 10 mg by mouth daily.    . magnesium gluconate (MAGONATE) 500 MG tablet Take 500 mg by mouth daily.    . Melatonin 5 MG CAPS Take 5-10 mg by mouth at bedtime as needed (for sleep).    . Menthol, Topical Analgesic, (BIOFREEZE EX) Apply 1 application topically 2 (two) times daily as needed (for pain).    . Menthol, Topical Analgesic, (ICY HOT EX) Apply 1 patch topically daily as needed (for pain).     . metoprolol tartrate (LOPRESSOR) 25 MG tablet TAKE 1 TABLET BY MOUTH TWICE DAILY 60 tablet 1  .  naproxen (NAPROSYN) 500 MG tablet TAKE 1 TABLET(500 MG) BY MOUTH TWICE DAILY WITH A MEAL 60 tablet 0  . vitamin B-12 (CYANOCOBALAMIN) 500 MCG tablet Take 500 mcg by mouth daily.     No current facility-administered medications on file prior to visit.     Allergies  Allergen Reactions  . Lyrica [Pregabalin]   . Neurontin [Gabapentin] Other (See Comments)    Sleepy all the time    Past Medical History:  Diagnosis Date  . Back injury    Chronic  . Cardiomyopathy (HCC)    hypertension controlled-helped with edema-Dr Nahser  . Chest pain    Normal heart catherization  . Collapsed lung   . Deafness    Left ear  . Degenerative joint disease    with neck surgery  . Depression   . Diastolic dysfunction    with possible mild LVOT gradient  . Family  history of adverse reaction to anesthesia    " my daughter has PONV and gets very grumpy"  . Fibromyalgia   . Former cigarette smoker   . Gestational diabetes   . H/O blood clots    due to side effect of medication  . Headache(784.0)    migraines  . Hypertension   . Leg cramps   . Neuropathy   . Pain    chronic   . Pneumonia   . Pneumothorax   . PTSD (post-traumatic stress disorder)   . Respiratory disorder     Past Surgical History:  Procedure Laterality Date  . ABDOMINAL HYSTERECTOMY     partial  . CARDIAC CATHETERIZATION     Ejection Fraction 65-70%  . CERVICAL DISC ARTHROPLASTY N/A 02/21/2016   Procedure: Cervical three-four artificial disc replacement;  Surgeon: Barnett Abu, MD;  Location: [facility];  Service: Neurosurgery;  Laterality: N/A;  . CERVICAL FUSION     C5-6  . EXTERNAL EAR SURGERY     tumor at age 5, 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  . HERNIA REPAIR     " as a baby"  . NECK SURGERY    . OTHER SURGICAL HISTORY     History of cervical and lumbar disk surgery  . polypectomies     tubes  . TONSILLECTOMY AND ADENOIDECTOMY      Social History   Tobacco Use  Smoking Status Current Some Day Smoker  . Packs/day: 1.00  . Years: 25.00  . Pack years: 25.00  . Types: Cigarettes  Smokeless Tobacco Never Used    Social History   Substance and Sexual Activity  Alcohol Use Yes   Comment: very rare    Family History  Problem Relation Age of Onset  . Coronary artery disease Father   . Parkinson's disease Mother   . Cancer Sister        breast  . Diabetes Sister   . Diabetes Brother     Reviw of Systems:  Reviewed in the HPI.  All other systems are negative.   Physical Exam: Blood pressure (!) 146/80, pulse 68, height 5\' 5"  (1.651 m), weight 170 lb 12.8 oz (77.5 kg).  GEN:  Well nourished, well developed in no acute distress HEENT: Normal NECK: No JVD; No carotid bruits LYMPHATICS: No lymphadenopathy CARDIAC: RR , no significant murmur    RESPIRATORY:  Clear to auscultation without rales, wheezing or rhonchi  ABDOMEN: Soft, non-tender, non-distended MUSCULOSKELETAL:  No edema; No deformity  SKIN: Warm and dry NEUROLOGIC:  Alert and oriented x 3   ECG September 07, 2017:  Normal sinus rhythm at 68.  T wave inversions in the inferior and lateral leads-no changes from previous.  Assessment / Plan:   1. HTN -  overally doing ok. Needs to stop smoking.  Needs to exercise more   2. Palpitations - stable  3. Neck surgery 4. Peripheral neuropathy - has some radiation of pain down her arms.   5. Chest pain -no significant episodes of chest pain. 6. Dynamic LVOT obstruction    Kristeen MissPhilip Nahser, MD  09/07/2017 4:50 PM    [facility] [address]  [address] [place], [place]  [address] Pager [phone]- [phone] Phone: [phone]; Fax: [phone]

## 2017-09-09 DIAGNOSIS — H7012 Chronic mastoiditis, left ear: Secondary | ICD-10-CM | POA: Diagnosis not present

## 2017-09-16 ENCOUNTER — Other Ambulatory Visit: Payer: Self-pay | Admitting: Family Medicine

## 2017-09-16 DIAGNOSIS — M545 Low back pain, unspecified: Secondary | ICD-10-CM

## 2017-09-16 DIAGNOSIS — H906 Mixed conductive and sensorineural hearing loss, bilateral: Secondary | ICD-10-CM | POA: Insufficient documentation

## 2017-09-16 DIAGNOSIS — H9072 Mixed conductive and sensorineural hearing loss, unilateral, left ear, with unrestricted hearing on the contralateral side: Secondary | ICD-10-CM | POA: Diagnosis not present

## 2017-09-16 DIAGNOSIS — H7012 Chronic mastoiditis, left ear: Secondary | ICD-10-CM | POA: Diagnosis not present

## 2017-09-16 DIAGNOSIS — H90A31 Mixed conductive and sensorineural hearing loss, unilateral, right ear with restricted hearing on the contralateral side: Secondary | ICD-10-CM | POA: Insufficient documentation

## 2017-10-01 ENCOUNTER — Other Ambulatory Visit: Payer: Self-pay | Admitting: Family Medicine

## 2017-10-01 DIAGNOSIS — F410 Panic disorder [episodic paroxysmal anxiety] without agoraphobia: Secondary | ICD-10-CM

## 2017-10-22 ENCOUNTER — Other Ambulatory Visit: Payer: Self-pay | Admitting: Family Medicine

## 2017-10-22 DIAGNOSIS — M545 Low back pain, unspecified: Secondary | ICD-10-CM

## 2017-10-26 ENCOUNTER — Other Ambulatory Visit: Payer: Self-pay | Admitting: Cardiovascular Disease

## 2017-10-29 DIAGNOSIS — H7012 Chronic mastoiditis, left ear: Secondary | ICD-10-CM | POA: Diagnosis not present

## 2017-11-01 ENCOUNTER — Other Ambulatory Visit: Payer: Self-pay | Admitting: Family Medicine

## 2017-11-01 DIAGNOSIS — F410 Panic disorder [episodic paroxysmal anxiety] without agoraphobia: Secondary | ICD-10-CM

## 2017-11-25 ENCOUNTER — Other Ambulatory Visit: Payer: Self-pay | Admitting: Family Medicine

## 2017-11-26 ENCOUNTER — Other Ambulatory Visit: Payer: Self-pay | Admitting: Family Medicine

## 2017-11-26 DIAGNOSIS — M545 Low back pain, unspecified: Secondary | ICD-10-CM

## 2017-12-16 DIAGNOSIS — M5412 Radiculopathy, cervical region: Secondary | ICD-10-CM | POA: Diagnosis not present

## 2017-12-23 ENCOUNTER — Other Ambulatory Visit: Payer: Self-pay | Admitting: Cardiovascular Disease

## 2017-12-23 ENCOUNTER — Other Ambulatory Visit: Payer: Self-pay | Admitting: Family Medicine

## 2017-12-23 DIAGNOSIS — M545 Low back pain, unspecified: Secondary | ICD-10-CM

## 2017-12-23 DIAGNOSIS — F410 Panic disorder [episodic paroxysmal anxiety] without agoraphobia: Secondary | ICD-10-CM

## 2017-12-23 MED ORDER — METOPROLOL TARTRATE 25 MG PO TABS: 25.0000 mg | ORAL_TABLET | Freq: Two times a day (BID) | ORAL | 2 refills | Status: DC

## 2017-12-23 NOTE — Addendum Note (Signed)
Addended by: Valrie Hart on: 12/23/2017 03:24 PM   Modules accepted: Orders

## 2017-12-23 NOTE — Telephone Encounter (Signed)
Outpatient Medication Detail    Disp Refills Start End   metoprolol tartrate (LOPRESSOR) 25 MG tablet 180 tablet 2 12/23/2017    Sig - Route: Take 1 tablet (25 mg total) by mouth 2 (two) times daily. - Oral   Sent to pharmacy as: metoprolol tartrate (LOPRESSOR) 25 MG tablet   E-Prescribing Status: Receipt confirmed by pharmacy (12/23/2017 3:24 PM EDT)   Pharmacy   [facility] [id number] - [place], [place] - [address] AT [facility] [address]

## 2017-12-27 DIAGNOSIS — M47812 Spondylosis without myelopathy or radiculopathy, cervical region: Secondary | ICD-10-CM | POA: Diagnosis not present

## 2018-01-09 ENCOUNTER — Other Ambulatory Visit: Payer: Self-pay | Admitting: Family Medicine

## 2018-01-09 DIAGNOSIS — F410 Panic disorder [episodic paroxysmal anxiety] without agoraphobia: Secondary | ICD-10-CM

## 2018-01-13 DIAGNOSIS — M47812 Spondylosis without myelopathy or radiculopathy, cervical region: Secondary | ICD-10-CM | POA: Diagnosis not present

## 2018-01-17 ENCOUNTER — Other Ambulatory Visit: Payer: Self-pay | Admitting: Family Medicine

## 2018-01-17 DIAGNOSIS — F410 Panic disorder [episodic paroxysmal anxiety] without agoraphobia: Secondary | ICD-10-CM

## 2018-01-27 ENCOUNTER — Other Ambulatory Visit: Payer: Self-pay | Admitting: Family Medicine

## 2018-01-27 DIAGNOSIS — F410 Panic disorder [episodic paroxysmal anxiety] without agoraphobia: Secondary | ICD-10-CM

## 2018-02-02 DIAGNOSIS — M5412 Radiculopathy, cervical region: Secondary | ICD-10-CM | POA: Diagnosis not present

## 2018-02-02 DIAGNOSIS — M47812 Spondylosis without myelopathy or radiculopathy, cervical region: Secondary | ICD-10-CM | POA: Diagnosis not present

## 2018-02-23 ENCOUNTER — Other Ambulatory Visit: Payer: Self-pay | Admitting: Family Medicine

## 2018-02-23 DIAGNOSIS — M545 Low back pain, unspecified: Secondary | ICD-10-CM

## 2018-03-01 ENCOUNTER — Other Ambulatory Visit: Payer: Self-pay | Admitting: Family Medicine

## 2018-03-01 NOTE — Telephone Encounter (Signed)
Pt due for follow up please call and schedule appointment.  

## 2018-03-01 NOTE — Telephone Encounter (Signed)
Called pt scheduled for 03/09/18 @ 4pm

## 2018-03-09 ENCOUNTER — Ambulatory Visit: Payer: BLUE CROSS/BLUE SHIELD | Admitting: Family Medicine

## 2018-03-09 ENCOUNTER — Encounter (INDEPENDENT_AMBULATORY_CARE_PROVIDER_SITE_OTHER): Payer: Self-pay

## 2018-03-09 ENCOUNTER — Encounter: Payer: Self-pay | Admitting: Family Medicine

## 2018-03-09 VITALS — BP 128/86 | HR 75 | Temp 98.3°F | Ht 65.5 in | Wt 173.4 lb

## 2018-03-09 DIAGNOSIS — I10 Essential (primary) hypertension: Secondary | ICD-10-CM

## 2018-03-09 DIAGNOSIS — F411 Generalized anxiety disorder: Secondary | ICD-10-CM

## 2018-03-09 MED ORDER — VORTIOXETINE HBR 10 MG PO TABS: 10.0000 mg | ORAL_TABLET | Freq: Every day | ORAL | 2 refills | Status: DC

## 2018-03-09 NOTE — Patient Instructions (Addendum)
[facility] [address] [place], [place] [address] [phone]  [facility] [address] [place], [place] [address] [phone]  [facility] [address] [place], [place] [address] [phone]  [facility] [address], [place], [place], #[phone] [address], [place], [place], #[phone]#[phone]  [facility] [address], [place] [address] [phone]  [facility] [address] [place], [place] [phone]  [facility] [address] [place], [place] [phone]  Call one of these offices sooner than later as it can take 2-3 months to get a new patient appointment.   Let me know if new medication is too expensive and do not fill it.  Let us know if you need anything.

## 2018-03-09 NOTE — Progress Notes (Signed)
Chief Complaint  Patient presents with  . Follow-up    Subjective [name] presents for f/u anxiety/depression.  She is currently being treated with Celexa 20 mg/d.  S/s's are OK. She is getting stressed out from her pain clinic.  No thoughts of harming self or others. No self-medication with alcohol, prescription drugs or illicit drugs. Pt is not following with a counselor/psychologist.  Hypertension Patient presents for hypertension follow up. She does not routinely monitor home blood pressures. Blood pressures ranging on average from 120's/70-80's. She is compliant with medication- HCTZ 25 mg/d. Patient has these side effects of medication: none She is usually adhering to a healthy diet overall.   ROS Psych: No homicidal or suicidal thoughts  Past Medical History:  Diagnosis Date  . Back injury    Chronic  . Cardiomyopathy (HCC)    hypertension controlled-helped with edema-Dr Nahser  . Chest pain    Normal heart catherization  . Collapsed lung   . Deafness    Left ear  . Degenerative joint disease    with neck surgery  . Depression   . Diastolic dysfunction    with possible mild LVOT gradient  . Family history of adverse reaction to anesthesia    " my daughter has PONV and gets very grumpy"  . Fibromyalgia   . Former cigarette smoker   . Gestational diabetes   . H/O blood clots    due to side effect of medication  . Headache(784.0)    migraines  . Hypertension   . Leg cramps   . Neuropathy   . Pain    chronic   . Pneumonia   . Pneumothorax   . PTSD (post-traumatic stress disorder)   . Respiratory disorder      Exam BP 128/86 (BP Location: Left Arm, Patient Position: Sitting, Cuff Size: Normal)   Pulse 75   Temp 98.3 F (36.8 C) (Oral)   Ht 5' 5.5" (1.664 m)   Wt 173 lb 6 oz (78.6 kg)   SpO2 98%   BMI 28.41 kg/m  General:  well developed, well nourished, in no apparent distress Neck: neck supple without adenopathy, thyromegaly, or  masses Lungs:  clear to auscultation, breath sounds equal bilaterally, no respiratory distress Cardio:  regular rate and rhythm without murmurs, heart sounds without clicks or rubs Psych: well oriented with normal range of affect and age-appropriate judgement/insight, alert and oriented x4.  Assessment and Plan  Essential hypertension  GAD (generalized anxiety disorder) - Plan: vortioxetine HBr (TRINTELLIX) 10 MG TABS tablet  Cont HCTZ. Counseled on diet and exercise.  Stop Celexa, try Trintellix. Don't fill if too expensive. Resources for psychiatry given.  F/u in in 3 mo for CPE. The patient voiced understanding and agreement to the plan.  Jilda Rocheicholas Paul [place], DO 03/09/18 5:03 PM

## 2018-03-09 NOTE — Progress Notes (Signed)
Pre visit review using our clinic review tool, if applicable. No additional management support is needed unless otherwise documented below in the visit note. 

## 2018-03-29 ENCOUNTER — Other Ambulatory Visit: Payer: Self-pay | Admitting: Family Medicine

## 2018-03-29 DIAGNOSIS — M545 Low back pain, unspecified: Secondary | ICD-10-CM

## 2018-03-30 ENCOUNTER — Other Ambulatory Visit: Payer: Self-pay | Admitting: Family Medicine

## 2018-03-30 DIAGNOSIS — F410 Panic disorder [episodic paroxysmal anxiety] without agoraphobia: Secondary | ICD-10-CM

## 2018-04-27 ENCOUNTER — Other Ambulatory Visit: Payer: Self-pay | Admitting: Family Medicine

## 2018-04-27 DIAGNOSIS — M545 Low back pain, unspecified: Secondary | ICD-10-CM

## 2018-05-19 DIAGNOSIS — M5412 Radiculopathy, cervical region: Secondary | ICD-10-CM | POA: Diagnosis not present

## 2018-05-19 DIAGNOSIS — I10 Essential (primary) hypertension: Secondary | ICD-10-CM | POA: Diagnosis not present

## 2018-05-22 ENCOUNTER — Other Ambulatory Visit: Payer: Self-pay | Admitting: Family Medicine

## 2018-05-22 DIAGNOSIS — F410 Panic disorder [episodic paroxysmal anxiety] without agoraphobia: Secondary | ICD-10-CM

## 2018-06-01 DIAGNOSIS — M542 Cervicalgia: Secondary | ICD-10-CM | POA: Diagnosis not present

## 2018-06-01 DIAGNOSIS — M5412 Radiculopathy, cervical region: Secondary | ICD-10-CM | POA: Diagnosis not present

## 2018-06-01 DIAGNOSIS — M4802 Spinal stenosis, cervical region: Secondary | ICD-10-CM | POA: Diagnosis not present

## 2018-06-04 ENCOUNTER — Other Ambulatory Visit: Payer: Self-pay | Admitting: Family Medicine

## 2018-06-05 ENCOUNTER — Other Ambulatory Visit: Payer: Self-pay | Admitting: Family Medicine

## 2018-06-05 DIAGNOSIS — M545 Low back pain, unspecified: Secondary | ICD-10-CM

## 2018-06-06 ENCOUNTER — Other Ambulatory Visit: Payer: Self-pay | Admitting: Neurological Surgery

## 2018-06-07 ENCOUNTER — Other Ambulatory Visit: Payer: Self-pay | Admitting: Family Medicine

## 2018-06-07 DIAGNOSIS — F410 Panic disorder [episodic paroxysmal anxiety] without agoraphobia: Secondary | ICD-10-CM

## 2018-06-07 MED ORDER — HYDROXYZINE HCL 25 MG PO TABS: ORAL_TABLET | ORAL | 0 refills | Status: DC

## 2018-06-07 NOTE — Telephone Encounter (Signed)
Requested Prescriptions  Pending Prescriptions Disp Refills  . hydrOXYzine (ATARAX/VISTARIL) 25 MG tablet 90 tablet 0    Sig: TAKE 3 TO 4 TABLETS BY MOUTH EVERY 8 HOURS AS NEEDED FOR ANXIETY     Ear, Nose, and Throat:  Antihistamines Passed - 06/07/2018  1:41 PM      Passed - Valid encounter within last 12 months    Recent Outpatient Visits          3 months ago Essential hypertension   [facility] representative at [facility] [place], [place], [place]   1 year ago Acute otitis media of left ear with perforation   Holiday representative at [facility], [place], [place]   1 year ago Perforation of left tympanic membrane   Holiday representative at [facility], [place], [place]   1 year ago Chronic neck pain   [facility], Jilda Roche, DO   1 year ago Rash   Holiday representative at [facility], [place], [place]

## 2018-06-07 NOTE — Telephone Encounter (Signed)
Copied from CRM [id number]. Topic: Quick Communication - Rx Refill/Question >> Jun 07, 2018  1:34 PM Angela Nevin wrote: Medication: hydrOXYzine (ATARAX/VISTARIL) 25 MG tablet [[id number]]   Pt is requesting refill of this medication.   Preferred Pharmacy (with phone number or street name): [id number] - [place], [place] - [address] AT [facility] [address]

## 2018-06-09 NOTE — Pre-Procedure Instructions (Signed)
Mardene A Dimarco  06/09/2018      [facility] - [place], [place] - [address] [place] [address] Phone: [phone] Fax: [phone]  [id number] [place] - [address] AT [facility] [address] [place] [address] Phone: [phone] Fax: [phone]    Your procedure is scheduled on Oct. 21  Report to [facility] Admitting at 12:55  A.M.  Call this number if you have problems the morning of surgery:  [phone]   Remember:  Do not eat or drink after midnight.      Take these medicines the morning of surgery with A SIP OF WATER :              Citalopram (celexa)             Hydrocodone if needed             Hydroxyzine (atarax/vistaril) if needed             Metoprolol (lopressor)             Tizanidine (zanaflex)               7 days prior to surgery STOP taking any Aspirin(unless otherwise instructed by your surgeon), Aleve, Naproxen, Ibuprofen, Motrin, Advil, Goody's, BC's, all herbal medications, fish oil, and all vitamins                  Do not wear jewelry, make-up or nail polish.  Do not wear lotions, powders, or perfumes, or deodorant.  Do not shave 48 hours prior to surgery.  Men may shave face and neck.  Do not bring valuables to the hospital.  [facility] is not responsible for any belongings or valuables.  Contacts, dentures or bridgework may not be worn into surgery.  Leave your suitcase in the car.  After surgery it may be brought to your room.  For patients admitted to the hospital, discharge time will be determined by your treatment team.  Patients discharged the day of surgery will not be allowed to drive home.    Special instructions:  [place]- Preparing For Surgery  Before surgery, you can play an important role. Because skin is not sterile, your skin needs to be as free of germs as possible. You can reduce the number of germs on your skin by washing with  CHG (chlorahexidine gluconate) Soap before surgery.  CHG is an antiseptic cleaner which kills germs and bonds with the skin to continue killing germs even after washing.    Oral Hygiene is also important to reduce your risk of infection.  Remember - BRUSH YOUR TEETH THE MORNING OF SURGERY WITH YOUR REGULAR TOOTHPASTE  Please do not use if you have an allergy to CHG or antibacterial soaps. If your skin becomes reddened/irritated stop using the CHG.  Do not shave (including legs and underarms) for at least 48 hours prior to first CHG shower. It is OK to shave your face.  Please follow these instructions carefully.   1. Shower the NIGHT BEFORE SURGERY and the MORNING OF SURGERY with CHG.   2. If you chose to wash your hair, wash your hair first as usual with your normal shampoo.  3. After you shampoo, rinse your hair and body thoroughly to remove the shampoo.  4. Use CHG as you would any other liquid soap. You can apply CHG directly to  the skin and wash gently with a scrungie or a clean washcloth.   5. Apply the CHG Soap to your body ONLY FROM THE NECK DOWN.  Do not use on open wounds or open sores. Avoid contact with your eyes, ears, mouth and genitals (private parts). Wash Face and genitals (private parts)  with your normal soap.  6. Wash thoroughly, paying special attention to the area where your surgery will be performed.  7. Thoroughly rinse your body with warm water from the neck down.  8. DO NOT shower/wash with your normal soap after using and rinsing off the CHG Soap.  9. Pat yourself dry with a CLEAN TOWEL.  10. Wear CLEAN PAJAMAS to bed the night before surgery, wear comfortable clothes the morning of surgery  11. Place CLEAN SHEETS on your bed the night of your first shower and DO NOT SLEEP WITH PETS.    Day of Surgery:  Do not apply any deodorants/lotions.  Please wear clean clothes to the hospital/surgery center.   Remember to brush your teeth WITH YOUR REGULAR  TOOTHPASTE.    Please read over the following fact sheets that you were given. Coughing and Deep Breathing and Surgical Site Infection Prevention

## 2018-06-10 ENCOUNTER — Encounter (HOSPITAL_COMMUNITY)
Admission: RE | Admit: 2018-06-10 | Discharge: 2018-06-10 | Disposition: A | Discharge status: Home / Self Care | Payer: BLUE CROSS/BLUE SHIELD | Source: Ambulatory Visit | Attending: Neurological Surgery | Admitting: Neurological Surgery

## 2018-06-10 ENCOUNTER — Encounter (HOSPITAL_COMMUNITY): Payer: Self-pay

## 2018-06-10 ENCOUNTER — Telehealth: Payer: Self-pay | Admitting: Family Medicine

## 2018-06-10 ENCOUNTER — Other Ambulatory Visit: Payer: Self-pay

## 2018-06-10 ENCOUNTER — Encounter: Payer: BLUE CROSS/BLUE SHIELD | Admitting: Family Medicine

## 2018-06-10 DIAGNOSIS — Z01812 Encounter for preprocedural laboratory examination: Secondary | ICD-10-CM | POA: Diagnosis not present

## 2018-06-10 HISTORY — DX: Bronchitis, not specified as acute or chronic: J40

## 2018-06-10 HISTORY — DX: Cardiac arrhythmia, unspecified: I49.9

## 2018-06-10 LAB — BASIC METABOLIC PANEL
Anion gap: 17 — ABNORMAL HIGH (ref 5–15)
BUN: 9 mg/dL (ref 6–20)
CO2: 18 mmol/L — ABNORMAL LOW (ref 22–32)
Calcium: 10.4 mg/dL — ABNORMAL HIGH (ref 8.9–10.3)
Chloride: 95 mmol/L — ABNORMAL LOW (ref 98–111)
Creatinine, Ser: 0.7 mg/dL (ref 0.44–1.00)
GFR calc Af Amer: >60 mL/min (ref >60)
GFR calc non Af Amer: >60 mL/min (ref >60)
Glucose, Bld: 73 mg/dL (ref 70–99)
Potassium: 3.6 mmol/L (ref 3.5–5.1)
Sodium: 130 mmol/L — ABNORMAL LOW (ref 135–145)

## 2018-06-10 LAB — CBC
HCT: 47.6 % — ABNORMAL HIGH (ref 36.0–46.0)
Hemoglobin: 15.1 g/dL — ABNORMAL HIGH (ref 12.0–15.0)
MCH: 31 pg (ref 26.0–34.0)
MCHC: 31.7 g/dL (ref 30.0–36.0)
MCV: 97.7 fL (ref 80.0–100.0)
Platelets: 394 10*3/uL (ref 150–400)
RBC: 4.87 MIL/uL (ref 3.87–5.11)
RDW: 12.6 % (ref 11.5–15.5)
WBC: 9.2 10*3/uL (ref 4.0–10.5)
nRBC: 0 % (ref 0.0–0.2)

## 2018-06-10 LAB — SURGICAL PCR SCREEN
MRSA, PCR: NEGATIVE
Staphylococcus aureus: NEGATIVE

## 2018-06-10 MED ORDER — BUSPIRONE HCL 10 MG PO TABS: 10.0000 mg | ORAL_TABLET | Freq: Three times a day (TID) | ORAL | 0 refills | Status: DC

## 2018-06-10 NOTE — Progress Notes (Signed)
PCP - Arva Chafe MD Cardiologist - Dr. Melburn Popper   EKG - 09/07/17 ECHO - 2014 Cardiac Cath - doesn't remember what year  Blood Thinner Instructions: n/a Aspirin Instructions: n/a  Anesthesia review: yes. ekg review and cardiac history. Can't find documentation of heart cath  Patient denies shortness of breath, fever, cough and chest pain at PAT appointment   Patient verbalized understanding of instructions that were given to them at the PAT appointment. Patient was also instructed that they will need to review over the PAT instructions again at home before surgery.

## 2018-06-10 NOTE — Progress Notes (Signed)
Dr. Okey Dupre reviewed chart and stated "ok for surgery provided patient has been taking her metoprolol as prescribed".

## 2018-06-10 NOTE — Telephone Encounter (Signed)
Called the patient informed prescription sent in. 

## 2018-06-10 NOTE — Telephone Encounter (Signed)
Copied from CRM [id number]. Topic: Quick Communication - Rx Refill/Question >> Jun 10, 2018 11:16 AM Luanna Cole wrote: Medication: pt called and stated that she is having surgery on 06/13/18. Pt would like to know if she could be prescribed busPIRone (BUSPAR). For anxiety about surgery. Please advise

## 2018-06-10 NOTE — Telephone Encounter (Signed)
Ok

## 2018-06-13 ENCOUNTER — Encounter (HOSPITAL_COMMUNITY)
Admission: RE | Disposition: A | Discharge status: Home / Self Care | Payer: Self-pay | Source: Ambulatory Visit | Attending: Neurological Surgery

## 2018-06-13 ENCOUNTER — Encounter (HOSPITAL_COMMUNITY): Payer: Self-pay

## 2018-06-13 ENCOUNTER — Ambulatory Visit (HOSPITAL_COMMUNITY): Payer: BLUE CROSS/BLUE SHIELD | Admitting: Anesthesiology

## 2018-06-13 ENCOUNTER — Ambulatory Visit (HOSPITAL_COMMUNITY): Payer: BLUE CROSS/BLUE SHIELD

## 2018-06-13 ENCOUNTER — Other Ambulatory Visit: Payer: Self-pay

## 2018-06-13 ENCOUNTER — Observation Stay (HOSPITAL_COMMUNITY)
Admission: RE | Admit: 2018-06-13 | Discharge: 2018-06-14 | Disposition: A | Discharge status: Home / Self Care | Payer: BLUE CROSS/BLUE SHIELD | Source: Ambulatory Visit | Attending: Neurological Surgery | Admitting: Neurological Surgery

## 2018-06-13 DIAGNOSIS — M797 Fibromyalgia: Secondary | ICD-10-CM | POA: Diagnosis not present

## 2018-06-13 DIAGNOSIS — Z86718 Personal history of other venous thrombosis and embolism: Secondary | ICD-10-CM | POA: Diagnosis not present

## 2018-06-13 DIAGNOSIS — F4310 Post-traumatic stress disorder, unspecified: Secondary | ICD-10-CM | POA: Insufficient documentation

## 2018-06-13 DIAGNOSIS — I10 Essential (primary) hypertension: Secondary | ICD-10-CM | POA: Insufficient documentation

## 2018-06-13 DIAGNOSIS — M1990 Unspecified osteoarthritis, unspecified site: Secondary | ICD-10-CM | POA: Insufficient documentation

## 2018-06-13 DIAGNOSIS — Z8632 Personal history of gestational diabetes: Secondary | ICD-10-CM | POA: Diagnosis not present

## 2018-06-13 DIAGNOSIS — M4802 Spinal stenosis, cervical region: Principal | ICD-10-CM | POA: Insufficient documentation

## 2018-06-13 DIAGNOSIS — M5412 Radiculopathy, cervical region: Secondary | ICD-10-CM | POA: Diagnosis present

## 2018-06-13 DIAGNOSIS — F329 Major depressive disorder, single episode, unspecified: Secondary | ICD-10-CM | POA: Diagnosis not present

## 2018-06-13 DIAGNOSIS — Z90710 Acquired absence of both cervix and uterus: Secondary | ICD-10-CM | POA: Insufficient documentation

## 2018-06-13 DIAGNOSIS — Z833 Family history of diabetes mellitus: Secondary | ICD-10-CM | POA: Diagnosis not present

## 2018-06-13 DIAGNOSIS — Z9889 Other specified postprocedural states: Secondary | ICD-10-CM | POA: Insufficient documentation

## 2018-06-13 DIAGNOSIS — Z8249 Family history of ischemic heart disease and other diseases of the circulatory system: Secondary | ICD-10-CM | POA: Diagnosis not present

## 2018-06-13 DIAGNOSIS — Z981 Arthrodesis status: Secondary | ICD-10-CM | POA: Diagnosis not present

## 2018-06-13 DIAGNOSIS — I499 Cardiac arrhythmia, unspecified: Secondary | ICD-10-CM | POA: Insufficient documentation

## 2018-06-13 DIAGNOSIS — I429 Cardiomyopathy, unspecified: Secondary | ICD-10-CM | POA: Insufficient documentation

## 2018-06-13 DIAGNOSIS — Z888 Allergy status to other drugs, medicaments and biological substances status: Secondary | ICD-10-CM | POA: Diagnosis not present

## 2018-06-13 DIAGNOSIS — F17210 Nicotine dependence, cigarettes, uncomplicated: Secondary | ICD-10-CM | POA: Insufficient documentation

## 2018-06-13 DIAGNOSIS — Z803 Family history of malignant neoplasm of breast: Secondary | ICD-10-CM | POA: Diagnosis not present

## 2018-06-13 DIAGNOSIS — G629 Polyneuropathy, unspecified: Secondary | ICD-10-CM | POA: Diagnosis not present

## 2018-06-13 DIAGNOSIS — H9192 Unspecified hearing loss, left ear: Secondary | ICD-10-CM | POA: Diagnosis not present

## 2018-06-13 DIAGNOSIS — Z419 Encounter for procedure for purposes other than remedying health state, unspecified: Secondary | ICD-10-CM

## 2018-06-13 HISTORY — PX: POSTERIOR CERVICAL LAMINECTOMY WITH MET- RX: SHX6035

## 2018-06-13 SURGERY — POSTERIOR CERVICAL LAMINECTOMY WITH MET- RX
Anesthesia: General | Site: Back | Laterality: Left

## 2018-06-13 MED ORDER — FENTANYL CITRATE (PF) 250 MCG/5ML IJ SOLN
INTRAMUSCULAR | Status: AC
  Filled 2018-06-13: qty 5

## 2018-06-13 MED ORDER — MENTHOL 3 MG MT LOZG: 1.0000 | LOZENGE | OROMUCOSAL | Status: DC | PRN

## 2018-06-13 MED ORDER — SUGAMMADEX SODIUM 200 MG/2ML IV SOLN
INTRAVENOUS | Status: DC | PRN
  Administered 2018-06-13: 200 mg via INTRAVENOUS

## 2018-06-13 MED ORDER — FENTANYL CITRATE (PF) 250 MCG/5ML IJ SOLN
INTRAMUSCULAR | Status: DC | PRN
  Administered 2018-06-13: 100 ug via INTRAVENOUS
  Administered 2018-06-13 (×5): 50 ug via INTRAVENOUS

## 2018-06-13 MED ORDER — PHENOL 1.4 % MT LIQD: 1.0000 | OROMUCOSAL | Status: DC | PRN

## 2018-06-13 MED ORDER — TIZANIDINE HCL 4 MG PO TABS
4.0000 mg | ORAL_TABLET | Freq: Three times a day (TID) | ORAL | Status: DC | PRN
  Administered 2018-06-13 – 2018-06-14 (×2): 4 mg via ORAL
  Filled 2018-06-13 (×2): qty 1

## 2018-06-13 MED ORDER — METHOCARBAMOL 500 MG PO TABS
ORAL_TABLET | ORAL | Status: AC
  Filled 2018-06-13: qty 1

## 2018-06-13 MED ORDER — LACTATED RINGERS IV SOLN
INTRAVENOUS | Status: DC
  Administered 2018-06-13 (×2): via INTRAVENOUS

## 2018-06-13 MED ORDER — BUPIVACAINE HCL (PF) 0.5 % IJ SOLN
INTRAMUSCULAR | Status: DC | PRN
  Administered 2018-06-13: 11 mL

## 2018-06-13 MED ORDER — PROMETHAZINE HCL 25 MG/ML IJ SOLN: 6.2500 mg | INTRAMUSCULAR | Status: DC | PRN

## 2018-06-13 MED ORDER — ACETAMINOPHEN 650 MG RE SUPP: 650.0000 mg | RECTAL | Status: DC | PRN

## 2018-06-13 MED ORDER — PROPOFOL 10 MG/ML IV BOLUS
INTRAVENOUS | Status: AC
  Filled 2018-06-13: qty 20

## 2018-06-13 MED ORDER — CEFAZOLIN SODIUM-DEXTROSE 2-4 GM/100ML-% IV SOLN
2.0000 g | Freq: Three times a day (TID) | INTRAVENOUS | Status: AC
  Administered 2018-06-13 – 2018-06-14 (×2): 2 g via INTRAVENOUS
  Filled 2018-06-13 (×2): qty 100

## 2018-06-13 MED ORDER — METOPROLOL TARTRATE 25 MG PO TABS
25.0000 mg | ORAL_TABLET | Freq: Two times a day (BID) | ORAL | Status: DC
  Administered 2018-06-13 – 2018-06-14 (×2): 25 mg via ORAL
  Filled 2018-06-13 (×2): qty 1

## 2018-06-13 MED ORDER — OXYCODONE HCL 5 MG/5ML PO SOLN: 5.0000 mg | Freq: Once | ORAL | Status: AC | PRN

## 2018-06-13 MED ORDER — CITALOPRAM HYDROBROMIDE 20 MG PO TABS
20.0000 mg | ORAL_TABLET | Freq: Every day | ORAL | Status: DC
  Administered 2018-06-13 – 2018-06-14 (×2): 20 mg via ORAL
  Filled 2018-06-13 (×2): qty 1

## 2018-06-13 MED ORDER — SENNA 8.6 MG PO TABS
1.0000 | ORAL_TABLET | Freq: Two times a day (BID) | ORAL | Status: DC
  Administered 2018-06-13 – 2018-06-14 (×2): 8.6 mg via ORAL
  Filled 2018-06-13 (×2): qty 1

## 2018-06-13 MED ORDER — DOCUSATE SODIUM 100 MG PO CAPS
100.0000 mg | ORAL_CAPSULE | Freq: Two times a day (BID) | ORAL | Status: DC
  Administered 2018-06-13 – 2018-06-14 (×2): 100 mg via ORAL
  Filled 2018-06-13 (×2): qty 1

## 2018-06-13 MED ORDER — HYDROCHLOROTHIAZIDE 25 MG PO TABS
25.0000 mg | ORAL_TABLET | Freq: Every day | ORAL | Status: DC
  Administered 2018-06-13 – 2018-06-14 (×2): 25 mg via ORAL
  Filled 2018-06-13 (×2): qty 1

## 2018-06-13 MED ORDER — BUSPIRONE HCL 10 MG PO TABS
10.0000 mg | ORAL_TABLET | Freq: Three times a day (TID) | ORAL | Status: DC
  Administered 2018-06-13 – 2018-06-14 (×3): 10 mg via ORAL
  Filled 2018-06-13 (×4): qty 1

## 2018-06-13 MED ORDER — LIDOCAINE-EPINEPHRINE 1 %-1:100000 IJ SOLN
INTRAMUSCULAR | Status: DC | PRN
  Administered 2018-06-13: 11 mL

## 2018-06-13 MED ORDER — SODIUM CHLORIDE 0.9 % IV SOLN
INTRAVENOUS | Status: DC | PRN
  Administered 2018-06-13: 30 ug/min via INTRAVENOUS

## 2018-06-13 MED ORDER — SODIUM CHLORIDE 0.9 % IV SOLN: 250.0000 mL | INTRAVENOUS | Status: DC

## 2018-06-13 MED ORDER — DEXAMETHASONE SODIUM PHOSPHATE 10 MG/ML IJ SOLN
INTRAMUSCULAR | Status: DC | PRN
  Administered 2018-06-13: 10 mg via INTRAVENOUS

## 2018-06-13 MED ORDER — MAGNESIUM GLUCONATE 500 MG PO TABS
500.0000 mg | ORAL_TABLET | Freq: Every day | ORAL | Status: DC
  Administered 2018-06-13 – 2018-06-14 (×2): 500 mg via ORAL
  Filled 2018-06-13 (×2): qty 1

## 2018-06-13 MED ORDER — SODIUM CHLORIDE 0.9% FLUSH: 3.0000 mL | INTRAVENOUS | Status: DC | PRN

## 2018-06-13 MED ORDER — ONDANSETRON HCL 4 MG/2ML IJ SOLN: 4.0000 mg | Freq: Four times a day (QID) | INTRAMUSCULAR | Status: DC | PRN

## 2018-06-13 MED ORDER — PROPOFOL 10 MG/ML IV BOLUS
INTRAVENOUS | Status: DC | PRN
  Administered 2018-06-13: 150 mg via INTRAVENOUS
  Administered 2018-06-13: 50 mg via INTRAVENOUS

## 2018-06-13 MED ORDER — KETOROLAC TROMETHAMINE 15 MG/ML IJ SOLN
15.0000 mg | Freq: Four times a day (QID) | INTRAMUSCULAR | Status: DC
  Administered 2018-06-13 – 2018-06-14 (×3): 15 mg via INTRAVENOUS
  Filled 2018-06-13 (×3): qty 1

## 2018-06-13 MED ORDER — BACITRACIN ZINC 500 UNIT/GM EX OINT
TOPICAL_OINTMENT | CUTANEOUS | Status: AC
  Filled 2018-06-13: qty 28.35

## 2018-06-13 MED ORDER — HEMOSTATIC AGENTS (NO CHARGE) OPTIME
TOPICAL | Status: DC | PRN
  Administered 2018-06-13: 1 via TOPICAL

## 2018-06-13 MED ORDER — SODIUM CHLORIDE 0.9% FLUSH
3.0000 mL | Freq: Two times a day (BID) | INTRAVENOUS | Status: DC
  Administered 2018-06-13: 3 mL via INTRAVENOUS

## 2018-06-13 MED ORDER — THROMBIN 5000 UNITS EX SOLR
CUTANEOUS | Status: AC
  Filled 2018-06-13: qty 5000

## 2018-06-13 MED ORDER — METHOCARBAMOL 500 MG PO TABS
500.0000 mg | ORAL_TABLET | Freq: Four times a day (QID) | ORAL | Status: DC | PRN
  Administered 2018-06-13 – 2018-06-14 (×3): 500 mg via ORAL
  Filled 2018-06-13 (×2): qty 1

## 2018-06-13 MED ORDER — ONDANSETRON HCL 4 MG/2ML IJ SOLN
INTRAMUSCULAR | Status: DC | PRN
  Administered 2018-06-13: 4 mg via INTRAVENOUS

## 2018-06-13 MED ORDER — CEFAZOLIN SODIUM-DEXTROSE 2-4 GM/100ML-% IV SOLN
INTRAVENOUS | Status: AC
  Filled 2018-06-13: qty 100

## 2018-06-13 MED ORDER — HYDROMORPHONE HCL 1 MG/ML IJ SOLN
0.2500 mg | INTRAMUSCULAR | Status: DC | PRN
  Administered 2018-06-13: 0.5 mg via INTRAVENOUS

## 2018-06-13 MED ORDER — OXYCODONE HCL 5 MG PO TABS
ORAL_TABLET | ORAL | Status: AC
  Administered 2018-06-13: 5 mg via ORAL
  Filled 2018-06-13: qty 1

## 2018-06-13 MED ORDER — MIDAZOLAM HCL 2 MG/2ML IJ SOLN
INTRAMUSCULAR | Status: AC
  Filled 2018-06-13: qty 2

## 2018-06-13 MED ORDER — THROMBIN 5000 UNITS EX SOLR
OROMUCOSAL | Status: DC | PRN
  Administered 2018-06-13: 14:00:00 via TOPICAL

## 2018-06-13 MED ORDER — POLYETHYLENE GLYCOL 3350 17 G PO PACK: 17.0000 g | PACK | Freq: Every day | ORAL | Status: DC | PRN

## 2018-06-13 MED ORDER — CHLORHEXIDINE GLUCONATE CLOTH 2 % EX PADS: 6.0000 | MEDICATED_PAD | Freq: Once | CUTANEOUS | Status: DC

## 2018-06-13 MED ORDER — BISACODYL 10 MG RE SUPP: 10.0000 mg | Freq: Every day | RECTAL | Status: DC | PRN

## 2018-06-13 MED ORDER — MIDAZOLAM HCL 5 MG/5ML IJ SOLN
INTRAMUSCULAR | Status: DC | PRN
  Administered 2018-06-13: 2 mg via INTRAVENOUS

## 2018-06-13 MED ORDER — LORATADINE 10 MG PO TABS: 10.0000 mg | ORAL_TABLET | Freq: Every day | ORAL | Status: DC | PRN

## 2018-06-13 MED ORDER — ONDANSETRON HCL 4 MG PO TABS: 4.0000 mg | ORAL_TABLET | Freq: Four times a day (QID) | ORAL | Status: DC | PRN

## 2018-06-13 MED ORDER — OXYCODONE HCL 5 MG PO TABS
5.0000 mg | ORAL_TABLET | Freq: Once | ORAL | Status: AC | PRN
  Administered 2018-06-13: 5 mg via ORAL

## 2018-06-13 MED ORDER — ACETAMINOPHEN 325 MG PO TABS: 650.0000 mg | ORAL_TABLET | ORAL | Status: DC | PRN

## 2018-06-13 MED ORDER — BUPIVACAINE HCL (PF) 0.5 % IJ SOLN
INTRAMUSCULAR | Status: AC
  Filled 2018-06-13: qty 30

## 2018-06-13 MED ORDER — MORPHINE SULFATE (PF) 2 MG/ML IV SOLN
2.0000 mg | INTRAVENOUS | Status: DC | PRN
  Administered 2018-06-13 (×2): 2 mg via INTRAVENOUS
  Filled 2018-06-13 (×2): qty 1

## 2018-06-13 MED ORDER — THROMBIN 5000 UNITS EX SOLR
CUTANEOUS | Status: AC
  Filled 2018-06-13: qty 10000

## 2018-06-13 MED ORDER — THROMBIN 5000 UNITS EX SOLR
CUTANEOUS | Status: DC | PRN
  Administered 2018-06-13 (×2): 5000 [IU] via TOPICAL

## 2018-06-13 MED ORDER — ACETAMINOPHEN 500 MG PO TABS
1000.0000 mg | ORAL_TABLET | Freq: Four times a day (QID) | ORAL | Status: DC | PRN
  Administered 2018-06-14: 1000 mg via ORAL
  Filled 2018-06-13: qty 2

## 2018-06-13 MED ORDER — METHOCARBAMOL 1000 MG/10ML IJ SOLN
500.0000 mg | Freq: Four times a day (QID) | INTRAVENOUS | Status: DC | PRN
  Filled 2018-06-13: qty 5

## 2018-06-13 MED ORDER — CEFAZOLIN SODIUM-DEXTROSE 2-4 GM/100ML-% IV SOLN
2.0000 g | INTRAVENOUS | Status: AC
  Administered 2018-06-13: 2 g via INTRAVENOUS

## 2018-06-13 MED ORDER — ROCURONIUM BROMIDE 10 MG/ML (PF) SYRINGE
PREFILLED_SYRINGE | INTRAVENOUS | Status: DC | PRN
  Administered 2018-06-13 (×2): 10 mg via INTRAVENOUS
  Administered 2018-06-13: 50 mg via INTRAVENOUS

## 2018-06-13 MED ORDER — FLEET ENEMA 7-19 GM/118ML RE ENEM: 1.0000 | ENEMA | Freq: Once | RECTAL | Status: DC | PRN

## 2018-06-13 MED ORDER — HYDROCODONE-ACETAMINOPHEN 7.5-325 MG PO TABS
1.0000 | ORAL_TABLET | Freq: Four times a day (QID) | ORAL | Status: DC | PRN
  Administered 2018-06-13 – 2018-06-14 (×3): 1 via ORAL
  Filled 2018-06-13 (×4): qty 1

## 2018-06-13 MED ORDER — MEPERIDINE HCL 50 MG/ML IJ SOLN: 6.2500 mg | INTRAMUSCULAR | Status: DC | PRN

## 2018-06-13 MED ORDER — ROCURONIUM BROMIDE 50 MG/5ML IV SOSY
PREFILLED_SYRINGE | INTRAVENOUS | Status: AC
  Filled 2018-06-13: qty 5

## 2018-06-13 MED ORDER — LIDOCAINE-EPINEPHRINE 1 %-1:100000 IJ SOLN
INTRAMUSCULAR | Status: AC
  Filled 2018-06-13: qty 1

## 2018-06-13 MED ORDER — SODIUM CHLORIDE 0.9 % IV SOLN
INTRAVENOUS | Status: DC | PRN
  Administered 2018-06-13: 14:00:00

## 2018-06-13 MED ORDER — BISACODYL 5 MG PO TBEC
10.0000 mg | DELAYED_RELEASE_TABLET | Freq: Every day | ORAL | Status: DC
  Administered 2018-06-13 – 2018-06-14 (×2): 10 mg via ORAL
  Filled 2018-06-13 (×2): qty 2

## 2018-06-13 MED ORDER — HYDROMORPHONE HCL 1 MG/ML IJ SOLN
INTRAMUSCULAR | Status: AC
  Administered 2018-06-13: 0.5 mg via INTRAVENOUS
  Filled 2018-06-13: qty 1

## 2018-06-13 SURGICAL SUPPLY — 58 items
ADH SKN CLS APL DERMABOND .7 (GAUZE/BANDAGES/DRESSINGS) ×1
BAG DECANTER FOR FLEXI CONT (MISCELLANEOUS) ×3 IMPLANT
BLADE CLIPPER SURG (BLADE) IMPLANT
BLADE SURG 15 STRL LF DISP TIS (BLADE) ×1 IMPLANT
BLADE SURG 15 STRL SS (BLADE) ×3
BUR 2.5 MTCH HD 16 (BUR) ×2 IMPLANT
BUR 2.5MM MTCH HD 16CM (BUR) ×1
CANISTER SUCT 3000ML PPV (MISCELLANEOUS) ×3 IMPLANT
CARTRIDGE OIL MAESTRO DRILL (MISCELLANEOUS) ×1 IMPLANT
COVER WAND RF STERILE (DRAPES) ×3 IMPLANT
DECANTER SPIKE VIAL GLASS SM (MISCELLANEOUS) ×3 IMPLANT
DERMABOND ADVANCED (GAUZE/BANDAGES/DRESSINGS) ×2
DERMABOND ADVANCED .7 DNX12 (GAUZE/BANDAGES/DRESSINGS) ×2 IMPLANT
DIFFUSER DRILL AIR PNEUMATIC (MISCELLANEOUS) ×3 IMPLANT
DRAPE C-ARM 42X72 X-RAY (DRAPES) ×4 IMPLANT
DRAPE LAPAROTOMY 100X72 PEDS (DRAPES) ×3 IMPLANT
DRAPE MICROSCOPE LEICA (MISCELLANEOUS) ×3 IMPLANT
DRAPE POUCH INSTRU U-SHP 10X18 (DRAPES) ×3 IMPLANT
DURAPREP 6ML APPLICATOR 50/CS (WOUND CARE) ×3 IMPLANT
ELECT BLADE 6.5 EXT (BLADE) ×3 IMPLANT
ELECT REM PT RETURN 9FT ADLT (ELECTROSURGICAL) ×3
ELECTRODE REM PT RTRN 9FT ADLT (ELECTROSURGICAL) ×1 IMPLANT
GAUZE 4X4 16PLY RFD (DISPOSABLE) IMPLANT
GLOVE BIO SURGEON STRL SZ 6.5 (GLOVE) ×1 IMPLANT
GLOVE BIO SURGEON STRL SZ8 (GLOVE) ×2 IMPLANT
GLOVE BIO SURGEON STRL SZ8.5 (GLOVE) ×2 IMPLANT
GLOVE BIO SURGEONS STRL SZ 6.5 (GLOVE) ×1
GLOVE BIOGEL PI IND STRL 8.5 (GLOVE) ×1 IMPLANT
GLOVE BIOGEL PI INDICATOR 8.5 (GLOVE) ×2
GLOVE ECLIPSE 8.5 STRL (GLOVE) ×3 IMPLANT
GLOVE EXAM NITRILE LRG STRL (GLOVE) IMPLANT
GLOVE EXAM NITRILE XL STR (GLOVE) IMPLANT
GLOVE EXAM NITRILE XS STR PU (GLOVE) IMPLANT
GLOVE SURG SS PI 7.0 STRL IVOR (GLOVE) ×6 IMPLANT
GOWN STRL REUS W/ TWL LRG LVL3 (GOWN DISPOSABLE) IMPLANT
GOWN STRL REUS W/ TWL XL LVL3 (GOWN DISPOSABLE) IMPLANT
GOWN STRL REUS W/TWL 2XL LVL3 (GOWN DISPOSABLE) ×3 IMPLANT
GOWN STRL REUS W/TWL LRG LVL3 (GOWN DISPOSABLE) ×3
GOWN STRL REUS W/TWL XL LVL3 (GOWN DISPOSABLE) ×3
HEMOSTAT POWDER SURGIFOAM 1G (HEMOSTASIS) ×2 IMPLANT
KIT BASIN OR (CUSTOM PROCEDURE TRAY) ×3 IMPLANT
KIT TURNOVER KIT B (KITS) ×3 IMPLANT
NDL HYPO 18GX1.5 BLUNT FILL (NEEDLE) IMPLANT
NDL SPNL 20GX3.5 QUINCKE YW (NEEDLE) IMPLANT
NEEDLE HYPO 18GX1.5 BLUNT FILL (NEEDLE) IMPLANT
NEEDLE HYPO 22GX1.5 SAFETY (NEEDLE) ×3 IMPLANT
NEEDLE SPNL 20GX3.5 QUINCKE YW (NEEDLE) IMPLANT
NS IRRIG 1000ML POUR BTL (IV SOLUTION) ×3 IMPLANT
OIL CARTRIDGE MAESTRO DRILL (MISCELLANEOUS) ×3
PACK LAMINECTOMY NEURO (CUSTOM PROCEDURE TRAY) ×3 IMPLANT
PAD ARMBOARD 7.5X6 YLW CONV (MISCELLANEOUS) ×9 IMPLANT
RUBBERBAND STERILE (MISCELLANEOUS) IMPLANT
SPONGE SURGIFOAM ABS GEL SZ50 (HEMOSTASIS) ×3 IMPLANT
SUT VIC AB 3-0 SH 8-18 (SUTURE) ×3 IMPLANT
SYR 5ML LL (SYRINGE) IMPLANT
TOWEL GREEN STERILE (TOWEL DISPOSABLE) ×3 IMPLANT
TOWEL GREEN STERILE FF (TOWEL DISPOSABLE) ×3 IMPLANT
WATER STERILE IRR 1000ML POUR (IV SOLUTION) ×3 IMPLANT

## 2018-06-13 NOTE — H&P (Signed)
[name] is an 57 y.o. female.   Chief Complaint: Left shoulder and arm pain HPI: [name] is a 57 year old individual whose had significant cervical spondylosis in the past.  She has had a decompression and fusion from C4-C6.  She has had evidence of left-sided cervical radiculopathy in the C3-4 distribution in addition to right-sided radiculopathy at the time she underwent anterior decompression and arthroplasty at C3-4 a couple of years ago.  Unfortunately she has had persistent cervical radiculopathy and a recent myelogram demonstrates that she has marked foraminal stenosis on the left side at C3-C4.  She is now admitted to undergo surgical decompression at C3-C4.  On the left  Past Medical History:  Diagnosis Date  . Back injury    Chronic  . Bronchitis   . Cardiomyopathy (HCC)    hypertension controlled-helped with edema-Dr Nahser  . Chest pain    Normal heart catherization  . Collapsed lung   . Deafness    Left ear  . Degenerative joint disease    with neck surgery  . Depression   . Diastolic dysfunction    with possible mild LVOT gradient  . Dysrhythmia   . Family history of adverse reaction to anesthesia    " my daughter has PONV and gets very grumpy"  . Fibromyalgia   . Former cigarette smoker   . Gestational diabetes   . H/O blood clots    due to side effect of medication  . Headache(784.0)    migraines  . Hypertension   . Leg cramps   . Neuropathy   . Pain    chronic   . Pneumonia   . Pneumothorax   . PTSD (post-traumatic stress disorder)   . Respiratory disorder     Past Surgical History:  Procedure Laterality Date  . ABDOMINAL HYSTERECTOMY     partial  . CARDIAC CATHETERIZATION     Ejection Fraction 65-70%  . CERVICAL DISC ARTHROPLASTY N/A 02/21/2016   Procedure: Cervical three-four artificial disc replacement;  Surgeon: Barnett Abu, MD;  Location: [facility];  Service: Neurosurgery;  Laterality: N/A;  . CERVICAL FUSION     C5-6  .  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 HERNIA REPAIR     " as a baby"  . NECK SURGERY    . OTHER SURGICAL HISTORY     History of cervical and lumbar disk surgery  . polypectomies     tubes  . TONSILLECTOMY AND ADENOIDECTOMY      Family History  Problem Relation Age of Onset  . Coronary artery disease Father   . Parkinson's disease Mother   . Cancer Sister        breast  . Diabetes Sister   . Diabetes Brother    Social History:  reports that she has been smoking cigarettes. She has a 25.00 pack-year smoking history. She has never used smokeless tobacco. She reports that she drinks alcohol. She reports that she does not use drugs.  Allergies:  Allergies  Allergen Reactions  . Lyrica [Pregabalin] Rash  . Neurontin [Gabapentin] Other (See Comments)    Sleepy all the time    No medications prior to admission.    No results found for this or any previous visit (from the past 48 hour(s)). No results found.  Review of Systems  Constitutional: Negative.   HENT: Negative.   Eyes: Negative.   Respiratory: Negative.   Cardiovascular: Negative.  Gastrointestinal: Negative.   Genitourinary: Negative.   Musculoskeletal: Positive for neck pain.  Skin: Negative.   Neurological: Positive for headaches.  Endo/Heme/Allergies: Negative.   Psychiatric/Behavioral: Negative.     Weight 78.6 kg. Physical Exam  Constitutional: She is oriented to person, place, and time. She appears well-developed and well-nourished.  HENT:  Head: Normocephalic and atraumatic.  Eyes: Pupils are equal, round, and reactive to light. Conjunctivae and EOM are normal.  Neck:  Decreased range of motion turning 45 degrees left and right flexing and extending 80% of normal.  Axial compression does not reproduce any pain.  Cardiovascular: Normal rate and regular rhythm.  Respiratory: Effort normal and breath sounds normal.  GI: Soft. Bowel sounds are normal.  Musculoskeletal:   Lower extremity range of motion and strength is intact  Neurological: She is alert and oriented to person, place, and time.  Strength in the deltoids biceps triceps grips and intrinsics is intact.  Renal nerve examination is normal.  Skin: Skin is warm and dry.  Psychiatric: She has a normal mood and affect. Her behavior is normal. Judgment and thought content normal.     Assessment/Plan Spondylosis C3-C4 status post arthroplasty C3-4 left.  Plan: Metrix decompression C3-4 left  Stefani Dama, MD 06/13/2018, 7:39 AM

## 2018-06-13 NOTE — Transfer of Care (Signed)
Immediate Anesthesia Transfer of Care Note  Patient: [name]  Procedure(s) Performed: Left Cervical three-four Laminotomy/foraminotomy (Left Back)  Patient Location: PACU  Anesthesia Type:General  Level of Consciousness: awake, alert  and oriented  Airway & Oxygen Therapy: Patient Spontanous Breathing  Post-op Assessment: Report given to RN and Post -op Vital signs reviewed and stable  Post vital signs: Reviewed and stable  Last Vitals:  Vitals Value Taken Time  BP 154/95 06/13/2018  3:33 PM  Temp    Pulse 98 06/13/2018  3:36 PM  Resp 15 06/13/2018  3:36 PM  SpO2 100 % 06/13/2018  3:36 PM  Vitals shown include unvalidated device data.  Last Pain:  Vitals:   06/13/18 1147  TempSrc:   PainSc: 8       Patients Stated Pain Goal: 3 (06/13/18 1147)  Complications: No apparent anesthesia complications

## 2018-06-13 NOTE — Anesthesia Preprocedure Evaluation (Signed)
Anesthesia Evaluation  Patient identified by MRN, date of birth, ID band Patient awake    Reviewed: Allergy & Precautions, NPO status , Patient's Chart, lab work & pertinent test results  Airway Mallampati: II  TM Distance: >3 FB Neck ROM: Full    Dental no notable dental hx. (+) Caps   Pulmonary Current Smoker,  H/o ptx   Pulmonary exam normal breath sounds clear to auscultation       Cardiovascular hypertension, Pt. on medications and Pt. on home beta blockers Normal cardiovascular exam Rhythm:Regular Rate:Normal     Neuro/Psych  Headaches, Anxiety Depression negative psych ROS   GI/Hepatic negative GI ROS, Neg liver ROS,   Endo/Other    Renal/GU negative Renal ROS  negative genitourinary   Musculoskeletal  (+) Arthritis , Osteoarthritis,  Fibromyalgia -  Abdominal   Peds negative pediatric ROS (+)  Hematology negative hematology ROS (+)   Anesthesia Other Findings   Reproductive/Obstetrics negative OB ROS                             Anesthesia Physical  Anesthesia Plan  ASA: II  Anesthesia Plan: General   Post-op Pain Management:    Induction: Intravenous  PONV Risk Score and Plan: 2 and Ondansetron and Midazolam  Airway Management Planned: Oral ETT  Additional Equipment:   Intra-op Plan:   Post-operative Plan: Extubation in OR  Informed Consent: I have reviewed the patients History and Physical, chart, labs and discussed the procedure including the risks, benefits and alternatives for the proposed anesthesia with the patient or authorized representative who has indicated his/her understanding and acceptance.   Dental advisory given  Plan Discussed with: CRNA  Anesthesia Plan Comments:         Anesthesia Quick Evaluation

## 2018-06-13 NOTE — Anesthesia Procedure Notes (Signed)
Procedure Name: Intubation Date/Time: 06/13/2018 1:55 PM Performed by: Hart Robinsons, CRNA Pre-anesthesia Checklist: Patient identified, Emergency Drugs available, Suction available and Patient being monitored Patient Re-evaluated:Patient Re-evaluated prior to induction Oxygen Delivery Method: Circle System Utilized Preoxygenation: Pre-oxygenation with 100% oxygen Induction Type: IV induction Ventilation: Mask ventilation without difficulty Laryngoscope Size: Glidescope and 3 Grade View: Grade I Tube type: Oral Tube size: 7.5 mm Number of attempts: 1 Airway Equipment and Method: Stylet and Oral airway Placement Confirmation: ETT inserted through vocal cords under direct vision,  positive ETCO2 and breath sounds checked- equal and bilateral Secured at: 22 cm Tube secured with: Tape Dental Injury: Teeth and Oropharynx as per pre-operative assessment  Comments: Cspine neutrality maintained, no change in dentition from pre-procedure.

## 2018-06-13 NOTE — Op Note (Signed)
Date of surgery: 06/13/2018 Preoperative diagnosis: C3-4 left cervical radiculopathy status post cervical arthroplasty Postoperative diagnosis: Same Procedure: Cervical laminotomy and foraminotomies C3-4 left with operating microscope and Metrix retractor system Fluoroscopic imaging Surgeon: Barnett Abu First assistant: Tressie Stalker, MD Anesthesia: General endotracheal Indications: [name] is a 57 year old individual who underwent an anterior cervical decompression and arthroplasty at C3-4 little over a year and a half ago.  She has had persistent left cervical radiculopathy in the C4 distribution and recent myelogram and MRI demonstrate that she has foraminal stenosis on the left side at C3-4.  She is been advised regarding surgical decompression via laminotomy and foraminotomies.  Procedure: Patient was brought to the operating room supine on the stretcher.  After the smooth induction of general endotracheal anesthesia, she was turned prone.  The back of the neck was prepped with alcohol and DuraPrep and draped in sterile fashion.  C3-4 was localized using fluoroscopy on the left side.  The skin was infiltrated with 10 cc of lidocaine with epinephrine mixed 50-50 with half percent Marcaine.  A small vertical incision was made over the midportion of the area directly over C3-4.  A K wire was placed at the facet capsular space at C3-4 on the left.  Then a series of dilators was passed over the K wire to dilate this to an 18 mm diameter.  An 18 mm x 5 cm deep Metrix tube retractor was then placed in this region and fastened to the operating table with a clamp.  Through this aperture we observe the field with the microscope and the operating microscope was used to clear the soft tissues in this area and allow good visualization of the C3-C4 facet joint complex at the border with the laminar arch.  Then a high-speed drill with a 1.2 mm cutting tool was used to remove the inferior margin lamina and  medial wall of the facet of the C3-C4.  The area was thinned until the ligament could be lifted and then using a 2 mm Kerrison punch ligament was lifted from this area along with enlarging the laminotomy site.  The soft tissues in this area were thickened and redundant and adherent to the dura these were carefully dissected free until ultimately the path of the C4 nerve root could be identified.  This was then decompressed removing bone from the superior articular process of C4 to allow easy egress of the C4 nerve root into the foramen.  Ultimately was felt that the C4 nerve root was decompressed well into the foramen beyond the superior articular process.  This could easily be palpated as open.  Hemostasis in the soft tissues was then meticulously obtained and care was taken to make sure that the common dural tube the shoulder of the C4 nerve root in addition to the axilla was well decompressed also.  Once this was verified and hemostasis was obtained the Metrix retractor cannula was removed.  The subcutaneous tissue was closed with 3-0 Vicryl and ultimately the subcuticular skin was closed with 3-0 Vicryl also Dermabond was placed on the skin.  Blood loss was estimated less than 50 cc.  Patient was returned to the recovery room in stable condition.

## 2018-06-13 NOTE — Plan of Care (Signed)
  Problem: Education: Goal: Ability to verbalize activity precautions or restrictions will improve Outcome: Progressing Goal: Knowledge of the prescribed therapeutic regimen will improve Outcome: Progressing Goal: Understanding of discharge needs will improve Outcome: Progressing   

## 2018-06-14 ENCOUNTER — Encounter (HOSPITAL_COMMUNITY): Payer: Self-pay | Admitting: Neurological Surgery

## 2018-06-14 DIAGNOSIS — Z9889 Other specified postprocedural states: Secondary | ICD-10-CM | POA: Diagnosis not present

## 2018-06-14 DIAGNOSIS — G629 Polyneuropathy, unspecified: Secondary | ICD-10-CM | POA: Diagnosis not present

## 2018-06-14 DIAGNOSIS — F4310 Post-traumatic stress disorder, unspecified: Secondary | ICD-10-CM | POA: Diagnosis not present

## 2018-06-14 DIAGNOSIS — M4802 Spinal stenosis, cervical region: Secondary | ICD-10-CM | POA: Diagnosis not present

## 2018-06-14 DIAGNOSIS — I499 Cardiac arrhythmia, unspecified: Secondary | ICD-10-CM | POA: Diagnosis not present

## 2018-06-14 DIAGNOSIS — M797 Fibromyalgia: Secondary | ICD-10-CM | POA: Diagnosis not present

## 2018-06-14 DIAGNOSIS — I10 Essential (primary) hypertension: Secondary | ICD-10-CM | POA: Diagnosis not present

## 2018-06-14 DIAGNOSIS — M5412 Radiculopathy, cervical region: Secondary | ICD-10-CM | POA: Diagnosis not present

## 2018-06-14 DIAGNOSIS — F17210 Nicotine dependence, cigarettes, uncomplicated: Secondary | ICD-10-CM | POA: Diagnosis not present

## 2018-06-14 DIAGNOSIS — I429 Cardiomyopathy, unspecified: Secondary | ICD-10-CM | POA: Diagnosis not present

## 2018-06-14 DIAGNOSIS — Z90710 Acquired absence of both cervix and uterus: Secondary | ICD-10-CM | POA: Diagnosis not present

## 2018-06-14 DIAGNOSIS — Z981 Arthrodesis status: Secondary | ICD-10-CM | POA: Diagnosis not present

## 2018-06-14 DIAGNOSIS — Z86718 Personal history of other venous thrombosis and embolism: Secondary | ICD-10-CM | POA: Diagnosis not present

## 2018-06-14 DIAGNOSIS — H9192 Unspecified hearing loss, left ear: Secondary | ICD-10-CM | POA: Diagnosis not present

## 2018-06-14 DIAGNOSIS — Z8632 Personal history of gestational diabetes: Secondary | ICD-10-CM | POA: Diagnosis not present

## 2018-06-14 DIAGNOSIS — F329 Major depressive disorder, single episode, unspecified: Secondary | ICD-10-CM | POA: Diagnosis not present

## 2018-06-14 MED ORDER — HYDROCODONE-ACETAMINOPHEN 7.5-325 MG PO TABS: 1.0000 | ORAL_TABLET | Freq: Four times a day (QID) | ORAL | 0 refills | Status: DC | PRN

## 2018-06-14 MED ORDER — TIZANIDINE HCL 6 MG PO CAPS: 6.0000 mg | ORAL_CAPSULE | Freq: Three times a day (TID) | ORAL | 3 refills | Status: DC | PRN

## 2018-06-14 MED ORDER — DIAZEPAM 5 MG PO TABS: 5.0000 mg | ORAL_TABLET | Freq: Four times a day (QID) | ORAL | 0 refills | Status: DC | PRN

## 2018-06-14 NOTE — Evaluation (Signed)
Occupational Therapy Evaluation Patient Details Name: [name] MRN: [id number] DOB: 1961-02-12 Today's Date: 06/14/2018    History of Present Illness Pt is a 57 y/o female now s/p C3-4 laminotomy/foraminotomies. Pt with PMHx including deafness (L ear), cardiomyopathy, fibromyalgia, PTSD, depression, neuropathy, hx of previous C5-6 fusion, cardiac catheterization    Clinical Impression   This 57 y/o female presents with the above. At baseline pt is independent with ADLs and functional mobility. Pt demonstrating functional mobility without AD and overall minguard-supervision this session. She currently requires supervision for toileting and UB ADLs, minguard assist for LB ADLs. Reviewed and educated pt on cervical precautions, safety and compensatory techniques for completing ADLs and functional transfers with pt verbalizing and return demonstrating understanding, questions answered throughout; pt requiring min cues for adhering to precautions this session. Pt will return home with spouse who pt reports is able to assist with ADLs PRN. No further acute OT needs identified at this time, do not anticipate pt will require follow up OT services, acute OT to sign off. Thank you for this referral.     Follow Up Recommendations  No OT follow up;Supervision/Assistance - 24 hour(24hr initially)    Equipment Recommendations  None recommended by OT           Precautions / Restrictions Precautions Precautions: Cervical Precaution Booklet Issued: Yes (comment) Precaution Comments: issued and reviewed with pt Required Braces or Orthoses: (per MD order set, "no brace needed") Restrictions Weight Bearing Restrictions: No      Mobility Bed Mobility               General bed mobility comments: OOB in recliner upon arrival, verbally reviewed log roll technique  Transfers Overall transfer level: Needs assistance Equipment used: None Transfers: Sit to/from Stand Sit to Stand:  Supervision;Min guard         General transfer comment: for general safety and immediate standing balance, no physical assist required    Balance Overall balance assessment: No apparent balance deficits (not formally assessed)                                         ADL either performed or assessed with clinical judgement   ADL Overall ADL's : Needs assistance/impaired Eating/Feeding: Modified independent;Sitting   Grooming: Wash/dry hands;Supervision/safety;Standing   Upper Body Bathing: Supervision/ safety;Sitting;Standing Upper Body Bathing Details (indicate cue type and reason): educated on having spouse present during bathing task initially if completing in standing  Lower Body Bathing: Min guard;Sit to/from stand   Upper Body Dressing : Min guard;Sitting Upper Body Dressing Details (indicate cue type and reason): donning overhead shirt Lower Body Dressing: Min guard;Sit to/from stand Lower Body Dressing Details (indicate cue type and reason): donning pants and underwear, min cues for cervical precautions Toilet Transfer: Min guard;Ambulation;Regular Toilet;Grab bars   Toileting- Clothing Manipulation and Hygiene: Supervision/safety;Sit to/from Nurse, children's Details (indicate cue type and reason): verbally reviewed safe transfer technique, recommend have spouse present initially during transfer for increased safety Functional mobility during ADLs: Min guard General ADL Comments: reviewed and educated pt on cervical precautions, safety and compensatory techniques for completing ADLs and functioanl transfers; pt requiring min cues for adhering to precautions this session     Vision         Perception     Praxis      Pertinent Vitals/Pain Pain Assessment: 0-10  Pain Score: 7  Pain Location: neck at incision site Pain Descriptors / Indicators: Discomfort;Sore Pain Intervention(s): Monitored during session;RN gave pain meds during  session     Hand Dominance     Extremity/Trunk Assessment Upper Extremity Assessment Upper Extremity Assessment: Overall WFL for tasks assessed   Lower Extremity Assessment Lower Extremity Assessment: Defer to PT evaluation   Cervical / Trunk Assessment Cervical / Trunk Assessment: Other exceptions Cervical / Trunk Exceptions: s/p cervical sx   Communication Communication Communication: No difficulties;Deaf(pt reports deaf in L ear)   Cognition Arousal/Alertness: Awake/alert Behavior During Therapy: WFL for tasks assessed/performed Overall Cognitive Status: Within Functional Limits for tasks assessed                                     General Comments       Exercises     Shoulder Instructions      Home Living Family/patient expects to be discharged to:: Private residence Living Arrangements: Spouse/significant other Available Help at Discharge: Family;Available 24 hours/day(spouse home 1st week ) Type of Home: House Home Access: Stairs to enter [employer] of Steps: 2 Entrance Stairs-Rails: None Home Layout: One level     Bathroom Shower/Tub: Chief Strategy Officer: Standard     Home Equipment: Grab bars - toilet          Prior Functioning/Environment Level of Independence: Independent                 OT Problem List: Decreased range of motion;Decreased activity tolerance;Decreased knowledge of precautions      OT Treatment/Interventions:      OT Goals(Current goals can be found in the care plan section) Acute Rehab OT Goals Patient Stated Goal: hopeful for home today OT Goal Formulation: All assessment and education complete, DC therapy  OT Frequency:     Barriers to D/C:            Co-evaluation              AM-PAC PT "6 Clicks" Daily Activity     Outcome Measure Help from another person eating meals?: None Help from another person taking care of personal grooming?: None Help from another  person toileting, which includes using toliet, bedpan, or urinal?: None Help from another person bathing (including washing, rinsing, drying)?: A Little Help from another person to put on and taking off regular upper body clothing?: None Help from another person to put on and taking off regular lower body clothing?: A Little 6 Click Score: 22   End of Session Equipment Utilized During Treatment: Gait belt Nurse Communication: Mobility status  Activity Tolerance: Patient tolerated treatment well Patient left: in chair;with call bell/phone within reach  OT Visit Diagnosis: Other abnormalities of gait and mobility (R26.89);Pain Pain - part of body: (neck)                Time: 5784-6962 OT Time Calculation (min): 28 min Charges:  OT General Charges $OT Visit: 1 Visit OT Evaluation $OT Eval Low Complexity: 1 Low OT Treatments $Self Care/Home Management : 8-22 mins  Marcy Siren, OT Supplemental Rehabilitation Services Pager [phone] Office [phone]   Orlando Penner 06/14/2018, 9:26 AM

## 2018-06-14 NOTE — Evaluation (Signed)
Physical Therapy Evaluation Patient Details Name: [name] MRN: [id number] DOB: 1960-10-19 Today's Date: 06/14/2018   History of Present Illness  Pt is a 57 y/o female now s/p C3-4 laminotomy/foraminotomies. Pt with PMHx including deafness (L ear), cardiomyopathy, fibromyalgia, PTSD, depression, neuropathy, hx of previous C5-6 fusion, cardiac catheterization   Clinical Impression  Pt admitted with above diagnosis. Pt currently with functional limitations due to the deficits listed below (see PT Problem List). At the time of PT eval pt was able to perform transfers and ambulation with gross min guard assist to supervision for safety. Pt was educated on car transfer, positioning, and general safety with precaution management/activity progression. Pt will benefit from skilled PT to increase their independence and safety with mobility to allow discharge to the venue listed below.       Follow Up Recommendations No PT follow up;Supervision for mobility/OOB    Equipment Recommendations  None recommended by PT    Recommendations for Other Services       Precautions / Restrictions Precautions Precautions: Cervical Precaution Booklet Issued: Yes (comment) Precaution Comments: issued and reviewed with pt Required Braces or Orthoses: (per MD order set, "no brace needed") Restrictions Weight Bearing Restrictions: No      Mobility  Bed Mobility               General bed mobility comments: OOB in recliner upon arrival, verbally reviewed log roll technique. Pt declined practicing, stating "I think I've got it down."  Transfers Overall transfer level: Needs assistance Equipment used: None Transfers: Sit to/from Stand Sit to Stand: Supervision         General transfer comment: for general safety and immediate standing balance, no physical assist required  Ambulation/Gait Ambulation/Gait assistance: Min guard;Supervision Gait Distance (Feet): 200 Feet Assistive device:  None Gait Pattern/deviations: Step-through pattern;Decreased stride length;Drifts right/left Gait velocity: Decreased Gait velocity interpretation: <1.31 ft/sec, indicative of household ambulator General Gait Details: Pt appears unsteady at times and demonstrates drifting R and L throughout hall. Close supervision provided for safety with occasional min guard due to unsteadiness.   Stairs            Wheelchair Mobility    Modified Rankin (Stroke Patients Only)       Balance Overall balance assessment: No apparent balance deficits (not formally assessed)                                           Pertinent Vitals/Pain Pain Assessment: Faces Pain Score: 7  Faces Pain Scale: Hurts even more Pain Location: neck at incision site Pain Descriptors / Indicators: Discomfort;Sore Pain Intervention(s): Monitored during session;Repositioned    Home Living Family/patient expects to be discharged to:: Private residence Living Arrangements: Spouse/significant other Available Help at Discharge: Family;Available 24 hours/day(spouse home 1st week ) Type of Home: House Home Access: Stairs to enter Entrance Stairs-Rails: None Entrance Stairs-Number of Steps: 2 Home Layout: One level Home Equipment: Grab bars - toilet      Prior Function Level of Independence: Independent               Hand Dominance        Extremity/Trunk Assessment   Upper Extremity Assessment Upper Extremity Assessment: Overall WFL for tasks assessed    Lower Extremity Assessment Lower Extremity Assessment: Generalized weakness    Cervical / Trunk Assessment Cervical / Trunk Assessment: Other exceptions Cervical /  Trunk Exceptions: s/p cervical sx  Communication   Communication: No difficulties;Deaf(pt reports deaf in L ear)  Cognition Arousal/Alertness: Awake/alert Behavior During Therapy: WFL for tasks assessed/performed Overall Cognitive Status: Within Functional Limits for  tasks assessed                                        General Comments      Exercises     Assessment/Plan    PT Assessment Patient needs continued PT services  PT Problem List Decreased strength;Decreased range of motion;Decreased activity tolerance;Decreased balance;Decreased mobility;Decreased knowledge of use of DME;Decreased safety awareness;Decreased knowledge of precautions;Pain       PT Treatment Interventions DME instruction;Gait training;Stair training;Therapeutic activities;Functional mobility training;Therapeutic exercise;Neuromuscular re-education;Patient/family education    PT Goals (Current goals can be found in the Care Plan section)  Acute Rehab PT Goals Patient Stated Goal: hopeful for home today PT Goal Formulation: With patient Time For Goal Achievement: 06/21/18 Potential to Achieve Goals: Good    Frequency Min 5X/week   Barriers to discharge        Co-evaluation               AM-PAC PT "6 Clicks" Daily Activity  Outcome Measure Difficulty turning over in bed (including adjusting bedclothes, sheets and blankets)?: None Difficulty moving from lying on back to sitting on the side of the bed? : A Little Difficulty sitting down on and standing up from a chair with arms (e.g., wheelchair, bedside commode, etc,.)?: A Little Help needed moving to and from a bed to chair (including a wheelchair)?: A Little Help needed walking in hospital room?: A Little Help needed climbing 3-5 steps with a railing? : A Little 6 Click Score: 19    End of Session Equipment Utilized During Treatment: Gait belt Activity Tolerance: Patient tolerated treatment well Patient left: in chair;with call bell/phone within reach Nurse Communication: Mobility status PT Visit Diagnosis: Unsteadiness on feet (R26.81);Pain Pain - part of body: (neck)    Time: 4782-9562 PT Time Calculation (min) (ACUTE ONLY): 16 min   Charges:   PT Evaluation $PT Eval Moderate  Complexity: 1 Mod          Conni Slipper, PT, DPT Acute Rehabilitation Services Pager: [phone] Office: [phone]   Marylynn Pearson 06/14/2018, 9:38 AM

## 2018-06-14 NOTE — Anesthesia Postprocedure Evaluation (Signed)
Anesthesia Post Note  Patient: [name]  Procedure(s) Performed: Left Cervical three-four Laminotomy/foraminotomy (Left Back)     Patient location during evaluation: PACU Anesthesia Type: General Level of consciousness: awake and alert Pain management: pain level controlled Vital Signs Assessment: post-procedure vital signs reviewed and stable Respiratory status: spontaneous breathing, nonlabored ventilation, respiratory function stable and patient connected to nasal cannula oxygen Cardiovascular status: blood pressure returned to baseline and stable Postop Assessment: no apparent nausea or vomiting Anesthetic complications: no    Last Vitals:  Vitals:   06/14/18 0349 06/14/18 0725  BP: (!) 164/92 100/68  Pulse: 71 60  Resp: 18 19  Temp: 36.7 C 36.6 C  SpO2: 94% 100%    Last Pain:  Vitals:   06/14/18 0828  TempSrc:   PainSc: 8                  Kaydan Wong COKER

## 2018-06-14 NOTE — Progress Notes (Signed)
Patient is discharged from room 3C11 at this time. Alert and in stable condition. IV site d/c'd and instructions read to patient and spouse with understanding verbalized. Left unit via wheelchair with all belongings at side. 

## 2018-06-14 NOTE — Discharge Instructions (Signed)
Wound Care °Leave incision open to air. °You may shower. °Do not scrub directly on incision.  °Do not put any creams, lotions, or ointments on incision. °Activity °Walk each and every day, increasing distance each day. °No lifting greater than 5 lbs.  Avoid excessive neck motion. °No driving for 2 weeks; may ride as a passenger locally. °Diet °Resume your normal diet.  °Return to Work °Will be discussed at you follow up appointment. °Call Your Doctor If Any of These Occur °Redness, drainage, or swelling at the wound.  °Temperature greater than 101 degrees. °Severe pain not relieved by pain medication. °Increased difficulty swallowing. °Incision starts to come apart. °Follow Up Appt °Call today for appointment in 1-2 weeks ([phone]) or for problems.  If you have any hardware placed in your spine, you will need an x-ray before your appointment. °

## 2018-06-14 NOTE — Discharge Summary (Signed)
Physician Discharge Summary  Patient ID: [name] MRN: [id number] DOB/AGE: 1961-07-17 57 y.o.  Admit date: 06/13/2018 Discharge date: 06/14/2018  Admission Diagnoses: Cervical radiculopathy C4 left status post arthroplastycervical radiculopathy C4 left status post arthroplasty  Discharge Diagnoses: Cervical radiculopathy C4 leftcervical radiculopathy C4 left Active Problems:   Cervical radiculopathy   Discharged Condition: good  Hospital Course: Patient was admitted to undergo surgical decompression via posterior laminotomy and foraminotomies using a Metrixpatient was admitted to undergo surgical decompression via posterior laminotomy and foraminotomy using a metrics.  She tolerated surgery well. Technique. She tolerated surgery well.  Consults: None  Significant Diagnostic Studies: None  Treatments: surgery: cervical laminotomy and foraminotomy C3-4 left , using operative microscope and Metrix retractor   Discharge Exam: Blood pressure 100/68, pulse 60, temperature 97.9 F (36.6 C), temperature source Oral, resp. rate 19, height 5' 5.5" (1.664 m), weight 77.1 kg, SpO2 100 %. Incision is clean and dry, motor function is intact.incision is clean and dry, motor function is intact.  Disposition: Discharge disposition: 01-Home or Self Care       Discharge Instructions    Call MD for:  redness, tenderness, or signs of infection (pain, swelling, redness, odor or green/yellow discharge around incision site)   Complete by:  As directed    Call MD for:  severe uncontrolled pain   Complete by:  As directed    Call MD for:  temperature >100.4   Complete by:  As directed    Diet - low sodium heart healthy   Complete by:  As directed    Incentive spirometry RT   Complete by:  As directed    Increase activity slowly   Complete by:  As directed      Allergies as of 06/14/2018      Reactions   Lyrica [pregabalin] Rash   Neurontin [gabapentin] Other (See Comments)   Sleepy all the time      Medication List    TAKE these medications   acetaminophen 500 MG tablet Commonly known as:  TYLENOL Take 1,000 mg by mouth every 6 (six) hours as needed for headache (pain).   bisacodyl 5 MG EC tablet Commonly known as:  DULCOLAX Take 10 mg by mouth daily.   busPIRone 10 MG tablet Commonly known as:  BUSPAR Take 1 tablet (10 mg total) by mouth 3 (three) times daily.   citalopram 20 MG tablet Commonly known as:  CELEXA TAKE 1 TABLET(20 MG) BY MOUTH DAILY What changed:  See the new instructions.   diazepam 5 MG tablet Commonly known as:  VALIUM Take 1 tablet (5 mg total) by mouth every 6 (six) hours as needed for muscle spasms.   hydrochlorothiazide 25 MG tablet Commonly known as:  HYDRODIURIL TAKE 1 TABLET(25 MG) BY MOUTH DAILY What changed:  See the new instructions.   HYDROcodone-acetaminophen 7.5-325 MG tablet Commonly known as:  NORCO Take 1-2 tablets by mouth every 6 (six) hours as needed for severe pain (pain). What changed:    how much to take  reasons to take this   hydrOXYzine 25 MG tablet Commonly known as:  ATARAX/VISTARIL TAKE 3 TO 4 TABLETS BY MOUTH EVERY 8 HOURS AS NEEDED FOR ANXIETY   ibuprofen 200 MG tablet Commonly known as:  ADVIL,MOTRIN Take 400 mg by mouth every 6 (six) hours as needed for headache (pain).   loratadine 10 MG tablet Commonly known as:  CLARITIN Take 10 mg by mouth daily as needed for allergies.   magnesium gluconate 500  MG tablet Commonly known as:  MAGONATE Take 500 mg by mouth daily.   metoprolol tartrate 25 MG tablet Commonly known as:  LOPRESSOR Take 1 tablet (25 mg total) by mouth 2 (two) times daily.   naproxen 500 MG tablet Commonly known as:  NAPROSYN TAKE 1 TABLET(500 MG) BY MOUTH TWICE DAILY WITH A MEAL What changed:  See the new instructions.   tizanidine 6 MG capsule Commonly known as:  ZANAFLEX Take 1 capsule (6 mg total) by mouth 3 (three) times daily as needed for muscle  spasms.   vitamin B-12 500 MCG tablet Commonly known as:  CYANOCOBALAMIN Take 500 mcg by mouth daily.        SignedStefani Dama 06/14/2018, 10:47 AM

## 2018-06-20 ENCOUNTER — Other Ambulatory Visit (HOSPITAL_COMMUNITY): Payer: Self-pay | Admitting: Neurological Surgery

## 2018-06-20 DIAGNOSIS — R609 Edema, unspecified: Secondary | ICD-10-CM

## 2018-06-21 ENCOUNTER — Emergency Department (HOSPITAL_COMMUNITY)
Admission: EM | Admit: 2018-06-21 | Discharge: 2018-06-22 | Disposition: A | Discharge status: Home / Self Care | Payer: BLUE CROSS/BLUE SHIELD | Attending: Emergency Medicine | Admitting: Emergency Medicine

## 2018-06-21 ENCOUNTER — Other Ambulatory Visit: Payer: Self-pay

## 2018-06-21 ENCOUNTER — Telehealth: Payer: Self-pay | Admitting: *Deleted

## 2018-06-21 ENCOUNTER — Emergency Department (HOSPITAL_COMMUNITY): Payer: BLUE CROSS/BLUE SHIELD

## 2018-06-21 ENCOUNTER — Encounter (HOSPITAL_COMMUNITY): Payer: Self-pay | Admitting: Emergency Medicine

## 2018-06-21 ENCOUNTER — Ambulatory Visit (HOSPITAL_BASED_OUTPATIENT_CLINIC_OR_DEPARTMENT_OTHER)
Admission: RE | Admit: 2018-06-21 | Discharge: 2018-06-21 | Disposition: A | Discharge status: Home / Self Care | Payer: BLUE CROSS/BLUE SHIELD | Source: Ambulatory Visit

## 2018-06-21 DIAGNOSIS — I110 Hypertensive heart disease with heart failure: Secondary | ICD-10-CM | POA: Insufficient documentation

## 2018-06-21 DIAGNOSIS — I5030 Unspecified diastolic (congestive) heart failure: Secondary | ICD-10-CM | POA: Diagnosis not present

## 2018-06-21 DIAGNOSIS — J984 Other disorders of lung: Secondary | ICD-10-CM | POA: Diagnosis not present

## 2018-06-21 DIAGNOSIS — F17210 Nicotine dependence, cigarettes, uncomplicated: Secondary | ICD-10-CM | POA: Insufficient documentation

## 2018-06-21 DIAGNOSIS — Z79899 Other long term (current) drug therapy: Secondary | ICD-10-CM | POA: Diagnosis not present

## 2018-06-21 DIAGNOSIS — R936 Abnormal findings on diagnostic imaging of limbs: Secondary | ICD-10-CM | POA: Insufficient documentation

## 2018-06-21 DIAGNOSIS — R7989 Other specified abnormal findings of blood chemistry: Secondary | ICD-10-CM | POA: Diagnosis not present

## 2018-06-21 DIAGNOSIS — I82491 Acute embolism and thrombosis of other specified deep vein of right lower extremity: Secondary | ICD-10-CM | POA: Diagnosis not present

## 2018-06-21 DIAGNOSIS — Z7901 Long term (current) use of anticoagulants: Secondary | ICD-10-CM | POA: Insufficient documentation

## 2018-06-21 DIAGNOSIS — R609 Edema, unspecified: Secondary | ICD-10-CM | POA: Diagnosis not present

## 2018-06-21 DIAGNOSIS — M7989 Other specified soft tissue disorders: Secondary | ICD-10-CM | POA: Insufficient documentation

## 2018-06-21 DIAGNOSIS — R0602 Shortness of breath: Secondary | ICD-10-CM | POA: Diagnosis not present

## 2018-06-21 DIAGNOSIS — I82409 Acute embolism and thrombosis of unspecified deep veins of unspecified lower extremity: Secondary | ICD-10-CM | POA: Diagnosis not present

## 2018-06-21 LAB — CBC
HCT: 40.8 % (ref 36.0–46.0)
Hemoglobin: 12.3 g/dL (ref 12.0–15.0)
MCH: 29.6 pg (ref 26.0–34.0)
MCHC: 30.1 g/dL (ref 30.0–36.0)
MCV: 98.3 fL (ref 80.0–100.0)
Platelets: 495 10*3/uL — ABNORMAL HIGH (ref 150–400)
RBC: 4.15 MIL/uL (ref 3.87–5.11)
RDW: 12.3 % (ref 11.5–15.5)
WBC: 8.7 10*3/uL (ref 4.0–10.5)
nRBC: 0 % (ref 0.0–0.2)

## 2018-06-21 LAB — BASIC METABOLIC PANEL
Anion gap: 12 (ref 5–15)
BUN: 11 mg/dL (ref 6–20)
CO2: 21 mmol/L — ABNORMAL LOW (ref 22–32)
Calcium: 9 mg/dL (ref 8.9–10.3)
Chloride: 99 mmol/L (ref 98–111)
Creatinine, Ser: 0.69 mg/dL (ref 0.44–1.00)
GFR calc Af Amer: >60 mL/min (ref >60)
GFR calc non Af Amer: >60 mL/min (ref >60)
Glucose, Bld: 80 mg/dL (ref 70–99)
Potassium: 4.5 mmol/L (ref 3.5–5.1)
Sodium: 132 mmol/L — ABNORMAL LOW (ref 135–145)

## 2018-06-21 LAB — I-STAT BETA HCG BLOOD, ED (MC, WL, AP ONLY): I-stat hCG, quantitative: <5 m[IU]/mL (ref <5)

## 2018-06-21 LAB — I-STAT TROPONIN, ED: Troponin i, poc: 0 ng/mL (ref 0.00–0.08)

## 2018-06-21 MED ORDER — FENTANYL CITRATE (PF) 100 MCG/2ML IJ SOLN
50.0000 ug | Freq: Once | INTRAMUSCULAR | Status: AC
  Administered 2018-06-22: 50 ug via INTRAVENOUS
  Filled 2018-06-21: qty 2

## 2018-06-21 NOTE — Telephone Encounter (Signed)
Start on Xarelto 15 mg po bid x 21 days then increase to 20 mg ta, 1 tab po daily for 3 months.  If we have a starter pack, can give him that.

## 2018-06-21 NOTE — ED Notes (Signed)
Results reviewed, no changes in acuity at this time 

## 2018-06-21 NOTE — Progress Notes (Signed)
Preliminary notes---Bilateral lower extremities venous duplex exam completed.  Right profounda vein with normal flow and doppler but not completely compressible, may have short segment partially thrombosed, age indeterminate.  Result called ordering physician's office spoke with Dr. Verlee Rossetti assistant Thereasa Parkin, discussed about the questionable possible short segmental thrombosis on right profunda vein. Patient was instructed to go home and the office will contact with the patient ASAP.  Will e-fax to Dr. Danielle Dess. Heidi Lemay H Mellissa Conley(RDMS RVT) 06/21/18 10:18 AM

## 2018-06-21 NOTE — Telephone Encounter (Signed)
Starter pack is available in the office. Attempted to notify pt but line is busy.

## 2018-06-21 NOTE — Telephone Encounter (Signed)
Author phoned pt. back re: xarelto recommendation. Pt. states she has been having high BP since her laminectomy. Pt. took it while on the phone and reported reading of 148/116, pulse 76. Pt. then retook BP and it read 186/114.  Pt. denied chest pain, and stated she does feel sob at times, "but I'm a smoker". Pt. did admit that in the past, she has had to be admitted into the ED for anticoagulation monitoring (like after birth of son).  Author advised pt. to go to ED ASAP, and pt. stated she would go to [facility] in the next hour when her ride can bring her. Routed to Dr. Carmelia Roller as Lorain Childes.

## 2018-06-21 NOTE — ED Triage Notes (Signed)
Pt presents to ED for assessment of SOB after being diagnosed with DVT today.  Was going to be given Xarelto prescription by PCP, but patient concerned about SOB, and concerned for wanting to be monitored on blood thinner for first 24 hours, so her PCP told her to come to the ER.  Patient states pressure in her chest as well.  Pt is a smoker.  Hx of clots.

## 2018-06-21 NOTE — Telephone Encounter (Signed)
Patient returned call to  Office and said she fell asleep and missed previous calls . Spoke to  [place]  who states she  will call patient back , please call her at  [phone]

## 2018-06-21 NOTE — Telephone Encounter (Signed)
Called pt and left detailed message on cell voicemail to call the office ASAP regarding below result recommendation.

## 2018-06-21 NOTE — Telephone Encounter (Signed)
Received call from [name] w/ Dr Tonia Brooms office stating pt is status post cervical laminectomy from 06/13/18. She was seen by them today and had reported some swelling in lower extremity. U/S showed "non occlusive DVT in the short segment of the femoral profunda on the right proximal side. Dr Danielle Dess is requesting that Primary Care manage coagulation for this.  PCP is out of the office today. Routed to Doc of Day Abner Greenspan).  Please advise?

## 2018-06-21 NOTE — Telephone Encounter (Signed)
Received call from Tiffany, from registration department at [facility] asking if the pt was going to be admitted because she did not see any admission orders. Tiffany notified that the pt spoke with Emily,RN earlier today and voiced complaints of feeling SOB at times and increased BP. Irving Burton, RN advised pt to go to ED for evaluation of these symptoms. Per previous notes by [facility] pt did mention having to be admitted to the ED for anticoagulation monitoring. Tiffany verbalized understanding and requested that the pt be notified of this as well. Spoke with pt and advised pt that she was told to go to the ED to have evaluation of SOB and increased BP readings. Pt advised that after her current symptoms were evaluated the ED provider would make the decision of being admitted to the hospital. Pt verbalized understanding and states she will stay in the ED for further evaluation.

## 2018-06-21 NOTE — ED Provider Notes (Signed)
[facility] EMERGENCY DEPARTMENT Provider Note   CSN: [id number] Arrival date & time: 06/21/18  1830     History   Chief Complaint Chief Complaint  Patient presents with  . Shortness of Breath    HPI [name] is a 57 y.o. female with PMH/o Back pain, DVT who presents for evaluation of DVT and SOB.  Patient reports that she is one-week status post cervical laminectomy.  She has been discussing with her doctor that she has been having some pain in her bilateral lower extremities so he arrange for an outpatient bilateral venous duplex of her lower extremities today.  She had the ultrasound done today that showed a age-indeterminate possible thrombosis of the profunda on the right lower extremity.  No DVT seen on the left lower extremity.  Her neurosurgeon called her primary care doctor to manage her coagulation who then prescribed her Xarelto.  Patient reports that she never went and picked up the prescription.  She called the primary care office back today and had mentioned over the phone to the nurse that she was worried about her blood pressure being elevated after surgery.  She reports that she took it at home and she had systolic blood pressure in the 140s and then repeated the blood pressure and it was again in the 180s.  She also mention she had been having some transient shortness of breath and reports that the nurse told her to go to the emergency department.  Additionally, patient states that she "came to the emergency department to be treated from a DVT."  Patient reports she has had DVTs previously.  She reports that her first one was while she was pregnant and it was thought to be due from pregnancy complications.  She reports she is was been treated with heparin for the DVTs and did not know about the Xarelto.  Patient also reports she has been having some intermittent shortness of breath over the last week.  She states it is not worse with exertion.  She does  report that she is actually been able to increase her daily activities.  She states that she does have a history of smoking and blebs in her lungs.  She states that sometimes when she smokes a lot, contributes to shortness of breath.  Additionally, patient reports that she is very anxious about the blood clot and felt like she did not know enough about the Xarelto that she needed to come and get "inpatient treatment for her DVT."  Patient denies any fevers, cough, chest pain, abdominal pain.  The history is provided by the patient.    Past Medical History:  Diagnosis Date  . Back injury    Chronic  . Bronchitis   . Cardiomyopathy ([place])    hypertension controlled-helped with edema-Dr Nahser  . Chest pain    Normal heart catherization  . Collapsed lung   . Deafness    Left ear  . Degenerative joint disease    with neck surgery  . Depression   . Diastolic dysfunction    with possible mild LVOT gradient  . Dysrhythmia   . Family history of adverse reaction to anesthesia    " my daughter has PONV and gets very grumpy"  . Fibromyalgia   . Former cigarette smoker   . Gestational diabetes   . H/O blood clots    due to side effect of medication  . Headache(784.0)    migraines  . Hypertension   . Leg  cramps   . Neuropathy   . Pain    chronic   . Pneumonia   . Pneumothorax   . PTSD (post-traumatic stress disorder)   . Respiratory disorder     Patient Active Problem List   Diagnosis Date Noted  . Cervical radiculopathy 06/13/2018  . GAD (generalized anxiety disorder) 03/09/2018  . Fibromyalgia 06/19/2016  . Spondylosis of cervical joint 02/21/2016  . HTN (hypertension) 08/27/2014  . Allodynia 05/17/2013  . Chest pain syndrome 04/28/2013  . Mild HOCM (hypertrophic obstructive cardiomyopathy) ([place]) 04/28/2013  . Palpitations 04/28/2013    Past Surgical History:  Procedure Laterality Date  . ABDOMINAL HYSTERECTOMY     partial  . CARDIAC CATHETERIZATION     Ejection  Fraction 65-70%  . CERVICAL DISC ARTHROPLASTY N/A 02/21/2016   Procedure: Cervical three-four artificial disc replacement;  Surgeon: Kristeen Miss, MD;  Location: [facility];  Service: Neurosurgery;  Laterality: N/A;  . CERVICAL FUSION     C5-6  . EXTERNAL EAR SURGERY     tumor at age 35, 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  . HERNIA REPAIR     " as a baby"  . NECK SURGERY    . OTHER SURGICAL HISTORY     History of cervical and lumbar disk surgery  . polypectomies     tubes  . POSTERIOR CERVICAL LAMINECTOMY WITH MET- RX Left 06/13/2018   Procedure: Left Cervical three-four Laminotomy/foraminotomy;  Surgeon: Kristeen Miss, MD;  Location: [facility];  Service: Neurosurgery;  Laterality: Left;  . TONSILLECTOMY AND ADENOIDECTOMY       OB History   None      Home Medications    Prior to Admission medications   Medication Sig Start Date End Date Taking? Authorizing Provider  bisacodyl (DULCOLAX) 5 MG EC tablet Take 5 mg by mouth 2 (two) times daily as needed for moderate constipation.    Yes [provider]  busPIRone (BUSPAR) 10 MG tablet Take 1 tablet (10 mg total) by mouth 3 (three) times daily. 06/10/18  Yes Wendling, Crosby Oyster, DO  citalopram (CELEXA) 20 MG tablet TAKE 1 TABLET(20 MG) BY MOUTH DAILY Patient taking differently: Take 20 mg by mouth daily.  06/06/18  Yes Wendling, Crosby Oyster, DO  diazepam (VALIUM) 5 MG tablet Take 1 tablet (5 mg total) by mouth every 6 (six) hours as needed for muscle spasms. 06/14/18  Yes Elsner, Mallie Mussel, MD  hydrochlorothiazide (HYDRODIURIL) 25 MG tablet TAKE 1 TABLET(25 MG) BY MOUTH DAILY Patient taking differently: Take 25 mg by mouth daily.  02/23/18  Yes Shelda Pal, DO  ibuprofen (ADVIL,MOTRIN) 200 MG tablet Take 400 mg by mouth every 6 (six) hours as needed for headache or mild pain.    Yes [provider]  loratadine (CLARITIN) 10 MG tablet Take 10 mg by mouth 2 (two) times daily as needed (for seasonal allergies).    Yes  [provider]  Magnesium 500 MG TABS Take 500 mg by mouth daily.   Yes [provider]  metoprolol tartrate (LOPRESSOR) 25 MG tablet Take 1 tablet (25 mg total) by mouth 2 (two) times daily. 12/23/17  Yes Nahser, Wonda Cheng, MD  tizanidine (ZANAFLEX) 6 MG capsule Take 1 capsule (6 mg total) by mouth 3 (three) times daily as needed for muscle spasms. Patient taking differently: Take 6 mg by mouth See admin instructions. Take 6 mg by mouth every four to six hours 06/14/18  Yes Kristeen Miss, MD  vitamin B-12 (CYANOCOBALAMIN) 500 MCG tablet Take  500 mcg by mouth daily.   Yes [provider]  acetaminophen (TYLENOL) 500 MG tablet Take 1,000 mg by mouth every 6 (six) hours as needed for mild pain or headache.     [provider]  HYDROcodone-acetaminophen (NORCO/VICODIN) 5-325 MG tablet Take 1-2 tablets by mouth every 6 (six) hours as needed. 06/22/18   [place] Lanius A, PA-C  hydrOXYzine (ATARAX/VISTARIL) 25 MG tablet TAKE 3 TO 4 TABLETS BY MOUTH EVERY 8 HOURS AS NEEDED FOR ANXIETY Patient not taking: Reported on 06/22/2018 06/07/18   Shelda Pal, DO  naproxen (NAPROSYN) 500 MG tablet TAKE 1 TABLET(500 MG) BY MOUTH TWICE DAILY WITH A MEAL Patient taking differently: Take 500 mg by mouth 2 (two) times daily with a meal.  06/06/18   Wendling, Crosby Oyster, DO  Rivaroxaban 15 & 20 MG TBPK Take as directed on package: Start with one '15mg'$  tablet by mouth twice a day with food. On Day 22, switch to one '20mg'$  tablet once a day with food. 06/22/18   Volanda Napoleon, PA-C    Family History Family History  Problem Relation Age of Onset  . Coronary artery disease Father   . Parkinson's disease Mother   . Cancer Sister        breast  . Diabetes Sister   . Diabetes Brother     Social History Social History   Tobacco Use  . Smoking status: Current Some Day Smoker    Packs/day: 1.00    Years: 25.00    Pack years: 25.00    Types: Cigarettes  . Smokeless  tobacco: Never Used  Substance Use Topics  . Alcohol use: Yes    Comment: very rare  . Drug use: No     Allergies   Morphine and related; Lyrica [pregabalin]; and Neurontin [gabapentin]   Review of Systems Review of Systems  Constitutional: Negative for fever.  Respiratory: Positive for shortness of breath. Negative for cough.   Cardiovascular: Positive for leg swelling. Negative for chest pain.  Gastrointestinal: Negative for abdominal pain, nausea and vomiting.  Genitourinary: Negative for dysuria and hematuria.  Neurological: Negative for headaches.  All other systems reviewed and are negative.    Physical Exam Updated Vital Signs BP 137/90   Pulse 75   Resp 16   Ht 5' 5.5" (1.664 m)   Wt 77.1 kg   SpO2 97%   BMI 27.86 kg/m   Physical Exam  Constitutional: She is oriented to person, place, and time. She appears well-developed and well-nourished.  Sitting comfortably on examination table  HENT:  Head: Normocephalic and atraumatic.  Mouth/Throat: Oropharynx is clear and moist and mucous membranes are normal.  Eyes: Pupils are equal, round, and reactive to light. Conjunctivae, EOM and lids are normal.  Neck: Full passive range of motion without pain.  Cardiovascular: Normal rate, regular rhythm, normal heart sounds and normal pulses. Exam reveals no gallop and no friction rub.  No murmur heard. Pulmonary/Chest: Effort normal and breath sounds normal.  Lungs clear to auscultation bilaterally.  Symmetric chest rise.  No wheezing, rales, rhonchi.  Abdominal: Soft. Normal appearance. There is no tenderness. There is no rigidity and no guarding.  Musculoskeletal: Normal range of motion.  Right lower extremity is slightly more swollen than left lower extremity.  No overlying warmth, erythema, edema.  Neurological: She is alert and oriented to person, place, and time.  Sensation intact along major nerve distributions of BLE  Skin: Skin is warm and dry. Capillary refill  takes  less than 2 seconds.  Good distal cap refill. RLE is not dusky in appearance or cool to touch.  Psychiatric: She has a normal mood and affect. Her speech is normal.  Nursing note and vitals reviewed.    ED Treatments / Results  Labs (all labs ordered are listed, but only abnormal results are displayed) Labs Reviewed  BASIC METABOLIC PANEL - Abnormal; Notable for the following components:      Result Value   Sodium 132 (*)    CO2 21 (*)    All other components within normal limits  CBC - Abnormal; Notable for the following components:   Platelets 495 (*)    All other components within normal limits  I-STAT TROPONIN, ED  I-STAT BETA HCG BLOOD, ED ([facility], [facility], [facility] ONLY)    EKG None  Radiology Dg Chest 2 View  Result Date: 06/21/2018 CLINICAL DATA:  Bilateral ankle swelling for several days. EXAM: CHEST - 2 VIEW COMPARISON:  October 31, 2013 FINDINGS: The heart size and mediastinal contours are within normal limits. Both lungs are clear. The visualized skeletal structures are unremarkable. IMPRESSION: No active cardiopulmonary disease. Electronically Signed   By: Abelardo Diesel M.D.   On: 06/21/2018 20:12   Ct Angio Chest Pe W And/or Wo Contrast  Result Date: 06/22/2018 CLINICAL DATA:  57 year old female with shortness of breath. Concern for pulmonary embolism. EXAM: CT ANGIOGRAPHY CHEST WITH CONTRAST TECHNIQUE: Multidetector CT imaging of the chest was performed using the standard protocol during bolus administration of intravenous contrast. Multiplanar CT image reconstructions and MIPs were obtained to evaluate the vascular anatomy. CONTRAST:  4m ISOVUE-370 IOPAMIDOL (ISOVUE-370) INJECTION 76% COMPARISON:  Chest radiograph dated 06/21/2018 FINDINGS: Cardiovascular: There is no cardiomegaly or pericardial effusion. The thoracic aorta is unremarkable. There is a left-sided aortic arch with aberrant right subclavian artery anatomy. Evaluation of the pulmonary arteries is somewhat  limited due to mixing artifact. There is no CT evidence of pulmonary embolism. Mediastinum/Nodes: No hilar or mediastinal adenopathy. Esophagus and the thyroid gland are grossly unremarkable. No mediastinal fluid collection. Lungs/Pleura: There is a patchy area of ground-glass density in the right upper lobe and right suprahilar region which may represent atelectasis or scarring but pneumonia is not excluded. Clinical correlation is recommended. Additional smaller area of ground-glass density noted in the left upper lobe along the fissure. There is no lobar consolidation, pleural effusion, or pneumothorax. The central airways are patent. Upper Abdomen: No acute abnormality. Musculoskeletal: No chest wall abnormality. No acute or significant osseous findings. Review of the MIP images confirms the above findings. IMPRESSION: 1. No CT evidence of pulmonary embolism. 2. Patchy areas of ground-glass density in the right upper lobe and right suprahilar region may represent atelectasis or scarring but pneumonia is not excluded. Clinical correlation and follow-up recommended. Electronically Signed   By: AAnner CreteM.D.   On: 06/22/2018 02:28   Vas [place] Extremity Venous (dvt)  Result Date: 06/21/2018  Lower Venous Study Indications: Swelling, and Pain. Other Indications: Cervical laminectomy on 06/13/2018. Limitations: Body habitus and very sensitive for compression by probe. Performing Technologist: HLorina Rabon Examination Guidelines: A complete evaluation includes B-mode imaging, spectral Doppler, color Doppler, and power Doppler as needed of all accessible portions of each vessel. Bilateral testing is considered an integral part of a complete examination. Limited examinations for reoccurring indications may be performed as noted.  Right Venous Findings: +---------+---------------+---------+-----------+----------+-------------------+          CompressibilityPhasicitySpontaneityPropertiesSummary              +---------+---------------+---------+-----------+----------+-------------------+  CFV      Full           Yes      Yes                                      +---------+---------------+---------+-----------+----------+-------------------+ SFJ      Full                                                             +---------+---------------+---------+-----------+----------+-------------------+ FV Prox  Full                                                             +---------+---------------+---------+-----------+----------+-------------------+ FV Mid   Full                                                             +---------+---------------+---------+-----------+----------+-------------------+ FV DistalFull                                                             +---------+---------------+---------+-----------+----------+-------------------+ PFV      Partial                 Yes                  questionable                                                              partially                                                                 thrombosed          +---------+---------------+---------+-----------+----------+-------------------+ POP      Full           Yes      Yes                                      +---------+---------------+---------+-----------+----------+-------------------+ PTV      Full                                                             +---------+---------------+---------+-----------+----------+-------------------+  PERO     Full                                                             +---------+---------------+---------+-----------+----------+-------------------+  Right Technical Findings: Right profounda vein with normal flow and doppler but not completely compressible, may have short segment partially thrombosed.  Left Venous Findings:  +---------+---------------+---------+-----------+----------+-------+          CompressibilityPhasicitySpontaneityPropertiesSummary +---------+---------------+---------+-----------+----------+-------+ CFV      Full           Yes      Yes                          +---------+---------------+---------+-----------+----------+-------+ SFJ      Full                                                 +---------+---------------+---------+-----------+----------+-------+ FV Prox  Full                                                 +---------+---------------+---------+-----------+----------+-------+ FV Mid   Full                                                 +---------+---------------+---------+-----------+----------+-------+ FV DistalFull                                                 +---------+---------------+---------+-----------+----------+-------+ PFV      Full                                                 +---------+---------------+---------+-----------+----------+-------+ POP      Full           Yes      Yes                          +---------+---------------+---------+-----------+----------+-------+ PTV      Full                                                 +---------+---------------+---------+-----------+----------+-------+ PERO     Full                                                 +---------+---------------+---------+-----------+----------+-------+   Summary: Right: Findings consistent with age indeterminate deep vein thrombosis involving  the right proximal profunda vein. No cystic structure found in the popliteal fossa. Left: There is no evidence of deep vein thrombosis in the lower extremity. However, portions of this examination were limited- see technologist comments above. No cystic structure found in the popliteal fossa.  *See table(s) above for measurements and observations. Electronically signed by Harold Barban MD on 06/21/2018 at  5:39:54 PM.    Final     Procedures Procedures (including critical care time)  Medications Ordered in ED Medications  iopamidol (ISOVUE-370) 76 % injection (has no administration in time range)  Rivaroxaban (XARELTO) tablet 15 mg (has no administration in time range)  rivaroxaban [employer]) Education Kit for DVT/PE patients (has no administration in time range)  fentaNYL (SUBLIMAZE) injection 50 mcg (50 mcg Intravenous Given 06/22/18 0016)  iopamidol (ISOVUE-370) 76 % injection 100 mL (49 mLs Intravenous Contrast Given 06/22/18 0146)  HYDROcodone-acetaminophen (NORCO/VICODIN) 5-325 MG per tablet 1 tablet (1 tablet Oral Given 06/22/18 0304)     Initial Impression / Assessment and Plan / ED Course  I have reviewed the triage vital signs and the nursing notes.  Pertinent labs & imaging results that were available during my care of the patient were reviewed by me and considered in my medical decision making (see chart for details).     57 year old female with known right lower extremity DVT comes in for evaluation of "treatment of DVT" and shortness of breath.  Primary care doctor had prescribed her Xarelto but patient states that whenever she had DVTs before, she got heparin and she was worried about starting on Xarelto.  Patient also mentioned to triage nurse that she was having shortness of breath so they sent her to the emergency department for further evaluation. Patient is afebrile, non-toxic appearing, sitting comfortably on examination table. Vital signs reviewed and stable.  On exam, patient has some mild tenderness noted to the right calf.  No overlying warmth, erythema.  No evidence of respiratory distress.  Given her history and known DVT, will plan for CTA of chest to rule out PE.  I suspect that her shortness of breath is likely due to anxiety versus chronic dyspnea from smoking.  She states is been transient over the last week it is not worse with exertion but given her recent DVT,  will plan to rule out PE.  Initial labs and imaging ordered at triage.  BMP shows bicarb 21.  Otherwise unremarkable.  CBC without any significant leukocytosis or anemia.  Troponin negative.  Patient is 1 week s/p cervical laminectomy ([place]). He had contacted PCP to have them manage patient's anticoagulation. Dr. Charlett Blake had directed patient to start on Xarelto. Patient called the RN and had mentioned that she was worried about her blood pressure being elevated. During that discussion, patient had mentioned some transient SOB and had told her that previously when she had been treated for DVT's she was admitted for anticoagulation. RN instructed patient to go to the ED for further evaluation.   Review of vascular ultrasound that was done today.  Left lower extremity showed no evidence of DVT.  Right lower extremity showed findings consistent with age-indeterminate DVT involving the right proximal profunda vein.  CTA shows no evidence of PE.  Discussed results with patient.  Patient is resting comfortably in the bed without any signs of acute distress.  No evidence of respiratory distress.   Discussed with Al Pimple, PA-C (Neurosurgery).  Contra indication for starting patient on anticoagulation given recent postop status.  Discussed treatment options  with patient.  I discussed with patient that at this time, she is hemodynamically stable with no evidence of PE and her DVT seems to be nonocclusive and isolated to profundus.  At this time, it is reasonable to have patient treated with outpatient anticoagulation such as Xarelto with good follow-up with her primary care doctor.  Patient is comfortable with this plan.  Will give versus Xarelto here in the ED.  Instructed patient to follow-up with her primary care doctor as directed. Patient had ample opportunity for questions and discussion. All patient's questions were answered with full understanding. Strict return precautions discussed. Patient  expresses understanding and agreement to plan.   Final Clinical Impressions(s) / ED Diagnoses   Final diagnoses:  Acute deep vein thrombosis (DVT) of other specified vein of right lower extremity [facility])    ED Discharge Orders         Ordered    Rivaroxaban 15 & 20 MG TBPK     06/22/18 0248    HYDROcodone-acetaminophen (NORCO/VICODIN) 5-325 MG tablet  Every 6 hours PRN     06/22/18 0253           Volanda Napoleon, PA-C 92/76/[id number]    Delora Fuel, MD 03/79/44 [phone]

## 2018-06-21 NOTE — ED Notes (Addendum)
Updated on delays and wait time.  Patient stating she would like to leave.  This RN encouraged her to give her a little time to find an option

## 2018-06-21 NOTE — Telephone Encounter (Signed)
Appreciative of recs. She should follow up with [place] at some point. ER if having sob or chest pain. TY.

## 2018-06-22 ENCOUNTER — Telehealth: Payer: Self-pay

## 2018-06-22 ENCOUNTER — Emergency Department (HOSPITAL_COMMUNITY): Payer: BLUE CROSS/BLUE SHIELD

## 2018-06-22 DIAGNOSIS — J984 Other disorders of lung: Secondary | ICD-10-CM | POA: Diagnosis not present

## 2018-06-22 MED ORDER — RIVAROXABAN 15 MG PO TABS
15.0000 mg | ORAL_TABLET | Freq: Once | ORAL | Status: AC
  Administered 2018-06-22: 15 mg via ORAL
  Filled 2018-06-22: qty 1

## 2018-06-22 MED ORDER — IOPAMIDOL (ISOVUE-370) INJECTION 76%
100.0000 mL | Freq: Once | INTRAVENOUS | Status: AC | PRN
  Administered 2018-06-22: 49 mL via INTRAVENOUS

## 2018-06-22 MED ORDER — HYDROCODONE-ACETAMINOPHEN 5-325 MG PO TABS: 1.0000 | ORAL_TABLET | Freq: Four times a day (QID) | ORAL | 0 refills | Status: DC | PRN

## 2018-06-22 MED ORDER — HYDROCODONE-ACETAMINOPHEN 5-325 MG PO TABS
1.0000 | ORAL_TABLET | Freq: Once | ORAL | Status: AC
  Administered 2018-06-22: 1 via ORAL
  Filled 2018-06-22: qty 1

## 2018-06-22 MED ORDER — RIVAROXABAN (XARELTO) EDUCATION KIT FOR DVT/PE PATIENTS
PACK | Freq: Once | Status: AC
  Administered 2018-06-22: 03:00:00
  Filled 2018-06-22: qty 1

## 2018-06-22 MED ORDER — IOPAMIDOL (ISOVUE-370) INJECTION 76%
INTRAVENOUS | Status: AC
  Filled 2018-06-22: qty 100

## 2018-06-22 MED ORDER — RIVAROXABAN (XARELTO) VTE STARTER PACK (15 & 20 MG): ORAL_TABLET | ORAL | 0 refills | Status: DC

## 2018-06-22 NOTE — ED Notes (Signed)
Pharmacy at bedside

## 2018-06-22 NOTE — Telephone Encounter (Signed)
Pt. Phoned author, stating that the xarelto starter pack that was prescribed by ED is $900 that she cannot afford. CT to r/o PE was negative, negative troponin. Author confirmed that we have starter pack and discount card for her to use, and pt. Stated her husband would come by today to pick up. Dr. Carmelia Roller and Zella Ball, CMA, made aware.

## 2018-06-22 NOTE — ED Notes (Signed)
Patient transported to CT 

## 2018-06-22 NOTE — Discharge Instructions (Signed)
You can take 1000 mg of Tylenol.  Do not exceed 4000 mg of Tylenol a day.  You can take pain medication for severe or breakthrough pain.   Xarelto as directed.  You will need to follow-up with your primary care doctor to continue this medication.  Return to emergency department for any worsening pain, numbness/weakness of the leg, discoloration of the leg, difficulty breathing, chest pain or any other worsening or concerning symptoms.

## 2018-06-22 NOTE — Telephone Encounter (Signed)
06/22/18  Called patient to schedule ED follow up. Unable too schedule at this time;  Patient states she will call back to schedule.

## 2018-06-23 ENCOUNTER — Encounter: Payer: Self-pay | Admitting: Family Medicine

## 2018-06-23 ENCOUNTER — Ambulatory Visit (INDEPENDENT_AMBULATORY_CARE_PROVIDER_SITE_OTHER): Payer: BLUE CROSS/BLUE SHIELD | Admitting: Family Medicine

## 2018-06-23 VITALS — BP 126/70 | HR 100 | Temp 98.0°F | Resp 16 | Ht 65.5 in | Wt 175.5 lb

## 2018-06-23 DIAGNOSIS — I824Y1 Acute embolism and thrombosis of unspecified deep veins of right proximal lower extremity: Secondary | ICD-10-CM

## 2018-06-23 MED ORDER — BUSPIRONE HCL 10 MG PO TABS: 10.0000 mg | ORAL_TABLET | Freq: Three times a day (TID) | ORAL | 1 refills | Status: DC

## 2018-06-23 NOTE — Patient Instructions (Signed)
Find those records about why you had clots after your first one in pregnancy.  Let us know if you need anything.

## 2018-06-23 NOTE — Progress Notes (Signed)
Chief Complaint  Patient presents with  . Follow-up    blood clot, ankles still swelling, started Xarelto    Subjective: Patient is a 57 y.o. female here for DVT f/u.  Recently had cerv laminectomy surgery and on follow up had a LE doppler that showed a R sided DVT. NS called our office to manage her anticoagulation. She was placed on starter pack of Xarelto. Has had 4-5 clots in LE's. First was 2/2 pregnancy. Unsure exactly why she had the next ones, but thinks it was due to a medication.  ROS: Heart: Denies chest pain  Lungs: Denies SOB   Past Medical History:  Diagnosis Date  . Back injury    Chronic  . Bronchitis   . Cardiomyopathy (HCC)    hypertension controlled-helped with edema-Dr Nahser  . Chest pain    Normal heart catherization  . Collapsed lung   . Deafness    Left ear  . Degenerative joint disease    with neck surgery  . Depression   . Diastolic dysfunction    with possible mild LVOT gradient  . Dysrhythmia   . Family history of adverse reaction to anesthesia    " my daughter has PONV and gets very grumpy"  . Fibromyalgia   . Former cigarette smoker   . Gestational diabetes   . H/O blood clots    due to side effect of medication  . Headache(784.0)    migraines  . Hypertension   . Leg cramps   . Neuropathy   . Pain    chronic   . Pneumonia   . Pneumothorax   . PTSD (post-traumatic stress disorder)   . Respiratory disorder     Objective: BP 126/70 (BP Location: Left Arm, Patient Position: Sitting, Cuff Size: Small)   Pulse 100   Temp 98 F (36.7 C) (Oral)   Resp 16   Ht 5' 5.5" (1.664 m)   Wt 175 lb 8 oz (79.6 kg)   SpO2 97%   BMI 28.76 kg/m  General: Awake, appears stated age HEENT: MMM, EOMi Heart: RRR (HR 96 on my exam), no murmurs, no LE edema MSK: No ttp on L, +TTP over prox medial calf on R and medial knee where hamstring crosses jt Lungs: CTAB, no rales, wheezes or rhonchi. No accessory muscle use Psych: Age appropriate judgment  and insight, normal affect and mood  Assessment and Plan: Acute deep vein thrombosis (DVT) of proximal vein of right lower extremity (HCC)  Cont Xarelto, payment card. Will see why she had DVT's after her pregnancy. If they were all provoked, will discuss risks and benefits of future anti-coagulation. I fear that she may need lifelong given her hx. Records release form filled out for her prior clinic.  She will also check her personal records. F/u in 12 weeks.  The patient voiced understanding and agreement to the plan.  Jilda Roche Newton, DO 06/23/18  4:32 PM

## 2018-06-23 NOTE — Progress Notes (Signed)
Pre visit review using our clinic review tool, if applicable. No additional management support is needed unless otherwise documented below in the visit note. 

## 2018-07-04 ENCOUNTER — Other Ambulatory Visit: Payer: Self-pay | Admitting: Family Medicine

## 2018-07-04 DIAGNOSIS — M545 Low back pain, unspecified: Secondary | ICD-10-CM

## 2018-07-13 ENCOUNTER — Telehealth: Payer: Self-pay | Admitting: *Deleted

## 2018-07-13 NOTE — Telephone Encounter (Signed)
Request for Medical Records Denied, d/t no medical records to return; forwarded to provider/SLS 11/20

## 2018-07-14 ENCOUNTER — Telehealth: Payer: Self-pay | Admitting: Family Medicine

## 2018-07-14 NOTE — Telephone Encounter (Signed)
Called the patient informed we have just 2 bottles for samples for her. Will put at the front for pickup. Lot#[id number] Exp date 11/2019 # 2 bottles with #7 in each bottle. The patient was informed to go on line to [employer]arel[employer] to complete form for coupon.  She did verbalize understanding.

## 2018-07-14 NOTE — Telephone Encounter (Signed)
Copied from CRM [id number]. Topic: General - Other >> Jul 14, 2018  4:04 PM Stephannie LiSimmons, Kinda Pottle L, NT wrote: Reason for CRM: Patient  called to request more samples of xarelto  if possible , please call her at please call her at [phone],

## 2018-07-14 NOTE — Telephone Encounter (Signed)
OK to give samples of 20 mg/d options. If payment card was not helpful, we need to reach out to rep. Who can do this? TY.

## 2018-07-14 NOTE — Telephone Encounter (Signed)
Advise if ok to give samples. She has been given a starter pack on 06/22/2018

## 2018-07-19 ENCOUNTER — Encounter: Payer: Self-pay | Admitting: Family Medicine

## 2018-07-19 ENCOUNTER — Ambulatory Visit: Payer: BLUE CROSS/BLUE SHIELD | Admitting: Family Medicine

## 2018-07-19 VITALS — BP 128/70 | HR 63 | Temp 98.1°F | Ht 65.0 in | Wt 175.4 lb

## 2018-07-19 DIAGNOSIS — R600 Localized edema: Secondary | ICD-10-CM | POA: Diagnosis not present

## 2018-07-19 MED ORDER — BUSPIRONE HCL 10 MG PO TABS: 10.0000 mg | ORAL_TABLET | Freq: Three times a day (TID) | ORAL | 1 refills | Status: DC

## 2018-07-19 NOTE — Patient Instructions (Addendum)
Mind salt intake, stay active, wear compression stockings and elevate legs.  Give us 2-3 business days to get the results of your labs back.   Let us know if you need anything.

## 2018-07-19 NOTE — Progress Notes (Signed)
Chief Complaint  Patient presents with  . Edema    Beverly GustHeather A Mcclenton here for bilateral leg swelling.  Duration: 1.5 weeks Hx of prolonged bedrest, recent surgery, travel or injury? No Pain the calf? Yes SOB? Yes Personal or family history of clot or bleeding disorder? Yes- currently taking Xarelto Hx of heart failure, renal failure, hepatic failure? No  ROS:  MSK- +leg swelling, no pain Lungs- no SOB  Past Medical History:  Diagnosis Date  . Back injury    Chronic  . Bronchitis   . Cardiomyopathy (HCC)    hypertension controlled-helped with edema-Dr Nahser  . Chest pain    Normal heart catherization  . Collapsed lung   . Deafness    Left ear  . Degenerative joint disease    with neck surgery  . Depression   . Diastolic dysfunction    with possible mild LVOT gradient  . Dysrhythmia   . Family history of adverse reaction to anesthesia    " my daughter has PONV and gets very grumpy"  . Fibromyalgia   . Former cigarette smoker   . Gestational diabetes   . H/O blood clots    due to side effect of medication  . Headache(784.0)    migraines  . Hypertension   . Leg cramps   . Neuropathy   . Pain    chronic   . Pneumonia   . Pneumothorax   . PTSD (post-traumatic stress disorder)   . Respiratory disorder    BP 128/70 (BP Location: Left Arm, Patient Position: Sitting, Cuff Size: Normal)   Pulse 63   Temp 98.1 F (36.7 C) (Oral)   Ht 5\' 5"  (1.651 m)   Wt 175 lb 6 oz (79.5 kg)   SpO2 95%   BMI 29.18 kg/m  Gen- awake, alert, appears stated age Heart- RRR, no murmurs, 2+BLE edema tapering to knees Lungs- CTAB, normal effort w/o accessory muscle use MSK- +b/l diffuse LE pain Psych: Age appropriate judgment and insight  Bilateral lower extremity edema - Plan: Comprehensive metabolic panel, TSH  Orders as above. Compression stockings (rx given), elevate legs, stay active, mind salt intake. F/u prn. Pt voiced understanding and agreement to the  plan.  Jilda Rocheicholas Paul GallatinWendling, DO 07/19/18  11:56 AM

## 2018-07-19 NOTE — Progress Notes (Signed)
Pre visit review using our clinic review tool, if applicable. No additional management support is needed unless otherwise documented below in the visit note. 

## 2018-08-01 ENCOUNTER — Telehealth: Payer: Self-pay

## 2018-08-01 NOTE — Telephone Encounter (Signed)
Called the Pharm Rep---Ann Spell at [phone]--left a detailed message requesting more samples//possible coupons/or payment assistant.

## 2018-08-01 NOTE — Telephone Encounter (Signed)
Copied from CRM [id number]. Topic: General - Other >> Aug 01, 2018  2:47 PM Lynne LoganHudson, Caryn D wrote: Reason for CRM:Pt will stop in on Wednesday 08/03/18 to pick up Xarelto samples.

## 2018-08-01 NOTE — Telephone Encounter (Signed)
a 

## 2018-08-01 NOTE — Telephone Encounter (Signed)
Pt. calling [facility], asking for additional samples of 20mg  xarelto. Author told [facility] to let pt. Know that we only have one bottle (7 tabs) available at this time, and we can give that to her, but she needs to still go online for coupons, as we cannot indefinitely provider her samples. Routed to Dr. Carmelia RollerWendling to further advise and perhaps consider alternative, cheaper therapy.

## 2018-08-01 NOTE — Telephone Encounter (Signed)
Can we reach out to pharmacy rep for more samples and possible payment assistance? TY.

## 2018-08-02 ENCOUNTER — Other Ambulatory Visit: Payer: Self-pay | Admitting: Family Medicine

## 2018-08-02 DIAGNOSIS — M545 Low back pain, unspecified: Secondary | ICD-10-CM

## 2018-08-03 NOTE — Telephone Encounter (Signed)
Patient was given #4 xarelto 20 mg #7 each bottle. Also was given coupon card to use for prescription in the future

## 2018-08-03 NOTE — Addendum Note (Signed)
Addended by: Mervin KungFERGERSON, Jairus Tonne A on: 08/03/2018 04:00 PM   Modules accepted: Orders

## 2018-08-04 ENCOUNTER — Other Ambulatory Visit: Payer: Self-pay | Admitting: Family Medicine

## 2018-08-04 ENCOUNTER — Other Ambulatory Visit (INDEPENDENT_AMBULATORY_CARE_PROVIDER_SITE_OTHER): Payer: BLUE CROSS/BLUE SHIELD

## 2018-08-04 DIAGNOSIS — R600 Localized edema: Secondary | ICD-10-CM | POA: Diagnosis not present

## 2018-08-04 DIAGNOSIS — R7989 Other specified abnormal findings of blood chemistry: Secondary | ICD-10-CM

## 2018-08-04 DIAGNOSIS — E875 Hyperkalemia: Secondary | ICD-10-CM

## 2018-08-04 LAB — COMPREHENSIVE METABOLIC PANEL
ALT: 13 U/L (ref 0–35)
AST: 18 U/L (ref 0–37)
Albumin: 4 g/dL (ref 3.5–5.2)
Alkaline Phosphatase: 72 U/L (ref 39–117)
BUN: 11 mg/dL (ref 6–23)
CO2: 26 mEq/L (ref 19–32)
Calcium: 9.6 mg/dL (ref 8.4–10.5)
Chloride: 103 mEq/L (ref 96–112)
Creatinine, Ser: 0.94 mg/dL (ref 0.40–1.20)
GFR: 65.51 mL/min (ref >60.00)
Glucose, Bld: 129 mg/dL — ABNORMAL HIGH (ref 70–99)
Potassium: 5.9 mEq/L — ABNORMAL HIGH (ref 3.5–5.1)
Sodium: 139 mEq/L (ref 135–145)
Total Bilirubin: 0.3 mg/dL (ref 0.2–1.2)
Total Protein: 6.6 g/dL (ref 6.0–8.3)

## 2018-08-04 LAB — TSH: TSH: 13.67 u[IU]/mL — ABNORMAL HIGH (ref 0.35–4.50)

## 2018-08-05 ENCOUNTER — Other Ambulatory Visit: Payer: BLUE CROSS/BLUE SHIELD

## 2018-08-08 ENCOUNTER — Telehealth: Payer: Self-pay | Admitting: Family Medicine

## 2018-08-08 ENCOUNTER — Other Ambulatory Visit (INDEPENDENT_AMBULATORY_CARE_PROVIDER_SITE_OTHER): Payer: BLUE CROSS/BLUE SHIELD

## 2018-08-08 ENCOUNTER — Other Ambulatory Visit: Payer: Self-pay | Admitting: Family Medicine

## 2018-08-08 DIAGNOSIS — E875 Hyperkalemia: Secondary | ICD-10-CM | POA: Diagnosis not present

## 2018-08-08 DIAGNOSIS — R7989 Other specified abnormal findings of blood chemistry: Secondary | ICD-10-CM

## 2018-08-08 LAB — BASIC METABOLIC PANEL
BUN: 8 mg/dL (ref 6–23)
CO2: 27 mEq/L (ref 19–32)
Calcium: 8.7 mg/dL (ref 8.4–10.5)
Chloride: 98 mEq/L (ref 96–112)
Creatinine, Ser: 0.73 mg/dL (ref 0.40–1.20)
GFR: 87.71 mL/min (ref >60.00)
Glucose, Bld: 87 mg/dL (ref 70–99)
Potassium: 5.3 mEq/L — ABNORMAL HIGH (ref 3.5–5.1)
Sodium: 130 mEq/L — ABNORMAL LOW (ref 135–145)

## 2018-08-08 LAB — T4, FREE: Free T4: 0.53 ng/dL — ABNORMAL LOW (ref 0.60–1.60)

## 2018-08-08 MED ORDER — SODIUM POLYSTYRENE SULFONATE 15 GM/60ML PO SUSP: 30.0000 g | Freq: Every day | ORAL | 0 refills | Status: DC

## 2018-08-08 NOTE — Telephone Encounter (Signed)
Copied from CRM [id number]. Topic: Quick Communication - See Telephone Encounter >> Aug 08, 2018  4:39 PM Jens SomMedley, Jennifer A wrote: CRM for notification. See Telephone encounter for: 08/08/18.  Patient is calling back for lab results Please advise

## 2018-08-08 NOTE — Telephone Encounter (Signed)
Pt given lab results and documented in result note.  

## 2018-08-10 ENCOUNTER — Telehealth: Payer: Self-pay | Admitting: Family Medicine

## 2018-08-10 MED ORDER — SODIUM POLYSTYRENE SULFONATE 15 GM/60ML PO SUSP: 30.0000 g | Freq: Every day | ORAL | 0 refills | Status: AC

## 2018-08-10 MED ORDER — LEVOTHYROXINE SODIUM 50 MCG PO TABS: 50.0000 ug | ORAL_TABLET | Freq: Every day | ORAL | 3 refills | Status: DC

## 2018-08-10 NOTE — Telephone Encounter (Signed)
Copied from CRM [id number]. Topic: Quick Communication - Rx Refill/Question >> Aug 10, 2018  2:02 PM Jilda Rocheemaray, Melissa wrote: Medication: sodium polystyrene (KAYEXALATE) 15 GM/60ML suspension [[id number]][[id number]]   Has the patient contacted their pharmacy? Yes.  [facility] states that this is not available and he is not able to compound it, please advise (Agent: If no, request that the patient contact the pharmacy for the refill.) (Agent: If yes, when and what did the pharmacy advise?)  Preferred Pharmacy (with phone number or street name):   Agent: Please be advised that RX refills may take up to 3 business days. We ask that you follow-up with your pharmacy.

## 2018-08-10 NOTE — Telephone Encounter (Signed)
Copied from CRM [id number]. Topic: Quick Communication - See Telephone Encounter >> Aug 10, 2018 10:15 AM Darletta MollLander, Lumin L wrote: Reason for TW: Patient never got potassium and something for her low thyroid levels. Please advise. Also needs to know if she needs to keep lab appointment on Monday the 23rd?

## 2018-08-10 NOTE — Telephone Encounter (Signed)
Patient is calling to request to update on her medication. She is requesting a call back once completed. Please advise thank you

## 2018-08-10 NOTE — Telephone Encounter (Signed)
[facility] the [facility] and they do not have it either

## 2018-08-10 NOTE — Telephone Encounter (Signed)
Resent. Keep appt. TY.

## 2018-08-10 NOTE — Telephone Encounter (Signed)
Do they have any formulation of this? Does downstairs have it?

## 2018-08-11 MED FILL — SPS 15 GM/60 ML SUSPENSION: 15 | 3 days supply | Qty: 360 | Fill #0

## 2018-08-11 NOTE — Telephone Encounter (Signed)
Pt calling to find out if there is any update on this.  Pt states that she had bad spasms last night and this morning they are still continuing in her hand.  Pt want to know if she needs to get something OTC in the meantime.

## 2018-08-11 NOTE — Telephone Encounter (Signed)
Does anyone in area have it? Usually someone knows what branch has it?

## 2018-08-11 NOTE — Telephone Encounter (Signed)
Called [facility] on [address] They have it and are getting it ready for the patient to pickup. Gave them a verbal refill. Called the patient gave her the address to the pharmacy//telephone number. She was very pleased we did find the medication.

## 2018-08-11 NOTE — Telephone Encounter (Signed)
I called the pharmacy downstairs and they do not have any idea who would have it.

## 2018-08-15 ENCOUNTER — Other Ambulatory Visit (INDEPENDENT_AMBULATORY_CARE_PROVIDER_SITE_OTHER): Payer: BLUE CROSS/BLUE SHIELD

## 2018-08-15 ENCOUNTER — Other Ambulatory Visit: Payer: Self-pay | Admitting: Family Medicine

## 2018-08-15 DIAGNOSIS — E875 Hyperkalemia: Secondary | ICD-10-CM | POA: Diagnosis not present

## 2018-08-15 LAB — BASIC METABOLIC PANEL
BUN: 7 mg/dL (ref 6–23)
CO2: 28 mEq/L (ref 19–32)
Calcium: 8.5 mg/dL (ref 8.4–10.5)
Chloride: 91 mEq/L — ABNORMAL LOW (ref 96–112)
Creatinine, Ser: 0.75 mg/dL (ref 0.40–1.20)
GFR: 85.01 mL/min (ref >60.00)
Glucose, Bld: 119 mg/dL — ABNORMAL HIGH (ref 70–99)
Potassium: 3.3 mEq/L — ABNORMAL LOW (ref 3.5–5.1)
Sodium: 127 mEq/L — ABNORMAL LOW (ref 135–145)

## 2018-08-15 NOTE — Progress Notes (Signed)
bmp 

## 2018-08-22 ENCOUNTER — Other Ambulatory Visit: Payer: Self-pay | Admitting: Family Medicine

## 2018-08-22 ENCOUNTER — Ambulatory Visit (INDEPENDENT_AMBULATORY_CARE_PROVIDER_SITE_OTHER): Payer: BLUE CROSS/BLUE SHIELD | Admitting: Family Medicine

## 2018-08-22 ENCOUNTER — Ambulatory Visit: Payer: Self-pay | Admitting: *Deleted

## 2018-08-22 ENCOUNTER — Encounter: Payer: Self-pay | Admitting: Family Medicine

## 2018-08-22 ENCOUNTER — Other Ambulatory Visit: Payer: BLUE CROSS/BLUE SHIELD

## 2018-08-22 ENCOUNTER — Other Ambulatory Visit: Payer: Self-pay | Admitting: Neurological Surgery

## 2018-08-22 VITALS — BP 128/70 | HR 57 | Temp 98.3°F | Ht 65.0 in | Wt 168.0 lb

## 2018-08-22 DIAGNOSIS — F411 Generalized anxiety disorder: Secondary | ICD-10-CM

## 2018-08-22 DIAGNOSIS — E871 Hypo-osmolality and hyponatremia: Secondary | ICD-10-CM

## 2018-08-22 DIAGNOSIS — M5412 Radiculopathy, cervical region: Secondary | ICD-10-CM

## 2018-08-22 DIAGNOSIS — J0100 Acute maxillary sinusitis, unspecified: Secondary | ICD-10-CM | POA: Diagnosis not present

## 2018-08-22 DIAGNOSIS — E875 Hyperkalemia: Secondary | ICD-10-CM

## 2018-08-22 DIAGNOSIS — H6983 Other specified disorders of Eustachian tube, bilateral: Secondary | ICD-10-CM | POA: Diagnosis not present

## 2018-08-22 DIAGNOSIS — L959 Vasculitis limited to the skin, unspecified: Secondary | ICD-10-CM | POA: Diagnosis not present

## 2018-08-22 LAB — BASIC METABOLIC PANEL
BUN: 10 mg/dL (ref 6–23)
CO2: 27 mEq/L (ref 19–32)
Calcium: 9 mg/dL (ref 8.4–10.5)
Chloride: 92 mEq/L — ABNORMAL LOW (ref 96–112)
Creatinine, Ser: 0.9 mg/dL (ref 0.40–1.20)
GFR: 68.87 mL/min (ref >60.00)
Glucose, Bld: 98 mg/dL (ref 70–99)
Potassium: 4.8 mEq/L (ref 3.5–5.1)
Sodium: 126 mEq/L — ABNORMAL LOW (ref 135–145)

## 2018-08-22 MED ORDER — BUSPIRONE HCL 15 MG PO TABS: 15.0000 mg | ORAL_TABLET | Freq: Three times a day (TID) | ORAL | 3 refills | Status: DC

## 2018-08-22 MED ORDER — PREDNISONE 50 MG PO TABS: ORAL_TABLET | ORAL | 0 refills | Status: DC

## 2018-08-22 MED ORDER — IBUPROFEN 800 MG PO TABS: 800.0000 mg | ORAL_TABLET | Freq: Three times a day (TID) | ORAL | 0 refills | Status: DC | PRN

## 2018-08-22 NOTE — Patient Instructions (Addendum)
Colace- generic is docusate sodium  Use ibuprofen VERY sparingly! This increases your risk of bleeding.  Heat (pad or rice pillow in microwave) over affected area, 10-15 minutes twice daily.   Call in 1 week if you are no improvement with your sinuses.  Call your ENT today for an appointment!  Give us 2-3 business days to get the results of your labs back.   Let us know if you need anything.

## 2018-08-22 NOTE — Telephone Encounter (Signed)
Patient called to say that she is not feeling well she has a cough among other symptoms of a cold. Patient complains of having rash break out on legs stated that she had it before and was prescribed Prednisone to clear it up. Patient is requesting a call back from Dr Carmelia RollerWendling nurse to see if she need to be seen or can he just prescribe some medication for her. Also stated that she will be seeing the neurologist for recent falls. Patient want to know if she should still come in for blood work at 2.00 pm since she is not feeling well. CB# [phone]  Call to patient- patient states she is having a reoccurrence of rash- it started of feet and is now spreading up her legs. Patient states she is unable to wear her compression stocking due to the pain with the rash. Call to office- they request note for review and possible work in appointment.  Reason for Disposition . SEVERE itching (i.e., interferes with sleep, normal activities or school)    Patient is complaining of pain and burning with rash  Answer Assessment - Initial Assessment Questions 1. APPEARANCE of RASH: "Describe the rash." (e.g., spots, blisters, raised areas, skin peeling, scaly)     Rash- red rash- blotches, splits between toes now 2. SIZE: "How big are the spots?" (e.g., tip of pen, eraser, coin; inches, centimeters)     Pinpoint like before 3. LOCATION: "Where is the rash located?"     Both feet- up R and L calf 4. COLOR: "What color is the rash?" (Note: It is difficult to assess rash color in people with darker-colored skin. When this situation occurs, simply ask the caller to describe what they see.)     red 5. ONSET: "When did the rash begin?"     Started right before stared K+ and thyroid  6. FEVER: "Do you have a fever?" If so, ask: "What is your temperature, how was it measured, and when did it start?"     No fever 7. ITCHING: "Does the rash itch?" If so, ask: "How bad is the itch?" (Scale 1-10; or mild, moderate, severe)     No itching- burns and painful 8. CAUSE: "What do you think is causing the rash?"     unknown 9. MEDICATION FACTORS: "Have you started any new medications within the last 2 weeks?" (e.g., antibiotics)      Potasium and thyroid medication  10. OTHER SYMPTOMS: "Do you have any other symptoms?" (e.g., dizziness, headache, sore throat, joint pain)       Headaches, wobbly at times, fell again yesterday- legs just gave out 11. PREGNANCY: "Is there any chance you are pregnant?" "When was your last menstrual period?"       n/a  Protocols used: RASH OR REDNESS - [facility]

## 2018-08-22 NOTE — Progress Notes (Signed)
Chief Complaint  Patient presents with  . Rash    [name] is a 57 y.o. female here for a skin complaint.  Duration: 1.5 weeks Location: legs Pruritic? No Painful? Yes Drainage? No New soaps/lotions/topicals/detergents? No Therapies tried thus far: none; dx'd w vasculitis in past  Dizzy, congestion, dental pain, sinus pain for 2 d. No fevers, ST, cough, SOB, myalgias has not tried anything at home. No sick contacts.  GAD Would like to increase dose of BuSpar. It is helping overall. Taking 10 mg tid. No AE's.  ROS:  Const: No fevers Skin: As noted in HPI  Past Medical History:  Diagnosis Date  . Back injury    Chronic  . Bronchitis   . Cardiomyopathy (HCC)    hypertension controlled-helped with edema-Dr Nahser  . Chest pain    Normal heart catherization  . Collapsed lung   . Deafness    Left ear  . Degenerative joint disease    with neck surgery  . Depression   . Diastolic dysfunction    with possible mild LVOT gradient  . Dysrhythmia   . Family history of adverse reaction to anesthesia    " my daughter has PONV and gets very grumpy"  . Fibromyalgia   . Former cigarette smoker   . Gestational diabetes   . H/O blood clots    due to side effect of medication  . Headache(784.0)    migraines  . Hypertension   . Leg cramps   . Neuropathy   . Pain    chronic   . Pneumonia   . Pneumothorax   . PTSD (post-traumatic stress disorder)   . Respiratory disorder     BP 128/70 (BP Location: Left Arm, Patient Position: Sitting, Cuff Size: Normal)   Pulse (!) 57   Temp 98.3 F (36.8 C) (Oral)   Ht 5\' 5"  (1.651 m)   Wt 168 lb (76.2 kg)   SpO2 97%   BMI 27.96 kg/m  Gen: awake, alert, appearing stated age Lungs: No accessory muscle use. CTAB Heart: RRR, no LE edema HEENT: Ears patent, retracted TM's b/l, no fluid, Nares, neg, PERRLA, MMM, no erythema or exudate Skin: see below. No drainage, erythema, TTP, fluctuance, excoriation Psych: Age appropriate  judgment and insight        Vasculitis of skin - Plan: predniSONE (DELTASONE) 50 MG tablet  Dysfunction of both eustachian tubes - Plan: predniSONE (DELTASONE) 50 MG tablet  Acute maxillary sinusitis, recurrence not specified - Plan: ibuprofen (ADVIL,MOTRIN) 800 MG tablet  Hyperkalemia - Plan: Basic metabolic panel  GAD (generalized anxiety disorder) - Plan: busPIRone (BUSPAR) 15 MG tablet  Orders as above. F/u prn. The patient voiced understanding and agreement to the plan.  Jilda Rocheicholas Paul HornbeckWendling, DO 08/22/18 12:30 PM

## 2018-08-22 NOTE — Telephone Encounter (Signed)
See if she can come at 1145. TY.

## 2018-08-22 NOTE — Progress Notes (Signed)
Pre visit review using our clinic review tool, if applicable. No additional management support is needed unless otherwise documented below in the visit note. 

## 2018-08-22 NOTE — Telephone Encounter (Signed)
Author phoned pt, appointment made for 1145AM with PCP.

## 2018-08-25 ENCOUNTER — Other Ambulatory Visit: Payer: Self-pay | Admitting: Neurological Surgery

## 2018-08-26 ENCOUNTER — Ambulatory Visit: Payer: Self-pay

## 2018-08-26 MED ORDER — PREDNISONE 20 MG PO TABS: ORAL_TABLET | ORAL | 0 refills | Status: DC

## 2018-08-26 NOTE — Telephone Encounter (Signed)
Patient informed. 

## 2018-08-26 NOTE — Telephone Encounter (Signed)
Pt. Calling to report that the Prednisone has helped - she is requesting a refill if possible. Would also like medication for her cough.Still has some swelling up to both calves and red spots. Reports she is scheduled for her CT Scan this Monday. Also wants to know when she needs to return to see her provider.Pt. uses [facility] in [place]. Please advise pt.  Answer Assessment - Initial Assessment Questions 1. ONSET: "When did the swelling start?" (e.g., minutes, hours, days)     Started over a month ago 2. LOCATION: "What part of the leg is swollen?"  "Are both legs swollen or just one leg?"     Both legs  3. SEVERITY: "How bad is the swelling?" (e.g., localized; mild, moderate, severe)  - Localized - small area of swelling localized to one leg  - MILD pedal edema - swelling limited to foot and ankle, pitting edema < 1/4 inch (6 mm) deep, rest and elevation eliminate most or all swelling  - MODERATE edema - swelling of lower leg to knee, pitting edema > 1/4 inch (6 mm) deep, rest and elevation only partially reduce swelling  - SEVERE edema - swelling extends above knee, facial or hand swelling present      mILD 4. REDNESS: "Does the swelling look red or infected?"      Still has redness 5. PAIN: "Is the swelling painful to touch?" If so, ask: "How painful is it?"   (Scale 1-10; mild, moderate or severe)     5 6. FEVER: "Do you have a fever?" If so, ask: "What is it, how was it measured, and when did it start?"      No 7. CAUSE: "What do you think is causing the leg swelling?"     Vasculitis 8. MEDICAL HISTORY: "Do you have a history of heart failure, kidney disease, liver failure, or cancer?"     Blood clots in both legs 9. RECURRENT SYMPTOM: "Have you had leg swelling before?" If so, ask: "When was the last time?" "What happened that time?"     Yes 10. OTHER SYMPTOMS: "Do you have any other symptoms?" (e.g., chest pain, difficulty breathing)       Shortness of breath because of  coughing 11. PREGNANCY: "Is there any chance you are pregnant?" "When was your last menstrual period?"       No  Protocols used: LEG SWELLING AND EDEMA-A-AH

## 2018-08-26 NOTE — Telephone Encounter (Signed)
Sent in taper for 2 weeks. She does not need to see me. If her skin doesn't get better, may need to f/u with derm again though. Swelling could be side effect from steroid. TY.

## 2018-08-29 ENCOUNTER — Telehealth: Payer: Self-pay | Admitting: *Deleted

## 2018-08-29 ENCOUNTER — Ambulatory Visit
Admission: RE | Admit: 2018-08-29 | Discharge: 2018-08-29 | Disposition: A | Discharge status: Home / Self Care | Payer: BLUE CROSS/BLUE SHIELD | Source: Ambulatory Visit | Attending: Neurological Surgery | Admitting: Neurological Surgery

## 2018-08-29 DIAGNOSIS — M5412 Radiculopathy, cervical region: Secondary | ICD-10-CM

## 2018-08-29 DIAGNOSIS — M5032 Other cervical disc degeneration, mid-cervical region, unspecified level: Secondary | ICD-10-CM | POA: Diagnosis not present

## 2018-08-29 NOTE — Telephone Encounter (Signed)
Canceled tomorrow's appt///called the patient and rescheduled for 09/26/18 per PCP instructions for 1 month followup BMP.

## 2018-08-29 NOTE — Telephone Encounter (Signed)
Per 08/22/18 lab result note, pt should repeat basic metabolic panel in 1 month but pt is on the lab schedule for a bmp this Tuesday. Please call pt to clarify when lab appointment is needed.

## 2018-08-30 ENCOUNTER — Other Ambulatory Visit: Payer: BLUE CROSS/BLUE SHIELD

## 2018-09-02 ENCOUNTER — Other Ambulatory Visit: Payer: Self-pay | Admitting: Family Medicine

## 2018-09-22 ENCOUNTER — Telehealth: Payer: Self-pay | Admitting: Family Medicine

## 2018-09-22 MED ORDER — NAPROXEN 500 MG PO TABS: 500.0000 mg | ORAL_TABLET | Freq: Two times a day (BID) | ORAL | 0 refills | Status: DC

## 2018-09-22 NOTE — Telephone Encounter (Signed)
Copied from CRM [id number]. Topic: Quick Communication - See Telephone Encounter >> Sep 22, 2018  9:34 AM Trula SladeWalter, Linda F wrote: CRM for notification. See Telephone encounter for: 09/22/18. Patient would like a refill on her Naproxen 500mg  tablets medication and have [facility] [place] [address]. Patient stated she is completely out of medication and her legs are bruising.

## 2018-09-22 NOTE — Telephone Encounter (Signed)
Naproxen sent. She had been counseled on judicious use of NSAIDs given she is on anticoag.

## 2018-09-22 NOTE — Addendum Note (Signed)
Addended by: Radene Gunning on: 09/22/2018 11:13 AM   Modules accepted: Orders

## 2018-09-22 NOTE — Telephone Encounter (Signed)
Refill request for naproxen; medication d/c'd 07/19/18 per Dr Carmelia Roller; contacted pt and she states that the ibuprofen is not working for swelling and pain; [facility] [place] [place] will route to office for final disposition.  Requested medication (s) are due for refill today: naproxen, no   Requested medication (s) are on the active medication list: no  Last refill:  D/C/d 07/19/18  Future visit scheduled: 09/26/2018 with Dr Carmelia Roller  Notes to clinic:  Pt states ibuprofen is not working for pain and swelling

## 2018-09-26 ENCOUNTER — Encounter: Payer: Self-pay | Admitting: Family Medicine

## 2018-09-26 ENCOUNTER — Ambulatory Visit: Payer: BLUE CROSS/BLUE SHIELD | Admitting: Family Medicine

## 2018-09-26 ENCOUNTER — Other Ambulatory Visit: Payer: BLUE CROSS/BLUE SHIELD

## 2018-09-26 ENCOUNTER — Telehealth: Payer: Self-pay | Admitting: Family Medicine

## 2018-09-26 VITALS — BP 122/80 | HR 65 | Temp 97.9°F | Ht 65.0 in | Wt 163.4 lb

## 2018-09-26 DIAGNOSIS — R42 Dizziness and giddiness: Secondary | ICD-10-CM

## 2018-09-26 DIAGNOSIS — F410 Panic disorder [episodic paroxysmal anxiety] without agoraphobia: Secondary | ICD-10-CM

## 2018-09-26 MED ORDER — HYDROXYZINE HCL 25 MG PO TABS: ORAL_TABLET | ORAL | 0 refills | Status: DC

## 2018-09-26 MED ORDER — NAPROXEN 500 MG PO TABS: ORAL_TABLET | ORAL | 0 refills | Status: DC

## 2018-09-26 MED ORDER — MECLIZINE HCL 25 MG PO TABS: 25.0000 mg | ORAL_TABLET | Freq: Three times a day (TID) | ORAL | 0 refills | Status: DC | PRN

## 2018-09-26 NOTE — Progress Notes (Signed)
Chief Complaint  Patient presents with  . Dizziness    [name] is 58 y.o. pt here for dizziness. Here w husband.  Duration: 4 days Pass out? No Spinning? Yes Recent illness/fever? No Neurologic signs? No Change in PO intake? No  Recently had Zanaflex dosing changed form caps to tabs 2/2 ins.   ROS:  Neuro: As noted in HPI Eyes: No vision changes  Past Medical History:  Diagnosis Date  . Back injury    Chronic  . Bronchitis   . Cardiomyopathy (HCC)    hypertension controlled-helped with edema-Dr Nahser  . Chest pain    Normal heart catherization  . Collapsed lung   . Deafness    Left ear  . Degenerative joint disease    with neck surgery  . Depression   . Diastolic dysfunction    with possible mild LVOT gradient  . Dysrhythmia   . Family history of adverse reaction to anesthesia    " my daughter has PONV and gets very grumpy"  . Fibromyalgia   . Former cigarette smoker   . Gestational diabetes   . H/O blood clots    due to side effect of medication  . Headache(784.0)    migraines  . Hypertension   . Leg cramps   . Neuropathy   . Pain    chronic   . Pneumonia   . Pneumothorax   . PTSD (post-traumatic stress disorder)   . Respiratory disorder     Family History  Problem Relation Age of Onset  . Coronary artery disease Father   . Parkinson's disease Mother   . Cancer Sister        breast  . Diabetes Sister   . Diabetes Brother       BP 122/80 (BP Location: Left Arm, Patient Position: Sitting, Cuff Size: Normal)   Pulse 65   Temp 97.9 F (36.6 C)   Ht 5\' 5"  (1.651 m)   Wt 163 lb 6 oz (74.1 kg)   SpO2 96%   BMI 27.19 kg/m  General: Awake, alert, appears stated age Eyes: PERRLA, EOMi Ears: Patent, TM's neg b/l Heart:  no carotid bruits Lungs: no accessory muscle use MSK: Equal strength throughout Neuro: No cerebellar signs, patellar reflex 3/4 b/l wo clonus, calcaneal reflex 2/4 b/l wo clonus, biceps reflex 2/4 b/l wo clonus;  unable to perform Dix-Hallpike secondary to chronic neck/back pain.  She did report resumption of symptoms when turning her head sitting. Psych: Age appropriate judgment and insight, normal mood and affect  Vertigo - Plan: meclizine (ANTIVERT) 25 MG tablet  Meclizine as needed.  While I do not think that the change from capsules to tablets is the exact cause of this, I think it is low risk to inquire about changing back to capsules.  Epley maneuvers discussed and provided.  YouTube if poor understanding of instructions.  Send me a MyChart message in 2 days if not improving and we will set up with vestibular rehab. F/u prn. Pt voiced understanding and agreement to the plan.  Jilda Roche [place], DO 09/26/18 12:04 PM

## 2018-09-26 NOTE — Telephone Encounter (Signed)
Copied from CRM [id number]. Topic: Quick Communication - See Telephone Encounter >> Sep 26, 2018  4:21 PM Arlyss Gandy, NT wrote: CRM for notification. See Telephone encounter for: 09/26/18. Pt is needing the meclizine (ANTIVERT) 25 MG tablet and the naproxen (NAPROSYN) 500 MG tablet [id number] [place] [place] - [address] [phone] (Phone) [phone] (Fax). Pt also states she forgot to mention to refill her hydrOXYzine (ATARAX/VISTARIL) 25 MG tablet.

## 2018-09-26 NOTE — Progress Notes (Signed)
Pre visit review using our clinic review tool, if applicable. No additional management support is needed unless otherwise documented below in the visit note. 

## 2018-09-26 NOTE — Telephone Encounter (Signed)
Sent in prescriptions to the [facility]. [facility] [place]. Called the patient informed scripts sent in.

## 2018-09-26 NOTE — Patient Instructions (Addendum)
How to Perform the Epley Maneuver The Epley maneuver is an exercise that relieves symptoms of vertigo. Vertigo is the feeling that you or your surroundings are moving when they are not. When you feel vertigo, you may feel like the room is spinning and have trouble walking. Dizziness is a little different than vertigo. When you are dizzy, you may feel unsteady or light-headed. You can do this maneuver at home whenever you have symptoms of vertigo. You can do it up to 3 times a day until your symptoms go away. Even though the Epley maneuver may relieve your vertigo for a few weeks, it is possible that your symptoms will return. This maneuver relieves vertigo, but it does not relieve dizziness. What are the risks? If it is done correctly, the Epley maneuver is considered safe. Sometimes it can lead to dizziness or nausea that goes away after a short time. If you develop other symptoms, such as changes in vision, weakness, or numbness, stop doing the maneuver and call your health care provider. How to perform the Epley maneuver 1. Sit on the edge of a bed or table with your back straight and your legs extended or hanging over the edge of the bed or table. 2. Turn your head halfway toward the affected ear or side. 3. Lie backward quickly with your head turned until you are lying flat on your back. You may want to position a pillow under your shoulders. 4. Hold this position for 30 seconds. You may experience an attack of vertigo. This is normal. 5. Turn your head to the opposite direction until your unaffected ear is facing the floor. 6. Hold this position for 30 seconds. You may experience an attack of vertigo. This is normal. Hold this position until the vertigo stops. 7. Turn your whole body to the same side as your head. Hold for another 30 seconds. 8. Sit back up. You can repeat this exercise up to 3 times a day. Follow these instructions at home:  After doing the Epley maneuver, you can return to  your normal activities.  Ask your health care provider if there is anything you should do at home to prevent vertigo. He or she may recommend that you: ? Keep your head raised (elevated) with two or more pillows while you sleep. ? Do not sleep on the side of your affected ear. ? Get up slowly from bed. ? Avoid sudden movements during the day. ? Avoid extreme head movement, like looking up or bending over. Contact a health care provider if:  Your vertigo gets worse.  You have other symptoms, including: ? Nausea. ? Vomiting. ? Headache. Get help right away if:  You have vision changes.  You have a severe or worsening headache or neck pain.  You cannot stop vomiting.  You have new numbness or weakness in any part of your body. Summary  Vertigo is the feeling that you or your surroundings are moving when they are not.  The Epley maneuver is an exercise that relieves symptoms of vertigo.  If the Epley maneuver is done correctly, it is considered safe. You can do it up to 3 times a day. This information is not intended to replace advice given to you by your health care provider. Make sure you discuss any questions you have with your health care provider. Document Released: 08/15/2013 Document Revised: 06/30/2016 Document Reviewed: 06/30/2016 Elsevier Interactive Patient Education  2019 [employer].  Send me a Clinical cytogeneticist message in 2 days if no  better.  Let us know if you need anything.

## 2018-10-05 ENCOUNTER — Telehealth: Payer: Self-pay | Admitting: Family Medicine

## 2018-10-05 DIAGNOSIS — H6983 Other specified disorders of Eustachian tube, bilateral: Secondary | ICD-10-CM

## 2018-10-05 NOTE — Telephone Encounter (Signed)
Bilateral eustachian tube dysfunction.  Thank you.

## 2018-10-05 NOTE — Telephone Encounter (Signed)
Referral done. Patient informed 

## 2018-10-05 NOTE — Telephone Encounter (Signed)
Copied from CRM #[id number]. Topic: Referral - Request for Referral >> Oct 05, 2018  1:57 PM Percival Spanish wrote:  Pt saw Dr Carmelia Roller and he suggest she see a ENT and she call to make an appt but they told her she need a referral   [facility] ENT Meridee Score  Let me know diagnosis for referral and will do

## 2018-10-07 ENCOUNTER — Other Ambulatory Visit: Payer: Self-pay | Admitting: Family Medicine

## 2018-10-07 DIAGNOSIS — R42 Dizziness and giddiness: Secondary | ICD-10-CM

## 2018-10-07 DIAGNOSIS — F410 Panic disorder [episodic paroxysmal anxiety] without agoraphobia: Secondary | ICD-10-CM

## 2018-10-07 MED ORDER — HYDROXYZINE HCL 25 MG PO TABS: ORAL_TABLET | ORAL | 1 refills | Status: DC

## 2018-10-07 MED ORDER — MECLIZINE HCL 25 MG PO TABS: 25.0000 mg | ORAL_TABLET | Freq: Three times a day (TID) | ORAL | 3 refills | Status: DC | PRN

## 2018-10-07 NOTE — Telephone Encounter (Signed)
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[employer], [place], [place]   1 month ago Vasculitis of skin   Holiday representative at [employer], [place], [place]   2 months ago Bilateral lower extremity edema   Holiday representative at [employer], [place], [place]   3 months ago Acute deep vein thrombosis (DVT) of proximal vein of right lower extremity (HCC)   Holiday representative at [facility] [employer], [place], [place]   7 months ago Essential hypertension   Holiday representative at [employer], [place], [place]            meclizine (ANTIVERT) 25 MG tablet 30 tablet 0    Sig: Take 1 tablet (25 mg total) by mouth 3 (three) times daily as needed for dizziness.     Not Delegated - Gastroenterology: Antiemetics Failed - 10/07/2018 12:51 PM      Failed - This refill cannot be delegated      Passed - Valid encounter within last 6 months    Recent Outpatient Visits          1 week ago Vertigo   Holiday representative at [facility] [place], [place], [place]   1 month ago Vasculitis of skin   Holiday representative at [employer], [place], [place]   2 months ago Bilateral lower extremity edema   Holiday representative at [facility] [employer],  [place], [place]   3 months ago Acute deep vein thrombosis (DVT) of proximal vein of right lower extremity (HCC)   Holiday representative at [facility] [employer], [place], [place]   7 months ago Essential hypertension   Holiday representative at [employer], [place], [place]

## 2018-10-07 NOTE — Telephone Encounter (Signed)
Copied from CRM [id number]. Topic: Quick Communication - Rx Refill/Question >> Oct 07, 2018 12:43 PM Tamela Oddi wrote: Medication: hydrOXYzine (ATARAX/VISTARIL) 25 MG tablet / meclizine (ANTIVERT) 25 MG tablet  Patient called to request a refill for the above medications  Preferred Pharmacy (with phone number or street name): [id number] [place],  - [address] [phone] (Phone) [phone] (Fax)

## 2018-10-13 DIAGNOSIS — R42 Dizziness and giddiness: Secondary | ICD-10-CM | POA: Insufficient documentation

## 2018-10-13 DIAGNOSIS — H938X2 Other specified disorders of left ear: Secondary | ICD-10-CM | POA: Diagnosis not present

## 2018-10-13 DIAGNOSIS — H9072 Mixed conductive and sensorineural hearing loss, unilateral, left ear, with unrestricted hearing on the contralateral side: Secondary | ICD-10-CM | POA: Diagnosis not present

## 2018-10-13 DIAGNOSIS — Z8669 Personal history of other diseases of the nervous system and sense organs: Secondary | ICD-10-CM | POA: Diagnosis not present

## 2018-10-27 ENCOUNTER — Other Ambulatory Visit: Payer: Self-pay | Admitting: Family Medicine

## 2018-10-27 MED ORDER — HYDROCHLOROTHIAZIDE 25 MG PO TABS: ORAL_TABLET | ORAL | 0 refills | Status: DC

## 2018-11-02 DIAGNOSIS — M5412 Radiculopathy, cervical region: Secondary | ICD-10-CM | POA: Diagnosis not present

## 2018-11-07 ENCOUNTER — Other Ambulatory Visit: Payer: Self-pay | Admitting: Family Medicine

## 2018-11-07 DIAGNOSIS — F410 Panic disorder [episodic paroxysmal anxiety] without agoraphobia: Secondary | ICD-10-CM

## 2018-11-18 ENCOUNTER — Other Ambulatory Visit: Payer: Self-pay | Admitting: Family Medicine

## 2018-11-22 ENCOUNTER — Other Ambulatory Visit: Payer: Self-pay | Admitting: Family Medicine

## 2018-12-05 ENCOUNTER — Other Ambulatory Visit: Payer: Self-pay | Admitting: Family Medicine

## 2018-12-08 ENCOUNTER — Ambulatory Visit (INDEPENDENT_AMBULATORY_CARE_PROVIDER_SITE_OTHER): Payer: BLUE CROSS/BLUE SHIELD | Admitting: Medical

## 2018-12-08 ENCOUNTER — Encounter: Payer: Self-pay | Admitting: Medical

## 2018-12-08 ENCOUNTER — Other Ambulatory Visit: Payer: Self-pay

## 2018-12-08 VITALS — Temp 97.6°F

## 2018-12-08 DIAGNOSIS — L97519 Non-pressure chronic ulcer of other part of right foot with unspecified severity: Secondary | ICD-10-CM

## 2018-12-08 DIAGNOSIS — T7840XA Allergy, unspecified, initial encounter: Secondary | ICD-10-CM

## 2018-12-08 MED ORDER — PREDNISONE 10 MG (21) PO TBPK: ORAL_TABLET | ORAL | 0 refills | Status: DC

## 2018-12-08 MED ORDER — DOXYCYCLINE HYCLATE 100 MG PO TABS: 100.0000 mg | ORAL_TABLET | Freq: Two times a day (BID) | ORAL | 0 refills | Status: DC

## 2018-12-08 NOTE — Progress Notes (Signed)
   Subjective:    Patient ID: [name], female    DOB: [birth year], 58 y.o.   MRN: [id number]  HPI  Virtual Visit via Video Note  I connected with [name] on 12/08/18 at  9:20 AM EDT by a video enabled telemedicine application and verified that I am speaking with the correct person using two identifiers.   I discussed the limitations of evaluation and management by telemedicine and the availability of in person appointments. The patient expressed understanding and agreed to proceed.   History of Present Illness:   Pt in states that she was working in her garden about one week ago. She thinks she came into contact with poison ivy. The area was itching a lot first 3-4 days. She was using benadryl and cortaid initially. She states rash was present on rt foot initially. Then about a week ago she got rash on left foot. Left foot is not itching presently. On left foot small redness beneath nails.  Rt foot has 2 scabs at below 2nd and 3rd toe nail which she described as ulcers with yellow dc before scabs formed. Pt is not diabetic.  Pt had no cough above her normal mild cough. She does smoke and cough not beyond normal. Recent mild allergic rhinitis. Recent some mild fatigue. No fever, No diarrhea. No diarrhea. Faint intermittent mild ha. Hx of migraines in the past.   Observations/Objective: No acute distress. Rt foot- redness, pinkish color to toes. But rt  second toe and 3rd have breakdown of epidermis. By inspection it looks like breakdown past epidermis. Covering large portion of toe(She described small ulcer before scab formed and had yellow dc.    Assessment and Plan: Probable allergic reaction to poison ivy with secondary skin infection.  Some concern based on appearance via video observation.  Also patient describes some yellow discharge to the right second and third toe regions which are the worst on inspection.  Did go ahead and prescribe 6-day taper dose of  prednisone and doxycycline.  In light of the appearance and depth of possible breakdown/ulcerations of second and third toe went ahead and place referral to podiatrist and ask referral coordinator to see if we can get her in but tomorrow morning.  We had conversation about COVID presentations presenting early with foot lesions.  This was a concern of patient but she really does not have constellation of signs and symptoms that indicate COVID.  History more consistent with above as stated.  Also as I understand COVID foot lesions typically occur more in pediatric patients.  Will follow patient closely and see if she develops any other signs and symptoms.  Follow-up date to be determined depending on if we can get her in with podiatrist.  Follow Up Instructions:    I discussed the assessment and treatment plan with the patient. The patient was provided an opportunity to ask questions and all were answered. The patient agreed with the plan and demonstrated an understanding of the instructions.   The patient was advised to call back or seek an in-person evaluation if the symptoms worsen or if the condition fails to improve as anticipated.  I provided 25  minutes of non-face-to-face time during this encounter.   Esperanza Richters, PA-C   Review of Systems See hpi.    Objective:   Physical Exam  See objective      Assessment & Plan:

## 2018-12-08 NOTE — Patient Instructions (Signed)
Probable allergic reaction to poison ivy with secondary skin infection.  Some concern based on appearance via video observation.  Also patient describes some yellow discharge to the right second and third toe regions which are the worst on inspection.  Did go ahead and prescribe 6-day taper dose of prednisone and doxycycline.  In light of the appearance and depth of possible breakdown/ulcerations of second and third toe went ahead and place referral to podiatrist and ask referral coordinator to see if we can get her in but tomorrow morning.  We had conversation about COVID presentations presenting early with foot lesions.  This was a concern of patient but she really does not have constellation of signs and symptoms that indicate COVID.  History more consistent with above as stated.  Also as I understand COVID foot lesions typically occur more in pediatric patients.  Will follow patient closely and see if she develops any other signs and symptoms.  Follow-up date to be determined depending on if we can get her in with podiatrist.

## 2018-12-12 ENCOUNTER — Other Ambulatory Visit: Payer: Self-pay | Admitting: Family Medicine

## 2018-12-12 DIAGNOSIS — F410 Panic disorder [episodic paroxysmal anxiety] without agoraphobia: Secondary | ICD-10-CM

## 2018-12-14 ENCOUNTER — Ambulatory Visit: Payer: BLUE CROSS/BLUE SHIELD | Admitting: Podiatry

## 2018-12-14 ENCOUNTER — Other Ambulatory Visit: Payer: Self-pay

## 2018-12-14 ENCOUNTER — Encounter: Payer: Self-pay | Admitting: Podiatry

## 2018-12-14 ENCOUNTER — Ambulatory Visit (INDEPENDENT_AMBULATORY_CARE_PROVIDER_SITE_OTHER): Payer: BLUE CROSS/BLUE SHIELD

## 2018-12-14 VITALS — BP 143/91 | HR 86

## 2018-12-14 DIAGNOSIS — L237 Allergic contact dermatitis due to plants, except food: Secondary | ICD-10-CM | POA: Diagnosis not present

## 2018-12-14 DIAGNOSIS — L309 Dermatitis, unspecified: Secondary | ICD-10-CM | POA: Diagnosis not present

## 2018-12-14 DIAGNOSIS — S99921A Unspecified injury of right foot, initial encounter: Secondary | ICD-10-CM

## 2018-12-14 MED ORDER — CLOBETASOL PROPIONATE 0.05 % EX OINT: 1.0000 "application " | TOPICAL_OINTMENT | Freq: Two times a day (BID) | CUTANEOUS | 1 refills | Status: DC

## 2018-12-14 NOTE — Progress Notes (Signed)
   HPI: 58 year old female presents the office today for evaluation of open wounds to the right foot.  Patient states that a few weeks ago she got into some poison ivy while gardening barefoot.  She went to her primary care doctor and she is currently on oral prednisone and doxycycline antibiotic.  She has noted some improvement to the area however last week she dropped a can of food on her right toes.  She is concerned for possible fracture.  She presents today for further treatment evaluation  Past Medical History:  Diagnosis Date  . Back injury    Chronic  . Bronchitis   . Cardiomyopathy (HCC)    hypertension controlled-helped with edema-Dr Nahser  . Chest pain    Normal heart catherization  . Collapsed lung   . Deafness    Left ear  . Degenerative joint disease    with neck surgery  . Depression   . Diastolic dysfunction    with possible mild LVOT gradient  . Dysrhythmia   . Family history of adverse reaction to anesthesia    " my daughter has PONV and gets very grumpy"  . Fibromyalgia   . Former cigarette smoker   . Gestational diabetes   . H/O blood clots    due to side effect of medication  . Headache(784.0)    migraines  . Hypertension   . Leg cramps   . Neuropathy   . Pain    chronic   . Pneumonia   . Pneumothorax   . PTSD (post-traumatic stress disorder)   . Respiratory disorder         Physical Exam: General: The patient is alert and oriented x3 in no acute distress.  Dermatology: Skin is warm, dry and supple bilateral lower extremities. Negative for open lesions or macerations.  Superficial partial-thickness ulcerations noted to digits 2 - 4 of the right foot.  Please see attached picture.  Minimal edema noted with some erythema.  Vascular: Palpable pedal pulses bilaterally.  Mild edema and erythema noted localized to the toes. Capillary refill within normal limits.  Neurological: Epicritic and protective threshold grossly intact bilaterally.    Musculoskeletal Exam: Range of motion within normal limits to all pedal and ankle joints bilateral. Muscle strength 5/5 in all groups bilateral.   Radiographic Exam:  Normal osseous mineralization. Joint spaces preserved. No fracture/dislocation/boney destruction.    Assessment: 1.  Right foot injury digits 2-4 right foot -negative for fracture 2.  Contact dermatitis secondary to poison ivy bilateral   Plan of Care:  1. Patient evaluated. X-Rays reviewed.  2.  Recommend good shoe gear 3.  Prescription for Temovate ointment 4.  Continue oral prednisone and doxycycline as prescribed by PCP 5.  Return to clinic as needed      Felecia Shelling, DPM [facility]  Dr. Felecia Shelling, DPM    [address] [place], [place] [address]                Office [phone]  Fax [phone]

## 2018-12-15 ENCOUNTER — Other Ambulatory Visit: Payer: Self-pay | Admitting: Family Medicine

## 2018-12-15 DIAGNOSIS — F411 Generalized anxiety disorder: Secondary | ICD-10-CM

## 2018-12-19 ENCOUNTER — Other Ambulatory Visit: Payer: Self-pay | Admitting: Family Medicine

## 2018-12-19 NOTE — Telephone Encounter (Signed)
Patient also requesting XANAX for anxiety and panic attacks

## 2018-12-19 NOTE — Telephone Encounter (Signed)
Requested medication (s) are due for refill today: Yes  Requested medication (s) are on the active medication list: Yes  Last refill:  06/22/18  Future visit scheduled: No  Notes to clinic: Unable to refill, last refilled by another provider, correct dosage to be ordered     Requested Prescriptions  Pending Prescriptions Disp Refills   Rivaroxaban 15 & 20 MG TBPK 51 each 0    Sig: Take as directed on package: Start with one 15mg  tablet by mouth twice a day with food. On Day 22, switch to one 20mg  tablet once a day with food.     Hematology: Anticoagulants - rivaroxaban Failed - 12/19/2018  4:02 PM      Failed - PLT in normal range and within 360 days    Platelets  Date Value Ref Range Status  06/21/2018 495 (H) 150 - 400 K/uL Final         Passed - ALT in normal range and within 180 days    ALT  Date Value Ref Range Status  08/04/2018 13 0 - 35 U/L Final         Passed - AST in normal range and within 180 days    AST  Date Value Ref Range Status  08/04/2018 18 0 - 37 U/L Final         Passed - Cr in normal range and within 360 days    Creat  Date Value Ref Range Status  12/19/2015 0.87 0.50 - 1.05 mg/dL Final   Creatinine, Ser  Date Value Ref Range Status  08/22/2018 0.90 0.40 - 1.20 mg/dL Final         Passed - HCT in normal range and within 360 days    HCT  Date Value Ref Range Status  06/21/2018 40.8 36.0 - 46.0 % Final         Passed - HGB in normal range and within 360 days    Hemoglobin  Date Value Ref Range Status  06/21/2018 12.3 12.0 - 15.0 g/dL Final         Passed - Valid encounter within last 12 months    Recent Outpatient Visits          1 week ago Allergic reaction, initial encounter   Holiday representative at [employer], [place], [place]   2 months ago Vertigo   Holiday representative at [facility] [employer], [place], [place]   3 months ago Vasculitis of skin   Holiday representative at [employer], [place], [place]   5 months ago Bilateral lower extremity edema   Holiday representative at [employer], [place], DO   5 months ago Acute deep vein thrombosis (DVT) of proximal vein of right lower extremity (HCC)   Holiday representative at [facility] [employer], [place], DO      Future Appointments            In 3 months Nahser, Deloris Ping, MD [facility] [employer], [facility]

## 2018-12-20 MED ORDER — RIVAROXABAN 20 MG PO TABS: 20.0000 mg | ORAL_TABLET | Freq: Every day | ORAL | 2 refills | Status: DC

## 2019-01-09 ENCOUNTER — Other Ambulatory Visit: Payer: Self-pay | Admitting: Family Medicine

## 2019-01-09 DIAGNOSIS — F410 Panic disorder [episodic paroxysmal anxiety] without agoraphobia: Secondary | ICD-10-CM

## 2019-01-18 ENCOUNTER — Other Ambulatory Visit: Payer: Self-pay | Admitting: Family Medicine

## 2019-01-22 ENCOUNTER — Other Ambulatory Visit: Payer: Self-pay | Admitting: Cardiovascular Disease

## 2019-02-01 ENCOUNTER — Other Ambulatory Visit: Payer: Self-pay | Admitting: Family Medicine

## 2019-02-01 DIAGNOSIS — M79641 Pain in right hand: Secondary | ICD-10-CM | POA: Diagnosis not present

## 2019-02-01 DIAGNOSIS — F410 Panic disorder [episodic paroxysmal anxiety] without agoraphobia: Secondary | ICD-10-CM

## 2019-02-14 ENCOUNTER — Other Ambulatory Visit: Payer: Self-pay | Admitting: Family Medicine

## 2019-02-16 DIAGNOSIS — G5603 Carpal tunnel syndrome, bilateral upper limbs: Secondary | ICD-10-CM | POA: Diagnosis not present

## 2019-02-16 DIAGNOSIS — G5623 Lesion of ulnar nerve, bilateral upper limbs: Secondary | ICD-10-CM | POA: Diagnosis not present

## 2019-03-01 DIAGNOSIS — G5603 Carpal tunnel syndrome, bilateral upper limbs: Secondary | ICD-10-CM | POA: Diagnosis not present

## 2019-03-01 DIAGNOSIS — I10 Essential (primary) hypertension: Secondary | ICD-10-CM | POA: Diagnosis not present

## 2019-03-07 ENCOUNTER — Other Ambulatory Visit: Payer: Self-pay | Admitting: Family Medicine

## 2019-03-07 DIAGNOSIS — F410 Panic disorder [episodic paroxysmal anxiety] without agoraphobia: Secondary | ICD-10-CM

## 2019-03-16 ENCOUNTER — Other Ambulatory Visit: Payer: Self-pay | Admitting: Family Medicine

## 2019-03-19 ENCOUNTER — Other Ambulatory Visit: Payer: Self-pay | Admitting: Family Medicine

## 2019-03-19 DIAGNOSIS — F411 Generalized anxiety disorder: Secondary | ICD-10-CM

## 2019-04-04 ENCOUNTER — Other Ambulatory Visit: Payer: Self-pay | Admitting: Family Medicine

## 2019-04-04 DIAGNOSIS — F410 Panic disorder [episodic paroxysmal anxiety] without agoraphobia: Secondary | ICD-10-CM

## 2019-04-04 NOTE — Telephone Encounter (Signed)
Pharmacy requesting a 90 day refill?? Amount?

## 2019-04-12 ENCOUNTER — Ambulatory Visit: Payer: BLUE CROSS/BLUE SHIELD | Admitting: Cardiovascular Disease

## 2019-04-13 DIAGNOSIS — F17210 Nicotine dependence, cigarettes, uncomplicated: Secondary | ICD-10-CM | POA: Diagnosis not present

## 2019-04-13 DIAGNOSIS — M26623 Arthralgia of bilateral temporomandibular joint: Secondary | ICD-10-CM | POA: Diagnosis not present

## 2019-04-13 DIAGNOSIS — H9202 Otalgia, left ear: Secondary | ICD-10-CM | POA: Diagnosis not present

## 2019-04-13 DIAGNOSIS — H95192 Other disorders following mastoidectomy, left ear: Secondary | ICD-10-CM | POA: Diagnosis not present

## 2019-04-15 ENCOUNTER — Other Ambulatory Visit: Payer: Self-pay | Admitting: Family Medicine

## 2019-04-15 DIAGNOSIS — F411 Generalized anxiety disorder: Secondary | ICD-10-CM

## 2019-04-21 ENCOUNTER — Other Ambulatory Visit: Payer: Self-pay | Admitting: Family Medicine

## 2019-04-21 DIAGNOSIS — F410 Panic disorder [episodic paroxysmal anxiety] without agoraphobia: Secondary | ICD-10-CM

## 2019-04-28 ENCOUNTER — Other Ambulatory Visit: Payer: Self-pay | Admitting: Family Medicine

## 2019-04-28 DIAGNOSIS — F410 Panic disorder [episodic paroxysmal anxiety] without agoraphobia: Secondary | ICD-10-CM

## 2019-05-02 ENCOUNTER — Other Ambulatory Visit: Payer: Self-pay | Admitting: Family Medicine

## 2019-05-02 DIAGNOSIS — F410 Panic disorder [episodic paroxysmal anxiety] without agoraphobia: Secondary | ICD-10-CM

## 2019-05-05 DIAGNOSIS — G5603 Carpal tunnel syndrome, bilateral upper limbs: Secondary | ICD-10-CM | POA: Diagnosis not present

## 2019-05-05 DIAGNOSIS — G5623 Lesion of ulnar nerve, bilateral upper limbs: Secondary | ICD-10-CM | POA: Diagnosis not present

## 2019-05-10 ENCOUNTER — Other Ambulatory Visit: Payer: Self-pay | Admitting: Podiatry

## 2019-05-14 ENCOUNTER — Other Ambulatory Visit: Payer: Self-pay | Admitting: Family Medicine

## 2019-05-16 ENCOUNTER — Encounter: Payer: Self-pay | Admitting: Family Medicine

## 2019-05-16 ENCOUNTER — Telehealth: Payer: Self-pay | Admitting: Family Medicine

## 2019-05-16 ENCOUNTER — Ambulatory Visit: Payer: BLUE CROSS/BLUE SHIELD | Admitting: Family Medicine

## 2019-05-16 ENCOUNTER — Other Ambulatory Visit: Payer: Self-pay

## 2019-05-16 ENCOUNTER — Other Ambulatory Visit (HOSPITAL_COMMUNITY): Payer: BLUE CROSS/BLUE SHIELD

## 2019-05-16 VITALS — BP 118/76 | HR 66 | Temp 95.7°F | Ht 65.0 in | Wt 139.5 lb

## 2019-05-16 DIAGNOSIS — R9431 Abnormal electrocardiogram [ECG] [EKG]: Secondary | ICD-10-CM

## 2019-05-16 DIAGNOSIS — F411 Generalized anxiety disorder: Secondary | ICD-10-CM

## 2019-05-16 DIAGNOSIS — Z01818 Encounter for other preprocedural examination: Secondary | ICD-10-CM

## 2019-05-16 LAB — BASIC METABOLIC PANEL
BUN: 10 mg/dL (ref 6–23)
CO2: 26 mEq/L (ref 19–32)
Calcium: 9.4 mg/dL (ref 8.4–10.5)
Chloride: 98 mEq/L (ref 96–112)
Creatinine, Ser: 0.75 mg/dL (ref 0.40–1.20)
GFR: 79.76 mL/min (ref >60.00)
Glucose, Bld: 99 mg/dL (ref 70–99)
Potassium: 4.2 mEq/L (ref 3.5–5.1)
Sodium: 131 mEq/L — ABNORMAL LOW (ref 135–145)

## 2019-05-16 LAB — CBC
HCT: 32.1 % — ABNORMAL LOW (ref 36.0–46.0)
Hemoglobin: 10.1 g/dL — ABNORMAL LOW (ref 12.0–15.0)
MCHC: 31.5 g/dL (ref 30.0–36.0)
MCV: 70.9 fl — ABNORMAL LOW (ref 78.0–100.0)
Platelets: 526 10*3/uL — ABNORMAL HIGH (ref 150.0–400.0)
RBC: 4.54 Mil/uL (ref 3.87–5.11)
RDW: 20.3 % — ABNORMAL HIGH (ref 11.5–15.5)
WBC: 9.6 10*3/uL (ref 4.0–10.5)

## 2019-05-16 MED ORDER — DIAZEPAM 10 MG PO TABS: 5.0000 mg | ORAL_TABLET | Freq: Three times a day (TID) | ORAL | 1 refills | Status: DC | PRN

## 2019-05-16 NOTE — Telephone Encounter (Signed)
Copied from [place] [id number]. Topic: General - Other >> May 16, 2019 12:40 PM Yvette Rack wrote: Reason for CRM: Pt requests that Shirlean Mylar call her back asap.  The patient called to let us know her Echo has been scheduled for tomorrow 05/17/2019

## 2019-05-16 NOTE — Patient Instructions (Addendum)
Give [place] 2-3 business days to get the results of your labs back.   Stay off of Xarelto until after surgery.    Let us know if you need anything.  [facility] [address] [place], [place] [address] [phone]  [facility] [address] [place], [place] [address] [phone]  [facility] [place],  [address] [phone]  [facility] [address], [place], [place], #[id number] [address], [place], [place], [place]  [facility] [place], [place], [place], [place]  [facility] [place], [place]  Call one of these offices sooner than later as it can take 2-3 months to get a new patient appointment.

## 2019-05-16 NOTE — Progress Notes (Signed)
Subjective:   Chief Complaint  Patient presents with  . Pre-op Exam    [name]  is here for a Pre-operative physical at the request of Dr. Koleen Distance.   She  is having R carpal tunnel and R ulnar nerve release surgery on 05/18/2019 for CTS.  Personal or family hx of adverse outcome to anesthesia? No with her, daughter cannot handle Chipped, cracked, missing, or loose teeth? Yes, loose upper L molar, Upper R molar w issues, lower incisor molar Decreased ROM of neck? Yes (chronic) Able to walk up 2 flights of stairs without becoming significantly short of breath or having chest pain? Yes   Revised Goldman Criteria: High Risk Surgery (intraperitoneal, intrathoracic, aortic): No  Ischemic heart disease (Prior MI, +excercise stress test, angina, nitrate use, Qwave): No  History of heart failure: No  History of cerebrovascular disease: No  History of diabetes: No  Insulin therapy for DM: No  Preoperative Cr >2.0: No   Pt also brought up uncontrolled anxiety over past several mo since pandemic started. She is on BuSpar 15 mg TID, Celexa 20 mg/d, and hydroxyzine 50-100 mg tid prn. This combination was working well, but no longer is. Other than pandemic restrictions, no other changes or stressors. She does not follow w psych. She has been reluctant to change her Celexa in the past. Has done well with Valium in the past for prn usage. Is on Tramadol instead of hydrocodone for pain now.   Patient Active Problem List   Diagnosis Date Noted  . Dizziness 10/13/2018  . History of mastoiditis 10/13/2018  . Vasculitis of skin 08/22/2018  . Cervical radiculopathy 06/13/2018  . GAD (generalized anxiety disorder) 03/09/2018  . Mixed conductive and sensorineural hearing loss of left ear 09/16/2017  . Chronic mastoiditis of left side 05/27/2017  . Fibromyalgia 06/19/2016  . Spondylosis of cervical joint 02/21/2016  . Depression 04/09/2015  . Gastritis 04/09/2015  . Uncomplicated alcohol dependence  ([place]) 04/08/2015  . HTN (hypertension) 08/27/2014  . Allodynia 05/17/2013  . Chest pain syndrome 04/28/2013  . Mild HOCM (hypertrophic obstructive cardiomyopathy) ([place]) 04/28/2013  . Palpitations 04/28/2013   Past Medical History:  Diagnosis Date  . Back injury    Chronic  . Bronchitis   . Cardiomyopathy ([place])    hypertension controlled-helped with edema-Dr Nahser  . Chest pain    Normal heart catherization  . Collapsed lung   . Deafness    Left ear  . Degenerative joint disease    with neck surgery  . Depression   . Diastolic dysfunction    with possible mild LVOT gradient  . Dysrhythmia   . Family history of adverse reaction to anesthesia    " my daughter has PONV and gets very grumpy"  . Fibromyalgia   . Former cigarette smoker   . Gestational diabetes   . H/O blood clots    due to side effect of medication  . Headache(784.0)    migraines  . Hypertension   . Leg cramps   . Neuropathy   . Pain    chronic   . Pneumonia   . Pneumothorax   . PTSD (post-traumatic stress disorder)   . Respiratory disorder     Past Surgical History:  Procedure Laterality Date  . ABDOMINAL HYSTERECTOMY     partial  . CARDIAC CATHETERIZATION     Ejection Fraction 65-70%  . CERVICAL DISC ARTHROPLASTY N/A 02/21/2016   Procedure: Cervical three-four artificial disc replacement;  Surgeon: Kristeen Miss, MD;  Location:  [facility];  Service: Neurosurgery;  Laterality: N/A;  . CERVICAL FUSION     C5-6  . EXTERNAL EAR SURGERY     tumor at age 21, 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  . HERNIA REPAIR     " as a baby"  . NECK SURGERY    . OTHER SURGICAL HISTORY     History of cervical and lumbar disk surgery  . polypectomies     tubes  . POSTERIOR CERVICAL LAMINECTOMY WITH MET- RX Left 06/13/2018   Procedure: Left Cervical three-four Laminotomy/foraminotomy;  Surgeon: Kristeen Miss, MD;  Location: [facility];  Service: Neurosurgery;  Laterality: Left;  . TONSILLECTOMY AND ADENOIDECTOMY      Current  Outpatient Medications  Medication Sig Dispense Refill  . acetaminophen (TYLENOL) 500 MG tablet Take 1,000 mg by mouth every 6 (six) hours as needed for mild pain or headache.     . bisacodyl (DULCOLAX) 5 MG EC tablet Take 5 mg by mouth 2 (two) times daily as needed for moderate constipation.     . busPIRone (BUSPAR) 15 MG tablet TAKE 1 TABLET BY MOUTH THREE TIMES A DAY 270 tablet 1  . citalopram (CELEXA) 20 MG tablet TAKE 1 TABLET BY MOUTH EVERY DAY 90 tablet 0  . clobetasol ointment (TEMOVATE) 0.05 % APPLY TO AFFECTED AREA TWICE A DAY 30 g 1  . hydrochlorothiazide (HYDRODIURIL) 25 MG tablet TAKE 1 TABLET(25 MG) BY MOUTH DAILY 90 tablet 0  . hydrOXYzine (ATARAX/VISTARIL) 25 MG tablet TAKE 3 TO 4 TABLETS BY MOUTH EVERY 8 HOURS AS NEEDED FOR ANXIETY. MAX AMOUNT PER INSURANCE 360 tablet 2  . levothyroxine (SYNTHROID) 50 MCG tablet TAKE 1 TABLET BY MOUTH EVERY DAY 90 tablet 0  . loratadine (CLARITIN) 10 MG tablet Take 10 mg by mouth 2 (two) times daily as needed (for seasonal allergies).     . Magnesium 500 MG TABS Take 500 mg by mouth daily.    . meclizine (ANTIVERT) 25 MG tablet Take 1 tablet (25 mg total) by mouth 3 (three) times daily as needed for dizziness. 30 tablet 3  . metoprolol tartrate (LOPRESSOR) 25 MG tablet TAKE 1 TABLET BY MOUTH TWICE A DAY 180 tablet 1  . naproxen (NAPROSYN) 500 MG tablet TAKE 1 TAB DAILY AS NEEDED FOR PAIN. USE SPARINGLY. 30 tablet 1  . predniSONE (STERAPRED UNI-PAK 21 TAB) 10 MG (21) TBPK tablet Taper over 6 days 21 tablet 0  . tiZANidine (ZANAFLEX) 4 MG tablet Take 4 mg by mouth 3 (three) times daily.    . vitamin B-12 (CYANOCOBALAMIN) 500 MCG tablet Take 500 mcg by mouth daily.    Alveda Reasons 20 MG TABS tablet TAKE 1 TABLET (20 MG TOTAL) BY MOUTH DAILY WITH SUPPER. 90 tablet 0   Allergies  Allergen Reactions  . Morphine And Related Other (See Comments)    "Didn't agree with me/wasn't very effective.."  . Lyrica [Pregabalin] Rash  . Neurontin [Gabapentin]  Other (See Comments)    Left patient sleepy all the time    Family History  Problem Relation Age of Onset  . Coronary artery disease Father   . Parkinson's disease Mother   . Cancer Sister        breast  . Diabetes Sister   . Diabetes Brother      Review of Systems:  Constitutional:  no fevers Eye:  no recent significant change in vision Ear:  No ear pain Nose/Mouth/Throat:  No new dental complaints Neck/Thyroid:  no lumps or masses Pulmonary:  No shortness of breath Cardiovascular:  no chest pain Gastrointestinal:  no abdominal pain GU:  negative for dysuria Musculoskeletal/Extremities:  +neck pain Skin/Integumentary ROS:  no abnormal skin lesions reported Neurologic:  +Hand weakness Psych: +anxiety  Objective:   Vitals:   05/16/19 0725  BP: 118/76  Pulse: 66  Temp: (!) 95.7 F (35.4 C)  TempSrc: Temporal  SpO2: 96%  Weight: 139 lb 8 oz (63.3 kg)  Height: '5\' 5"'$  (1.651 m)   Body mass index is 23.21 kg/m.  General:  well developed, well nourished, in no apparent distress Skin:  warm, no pallor or diaphoresis Head:  normocephalic, atraumatic Eyes:  pupils equal and round, sclera anicteric without injection Ears:  canals without lesions, TMs shiny without retraction, no obvious effusion, no erythema;+d/c n b/l ears, worse on L (following with ENT) Throat/Pharynx:  lips and gingiva without lesion; tongue and uvula midline; non-inflamed pharynx; no exudates or postnasal drainage  Neck: neck supple without adenopathy, thyromegaly, or masses, no bruits, no jugular venous distention Lungs:  clear to auscultation, breath sounds equal bilaterally, no respiratory distress Cardio:  regular rate and rhythm without murmurs Abdomen:  abdomen soft, nontender; bowel sounds normal; no masses, hepatomegaly or splenomegaly Musculoskeletal:  symmetrical muscle groups noted without atrophy or deformity Extremities:  no clubbing, cyanosis, or edema, no deformities, no skin  discoloration Neuro:  gait normal; deep tendon reflexes normal and symmetric and alert and oriented to person, place, and time Psych: Age appropriate judgment and insight; normal mood   Assessment:   Preop examination - Plan: Basic metabolic panel, CBC, EKG 44-LPNP  GAD (generalized anxiety disorder) - Plan: diazepam (VALIUM) 10 MG tablet  T wave inversion in EKG - Plan: ECHOCARDIOGRAM COMPLETE   Plan:   Pt was told to hold Xarelto 2 days ago. Procedure in 2 d. Will have her cont to hold.  EKG - NSR, there are some new T wave inversions compared to previous tracing; I did discussed this change with a cardiologist.  Will obtain echo and allow for surgery as she is low risk otherwise and totally asymptomatic.  The above laboratory work was ordered and will be sent with this physical. Pending the above workup, the patient is deemed low cardiac risk for the proposed procedure.  The patient voiced understanding and agreement to the plan.  [place], DO 05/16/19  12:38 PM

## 2019-05-17 ENCOUNTER — Ambulatory Visit (HOSPITAL_COMMUNITY): Payer: BLUE CROSS/BLUE SHIELD | Attending: Cardiology

## 2019-05-17 ENCOUNTER — Other Ambulatory Visit (HOSPITAL_COMMUNITY): Payer: BLUE CROSS/BLUE SHIELD

## 2019-05-17 DIAGNOSIS — R9431 Abnormal electrocardiogram [ECG] [EKG]: Secondary | ICD-10-CM | POA: Insufficient documentation

## 2019-05-18 DIAGNOSIS — G5601 Carpal tunnel syndrome, right upper limb: Secondary | ICD-10-CM | POA: Diagnosis not present

## 2019-05-18 DIAGNOSIS — G5621 Lesion of ulnar nerve, right upper limb: Secondary | ICD-10-CM | POA: Diagnosis not present

## 2019-05-24 DIAGNOSIS — H906 Mixed conductive and sensorineural hearing loss, bilateral: Secondary | ICD-10-CM | POA: Diagnosis not present

## 2019-05-24 DIAGNOSIS — Z9089 Acquired absence of other organs: Secondary | ICD-10-CM | POA: Diagnosis not present

## 2019-05-24 DIAGNOSIS — H60331 Swimmer's ear, right ear: Secondary | ICD-10-CM | POA: Diagnosis not present

## 2019-05-24 DIAGNOSIS — H6501 Acute serous otitis media, right ear: Secondary | ICD-10-CM | POA: Diagnosis not present

## 2019-05-28 ENCOUNTER — Encounter: Payer: Self-pay | Admitting: Cardiovascular Disease

## 2019-05-28 NOTE — Progress Notes (Signed)
[name] Date of Birth  10/09/1960       [place]. [address], [place]   [place], [place]  [address]     [phone]        Fax  [phone]      Problem List: 1. HTN 2. Palpitations 3. Neck surgery 4. Peripheral neuropathy 5. Chest pain 6. Dynamic LVOT obstruction 7.  DVT - left thigh ( Dec. 2019)    CONCLUSIONS: 1. Smooth and normal coronary arteries. 2. Hyperdynamic left ventricular systolic function.  She most likely     has a dynamic left ventricular outflow tract gradient.  She will     probably benefit from beta-blockers and calcium channel blockers.        [name] is a 59 yo with hx of HTN and palpitations.  We had her on metoprolol.  She ran out of her metoprolol 6 months ago.  She has had some palpitations.   She stopped smoking but re-started again.    She is feeling well now.  Walking on occasion. She sees Virgie Dad for her neuropathy.    She has occasional chest discomfort - last only a second or so.  Typically occurs after she twists her torso.    [name], [name] is seen after a 1 year absence Recently had bronchitis,  Still coughing. Still smoking a few cigarettes.  Trying to exercise regularly - typically swims regularly,  Limited by peripheral neuropathy.  September 07, 2017:   Seen back for office visit Has had congestion Still smoking .   Exercising some .    BP is slightly elevated    She watches her salt.  Has gained some weight   Oct. 5, 2020  [name] is seen today for follow up. BP has been fluctuating  Seen back after  1 1/2 year absence   Has had some neck surgery  Had DVT in left thigh ,  Is now on xarelto .   Has cut back on her smoking .    Current Outpatient Medications on File Prior to Visit  Medication Sig Dispense Refill  . acetaminophen (TYLENOL) 500 MG tablet Take 1,000 mg by mouth every 6 (six) hours as needed for mild pain or headache.     . bisacodyl (DULCOLAX) 5 MG EC  tablet Take 5 mg by mouth 2 (two) times daily as needed for moderate constipation.     . busPIRone (BUSPAR) 15 MG tablet TAKE 1 TABLET BY MOUTH THREE TIMES A DAY 270 tablet 1  . citalopram (CELEXA) 20 MG tablet TAKE 1 TABLET BY MOUTH EVERY DAY 90 tablet 0  . clobetasol ointment (TEMOVATE) 0.05 % APPLY TO AFFECTED AREA TWICE A DAY 30 g 1  . diazepam (VALIUM) 10 MG tablet Take 0.5-1 tablets (5-10 mg total) by mouth every 8 (eight) hours as needed for anxiety. 30 tablet 1  . hydrochlorothiazide (HYDRODIURIL) 25 MG tablet TAKE 1 TABLET(25 MG) BY MOUTH DAILY 90 tablet 0  . HYDROcodone-acetaminophen (NORCO/VICODIN) 5-325 MG tablet Take 1 tablet by mouth as needed for pain.    . hydrOXYzine (ATARAX/VISTARIL) 25 MG tablet TAKE 3 TO 4 TABLETS BY MOUTH EVERY 8 HOURS AS NEEDED FOR ANXIETY. MAX AMOUNT PER INSURANCE 360 tablet 2  . levothyroxine (SYNTHROID) 50 MCG tablet TAKE 1 TABLET BY MOUTH EVERY DAY 90 tablet 0  . loratadine (CLARITIN) 10 MG tablet Take 10 mg by mouth 2 (two) times daily as  needed (for seasonal allergies).     . Magnesium 500 MG TABS Take 500 mg by mouth daily.    . meclizine (ANTIVERT) 25 MG tablet Take 1 tablet (25 mg total) by mouth 3 (three) times daily as needed for dizziness. 30 tablet 3  . methocarbamol (ROBAXIN) 500 MG tablet Take 1 tablet by mouth as needed for muscle spasms.    . metoprolol tartrate (LOPRESSOR) 25 MG tablet TAKE 1 TABLET BY MOUTH TWICE A DAY 180 tablet 1  . naproxen (NAPROSYN) 500 MG tablet TAKE 1 TAB DAILY AS NEEDED FOR PAIN. USE SPARINGLY. 30 tablet 1  . tiZANidine (ZANAFLEX) 4 MG tablet Take 4 mg by mouth 3 (three) times daily.    . traMADol (ULTRAM) 50 MG tablet Take 50 mg by mouth every 6 (six) hours as needed for pain.    . vitamin B-12 (CYANOCOBALAMIN) 500 MCG tablet Take 500 mcg by mouth daily.    Alveda Reasons 20 MG TABS tablet TAKE 1 TABLET (20 MG TOTAL) BY MOUTH DAILY WITH SUPPER. 90 tablet 0   No current facility-administered medications on file prior to  visit.     Allergies  Allergen Reactions  . Morphine And Related Other (See Comments)    "Didn't agree with me/wasn't very effective.."  . Lyrica [Pregabalin] Rash  . Neurontin [Gabapentin] Other (See Comments)    Left patient sleepy all the time    Past Medical History:  Diagnosis Date  . Back injury    Chronic  . Bronchitis   . Cardiomyopathy ([place])    hypertension controlled-helped with edema-Dr Nahser  . Chest pain    Normal heart catherization  . Collapsed lung   . Deafness    Left ear  . Degenerative joint disease    with neck surgery  . Depression   . Diastolic dysfunction    with possible mild LVOT gradient  . Dysrhythmia   . Family history of adverse reaction to anesthesia    " my daughter has PONV and gets very grumpy"  . Fibromyalgia   . Former cigarette smoker   . Gestational diabetes   . H/O blood clots    due to side effect of medication  . Headache(784.0)    migraines  . Hypertension   . Leg cramps   . Neuropathy   . Pain    chronic   . Pneumonia   . Pneumothorax   . PTSD (post-traumatic stress disorder)   . Respiratory disorder     Past Surgical History:  Procedure Laterality Date  . ABDOMINAL HYSTERECTOMY     partial  . CARDIAC CATHETERIZATION     Ejection Fraction 65-70%  . CERVICAL DISC ARTHROPLASTY N/A 02/21/2016   Procedure: Cervical three-four artificial disc replacement;  Surgeon: Kristeen Miss, MD;  Location: [facility];  Service: Neurosurgery;  Laterality: N/A;  . CERVICAL FUSION     C5-6  . EXTERNAL EAR SURGERY     tumor at age 14, 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  . HERNIA REPAIR     " as a baby"  . NECK SURGERY    . OTHER SURGICAL HISTORY     History of cervical and lumbar disk surgery  . polypectomies     tubes  . POSTERIOR CERVICAL LAMINECTOMY WITH MET- RX Left 06/13/2018   Procedure: Left Cervical three-four Laminotomy/foraminotomy;  Surgeon: Kristeen Miss, MD;  Location: [facility];  Service: Neurosurgery;  Laterality: Left;  .  TONSILLECTOMY AND ADENOIDECTOMY      Social History  Tobacco Use  Smoking Status Current Some Day Smoker  . Packs/day: 1.00  . Years: 25.00  . Pack years: 25.00  . Types: Cigarettes  Smokeless Tobacco Never Used    Social History   Substance and Sexual Activity  Alcohol Use Yes   Comment: very rare    Family History  Problem Relation Age of Onset  . Coronary artery disease Father   . Parkinson's disease Mother   . Cancer Sister        breast  . Diabetes Sister   . Diabetes Brother     Reviw of Systems:  Reviewed in the HPI.  All other systems are negative.   Physical Exam: Blood pressure 122/74, pulse (!) 107, height '5\' 5"'$  (1.651 m), weight 143 lb (64.9 kg), SpO2 (!) 81 %.  GEN:  Well nourished, well developed in no acute distress HEENT: Normal NECK: No JVD; No carotid bruits LYMPHATICS: No lymphadenopathy CARDIAC: RRR no murmurs RESPIRATORY:  Clear to auscultation without rales, wheezing or rhonchi  ABDOMEN: Soft, non-tender, non-distended MUSCULOSKELETAL:  No edema; No deformity  SKIN: Warm and dry NEUROLOGIC:  Alert and oriented x 3   ECG   Assessment / Plan:   1. HTN -   BP is well controlled. ,  Cont meds.  Refill HCTZ  K was normal  2. Palpitations -  No further palpitations   3. Neck surgery  4. Peripheral neuropathy -   5. Chest pain -    6. DVT:   Has a left thigh DVT.  Is now on xarelto     Mertie Moores, MD  05/29/2019 9:44 AM    [facility] [place],  [place], [place]  [address] Pager [phone] Phone: [phone]; Fax: [phone]

## 2019-05-29 ENCOUNTER — Encounter: Payer: Self-pay | Admitting: Cardiovascular Disease

## 2019-05-29 ENCOUNTER — Ambulatory Visit: Payer: BLUE CROSS/BLUE SHIELD | Admitting: Cardiovascular Disease

## 2019-05-29 ENCOUNTER — Other Ambulatory Visit: Payer: Self-pay

## 2019-05-29 VITALS — BP 122/74 | HR 107 | Ht 65.0 in | Wt 143.0 lb

## 2019-05-29 DIAGNOSIS — R002 Palpitations: Secondary | ICD-10-CM

## 2019-05-29 DIAGNOSIS — I10 Essential (primary) hypertension: Secondary | ICD-10-CM

## 2019-05-29 MED ORDER — HYDROCHLOROTHIAZIDE 25 MG PO TABS: ORAL_TABLET | ORAL | 3 refills | Status: DC

## 2019-05-29 MED ORDER — METOPROLOL TARTRATE 25 MG PO TABS: 25.0000 mg | ORAL_TABLET | Freq: Two times a day (BID) | ORAL | 3 refills | Status: DC

## 2019-05-29 NOTE — Patient Instructions (Addendum)
Medication Instructions:  Your physician recommends that you continue on your current medications as directed. Please refer to the Current Medication list given to you today.  Refills of your cardiac medications have been sent to your pharmacy   Lab work: None Ordered    Testing/Procedures: None Ordered    Follow-Up: At [facility], you and your health needs are our priority.  As part of our continuing mission to provide you with exceptional heart care, we have created designated Provider Care Teams.  These Care Teams include your primary Cardiologist (physician) and Advanced Practice Providers (APPs -  Physician Assistants and Nurse Practitioners) who all work together to provide you with the care you need, when you need it. You will need a follow up appointment in:  1 years.  Please call our office 2 months in advance to schedule this appointment.  You may see Mertie Moores, MD or one of the following Advanced Practice Providers on your designated Care Team: Richardson Dopp, PA-C [place], [place] . Daune Perch, NP

## 2019-06-01 ENCOUNTER — Other Ambulatory Visit: Payer: Self-pay | Admitting: Family Medicine

## 2019-06-01 DIAGNOSIS — F411 Generalized anxiety disorder: Secondary | ICD-10-CM

## 2019-06-02 ENCOUNTER — Telehealth: Payer: Self-pay | Admitting: Family Medicine

## 2019-06-02 ENCOUNTER — Ambulatory Visit (INDEPENDENT_AMBULATORY_CARE_PROVIDER_SITE_OTHER): Payer: BLUE CROSS/BLUE SHIELD | Admitting: Family Medicine

## 2019-06-02 ENCOUNTER — Encounter: Payer: Self-pay | Admitting: Family Medicine

## 2019-06-02 ENCOUNTER — Other Ambulatory Visit: Payer: Self-pay

## 2019-06-02 DIAGNOSIS — F411 Generalized anxiety disorder: Secondary | ICD-10-CM | POA: Diagnosis not present

## 2019-06-02 MED ORDER — DIAZEPAM 10 MG PO TABS: 10.0000 mg | ORAL_TABLET | Freq: Two times a day (BID) | ORAL | 0 refills | Status: DC | PRN

## 2019-06-02 NOTE — Progress Notes (Signed)
CC: Med ck  Subjective: Patient is a 58 y.o. female here for f/u anxiety. Due to COVID-19 pandemic, we are interacting via telephone. I verified patient's ID using 2 identifiers. Patient agreed to proceed with visit via this method. Patient is at home, I am at office. Patient and I are present for visit.   Has been taking 4 valium daily. 10 mg tabs. Reports things have been quite stressful. She is on Celexa 20 mg/d and BuSpar 15 mg TID, tolerating well and reports compliance.  She states that she misread the label on her Valium and thought she was supposed to take it more often than she has been.  She has an appointment with psychiatry coming up within the next month.  Of note, she recently took a muscle relaxant.    ROS:  Psych: No homicidal or suicidal ideation  Past Medical History:  Diagnosis Date  . Back injury    Chronic  . Bronchitis   . Cardiomyopathy ([place])    hypertension controlled-helped with edema-Dr Nahser  . Chest pain    Normal heart catherization  . Collapsed lung   . Deafness    Left ear  . Degenerative joint disease    with neck surgery  . Depression   . Diastolic dysfunction    with possible mild LVOT gradient  . Dysrhythmia   . Family history of adverse reaction to anesthesia    " my daughter has PONV and gets very grumpy"  . Fibromyalgia   . Former cigarette smoker   . Gestational diabetes   . H/O blood clots    due to side effect of medication  . Headache(784.0)    migraines  . Hypertension   . Leg cramps   . Neuropathy   . Pain    chronic   . Pneumonia   . Pneumothorax   . PTSD (post-traumatic stress disorder)   . Respiratory disorder     Objective: No conversational dyspnea It sounded as if the patient is intoxicated.  Nml affect and mood  Assessment and Plan: GAD (generalized anxiety disorder) - Plan: diazepam (VALIUM) 10 MG tablet   Refill diazepam as twice daily as needed rather than 3 times daily as needed.  Try to take muscle  relaxer in conjunction with this as I think she is getting sedated.  I do appreciate that she is setting up with psychiatry.  Continue BuSpar and Celexa in the meanwhile. I would like to see her in 2 weeks to see how things are going.  May need to change to Klonopin. Total time spent: 12 minutes 30 seconds The patient voiced understanding and agreement to the plan.  [place], DO 06/02/19  5:07 PM

## 2019-06-02 NOTE — Telephone Encounter (Signed)
Patient states her GAD is very severe at this moment.  Patient is requesting again for medication diazepam (VALIUM) 10 MG tablet  Patient states she only got 10 pills.  Let patient know it does look like it was 30 tablets with 10mg  and also we could not refill until 10/22 Patient would like to speak with PCP or nurse regarding this.    Patient call back [phone]

## 2019-06-12 ENCOUNTER — Ambulatory Visit: Payer: BLUE CROSS/BLUE SHIELD | Attending: Neurological Surgery

## 2019-06-12 ENCOUNTER — Other Ambulatory Visit: Payer: Self-pay

## 2019-06-12 DIAGNOSIS — M5412 Radiculopathy, cervical region: Secondary | ICD-10-CM

## 2019-06-12 DIAGNOSIS — M25531 Pain in right wrist: Secondary | ICD-10-CM

## 2019-06-12 DIAGNOSIS — R293 Abnormal posture: Secondary | ICD-10-CM | POA: Insufficient documentation

## 2019-06-12 DIAGNOSIS — M542 Cervicalgia: Secondary | ICD-10-CM

## 2019-06-12 DIAGNOSIS — M25521 Pain in right elbow: Secondary | ICD-10-CM | POA: Insufficient documentation

## 2019-06-12 NOTE — Patient Instructions (Signed)
Access Code: [email or web address]  URL: [email or web address]  Date: 06/12/2019  Prepared by: Tomma Rakers   Exercises Seated Elbow Flexion and Extension AROM - 10 reps - 1 sets - 1x daily - 7x weekly Wrist AROM Radial Ulnar Deviation - 10 reps - 1 sets - 1x daily - 7x weekly Wrist AROM Flexion Extension - 10 reps - 1 sets - 1x daily - 7x weekly Hand AROM Composite Flexion - 10 reps - 1 sets - 1x daily - 7x weekly
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
## 2019-06-13 ENCOUNTER — Ambulatory Visit (INDEPENDENT_AMBULATORY_CARE_PROVIDER_SITE_OTHER): Payer: BLUE CROSS/BLUE SHIELD | Admitting: Adult Health

## 2019-06-13 ENCOUNTER — Encounter: Payer: Self-pay | Admitting: Adult Health

## 2019-06-13 ENCOUNTER — Other Ambulatory Visit: Payer: Self-pay

## 2019-06-13 VITALS — BP 145/85 | HR 99 | Ht 65.0 in | Wt 156.0 lb

## 2019-06-13 DIAGNOSIS — F3181 Bipolar II disorder: Secondary | ICD-10-CM

## 2019-06-13 DIAGNOSIS — F4310 Post-traumatic stress disorder, unspecified: Secondary | ICD-10-CM

## 2019-06-13 DIAGNOSIS — F331 Major depressive disorder, recurrent, moderate: Secondary | ICD-10-CM

## 2019-06-13 DIAGNOSIS — F411 Generalized anxiety disorder: Secondary | ICD-10-CM | POA: Diagnosis not present

## 2019-06-13 MED ORDER — DIAZEPAM 10 MG PO TABS: 10.0000 mg | ORAL_TABLET | Freq: Two times a day (BID) | ORAL | 0 refills | Status: DC | PRN

## 2019-06-13 MED ORDER — RISPERIDONE 0.5 MG PO TABS: 0.5000 mg | ORAL_TABLET | Freq: Every day | ORAL | 2 refills | Status: DC
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
## 2019-06-15 ENCOUNTER — Other Ambulatory Visit: Payer: Self-pay | Admitting: Family Medicine

## 2019-06-15 DIAGNOSIS — H906 Mixed conductive and sensorineural hearing loss, bilateral: Secondary | ICD-10-CM | POA: Diagnosis not present

## 2019-06-15 DIAGNOSIS — Z9089 Acquired absence of other organs: Secondary | ICD-10-CM | POA: Diagnosis not present

## 2019-06-15 DIAGNOSIS — H6501 Acute serous otitis media, right ear: Secondary | ICD-10-CM | POA: Diagnosis not present

## 2019-06-20 ENCOUNTER — Encounter: Payer: Self-pay | Admitting: Physical Therapy

## 2019-06-20 ENCOUNTER — Other Ambulatory Visit: Payer: Self-pay

## 2019-06-20 ENCOUNTER — Telehealth: Payer: Self-pay | Admitting: Adult Health

## 2019-06-20 ENCOUNTER — Ambulatory Visit: Payer: BLUE CROSS/BLUE SHIELD | Admitting: Physical Therapy

## 2019-06-20 DIAGNOSIS — R293 Abnormal posture: Secondary | ICD-10-CM | POA: Diagnosis not present

## 2019-06-20 DIAGNOSIS — M25521 Pain in right elbow: Secondary | ICD-10-CM

## 2019-06-20 DIAGNOSIS — M5412 Radiculopathy, cervical region: Secondary | ICD-10-CM | POA: Diagnosis not present

## 2019-06-20 DIAGNOSIS — M25531 Pain in right wrist: Secondary | ICD-10-CM

## 2019-06-20 DIAGNOSIS — M542 Cervicalgia: Secondary | ICD-10-CM

## 2019-06-20 NOTE — Telephone Encounter (Signed)
I called and spoke with patient.

## 2019-06-20 NOTE — Telephone Encounter (Signed)
Patient left vm today @9 :05 stating she is having dental work and they need her medication list, then patient went on to say that the Risperdal is making her feel very tired and out of it also having panic attacks please call to advise.
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
## 2019-06-20 NOTE — Telephone Encounter (Signed)
She was having mood issues and not sleeping well - so it may make her a little tired feeling at first. Has her sleep gotten better? Are the panic attacks worse with Risperdal?. She should have the medications she's prescribed by [place].

## 2019-06-22 ENCOUNTER — Ambulatory Visit: Payer: BLUE CROSS/BLUE SHIELD

## 2019-06-26 ENCOUNTER — Other Ambulatory Visit: Payer: Self-pay

## 2019-06-26 ENCOUNTER — Telehealth: Payer: Self-pay | Admitting: Adult Health

## 2019-06-26 DIAGNOSIS — H6521 Chronic serous otitis media, right ear: Secondary | ICD-10-CM | POA: Diagnosis not present

## 2019-06-26 DIAGNOSIS — F411 Generalized anxiety disorder: Secondary | ICD-10-CM

## 2019-06-26 MED ORDER — DIAZEPAM 10 MG PO TABS: 10.0000 mg | ORAL_TABLET | Freq: Two times a day (BID) | ORAL | 0 refills | Status: DC | PRN

## 2019-06-26 NOTE — Telephone Encounter (Signed)
Requesting a refill on her Diazepam 10mg . Scheduled for day surgery at 1pm. Requesting this because she only be be receiving numbing spray for her ears. [facility] [place] Any questions please call her.

## 2019-06-26 NOTE — Telephone Encounter (Signed)
yes

## 2019-06-26 NOTE — Telephone Encounter (Signed)
Will pend for approval from provider

## 2019-06-27 ENCOUNTER — Telehealth: Payer: Self-pay | Admitting: Adult Health

## 2019-06-27 ENCOUNTER — Ambulatory Visit: Payer: BLUE CROSS/BLUE SHIELD | Attending: Neurological Surgery | Admitting: Physical Therapy

## 2019-06-27 ENCOUNTER — Other Ambulatory Visit: Payer: Self-pay

## 2019-06-27 ENCOUNTER — Encounter: Payer: Self-pay | Admitting: Physical Therapy

## 2019-06-27 DIAGNOSIS — R293 Abnormal posture: Secondary | ICD-10-CM | POA: Diagnosis not present

## 2019-06-27 DIAGNOSIS — M5412 Radiculopathy, cervical region: Secondary | ICD-10-CM | POA: Insufficient documentation

## 2019-06-27 DIAGNOSIS — M25531 Pain in right wrist: Secondary | ICD-10-CM | POA: Diagnosis not present

## 2019-06-27 NOTE — Telephone Encounter (Signed)
Patient received #30 on 10/22 of Valium, 12 days since last refill. Received a 15 day supply

## 2019-06-27 NOTE — Telephone Encounter (Signed)
Yes this was sent in yesterday.

## 2019-06-27 NOTE — Telephone Encounter (Signed)
[name] called and left messagea to report that her anxiety is through the roof.  She had ear surgery yesterday and is in pain.  You sent in a prescription for Valium but it is to early to fill so she can't get it.  She needs something today.  Can you please call in something else that she can get filled. Please let her know what can be done.

## 2019-06-27 NOTE — Telephone Encounter (Signed)
[facility] and okay to fill today.
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
## 2019-06-29 ENCOUNTER — Telehealth: Payer: Self-pay | Admitting: Family Medicine

## 2019-06-29 ENCOUNTER — Ambulatory Visit: Payer: BLUE CROSS/BLUE SHIELD | Admitting: Physical Therapy

## 2019-06-29 NOTE — Telephone Encounter (Signed)
Pt says that she is having surgery on Monday. Pt says that she is having shoulder pain and a migraine. Pt says that PCP is aware of pt's concerns. Pt would like to know if provider would send in a pain medication for her?     [id number] [place] [place] [address] [phone] (Phone) [phone] (Fax)

## 2019-06-29 NOTE — Telephone Encounter (Signed)
Patient would like a callback as soon as possible in regards to her pain medication.

## 2019-06-29 NOTE — Telephone Encounter (Signed)
PT called in tears. Having a very hard time right now. Very stressed. Can't get in touch with her doctor and is very frustrated. Wanted to talk mostly, but asked if you have time to please call her. ( I tried to just listen to her and help her out). She has taken her two valium today already.

## 2019-06-29 NOTE — Telephone Encounter (Signed)
What can we do to help? 

## 2019-06-30 ENCOUNTER — Encounter: Payer: Self-pay | Admitting: Family Medicine

## 2019-06-30 ENCOUNTER — Other Ambulatory Visit: Payer: Self-pay

## 2019-06-30 ENCOUNTER — Ambulatory Visit (INDEPENDENT_AMBULATORY_CARE_PROVIDER_SITE_OTHER): Payer: BLUE CROSS/BLUE SHIELD | Admitting: Family Medicine

## 2019-06-30 DIAGNOSIS — G8929 Other chronic pain: Secondary | ICD-10-CM | POA: Diagnosis not present

## 2019-06-30 DIAGNOSIS — K0889 Other specified disorders of teeth and supporting structures: Secondary | ICD-10-CM

## 2019-06-30 DIAGNOSIS — M542 Cervicalgia: Secondary | ICD-10-CM

## 2019-06-30 MED ORDER — HYDROCODONE-ACETAMINOPHEN 5-325 MG PO TABS: 1.0000 | ORAL_TABLET | ORAL | 0 refills | Status: DC | PRN

## 2019-06-30 NOTE — Telephone Encounter (Signed)
Patient called again please on pain medication  Please advise
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
## 2019-06-30 NOTE — Telephone Encounter (Signed)
Pt called back in to follow up on call back request.    Please advise pt directly.

## 2019-06-30 NOTE — Progress Notes (Signed)
CC: Dental pain  Subjective: Patient is a 58 y.o. female here for a fall. Due to COVID-19 pandemic, we are interacting via telephone. I verified patient's ID using 2 identifiers. Patient agreed to proceed with visit via this method. Patient is at home, I am at office. Patient and I are present for visit.   Pt has dentalgia that is subacute. She has a dental procedure Monday, requesting pain medication. She has a 58 yr old pain contract w [facility], who no longer rx's her narcotics. She has chronic neck pain, PT recommended. She has failed injections and surgery. She would like to see a pain specialist. Is on anticoagulation.   ROS: HEENT: +dental pain  Past Medical History:  Diagnosis Date  . Back injury    Chronic  . Cardiomyopathy ([place])    hypertension controlled-helped with edema-Dr Nahser  . Deafness    Left ear  . Degenerative joint disease    with neck surgery  . Depression   . Diastolic dysfunction    with possible mild LVOT gradient  . Family history of adverse reaction to anesthesia    " my daughter has PONV and gets very grumpy"  . Fibromyalgia   . Former cigarette smoker   . Gestational diabetes   . H/O blood clots    due to side effect of medication  . Headache(784.0)    migraines  . Hypertension   . Leg cramps   . Neuropathy   . Pain    chronic   . PTSD (post-traumatic stress disorder)   . Respiratory disorder     Objective: No conversational dyspnea Age appropriate judgment and insight Nml affect and mood  Assessment and Plan: Pain, dental - Plan: HYDROcodone-acetaminophen (NORCO/VICODIN) 5-325 MG tablet  Chronic neck pain - Plan: Ambulatory referral to Pain Clinic  Short course of Norco until surgery. Dental team should be able to rx narcotics. Will speak with the provider providing services if they refuse. Will refer to pain specialist.  F/u prn.  Total time spent: 14 min The patient voiced understanding and agreement to the plan.  Verdel, DO 06/30/19  7:05 PM

## 2019-07-04 ENCOUNTER — Ambulatory Visit: Payer: Self-pay | Admitting: *Deleted

## 2019-07-04 NOTE — Telephone Encounter (Signed)
  Pt called to request to have pain medication called in for her for pain for her mouth and wrist and back. She stated she had had 3 surgeries in the past month. And she had received 2 to 3 days worth of pain med from Dr. Nani Ravens and that she needs more until her surgery.  She also stated that surgeon had called in some pain medication for her but could not get it from the pharmacy.  Pt was told that the office was closed and she would have to call back in the morning to speak with her provider regarding her pain med. Pt got upset and disconnected the call. Routing to LB at [facility] for review.

## 2019-07-05 ENCOUNTER — Other Ambulatory Visit: Payer: Self-pay | Admitting: Adult Health

## 2019-07-05 DIAGNOSIS — F3181 Bipolar II disorder: Secondary | ICD-10-CM

## 2019-07-05 DIAGNOSIS — F411 Generalized anxiety disorder: Secondary | ICD-10-CM

## 2019-07-05 DIAGNOSIS — F4310 Post-traumatic stress disorder, unspecified: Secondary | ICD-10-CM

## 2019-07-05 DIAGNOSIS — F331 Major depressive disorder, recurrent, moderate: Secondary | ICD-10-CM

## 2019-07-05 NOTE — Telephone Encounter (Signed)
I called her this morning and everything has been taken care of.  The pharmacy was very busy last night and did eventually get her prescription filled. The patient stated currently needs nothing from her PCP., but thanks for always being so helpful and available.

## 2019-07-05 NOTE — Telephone Encounter (Signed)
Who is her dentist, what pharmacy did they use, when is her surgery? I thought it was scheduled for yesterday? Why could she not get the rx that her dentist sent in?

## 2019-07-06 ENCOUNTER — Other Ambulatory Visit: Payer: Self-pay | Admitting: Family Medicine

## 2019-07-07 ENCOUNTER — Telehealth: Payer: Self-pay | Admitting: *Deleted

## 2019-07-07 NOTE — Telephone Encounter (Signed)
Triple antibiotic ointment should have anti-inflammatory properties and she should still have pain medication from surgery. Usually this does not require extra opiate medication, if she feels otherwise, she should see someone. I am booked for the rest of the day though. Ty.

## 2019-07-07 NOTE — Telephone Encounter (Signed)
Called informed the patient of PCP instructions. She stated she has an appt in 4 days with Pain management. For now she will follow PCP instructions/wait until she sees pain management. She did want to thank Dr. Nani Ravens for all the help he gives her. She was not feeling well today and very depressed. Stated she had 1/2 a tramadol left/some muscle relaxers and tylenol. Has been applying the antibiotic ointment on her scraps and doing ok with that. She did not want an appt for today. Will keep PCP posted on how she is doing.

## 2019-07-07 NOTE — Telephone Encounter (Signed)
error 

## 2019-07-07 NOTE — Telephone Encounter (Signed)
Copied from [place] [phone]. Topic: General - Other >> Jul 07, 2019 10:20 AM Keene Breath wrote: Reason for CRM: Patient called to ask if the nurse or doctor could send a script for pain medication to her local pharmacy.  Patient had recent surgery and slipped and fell yesterday and has scrapes on her knee and arms.  She is requesting something for pain.  Please advise and call to discuss at [phone]

## 2019-07-10 DIAGNOSIS — H9211 Otorrhea, right ear: Secondary | ICD-10-CM | POA: Diagnosis not present

## 2019-07-10 DIAGNOSIS — H906 Mixed conductive and sensorineural hearing loss, bilateral: Secondary | ICD-10-CM | POA: Diagnosis not present

## 2019-07-11 ENCOUNTER — Telehealth: Payer: Self-pay | Admitting: Adult Health

## 2019-07-11 ENCOUNTER — Encounter: Payer: Self-pay | Admitting: Adult Health

## 2019-07-11 ENCOUNTER — Other Ambulatory Visit: Payer: Self-pay

## 2019-07-11 ENCOUNTER — Ambulatory Visit (INDEPENDENT_AMBULATORY_CARE_PROVIDER_SITE_OTHER): Payer: BLUE CROSS/BLUE SHIELD | Admitting: Adult Health

## 2019-07-11 DIAGNOSIS — F3181 Bipolar II disorder: Secondary | ICD-10-CM | POA: Diagnosis not present

## 2019-07-11 DIAGNOSIS — F331 Major depressive disorder, recurrent, moderate: Secondary | ICD-10-CM | POA: Diagnosis not present

## 2019-07-11 DIAGNOSIS — F411 Generalized anxiety disorder: Secondary | ICD-10-CM

## 2019-07-11 DIAGNOSIS — F4310 Post-traumatic stress disorder, unspecified: Secondary | ICD-10-CM | POA: Diagnosis not present

## 2019-07-11 NOTE — Progress Notes (Signed)
[facility] MD/PA/NP Initial Note  07/11/2019 6:46 PM EVVA DIN  MRN:  [id number]  Chief Complaint:   HPI:   Hearing impaired  Referred by PCP - GAD  Describes mood today as "ok". Pleasant. Decreased tearfulness. Mood symptoms - continues to report depression, anxiety, and irritability. Stating "it is better with the Risperdal". Was drowsy at first, but "that has passed". Would like to increase dose. Would like to increase dose. Felt "groggy" initially, but it has "passed". Had surgery recently - stating "I was in so much pain I didn't know what to do". Was not prescribed pain medication initially. Also stating "I eventually got something for pain and feels that it was "helpful". Improved interest and motivation. Taking medications as prescribed.  Energy levels decreased since "surgery". Active, does not have a regular exercise routine. Walking some days - keeping up with steps. Disabled. Enjoys some usual interests and activities. Married. Lives with husband. Spending time with family. Appetite adequate. Weight gain - 145.  Sleeps well most nights. Averages 6 to 8  hours. Focus and concentration stable. Completing tasks. Managing aspects of household.  Denies SI or HI. Denies AH or VH.  Previous medications - Abilify, Wellbutrin, Gabapentin, Lyrica, Prozac   Visit Diagnosis:    ICD-10-CM   1. Generalized anxiety disorder  F41.1   2. Major depressive disorder, recurrent episode, moderate (HCC)  F33.1   3. Bipolar II disorder ([place])  F31.81   4. PTSD (post-traumatic stress disorder)  F43.10     Past Psychiatric History: Admitted at 7 for running away.   Past Medical History:  Past Medical History:  Diagnosis Date  . Back injury    Chronic  . Cardiomyopathy ([place])    hypertension controlled-helped with edema-Dr Nahser  . Deafness    Left ear  . Degenerative joint disease    with neck surgery  . Depression   . Diastolic dysfunction    with possible mild LVOT gradient   . Family history of adverse reaction to anesthesia    " my daughter has PONV and gets very grumpy"  . Fibromyalgia   . Former cigarette smoker   . Gestational diabetes   . H/O blood clots    due to side effect of medication  . Headache(784.0)    migraines  . Hypertension   . Leg cramps   . Neuropathy   . Pain    chronic   . PTSD (post-traumatic stress disorder)   . Respiratory disorder     Past Surgical History:  Procedure Laterality Date  . ABDOMINAL HYSTERECTOMY     partial  . CARDIAC CATHETERIZATION     Ejection Fraction 65-70%  . CERVICAL DISC ARTHROPLASTY N/A 02/21/2016   Procedure: Cervical three-four artificial disc replacement;  Surgeon: Kristeen Miss, MD;  Location: [facility];  Service: Neurosurgery;  Laterality: N/A;  . CERVICAL FUSION     C5-6  . 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 HERNIA REPAIR     " as a baby"  . NECK SURGERY    . OTHER SURGICAL HISTORY     History of cervical and lumbar disk surgery  . polypectomies     tubes  . POSTERIOR CERVICAL LAMINECTOMY WITH MET- RX Left 06/13/2018   Procedure: Left Cervical three-four Laminotomy/foraminotomy;  Surgeon: Kristeen Miss, MD;  Location: [place];  Service: Neurosurgery;  Laterality: Left;  . TONSILLECTOMY AND ADENOIDECTOMY      Family Psychiatric History:  Father - Bipolar - manic depressive  Family History:  Family History  Problem Relation Age of Onset  . Coronary artery disease Father   . Parkinson's disease Mother   . Cancer Sister        breast  . Diabetes Sister   . Diabetes Brother     Social History:  Social History   Socioeconomic History  . Marital status: Married    Spouse name: [name]  . Number of children: 2  . Years of education: 17  . Highest education level: Not on file  Occupational History    Comment: homemaker  Social Needs  . Financial resource strain: Not on file  . Food insecurity    Worry: Not on file    Inability: Not on file  .  Transportation needs    Medical: Not on file    Non-medical: Not on file  Tobacco Use  . Smoking status: Current Some Day Smoker    Packs/day: 1.00    Years: 25.00    Pack years: 25.00    Types: Cigarettes  . Smokeless tobacco: Never Used  Substance and Sexual Activity  . Alcohol use: Yes    Comment: very rare  . Drug use: No  . Sexual activity: Not on file  Lifestyle  . Physical activity    Days per week: Not on file    Minutes per session: Not on file  . Stress: Not on file  Relationships  . Social Herbalist on phone: Not on file    Gets together: Not on file    Attends religious service: Not on file    Active member of club or organization: Not on file    Attends meetings of clubs or organizations: Not on file    Relationship status: Not on file  Other Topics Concern  . Not on file  Social History Narrative   Patient is married [name]) and lives at home with her husband.   Patient has two adult children.   Patient is currently not working.   Patient has a high school education.   Patient is right-handed.   Patient drinks 1-2 cups of coffee per day.    Allergies:  Allergies  Allergen Reactions  . Morphine And Related Other (See Comments)    "Didn't agree with me/wasn't very effective.."  . Lyrica [Pregabalin] Rash  . Neurontin [Gabapentin] Other (See Comments)    Left patient sleepy all the time    Metabolic Disorder Labs: No results found for: HGBA1C, MPG No results found for: PROLACTIN Lab Results  Component Value Date   CHOL 173 06/19/2016   TRIG 150 (H) 06/19/2016   HDL 47 06/19/2016   CHOLHDL 3.7 06/19/2016   VLDL 30 06/19/2016   LDLCALC 96 06/19/2016   LDLCALC 115 12/19/2015     Therapeutic Level Labs: No results found for: LITHIUM No results found for: VALPROATE No components found for:  CBMZ  Current Medications: Current Outpatient Medications  Medication Sig Dispense Refill  . acetaminophen (TYLENOL) 500 MG tablet Take 1,000  mg by mouth every 6 (six) hours as needed for mild pain or headache.     . bisacodyl (DULCOLAX) 5 MG EC tablet Take 5 mg by mouth 2 (two) times daily as needed for moderate constipation.     . busPIRone (BUSPAR) 15 MG tablet TAKE 1 TABLET BY MOUTH THREE TIMES A DAY 270 tablet 1  . citalopram (CELEXA) 20 MG tablet TAKE 1 TABLET BY MOUTH EVERY DAY  90 tablet 1  . clobetasol ointment (TEMOVATE) 0.05 % APPLY TO AFFECTED AREA TWICE A DAY (Patient not taking: Reported on 06/12/2019) 30 g 1  . diazepam (VALIUM) 10 MG tablet Take 1 tablet (10 mg total) by mouth every 12 (twelve) hours as needed for anxiety. 60 tablet 0  . hydrochlorothiazide (HYDRODIURIL) 25 MG tablet TAKE 1 TABLET(25 MG) BY MOUTH DAILY 90 tablet 3  . HYDROcodone-acetaminophen (NORCO/VICODIN) 5-325 MG tablet Take 1 tablet by mouth as needed. 10 tablet 0  . hydrOXYzine (ATARAX/VISTARIL) 25 MG tablet TAKE 3 TO 4 TABLETS BY MOUTH EVERY 8 HOURS AS NEEDED FOR ANXIETY. MAX AMOUNT PER INSURANCE (Patient not taking: Reported on 06/12/2019) 360 tablet 2  . levothyroxine (SYNTHROID) 50 MCG tablet TAKE 1 TABLET BY MOUTH EVERY DAY 90 tablet 0  . loratadine (CLARITIN) 10 MG tablet Take 10 mg by mouth 2 (two) times daily as needed (for seasonal allergies).     . Magnesium 500 MG TABS Take 500 mg by mouth daily.    . meclizine (ANTIVERT) 25 MG tablet Take 1 tablet (25 mg total) by mouth 3 (three) times daily as needed for dizziness. 30 tablet 3  . methocarbamol (ROBAXIN) 500 MG tablet Take 1 tablet by mouth as needed for muscle spasms.    . metoprolol tartrate (LOPRESSOR) 25 MG tablet Take 1 tablet (25 mg total) by mouth 2 (two) times daily. 180 tablet 3  . naproxen (NAPROSYN) 500 MG tablet TAKE 1 TAB DAILY AS NEEDED FOR PAIN. USE SPARINGLY. 30 tablet 1  . risperiDONE (RISPERDAL) 0.5 MG tablet TAKE 1 TABLET BY MOUTH AT BEDTIME. 90 tablet 1  . tiZANidine (ZANAFLEX) 4 MG tablet Take 4 mg by mouth 3 (three) times daily.    . traMADol (ULTRAM) 50 MG tablet  Take 50 mg by mouth every 6 (six) hours as needed for pain.    . vitamin B-12 (CYANOCOBALAMIN) 500 MCG tablet Take 500 mcg by mouth daily.    Alveda Reasons 20 MG TABS tablet TAKE 1 TABLET (20 MG TOTAL) BY MOUTH DAILY WITH SUPPER. 90 tablet 0   No current facility-administered medications for this visit.     Medication Side Effects: none  Orders placed this visit:  No orders of the defined types were placed in this encounter.  Psychiatric Specialty Exam:  Review of Systems  Musculoskeletal: Positive for falls, joint pain, myalgias and neck pain.  Neurological: Positive for weakness. Negative for tremors.  Psychiatric/Behavioral: Positive for depression. The patient is nervous/anxious.     There were no vitals taken for this visit.There is no height or weight on file to calculate BMI.  General Appearance: Neat and Well Groomed  Eye Contact:  Good  Speech:  Clear and Coherent  Volume:  Normal  Mood:  Euthymic  Affect:  Congruent  Thought Process:  Coherent and Descriptions of Associations: Intact  Orientation:  Full (Time, Place, and Person)  Thought Content: Logical   Suicidal Thoughts:  No  Homicidal Thoughts:  No  Memory:  WNL  Judgement:  Good  Insight:  Good  Psychomotor Activity:  Normal  Concentration:  Concentration: Good  Recall:  Good  Fund of Knowledge: Good  Language: Good  Assets:  Communication Skills Desire for Improvement Financial Resources/Insurance Housing Intimacy Leisure Time Physical Health Resilience Social Support Talents/Skills Transportation Vocational/Educational  ADL's:  Intact  Cognition: WNL  Prognosis:  Good   Screenings:   Receiving Psychotherapy: No   Treatment Plan/Recommendations: No - has seen a therapist in the  past.  Plan:  Buspar 32m TID Valium 115mBID Celexa 2059maily Increase Risperdal 0.5mg53m 1mg 58miscussed over taking medications - Valium. Patient initially saying she was out of Valium and picked it up 2 weeks  ago. When discussed felt like she wasn't sure, but was not over taking. Plans to go home and recount. Asking about Clonazepam and Xanax as alternatives. Will not make any further changes or addanother benzodiazepene.  Plans to see a pain manager.   Difficulties with hearing - impaired.  Set up with therapy   Patient advised to contact office with any questions, adverse effects, or acute worsening in signs and symptoms.  Discussed potential benefits, risk, and side effects of benzodiazepines to include potential risk of tolerance and dependence, as well as possible drowsiness.  Advised patient not to drive if experiencing drowsiness and to take lowest possible effective dose to minimize risk of dependence and tolerance.  Discussed potential metabolic side effects associated with atypical antipsychotics, as well as potential risk for movement side effects. Advised pt to contact office if movement side effects occur.    ReginAloha Gell

## 2019-07-11 NOTE — Telephone Encounter (Signed)
She just picked up 60 on 06/26/2019.

## 2019-07-11 NOTE — Telephone Encounter (Signed)
Pt was seen today,but failed to mention she  lost the last few Valium's she had. She would like for [facility] to call her as soon as possible to discuss a refill or prescribe something else.

## 2019-07-12 ENCOUNTER — Other Ambulatory Visit: Payer: Self-pay | Admitting: Adult Health

## 2019-07-12 DIAGNOSIS — Z79899 Other long term (current) drug therapy: Secondary | ICD-10-CM | POA: Diagnosis not present

## 2019-07-12 DIAGNOSIS — F411 Generalized anxiety disorder: Secondary | ICD-10-CM

## 2019-07-12 DIAGNOSIS — F3181 Bipolar II disorder: Secondary | ICD-10-CM

## 2019-07-12 DIAGNOSIS — F17210 Nicotine dependence, cigarettes, uncomplicated: Secondary | ICD-10-CM | POA: Diagnosis not present

## 2019-07-12 DIAGNOSIS — F331 Major depressive disorder, recurrent, moderate: Secondary | ICD-10-CM

## 2019-07-12 DIAGNOSIS — F4310 Post-traumatic stress disorder, unspecified: Secondary | ICD-10-CM

## 2019-07-12 MED ORDER — RISPERIDONE 1 MG PO TABS: ORAL_TABLET | ORAL | 0 refills | Status: DC

## 2019-07-13 DIAGNOSIS — H6241 Otitis externa in other diseases classified elsewhere, right ear: Secondary | ICD-10-CM | POA: Diagnosis not present

## 2019-07-13 DIAGNOSIS — B369 Superficial mycosis, unspecified: Secondary | ICD-10-CM | POA: Insufficient documentation

## 2019-07-25 ENCOUNTER — Other Ambulatory Visit: Payer: Self-pay | Admitting: Adult Health

## 2019-07-25 DIAGNOSIS — Z79899 Other long term (current) drug therapy: Secondary | ICD-10-CM | POA: Diagnosis not present

## 2019-07-25 DIAGNOSIS — F17210 Nicotine dependence, cigarettes, uncomplicated: Secondary | ICD-10-CM | POA: Diagnosis not present

## 2019-07-25 DIAGNOSIS — M542 Cervicalgia: Secondary | ICD-10-CM | POA: Diagnosis not present

## 2019-07-25 DIAGNOSIS — F411 Generalized anxiety disorder: Secondary | ICD-10-CM

## 2019-07-26 ENCOUNTER — Other Ambulatory Visit: Payer: Self-pay | Admitting: Adult Health

## 2019-07-26 DIAGNOSIS — F411 Generalized anxiety disorder: Secondary | ICD-10-CM

## 2019-07-26 NOTE — Telephone Encounter (Signed)
Patient called and said that she needs a refill on her valium . She said that you told her that you would give her a refill on December 1st

## 2019-07-27 ENCOUNTER — Other Ambulatory Visit: Payer: Self-pay | Admitting: Adult Health

## 2019-07-27 DIAGNOSIS — H90A31 Mixed conductive and sensorineural hearing loss, unilateral, right ear with restricted hearing on the contralateral side: Secondary | ICD-10-CM | POA: Diagnosis not present

## 2019-07-27 DIAGNOSIS — H6981 Other specified disorders of Eustachian tube, right ear: Secondary | ICD-10-CM | POA: Diagnosis not present

## 2019-07-27 DIAGNOSIS — H60391 Other infective otitis externa, right ear: Secondary | ICD-10-CM | POA: Diagnosis not present

## 2019-07-27 DIAGNOSIS — H6061 Unspecified chronic otitis externa, right ear: Secondary | ICD-10-CM | POA: Insufficient documentation

## 2019-07-27 NOTE — Telephone Encounter (Signed)
Left patient voicemail with information and to call back

## 2019-07-28 ENCOUNTER — Other Ambulatory Visit: Payer: Self-pay | Admitting: Psychiatry

## 2019-07-28 DIAGNOSIS — F411 Generalized anxiety disorder: Secondary | ICD-10-CM

## 2019-07-28 MED ORDER — DIAZEPAM 10 MG PO TABS: 10.0000 mg | ORAL_TABLET | Freq: Two times a day (BID) | ORAL | 0 refills | Status: DC | PRN

## 2019-07-28 NOTE — Telephone Encounter (Signed)
PDMP shows only hydrocodone and diazepam.  We will give her enough diazepam to get her into next week.

## 2019-07-28 NOTE — Telephone Encounter (Signed)
Pt. Made aware.

## 2019-08-08 ENCOUNTER — Ambulatory Visit: Payer: BLUE CROSS/BLUE SHIELD | Admitting: Adult Health

## 2019-08-09 ENCOUNTER — Ambulatory Visit: Payer: BLUE CROSS/BLUE SHIELD | Admitting: Psychiatry

## 2019-08-09 ENCOUNTER — Encounter: Payer: Self-pay | Admitting: Adult Health

## 2019-08-09 ENCOUNTER — Ambulatory Visit (INDEPENDENT_AMBULATORY_CARE_PROVIDER_SITE_OTHER): Payer: BLUE CROSS/BLUE SHIELD | Admitting: Adult Health

## 2019-08-09 ENCOUNTER — Other Ambulatory Visit: Payer: Self-pay

## 2019-08-09 DIAGNOSIS — F4310 Post-traumatic stress disorder, unspecified: Secondary | ICD-10-CM

## 2019-08-09 DIAGNOSIS — F3181 Bipolar II disorder: Secondary | ICD-10-CM | POA: Diagnosis not present

## 2019-08-09 DIAGNOSIS — F331 Major depressive disorder, recurrent, moderate: Secondary | ICD-10-CM | POA: Diagnosis not present

## 2019-08-09 DIAGNOSIS — F411 Generalized anxiety disorder: Secondary | ICD-10-CM

## 2019-08-09 MED ORDER — RISPERIDONE 1 MG PO TABS: ORAL_TABLET | ORAL | 1 refills | Status: DC

## 2019-08-09 MED ORDER — DIAZEPAM 10 MG PO TABS: 10.0000 mg | ORAL_TABLET | Freq: Two times a day (BID) | ORAL | 2 refills | Status: DC | PRN

## 2019-08-09 MED ORDER — CITALOPRAM HYDROBROMIDE 20 MG PO TABS: 20.0000 mg | ORAL_TABLET | Freq: Every day | ORAL | 1 refills | Status: DC

## 2019-08-09 MED ORDER — BUSPIRONE HCL 15 MG PO TABS: 15.0000 mg | ORAL_TABLET | Freq: Three times a day (TID) | ORAL | 1 refills | Status: DC

## 2019-08-09 NOTE — Progress Notes (Signed)
Jeanette GustHeather A Craun [id number] 1961/07/09 58 y.o.  Subjective:   Patient ID:  [name] is a 58 y.o. (DOB 1961/07/09) female.  Chief Complaint: No chief complaint on file.   HPI [name] presents to the office today for follow-up of MDD, GAD, PTSD, BPD 2.   Hearing impaired  Referred by PCP - GAD  Describes mood today as "ok". Pleasant. Denies tearfulness. Mood symptoms - decreased depression. Having increased anxiety and muscle spasms. Irritable at times. Sleeping better at night. Feels like Risperdal has "balanced" her out. Feels "clear headed".  Having issues with pain doctor. Stating "I don't like his bedside manor". Notes he decreased the Robaxin from 4 tablets daily to 2 tablets daily. Continues to have anxiety - "it's still there". Hearing impaired. Had a "good" Thanksgiving with family. Looking forward to Christmas - "I plan to be into it". Working with pain management. Improved interest and motivation. Taking medications as prescribed.  Energy levels improved. Active, does not have a regular exercise routine. Walking some days. Disabled. Enjoys some usual interests and activities. Married. Lives with husband and their 3 dogs. Helping out with grandson. Spending time with family. Appetite adequate. Weight gain - 145.  Sleeps well most nights. Averages 7 hours. Focus and concentration stable. Completing tasks. Managing aspects of household.  Denies SI or HI. Denies AH or VH.  Previous medications - Abilify, Wellbutrin, Gabapentin, Lyrica, Prozac    Review of Systems:  Review of Systems  Musculoskeletal: Negative for gait problem.  Neurological: Negative for tremors.  Psychiatric/Behavioral:       Please refer to HPI    Medications: I have reviewed the patient's current medications.  Current Outpatient Medications  Medication Sig Dispense Refill  . acetaminophen (TYLENOL) 500 MG tablet Take 1,000 mg by mouth every 6 (six) hours as needed for mild pain or  headache.     . bisacodyl (DULCOLAX) 5 MG EC tablet Take 5 mg by mouth 2 (two) times daily as needed for moderate constipation.     . busPIRone (BUSPAR) 15 MG tablet Take 1 tablet (15 mg total) by mouth 3 (three) times daily. 270 tablet 1  . citalopram (CELEXA) 20 MG tablet Take 1 tablet (20 mg total) by mouth daily. 90 tablet 1  . clobetasol ointment (TEMOVATE) 0.05 % APPLY TO AFFECTED AREA TWICE A DAY (Patient not taking: Reported on 06/12/2019) 30 g 1  . diazepam (VALIUM) 10 MG tablet Take 1 tablet (10 mg total) by mouth every 12 (twelve) hours as needed for anxiety. 60 tablet 2  . hydrochlorothiazide (HYDRODIURIL) 25 MG tablet TAKE 1 TABLET(25 MG) BY MOUTH DAILY 90 tablet 3  . HYDROcodone-acetaminophen (NORCO/VICODIN) 5-325 MG tablet Take 1 tablet by mouth as needed. 10 tablet 0  . hydrOXYzine (ATARAX/VISTARIL) 25 MG tablet TAKE 3 TO 4 TABLETS BY MOUTH EVERY 8 HOURS AS NEEDED FOR ANXIETY. MAX AMOUNT PER INSURANCE (Patient not taking: Reported on 06/12/2019) 360 tablet 2  . levothyroxine (SYNTHROID) 50 MCG tablet TAKE 1 TABLET BY MOUTH EVERY DAY 90 tablet 0  . loratadine (CLARITIN) 10 MG tablet Take 10 mg by mouth 2 (two) times daily as needed (for seasonal allergies).     . Magnesium 500 MG TABS Take 500 mg by mouth daily.    . meclizine (ANTIVERT) 25 MG tablet Take 1 tablet (25 mg total) by mouth 3 (three) times daily as needed for dizziness. 30 tablet 3  . methocarbamol (ROBAXIN) 500 MG tablet Take 1 tablet by mouth as  needed for muscle spasms.    . metoprolol tartrate (LOPRESSOR) 25 MG tablet Take 1 tablet (25 mg total) by mouth 2 (two) times daily. 180 tablet 3  . naproxen (NAPROSYN) 500 MG tablet TAKE 1 TAB DAILY AS NEEDED FOR PAIN. USE SPARINGLY. 30 tablet 1  . risperiDONE (RISPERDAL) 1 MG tablet Take one tablet daily. 90 tablet 1  . tiZANidine (ZANAFLEX) 4 MG tablet Take 4 mg by mouth 3 (three) times daily.    . traMADol (ULTRAM) 50 MG tablet Take 50 mg by mouth every 6 (six) hours as  needed for pain.    . vitamin B-12 (CYANOCOBALAMIN) 500 MCG tablet Take 500 mcg by mouth daily.    Alveda Reasons 20 MG TABS tablet TAKE 1 TABLET (20 MG TOTAL) BY MOUTH DAILY WITH SUPPER. 90 tablet 0   No current facility-administered medications for this visit.    Medication Side Effects: None  Allergies:  Allergies  Allergen Reactions  . Morphine And Related Other (See Comments)    "Didn't agree with me/wasn't very effective.."  . Lyrica [Pregabalin] Rash  . Neurontin [Gabapentin] Other (See Comments)    Left patient sleepy all the time    Past Medical History:  Diagnosis Date  . Back injury    Chronic  . Cardiomyopathy ([place])    hypertension controlled-helped with edema-Dr Nahser  . Deafness    Left ear  . Degenerative joint disease    with neck surgery  . Depression   . Diastolic dysfunction    with possible mild LVOT gradient  . Family history of adverse reaction to anesthesia    " my daughter has PONV and gets very grumpy"  . Fibromyalgia   . Former cigarette smoker   . Gestational diabetes   . H/O blood clots    due to side effect of medication  . Headache(784.0)    migraines  . Hypertension   . Leg cramps   . Neuropathy   . Pain    chronic   . PTSD (post-traumatic stress disorder)   . Respiratory disorder     Family History  Problem Relation Age of Onset  . Coronary artery disease Father   . Parkinson's disease Mother   . Cancer Sister        breast  . Diabetes Sister   . Diabetes Brother     Social History   Socioeconomic History  . Marital status: Married    Spouse name: [name]  . Number of children: 2  . Years of education: 44  . Highest education level: Not on file  Occupational History    Comment: homemaker  Tobacco Use  . Smoking status: Current Some Day Smoker    Packs/day: 1.00    Years: 25.00    Pack years: 25.00    Types: Cigarettes  . Smokeless tobacco: Never Used  Substance and Sexual Activity  . Alcohol use: Yes    Comment:  very rare  . Drug use: No  . Sexual activity: Not on file  Other Topics Concern  . Not on file  Social History Narrative   Patient is married [name]) and lives at home with her husband.   Patient has two adult children.   Patient is currently not working.   Patient has a high school education.   Patient is right-handed.   Patient drinks 1-2 cups of coffee per day.   Social Determinants of Health   Financial Resource Strain:   . Difficulty of Paying Living Expenses: Not  on file  Food Insecurity:   . Worried About Programme researcher, broadcasting/film/video in the Last Year: Not on file  . Ran Out of Food in the Last Year: Not on file  Transportation Needs:   . Lack of Transportation (Medical): Not on file  . Lack of Transportation (Non-Medical): Not on file  Physical Activity:   . Days of Exercise per Week: Not on file  . Minutes of Exercise per Session: Not on file  Stress:   . Feeling of Stress : Not on file  Social Connections:   . Frequency of Communication with Friends and Family: Not on file  . Frequency of Social Gatherings with Friends and Family: Not on file  . Attends Religious Services: Not on file  . Active Member of Clubs or Organizations: Not on file  . Attends Banker Meetings: Not on file  . Marital Status: Not on file  Intimate Partner Violence:   . Fear of Current or Ex-Partner: Not on file  . Emotionally Abused: Not on file  . Physically Abused: Not on file  . Sexually Abused: Not on file    Past Medical History, Surgical history, Social history, and Family history were reviewed and updated as appropriate.   Please see review of systems for further details on the patient's review from today.   Objective:   Physical Exam:  There were no vitals taken for this visit.  Physical Exam Constitutional:      General: She is not in acute distress.    Appearance: She is well-developed.  Musculoskeletal:        General: No deformity.  Neurological:     Mental  Status: She is alert and oriented to person, place, and time.     Coordination: Coordination normal.  Psychiatric:        Attention and Perception: Attention and perception normal. She does not perceive auditory or visual hallucinations.        Mood and Affect: Mood normal. Mood is not anxious or depressed. Affect is not labile, blunt, angry or inappropriate.        Speech: Speech normal.        Behavior: Behavior normal.        Thought Content: Thought content normal. Thought content is not paranoid or delusional. Thought content does not include homicidal or suicidal ideation. Thought content does not include homicidal or suicidal plan.        Cognition and Memory: Cognition and memory normal.        Judgment: Judgment normal.     Comments: Insight intact     Lab Review:     Component Value Date/Time   NA 131 (L) 05/16/2019 0801   K 4.2 05/16/2019 0801   CL 98 05/16/2019 0801   CO2 26 05/16/2019 0801   GLUCOSE 99 05/16/2019 0801   BUN 10 05/16/2019 0801   CREATININE 0.75 05/16/2019 0801   CREATININE 0.87 12/19/2015 1700   CALCIUM 9.4 05/16/2019 0801   PROT 6.6 08/04/2018 1054   ALBUMIN 4.0 08/04/2018 1054   AST 18 08/04/2018 1054   ALT 13 08/04/2018 1054   ALKPHOS 72 08/04/2018 1054   BILITOT 0.3 08/04/2018 1054   GFRNONAA >60 06/21/2018 1853   GFRAA >60 06/21/2018 1853       Component Value Date/Time   WBC 9.6 05/16/2019 0801   RBC 4.54 05/16/2019 0801   HGB 10.1 (L) 05/16/2019 0801   HCT 32.1 (L) 05/16/2019 0801   PLT 526.0 (H) 05/16/2019  0801   MCV 70.9 (L) 05/16/2019 0801   MCH 29.6 06/21/2018 1853   MCHC 31.5 05/16/2019 0801   RDW 20.3 (H) 05/16/2019 0801   LYMPHSABS 2.4 09/27/2014 1325   MONOABS 0.4 09/27/2014 1325   EOSABS 0.0 09/27/2014 1325   BASOSABS 0.0 09/27/2014 1325    No results found for: POCLITH, LITHIUM   No results found for: PHENYTOIN, PHENOBARB, VALPROATE, CBMZ   .res Assessment: Plan:    Treatment Plan/Recommendations: No - has seen  a therapist in the past.  Plan:  Buspar  TID Valium  BID Celexa  daily Risperdal  at bedtime  Patient advised she did not overtake valium. She found bottle with medication in it and has taken for past 2 weeks. Followed by pain manager.   Difficulties with hearing - impaired.  Set up with therapy   Patient advised to contact office with any questions, adverse effects, or acute worsening in signs and symptoms.  Discussed potential benefits, risk, and side effects of benzodiazepines to include potential risk of tolerance and dependence, as well as possible drowsiness.  Advised patient not to drive if experiencing drowsiness and to take lowest possible effective dose to minimize risk of dependence and tolerance.  Discussed potential metabolic side effects associated with atypical antipsychotics, as well as potential risk for movement side effects. Advised pt to contact office if movement side effects occur.  Diagnoses and all orders for this visit:  Generalized anxiety disorder -     citalopram (CELEXA) 20 MG tablet; Take 1 tablet (20 mg total) by mouth daily. -     risperiDONE (RISPERDAL) 1 MG tablet; Take one tablet daily.  Major depressive disorder, recurrent episode, moderate (HCC) -     citalopram (CELEXA) 20 MG tablet; Take 1 tablet (20 mg total) by mouth daily. -     risperiDONE (RISPERDAL) 1 MG tablet; Take one tablet daily.  Bipolar II disorder (HCC) -     risperiDONE (RISPERDAL) 1 MG tablet; Take one tablet daily.  PTSD (post-traumatic stress disorder) -     risperiDONE (RISPERDAL) 1 MG tablet; Take one tablet daily.  GAD (generalized anxiety disorder) -     busPIRone (BUSPAR) 15 MG tablet; Take 1 tablet (15 mg total) by mouth 3 (three) times daily. -     diazepam (VALIUM) 10 MG tablet; Take 1 tablet (10 mg total) by mouth every 12 (twelve) hours as needed for anxiety.     Please see After Visit Summary for patient specific instructions.  Future  Appointments  Date Time Provider Department Center  08/09/2019  4:00 PM Mathis Fare, [place] [facility] None  09/06/2019  1:00 PM Kaydie Petsch, Thereasa Solo, NP [facility] None  11/14/2019 10:30 AM Carmelia Roller, Jilda Roche, DO [facility]    No orders of the defined types were placed in this encounter.   -------------------------------
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
## 2019-08-28 DIAGNOSIS — Z79899 Other long term (current) drug therapy: Secondary | ICD-10-CM | POA: Diagnosis not present

## 2019-08-28 DIAGNOSIS — M25561 Pain in right knee: Secondary | ICD-10-CM | POA: Diagnosis not present

## 2019-08-28 DIAGNOSIS — M25562 Pain in left knee: Secondary | ICD-10-CM | POA: Diagnosis not present

## 2019-08-28 DIAGNOSIS — F17210 Nicotine dependence, cigarettes, uncomplicated: Secondary | ICD-10-CM | POA: Diagnosis not present

## 2019-08-28 DIAGNOSIS — M542 Cervicalgia: Secondary | ICD-10-CM | POA: Diagnosis not present

## 2019-09-01 ENCOUNTER — Other Ambulatory Visit: Payer: Self-pay | Admitting: Family Medicine

## 2019-09-06 ENCOUNTER — Encounter: Payer: Self-pay | Admitting: Adult Health

## 2019-09-06 ENCOUNTER — Other Ambulatory Visit: Payer: Self-pay

## 2019-09-06 ENCOUNTER — Ambulatory Visit (INDEPENDENT_AMBULATORY_CARE_PROVIDER_SITE_OTHER): Payer: BLUE CROSS/BLUE SHIELD | Admitting: Adult Health

## 2019-09-06 DIAGNOSIS — F331 Major depressive disorder, recurrent, moderate: Secondary | ICD-10-CM | POA: Diagnosis not present

## 2019-09-06 DIAGNOSIS — F4310 Post-traumatic stress disorder, unspecified: Secondary | ICD-10-CM | POA: Diagnosis not present

## 2019-09-06 DIAGNOSIS — F411 Generalized anxiety disorder: Secondary | ICD-10-CM

## 2019-09-06 DIAGNOSIS — F3181 Bipolar II disorder: Secondary | ICD-10-CM | POA: Diagnosis not present

## 2019-09-06 NOTE — Progress Notes (Signed)
[name] [id number] [birth year] 60 y.o.  Subjective:   Patient ID:  [name] is a 59 y.o. (DOB May 04, 1961) female.  Chief Complaint:  Chief Complaint  Patient presents with  . Anxiety  . Depression  . Other    BPD 2  . Trauma    HPI [name] presents to the office today for follow-up of MDD, GAD, PTSD, BPD 2.   Describes mood today as "ok". Pleasant. Denies tearfulness. Mood symptoms - decreased depression, anxiety, and irritability. Stating "I'm making it". Feels more "leveled out". Having muscle spasms lately. Was tapered from 4 to 2 muscle relaxers. Followed by pain manager. Has been taking Risperdal in the mornings - plans to move back to bedtime. Hearing impaired - seeing Audiologist upcoming. Enjoyed the holidays - family visited. She and husband doing well. Improved interest and motivation. Taking medications as prescribed.  Energy levels better some days than others. Active, does not have a regular exercise routine. Has been walking more. Disabled. Enjoys some usual interests and activities. Married. Lives with husband and their 3 dogs. Seeing and talking to children - 1 son and 1 daughter. Helping out with grandson. Spending time with family. Appetite adequate. Weight gain - 150.  Sleeps well most nights. Averages 7 to 8 hours. Having weird dreams most nights. Focus and concentration stable. Completing tasks. Managing aspects of household.  Denies SI or HI. Denies AH or VH.  Previous medications - Abilify, Wellbutrin, Gabapentin, Lyrica, Prozac   Review of Systems:  Review of Systems  Musculoskeletal: Negative for gait problem.  Neurological: Negative for tremors.  Psychiatric/Behavioral:       Please refer to HPI    Medications: I have reviewed the patient's current medications.  Current Outpatient Medications  Medication Sig Dispense Refill  . acetaminophen (TYLENOL) 500 MG tablet Take 1,000 mg by mouth every 6 (six) hours as needed for  mild pain or headache.     . bisacodyl (DULCOLAX) 5 MG EC tablet Take 5 mg by mouth 2 (two) times daily as needed for moderate constipation.     . busPIRone (BUSPAR) 15 MG tablet Take 1 tablet (15 mg total) by mouth 3 (three) times daily. 270 tablet 1  . citalopram (CELEXA) 20 MG tablet Take 1 tablet (20 mg total) by mouth daily. 90 tablet 1  . clobetasol ointment (TEMOVATE) 0.05 % APPLY TO AFFECTED AREA TWICE A DAY (Patient not taking: Reported on 06/12/2019) 30 g 1  . diazepam (VALIUM) 10 MG tablet Take 1 tablet (10 mg total) by mouth every 12 (twelve) hours as needed for anxiety. 60 tablet 2  . hydrochlorothiazide (HYDRODIURIL) 25 MG tablet TAKE 1 TABLET(25 MG) BY MOUTH DAILY 90 tablet 3  . HYDROcodone-acetaminophen (NORCO/VICODIN) 5-325 MG tablet Take 1 tablet by mouth as needed. 10 tablet 0  . hydrOXYzine (ATARAX/VISTARIL) 25 MG tablet TAKE 3 TO 4 TABLETS BY MOUTH EVERY 8 HOURS AS NEEDED FOR ANXIETY. MAX AMOUNT PER INSURANCE (Patient not taking: Reported on 06/12/2019) 360 tablet 2  . levothyroxine (SYNTHROID) 50 MCG tablet TAKE 1 TABLET BY MOUTH EVERY DAY 90 tablet 0  . loratadine (CLARITIN) 10 MG tablet Take 10 mg by mouth 2 (two) times daily as needed (for seasonal allergies).     . Magnesium 500 MG TABS Take 500 mg by mouth daily.    . meclizine (ANTIVERT) 25 MG tablet Take 1 tablet (25 mg total) by mouth 3 (three) times daily as needed for dizziness. 30 tablet 3  .  methocarbamol (ROBAXIN) 500 MG tablet Take 1 tablet by mouth as needed for muscle spasms.    . metoprolol tartrate (LOPRESSOR) 25 MG tablet Take 1 tablet (25 mg total) by mouth 2 (two) times daily. 180 tablet 3  . naproxen (NAPROSYN) 500 MG tablet TAKE 1 TAB DAILY AS NEEDED FOR PAIN. USE SPARINGLY. 30 tablet 1  . risperiDONE (RISPERDAL) 1 MG tablet Take one tablet daily. 90 tablet 1  . tiZANidine (ZANAFLEX) 4 MG tablet Take 4 mg by mouth 3 (three) times daily.    . traMADol (ULTRAM) 50 MG tablet Take 50 mg by mouth every 6  (six) hours as needed for pain.    . vitamin B-12 (CYANOCOBALAMIN) 500 MCG tablet Take 500 mcg by mouth daily.    Carlena Hurl 20 MG TABS tablet TAKE 1 TABLET (20 MG TOTAL) BY MOUTH DAILY WITH SUPPER. 90 tablet 0   No current facility-administered medications for this visit.    Medication Side Effects: None  Allergies:  Allergies  Allergen Reactions  . Morphine And Related Other (See Comments)    "Didn't agree with me/wasn't very effective.."  . Lyrica [Pregabalin] Rash  . Neurontin [Gabapentin] Other (See Comments)    Left patient sleepy all the time    Past Medical History:  Diagnosis Date  . Back injury    Chronic  . Cardiomyopathy ([facility])    hypertension controlled-helped with edema-Dr Nahser  . Deafness    Left ear  . Degenerative joint disease    with neck surgery  . Depression   . Diastolic dysfunction    with possible mild LVOT gradient  . Family history of adverse reaction to anesthesia    " my daughter has PONV and gets very grumpy"  . Fibromyalgia   . Former cigarette smoker   . Gestational diabetes   . H/O blood clots    due to side effect of medication  . Headache(784.0)    migraines  . Hypertension   . Leg cramps   . Neuropathy   . Pain    chronic   . PTSD (post-traumatic stress disorder)   . Respiratory disorder     Family History  Problem Relation Age of Onset  . Coronary artery disease Father   . Parkinson's disease Mother   . Cancer Sister        breast  . Diabetes Sister   . Diabetes Brother     Social History   Socioeconomic History  . Marital status: Married    Spouse name: Reuel Boom  . Number of children: 2  . Years of education: 67  . Highest education level: Not on file  Occupational History    Comment: homemaker  Tobacco Use  . Smoking status: Current Some Day Smoker    Packs/day: 1.00    Years: 25.00    Pack years: 25.00    Types: Cigarettes  . Smokeless tobacco: Never Used  Substance and Sexual Activity  . Alcohol use:  Yes    Comment: very rare  . Drug use: No  . Sexual activity: Not on file  Other Topics Concern  . Not on file  Social History Narrative   Patient is married [name]) and lives at home with her husband.   Patient has two adult children.   Patient is currently not working.   Patient has a high school education.   Patient is right-handed.   Patient drinks 1-2 cups of coffee per day.   Social Determinants of Health   Financial  Resource Strain:   . Difficulty of Paying Living Expenses: Not on file  Food Insecurity:   . Worried About Programme researcher, broadcasting/film/video in the Last Year: Not on file  . Ran Out of Food in the Last Year: Not on file  Transportation Needs:   . Lack of Transportation (Medical): Not on file  . Lack of Transportation (Non-Medical): Not on file  Physical Activity:   . Days of Exercise per Week: Not on file  . Minutes of Exercise per Session: Not on file  Stress:   . Feeling of Stress : Not on file  Social Connections:   . Frequency of Communication with Friends and Family: Not on file  . Frequency of Social Gatherings with Friends and Family: Not on file  . Attends Religious Services: Not on file  . Active Member of Clubs or Organizations: Not on file  . Attends Banker Meetings: Not on file  . Marital Status: Not on file  Intimate Partner Violence:   . Fear of Current or Ex-Partner: Not on file  . Emotionally Abused: Not on file  . Physically Abused: Not on file  . Sexually Abused: Not on file    Past Medical History, Surgical history, Social history, and Family history were reviewed and updated as appropriate.   Please see review of systems for further details on the patient's review from today.   Objective:   Physical Exam:  There were no vitals taken for this visit.  Physical Exam Constitutional:      General: She is not in acute distress.    Appearance: She is well-developed.  Musculoskeletal:        General: No deformity.  Neurological:      Mental Status: She is alert and oriented to person, place, and time.     Coordination: Coordination normal.  Psychiatric:        Attention and Perception: Attention and perception normal. She does not perceive auditory or visual hallucinations.        Mood and Affect: Mood normal. Mood is not anxious or depressed. Affect is not labile, blunt, angry or inappropriate.        Speech: Speech normal.        Behavior: Behavior normal.        Thought Content: Thought content normal. Thought content is not paranoid or delusional. Thought content does not include homicidal or suicidal ideation. Thought content does not include homicidal or suicidal plan.        Cognition and Memory: Cognition and memory normal.        Judgment: Judgment normal.     Comments: Insight intact     Lab Review:     Component Value Date/Time   NA 131 (L) 05/16/2019 0801   K 4.2 05/16/2019 0801   CL 98 05/16/2019 0801   CO2 26 05/16/2019 0801   GLUCOSE 99 05/16/2019 0801   BUN 10 05/16/2019 0801   CREATININE 0.75 05/16/2019 0801   CREATININE 0.87 12/19/2015 1700   CALCIUM 9.4 05/16/2019 0801   PROT 6.6 08/04/2018 1054   ALBUMIN 4.0 08/04/2018 1054   AST 18 08/04/2018 1054   ALT 13 08/04/2018 1054   ALKPHOS 72 08/04/2018 1054   BILITOT 0.3 08/04/2018 1054   GFRNONAA >60 06/21/2018 1853   GFRAA >60 06/21/2018 1853       Component Value Date/Time   WBC 9.6 05/16/2019 0801   RBC 4.54 05/16/2019 0801   HGB 10.1 (L) 05/16/2019 0801  HCT 32.1 (L) 05/16/2019 0801   PLT 526.0 (H) 05/16/2019 0801   MCV 70.9 (L) 05/16/2019 0801   MCH 29.6 06/21/2018 1853   MCHC 31.5 05/16/2019 0801   RDW 20.3 (H) 05/16/2019 0801   LYMPHSABS 2.4 09/27/2014 1325   MONOABS 0.4 09/27/2014 1325   EOSABS 0.0 09/27/2014 1325   BASOSABS 0.0 09/27/2014 1325    No results found for: POCLITH, LITHIUM   No results found for: PHENYTOIN, PHENOBARB, VALPROATE, CBMZ   .res Assessment: Plan:    Treatment Plan/Recommendations: No  - has seen a therapist in the past.  Plan:  Buspar 15mg  TID Valium 10mg  BID Celexa 20mg  daily Risperdal 1mg  at bedtime  Therapy -  Patient advised to contact office with any questions, adverse effects, or acute worsening in signs and symptoms.  Discussed potential benefits, risk, and side effects of benzodiazepines to include potential risk of tolerance and dependence, as well as possible drowsiness.  Advised patient not to drive if experiencing drowsiness and to take lowest possible effective dose to minimize risk of dependence and tolerance.  Discussed potential metabolic side effects associated with atypical antipsychotics, as well as potential risk for movement side effects. Advised pt to contact office if movement side effects occur.   Deon was seen today for anxiety, depression, other and trauma.  Diagnoses and all orders for this visit:  PTSD (post-traumatic stress disorder)  Major depressive disorder, recurrent episode, moderate (HCC)  Bipolar II disorder (HCC)  Generalized anxiety disorder     Please see After Visit Summary for patient specific instructions.  Future Appointments  Date Time Provider Department Center  10/02/2019  3:00 PM , LCSW [facility] None  11/14/2019 10:30 AM Herbert Seta, 11/30/2019, DO [id number]    No orders of the defined types were placed in this encounter.   -------------------------------

## 2019-09-07 DIAGNOSIS — H906 Mixed conductive and sensorineural hearing loss, bilateral: Secondary | ICD-10-CM | POA: Diagnosis not present

## 2019-09-07 DIAGNOSIS — H6981 Other specified disorders of Eustachian tube, right ear: Secondary | ICD-10-CM | POA: Diagnosis not present

## 2019-09-07 DIAGNOSIS — F17210 Nicotine dependence, cigarettes, uncomplicated: Secondary | ICD-10-CM | POA: Diagnosis not present

## 2019-09-07 DIAGNOSIS — H95192 Other disorders following mastoidectomy, left ear: Secondary | ICD-10-CM | POA: Diagnosis not present

## 2019-09-08 ENCOUNTER — Ambulatory Visit: Payer: BLUE CROSS/BLUE SHIELD | Admitting: Psychiatry

## 2019-09-15 DIAGNOSIS — G5603 Carpal tunnel syndrome, bilateral upper limbs: Secondary | ICD-10-CM | POA: Diagnosis not present

## 2019-09-15 DIAGNOSIS — G5623 Lesion of ulnar nerve, bilateral upper limbs: Secondary | ICD-10-CM | POA: Diagnosis not present

## 2019-09-15 DIAGNOSIS — M5412 Radiculopathy, cervical region: Secondary | ICD-10-CM | POA: Diagnosis not present

## 2019-09-28 DIAGNOSIS — Z79899 Other long term (current) drug therapy: Secondary | ICD-10-CM | POA: Diagnosis not present

## 2019-09-28 DIAGNOSIS — M5412 Radiculopathy, cervical region: Secondary | ICD-10-CM | POA: Diagnosis not present

## 2019-09-28 DIAGNOSIS — M25562 Pain in left knee: Secondary | ICD-10-CM | POA: Diagnosis not present

## 2019-09-28 DIAGNOSIS — M542 Cervicalgia: Secondary | ICD-10-CM | POA: Diagnosis not present

## 2019-09-28 DIAGNOSIS — M25561 Pain in right knee: Secondary | ICD-10-CM | POA: Diagnosis not present

## 2019-09-28 DIAGNOSIS — F17210 Nicotine dependence, cigarettes, uncomplicated: Secondary | ICD-10-CM | POA: Diagnosis not present

## 2019-10-02 ENCOUNTER — Other Ambulatory Visit: Payer: Self-pay

## 2019-10-02 ENCOUNTER — Ambulatory Visit (INDEPENDENT_AMBULATORY_CARE_PROVIDER_SITE_OTHER): Payer: BLUE CROSS/BLUE SHIELD | Admitting: Psychiatry

## 2019-10-02 DIAGNOSIS — F411 Generalized anxiety disorder: Secondary | ICD-10-CM

## 2019-10-02 NOTE — Progress Notes (Signed)
[facility] Counselor/Therapist Progress Note  Patient ID: [name], MRN: [id number],    Date: 10/02/2019  Time Spent: 45 minutes   3:15pm to 4:00pm   Treatment Type: Individual Therapy  Reported Symptoms: anxiety "high", depression, sadness over loss of 1 friend, her difficulty hearing is really bothering and is working with a doctor on this.  Very difficult for her to hear even with loud talking.  Mental Status Exam:  Appearance:   Casual     Behavior:  Appropriate and Sharing  Motor:  Normal  Speech/Language:   Normal Rate  Affect:  Depressed and anxious  Mood:  anxious and depressed  Thought process:  goal directed  Thought content:    WNL  Sensory/Perceptual disturbances:    WNL  Orientation:  oriented to person, place, time/date, situation, day of week, month of year and year  Attention:  Good  Concentration:  Good  Memory:  WNL  Fund of knowledge:   Good  Insight:    Good  Judgment:   Good  Impulse Control:  Good   Risk Assessment: Danger to Self:  No Self-injurious Behavior: No Danger to Others: No Duty to Warn:no Physical Aggression / Violence:No  Access to Firearms a concern: No  Gang Involvement:No   Subjective:  Patient in today reporting anxiety high and some depression.  Lost 1 friend/patient recently which has saddened her.  States that she is proud of herself for trying to deal with things in spite of her hearing difficulties.  Husband has become more empathetic.    Interventions: Solution-Oriented/Positive Psychology and Ego-Supportive  Diagnosis:   ICD-10-CM   1. Generalized anxiety disorder  F41.1      Plan of Care: Patient not signing tx plan on computer screen due to [place].  Treatment goals: Treatment goals remain on tx plan as patient works on strategies to achieve her goals. Progress will be noted each visit in "Progress" section of Plan.  Long term goal: Reduce overall level, frequency, and intensity of the anxiety so  that daily functioning is not impaired.  Short term goal: Increase understanding of beliefs and messages that produce the worry and anxiety.  Strategies: 1. Identify, challenge, and replace anxious/negative/fearful self-talk with positive, reality-based, empowering self-talk.  2. Review, repeat, reinforce success.  Progress: Patient in today working on her goals and with continued hearing difficulty, very hard for her to hear in session without therapist speaking very loudly and repeating.  She wanted to continue so we did. Shares that she's been quite upset recently with her friend's death and has another friend who is not expected to live very long. States "everything is causing my anxiety to be higher" and explains that everything means "the world conflicts, [id number], my personal issues, my hearing problems".  "So tired of worrying about Covid and having it affect so much of our lives."  Her hearing is really affecting her and he tries to cope with a sense of humor but it really does bother her.  Discussed her anxiety more and checked in on followup from last session, stating that she did try the meditations, playing with her dogs more, cooking more which she enjoys,  and changing some of her thoughts. Having a tough time with the anxious thoughts and gave a couple examples that we practiced with in session to acknowledge them and their negative impact and replace them with more positive, reality-based, and empowering thought patterns that do not support anxiety/worry.  Patient does understand the  change from one thought pattern to a more positive one, but admits that it's hard to catch herself and do in the moment. She is to practice this in between sessions. Patient is really struggling with her hearing issues and wants to stretch out her next appt to allow more time for her ear to heal and hopefully get her hearing aids soon. Will see again in 4-6 weeks and will call if needed before that time.   Is hoping her ear and hearing will be better by that point. Goal review and some progress noted with patient.  Next appt 4-6 weeks.   Mathis Fare, LCSW

## 2019-10-04 DIAGNOSIS — H02889 Meibomian gland dysfunction of unspecified eye, unspecified eyelid: Secondary | ICD-10-CM | POA: Diagnosis not present

## 2019-10-26 DIAGNOSIS — Z79899 Other long term (current) drug therapy: Secondary | ICD-10-CM | POA: Diagnosis not present

## 2019-10-26 DIAGNOSIS — M25562 Pain in left knee: Secondary | ICD-10-CM | POA: Diagnosis not present

## 2019-10-26 DIAGNOSIS — M25561 Pain in right knee: Secondary | ICD-10-CM | POA: Diagnosis not present

## 2019-10-26 DIAGNOSIS — F17210 Nicotine dependence, cigarettes, uncomplicated: Secondary | ICD-10-CM | POA: Diagnosis not present

## 2019-10-26 DIAGNOSIS — M542 Cervicalgia: Secondary | ICD-10-CM | POA: Diagnosis not present

## 2019-10-26 DIAGNOSIS — M5412 Radiculopathy, cervical region: Secondary | ICD-10-CM | POA: Diagnosis not present

## 2019-10-29 ENCOUNTER — Other Ambulatory Visit: Payer: Self-pay | Admitting: Family Medicine

## 2019-11-01 ENCOUNTER — Other Ambulatory Visit: Payer: Self-pay | Admitting: Adult Health

## 2019-11-01 DIAGNOSIS — F411 Generalized anxiety disorder: Secondary | ICD-10-CM

## 2019-11-14 ENCOUNTER — Ambulatory Visit (INDEPENDENT_AMBULATORY_CARE_PROVIDER_SITE_OTHER): Payer: BLUE CROSS/BLUE SHIELD | Admitting: Family Medicine

## 2019-11-14 ENCOUNTER — Other Ambulatory Visit: Payer: Self-pay

## 2019-11-14 ENCOUNTER — Ambulatory Visit: Payer: BLUE CROSS/BLUE SHIELD | Admitting: Psychiatry

## 2019-11-14 ENCOUNTER — Encounter: Payer: Self-pay | Admitting: Family Medicine

## 2019-11-14 VITALS — BP 120/82 | HR 63 | Temp 96.4°F | Ht 65.5 in | Wt 160.0 lb

## 2019-11-14 DIAGNOSIS — Z1231 Encounter for screening mammogram for malignant neoplasm of breast: Secondary | ICD-10-CM

## 2019-11-14 DIAGNOSIS — Z0000 Encounter for general adult medical examination without abnormal findings: Secondary | ICD-10-CM

## 2019-11-14 DIAGNOSIS — Z1159 Encounter for screening for other viral diseases: Secondary | ICD-10-CM | POA: Diagnosis not present

## 2019-11-14 DIAGNOSIS — M542 Cervicalgia: Secondary | ICD-10-CM

## 2019-11-14 DIAGNOSIS — Z1211 Encounter for screening for malignant neoplasm of colon: Secondary | ICD-10-CM | POA: Diagnosis not present

## 2019-11-14 DIAGNOSIS — Z114 Encounter for screening for human immunodeficiency virus [HIV]: Secondary | ICD-10-CM | POA: Diagnosis not present

## 2019-11-14 DIAGNOSIS — G8929 Other chronic pain: Secondary | ICD-10-CM

## 2019-11-14 DIAGNOSIS — R29898 Other symptoms and signs involving the musculoskeletal system: Secondary | ICD-10-CM | POA: Diagnosis not present

## 2019-11-14 DIAGNOSIS — Z0001 Encounter for general adult medical examination with abnormal findings: Secondary | ICD-10-CM

## 2019-11-14 DIAGNOSIS — R718 Other abnormality of red blood cells: Secondary | ICD-10-CM | POA: Diagnosis not present

## 2019-11-14 LAB — CBC
HCT: 38.9 % (ref 36.0–46.0)
Hemoglobin: 13 g/dL (ref 12.0–15.0)
MCHC: 33.4 g/dL (ref 30.0–36.0)
MCV: 89.4 fl (ref 78.0–100.0)
Platelets: 315 10*3/uL (ref 150.0–400.0)
RBC: 4.34 Mil/uL (ref 3.87–5.11)
RDW: 21.3 % — ABNORMAL HIGH (ref 11.5–15.5)
WBC: 7.3 10*3/uL (ref 4.0–10.5)

## 2019-11-14 LAB — COMPREHENSIVE METABOLIC PANEL
ALT: 13 U/L (ref 0–35)
AST: 14 U/L (ref 0–37)
Albumin: 3.9 g/dL (ref 3.5–5.2)
Alkaline Phosphatase: 69 U/L (ref 39–117)
BUN: 10 mg/dL (ref 6–23)
CO2: 32 mEq/L (ref 19–32)
Calcium: 8.9 mg/dL (ref 8.4–10.5)
Chloride: 99 mEq/L (ref 96–112)
Creatinine, Ser: 0.72 mg/dL (ref 0.40–1.20)
GFR: 83.46 mL/min (ref >60.00)
Glucose, Bld: 71 mg/dL (ref 70–99)
Potassium: 4.9 mEq/L (ref 3.5–5.1)
Sodium: 134 mEq/L — ABNORMAL LOW (ref 135–145)
Total Bilirubin: 0.3 mg/dL (ref 0.2–1.2)
Total Protein: 6.4 g/dL (ref 6.0–8.3)

## 2019-11-14 LAB — LIPID PANEL
Cholesterol: 189 mg/dL (ref 0–200)
HDL: 62.8 mg/dL (ref >39.00)
LDL Cholesterol: 93 mg/dL (ref 0–99)
NonHDL: 125.87
Total CHOL/HDL Ratio: 3
Triglycerides: 164 mg/dL — ABNORMAL HIGH (ref 0.0–149.0)
VLDL: 32.8 mg/dL (ref 0.0–40.0)

## 2019-11-14 MED ORDER — METHOCARBAMOL 500 MG PO TABS: 500.0000 mg | ORAL_TABLET | Freq: Three times a day (TID) | ORAL | 2 refills | Status: DC | PRN

## 2019-11-14 MED ORDER — HYDROCODONE-ACETAMINOPHEN 7.5-325 MG PO TABS: 1.0000 | ORAL_TABLET | Freq: Three times a day (TID) | ORAL | 0 refills | Status: DC | PRN

## 2019-11-14 MED ORDER — RIVAROXABAN 20 MG PO TABS: 20.0000 mg | ORAL_TABLET | Freq: Every day | ORAL | 2 refills | Status: DC

## 2019-11-14 NOTE — Progress Notes (Signed)
Chief Complaint  Patient presents with  . Annual Exam    Well Female [name] is here for a complete physical.   Her last physical was >1 year ago.  Current diet: in general, a "healthy" diet.  Current exercise: walking, gardening Weight trend: stable Does pt snore? No. Daytime fatigue? No. Seat belt? Yes.    Health maintenance Shingrix- No Colonoscopy- No Tetanus- Yes HIV- No Hep C- No Mammogram- No  Patient has a history of chronic neck pain and neurosurgery on the cervical spine with Dr. Ellene Route.  Over the past several months, she has been having worsening pain and weakness in both upper extremities, worse than the left.  She saw the pain clinic and did not like them.  She would like to see Dr. Ellene Route before seeing another pain specialist.  Denies recent injury or change in activity.  She tries to stay active.   Past Medical History:  Diagnosis Date  . Back injury    Chronic  . Cardiomyopathy ([place])    hypertension controlled-helped with edema-Dr Nahser  . Deafness    Left ear  . Degenerative joint disease    with neck surgery  . Depression   . Diastolic dysfunction    with possible mild LVOT gradient  . Family history of adverse reaction to anesthesia    " my daughter has PONV and gets very grumpy"  . Fibromyalgia   . Former cigarette smoker   . Gestational diabetes   . H/O blood clots    due to side effect of medication  . Headache(784.0)    migraines  . Hypertension   . Leg cramps   . Neuropathy   . Pain    chronic   . PTSD (post-traumatic stress disorder)   . Respiratory disorder       Past Surgical History:  Procedure Laterality Date  . ABDOMINAL HYSTERECTOMY     partial  . CARDIAC CATHETERIZATION     Ejection Fraction 65-70%  . CERVICAL DISC ARTHROPLASTY N/A 02/21/2016   Procedure: Cervical three-four artificial disc replacement;  Surgeon: Kristeen Miss, MD;  Location: [facility];  Service: Neurosurgery;  Laterality: N/A;  . CERVICAL  FUSION     C5-6  . EXTERNAL EAR SURGERY     tumor at age 47, 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  . HERNIA REPAIR     " as a baby"  . NECK SURGERY    . OTHER SURGICAL HISTORY     History of cervical and lumbar disk surgery  . polypectomies     tubes  . POSTERIOR CERVICAL LAMINECTOMY WITH MET- RX Left 06/13/2018   Procedure: Left Cervical three-four Laminotomy/foraminotomy;  Surgeon: Kristeen Miss, MD;  Location: [facility];  Service: Neurosurgery;  Laterality: Left;  . TONSILLECTOMY AND ADENOIDECTOMY      Medications  Current Outpatient Medications on File Prior to Visit  Medication Sig Dispense Refill  . acetaminophen (TYLENOL) 500 MG tablet Take 1,000 mg by mouth every 6 (six) hours as needed for mild pain or headache.     . bisacodyl (DULCOLAX) 5 MG EC tablet Take 5 mg by mouth 2 (two) times daily as needed for moderate constipation.     . busPIRone (BUSPAR) 15 MG tablet Take 1 tablet (15 mg total) by mouth 3 (three) times daily. 270 tablet 1  . citalopram (CELEXA) 20 MG tablet Take 1 tablet (20 mg total) by mouth daily. 90 tablet 1  . clobetasol ointment (TEMOVATE) 0.05 % APPLY TO AFFECTED AREA  TWICE A DAY 30 g 1  . diazepam (VALIUM) 10 MG tablet TAKE 1 TABLET (10 MG TOTAL) BY MOUTH EVERY 12 (TWELVE) HOURS AS NEEDED FOR ANXIETY. 60 tablet 2  . hydrochlorothiazide (HYDRODIURIL) 25 MG tablet TAKE 1 TABLET(25 MG) BY MOUTH DAILY 90 tablet 3  . hydrOXYzine (ATARAX/VISTARIL) 25 MG tablet TAKE 3 TO 4 TABLETS BY MOUTH EVERY 8 HOURS AS NEEDED FOR ANXIETY. MAX AMOUNT PER INSURANCE 360 tablet 2  . levothyroxine (SYNTHROID) 50 MCG tablet TAKE 1 TABLET BY MOUTH EVERY DAY 90 tablet 0  . loratadine (CLARITIN) 10 MG tablet Take 10 mg by mouth 2 (two) times daily as needed (for seasonal allergies).     . Magnesium 500 MG TABS Take 500 mg by mouth daily.    . meclizine (ANTIVERT) 25 MG tablet Take 1 tablet (25 mg total) by mouth 3 (three) times daily as needed for dizziness. 30 tablet 3  . metoprolol tartrate  (LOPRESSOR) 25 MG tablet Take 1 tablet (25 mg total) by mouth 2 (two) times daily. 180 tablet 3  . risperiDONE (RISPERDAL) 1 MG tablet Take one tablet daily. 90 tablet 1  . vitamin B-12 (CYANOCOBALAMIN) 500 MCG tablet Take 500 mcg by mouth daily.    Alveda Reasons 20 MG TABS tablet TAKE 1 TABLET (20 MG TOTAL) BY MOUTH DAILY WITH SUPPER. 90 tablet 0   Allergies Allergies  Allergen Reactions  . Morphine And Related Other (See Comments)    "Didn't agree with me/wasn't very effective.."  . Lyrica [Pregabalin] Rash  . Neurontin [Gabapentin] Other (See Comments)    Left patient sleepy all the time    Family History Family History  Problem Relation Age of Onset  . Coronary artery disease Father   . Parkinson's disease Mother   . Cancer Sister        breast  . Diabetes Sister   . Diabetes Brother     Review of Systems: Constitutional:  no fevers Eye:  no recent significant change in vision Ear/Nose/Mouth/Throat:  Ears: +HOH Nose/Mouth/Throat:  no complaints of nasal congestion, no sore throat Cardiovascular:  no chest pain, no palpitations Respiratory:  no cough and no shortness of breath Gastrointestinal:  no abdominal pain, no change in bowel habits GU:  Female: negative for dysuria, frequency, and incontinence and negative for prostate symptoms Musculoskeletal/Extremities: + Worsening neck pain; otherwise no pain, redness, or swelling of the joints Integumentary (Skin/Breast):  no abnormal skin lesions reported Neurologic: + Left upper extremity weakness Endocrine: No unexpected weight changes Hematologic/Lymphatic:  no abnormal bleeding  Exam BP 120/82 (BP Location: Left Arm, Patient Position: Sitting, Cuff Size: Normal)   Pulse 63   Temp (!) 96.4 F (35.8 C) (Temporal)   Ht 5' 5.5" (1.664 m)   Wt 160 lb (72.6 kg)   SpO2 97%   BMI 26.22 kg/m  General:  well developed, well nourished, in no apparent distress Skin:  no significant moles, warts, or growths Head:  no masses,  lesions, or tenderness Eyes:  pupils equal and round, sclera anicteric without injection Ears:  canals are patent bilaterally; the right TM is with an anterior tube, question if there is discharge or fungus near the opening; the left TM appears deformed and slightly bulging; I did not appreciate any purulence or erythema Nose:  nares patent, septum midline, mucosa normal Throat/Pharynx:  lips and gingiva without lesion; tongue and uvula midline; non-inflamed pharynx; no exudates or postnasal drainage Neck: neck supple without adenopathy, thyromegaly, or masses Cardiac: RRR, no  bruits, no LE edema Lungs:  clear to auscultation, breath sounds equal bilaterally, no respiratory distress Rectal: Deferred Musculoskeletal: + TTP over cervical paraspinal musculature bilaterally; symmetrical muscle groups noted without atrophy or deformity Neuro:  gait normal; deep tendon reflexes normal and symmetric; 4/5 strength of the left upper extremity compared to the right Psych: well oriented with normal range of affect and appropriate judgment/insight  Assessment and Plan  Well adult exam - Plan: Comprehensive metabolic panel, CBC, Lipid panel  Chronic neck pain - Plan: Ambulatory referral to Neurosurgery  Left arm weakness - Plan: Ambulatory referral to Neurosurgery  Screen for colon cancer - Plan: Ambulatory referral to Gastroenterology  Screening for HIV (human immunodeficiency virus) - Plan: HIV Antibody (routine testing w rflx)  Encounter for hepatitis C screening test for low risk patient - Plan: Hepatitis C antibody  Encounter for screening mammogram for malignant neoplasm of breast - Plan: MM DIGITAL SCREENING BILATERAL   Well 59 y.o. female. Counseled on diet and exercise. We need to follow-up on some health maintenance issues as above. I would like her to get set up with Dr. Ellene Route again given her weakness.  If not a surgical case, I will set her up with another pain specialist.  I will  bridge her with hydrocodone until she can see the specialist.  She knows I will not prescribe this long-term. Immunizations, labs, and further orders as above. Follow up pending above. The patient voiced understanding and agreement to the plan.  [place], DO 11/14/19 12:22 PM

## 2019-11-14 NOTE — Patient Instructions (Signed)
Give [place] 2-3 business days to get the results of your labs back.   I am not the chef in question for managing long term pain medication. Let's see if Dr E thinks this requires surgery. If not, we will get you in with another pain specialist.  If you do not hear anything about your referrals in the next 1-2 weeks, call our office and ask for an update.  The new Shingrix vaccine (for shingles) is a 2 shot series. It can make people feel low energy, achy and almost like they have the flu for 48 hours after injection. Please plan accordingly when deciding on when to get this shot. Call our office for a nurse visit appointment to get this. The second shot of the series is less severe regarding the side effects, but it still lasts 48 hours. This would need to be at least 2 weeks after your second covid vaccination.   Keep the diet clean and stay active.  Let us know if you need anything.

## 2019-11-17 LAB — IRON,TIBC AND FERRITIN PANEL
%SAT: 23 % (calc) (ref 16–45)
Ferritin: 10 ng/mL — ABNORMAL LOW (ref 16–232)
Iron: 94 ug/dL (ref 45–160)
TIBC: 413 mcg/dL (calc) (ref 250–450)

## 2019-11-17 LAB — HEPATITIS C ANTIBODY
Hepatitis C Ab: NONREACTIVE
SIGNAL TO CUT-OFF: 0.01 (ref <1.00)

## 2019-11-17 LAB — HIV ANTIBODY (ROUTINE TESTING W REFLEX): HIV 1&2 Ab, 4th Generation: NONREACTIVE

## 2019-11-29 ENCOUNTER — Telehealth: Payer: Self-pay | Admitting: Family Medicine

## 2019-11-29 ENCOUNTER — Encounter (HOSPITAL_BASED_OUTPATIENT_CLINIC_OR_DEPARTMENT_OTHER): Payer: Self-pay

## 2019-11-29 ENCOUNTER — Emergency Department (HOSPITAL_BASED_OUTPATIENT_CLINIC_OR_DEPARTMENT_OTHER)
Admission: EM | Admit: 2019-11-29 | Discharge: 2019-11-29 | Disposition: A | Discharge status: Home / Self Care | Payer: BLUE CROSS/BLUE SHIELD | Attending: Emergency Medicine | Admitting: Emergency Medicine

## 2019-11-29 ENCOUNTER — Other Ambulatory Visit: Payer: Self-pay

## 2019-11-29 ENCOUNTER — Emergency Department (HOSPITAL_BASED_OUTPATIENT_CLINIC_OR_DEPARTMENT_OTHER): Payer: BLUE CROSS/BLUE SHIELD

## 2019-11-29 DIAGNOSIS — Y9289 Other specified places as the place of occurrence of the external cause: Secondary | ICD-10-CM | POA: Insufficient documentation

## 2019-11-29 DIAGNOSIS — F17210 Nicotine dependence, cigarettes, uncomplicated: Secondary | ICD-10-CM | POA: Diagnosis not present

## 2019-11-29 DIAGNOSIS — Y999 Unspecified external cause status: Secondary | ICD-10-CM | POA: Insufficient documentation

## 2019-11-29 DIAGNOSIS — W540XXA Bitten by dog, initial encounter: Secondary | ICD-10-CM | POA: Diagnosis not present

## 2019-11-29 DIAGNOSIS — I10 Essential (primary) hypertension: Secondary | ICD-10-CM | POA: Diagnosis not present

## 2019-11-29 DIAGNOSIS — Z7901 Long term (current) use of anticoagulants: Secondary | ICD-10-CM | POA: Insufficient documentation

## 2019-11-29 DIAGNOSIS — S62635B Displaced fracture of distal phalanx of left ring finger, initial encounter for open fracture: Secondary | ICD-10-CM | POA: Diagnosis not present

## 2019-11-29 DIAGNOSIS — Y9389 Activity, other specified: Secondary | ICD-10-CM | POA: Insufficient documentation

## 2019-11-29 DIAGNOSIS — S62615A Displaced fracture of proximal phalanx of left ring finger, initial encounter for closed fracture: Secondary | ICD-10-CM | POA: Diagnosis not present

## 2019-11-29 DIAGNOSIS — Z23 Encounter for immunization: Secondary | ICD-10-CM | POA: Insufficient documentation

## 2019-11-29 DIAGNOSIS — S61412A Laceration without foreign body of left hand, initial encounter: Secondary | ICD-10-CM | POA: Diagnosis not present

## 2019-11-29 DIAGNOSIS — S62645B Nondisplaced fracture of proximal phalanx of left ring finger, initial encounter for open fracture: Secondary | ICD-10-CM | POA: Diagnosis not present

## 2019-11-29 DIAGNOSIS — Z79899 Other long term (current) drug therapy: Secondary | ICD-10-CM | POA: Insufficient documentation

## 2019-11-29 DIAGNOSIS — S6992XA Unspecified injury of left wrist, hand and finger(s), initial encounter: Secondary | ICD-10-CM | POA: Diagnosis not present

## 2019-11-29 MED ORDER — HYDROCODONE-ACETAMINOPHEN 5-325 MG PO TABS: 1.0000 | ORAL_TABLET | ORAL | 0 refills | Status: DC | PRN

## 2019-11-29 MED ORDER — HYDROCODONE-ACETAMINOPHEN 7.5-325 MG PO TABS: 1.0000 | ORAL_TABLET | Freq: Three times a day (TID) | ORAL | 0 refills | Status: DC | PRN

## 2019-11-29 MED ORDER — DIAZEPAM 5 MG PO TABS
10.0000 mg | ORAL_TABLET | Freq: Once | ORAL | Status: AC
  Administered 2019-11-29: 10 mg via ORAL
  Filled 2019-11-29: qty 2

## 2019-11-29 MED ORDER — MORPHINE SULFATE (PF) 4 MG/ML IV SOLN
4.0000 mg | Freq: Once | INTRAVENOUS | Status: AC
  Administered 2019-11-29: 4 mg via INTRAVENOUS
  Filled 2019-11-29: qty 1

## 2019-11-29 MED ORDER — HYDROMORPHONE HCL 1 MG/ML IJ SOLN
1.0000 mg | Freq: Once | INTRAMUSCULAR | Status: AC
  Administered 2019-11-29: 1 mg via INTRAVENOUS

## 2019-11-29 MED ORDER — LIDOCAINE HCL (PF) 1 % IJ SOLN
30.0000 mL | Freq: Once | INTRAMUSCULAR | Status: DC
  Filled 2019-11-29: qty 30

## 2019-11-29 MED ORDER — AMOXICILLIN-POT CLAVULANATE 875-125 MG PO TABS: 1.0000 | ORAL_TABLET | Freq: Two times a day (BID) | ORAL | 0 refills | Status: DC

## 2019-11-29 MED ORDER — ONDANSETRON 4 MG PO TBDP
4.0000 mg | ORAL_TABLET | Freq: Once | ORAL | Status: AC
  Administered 2019-11-29: 4 mg via ORAL
  Filled 2019-11-29: qty 1

## 2019-11-29 MED ORDER — FENTANYL 50 MCG/HR TD PT72: 1.0000 | MEDICATED_PATCH | TRANSDERMAL | 0 refills | Status: DC

## 2019-11-29 MED ORDER — TETANUS-DIPHTH-ACELL PERTUSSIS 5-2.5-18.5 LF-MCG/0.5 IM SUSP
0.5000 mL | Freq: Once | INTRAMUSCULAR | Status: AC
  Administered 2019-11-29: 0.5 mL via INTRAMUSCULAR
  Filled 2019-11-29: qty 0.5

## 2019-11-29 MED ORDER — HYDROMORPHONE HCL 1 MG/ML IJ SOLN
INTRAMUSCULAR | Status: AC
  Filled 2019-11-29: qty 1

## 2019-11-29 MED ORDER — CEFAZOLIN SODIUM-DEXTROSE 1-4 GM/50ML-% IV SOLN
1.0000 g | Freq: Once | INTRAVENOUS | Status: AC
  Administered 2019-11-29: 1 g via INTRAVENOUS
  Filled 2019-11-29: qty 50

## 2019-11-29 NOTE — ED Notes (Signed)
Xeroform placed over sutures and wound. kling roll for splint under palm and dressed with kerlex,

## 2019-11-29 NOTE — Discharge Instructions (Addendum)
You need to call the plastic surgery office tomorrow to be seen tomorrow. Dr. Arita Miss know you will be calling.  Take the antibiotics as prescribed.   I have also written for pain medicine.  Keep the wound dry and do not wet to remove the splint until you are seen by Plastic surgery.

## 2019-11-29 NOTE — ED Triage Notes (Addendum)
Pt states 2 of her dogs were fighting ~10min PTA-she was bit left hand and right LE-rabies unknown-NAD-to triage in w/c

## 2019-11-29 NOTE — Telephone Encounter (Signed)
Last OV---11/14/2019 No upcoming scheduled appts. Last RF----11/14/2019 No CSC/UDS Last Refill 11/14/2019---#60 no refills

## 2019-11-29 NOTE — ED Notes (Signed)
Pt transported to xray 

## 2019-11-29 NOTE — ED Provider Notes (Signed)
[facility] Provider Note   CSN: [id number] Arrival date & time: 11/29/19  2057    History Chief Complaint  Patient presents with  . Animal Bite   [name] is a 59 y.o. female with past medical history significant for cardiomyopathy who presents for evaluation of wound.  Patient 2 dogs were fighting when she pulled them apart.  They bit her left hand and left calf.  She admits to leading to her left hand.  She was unable to remove her rings at home.  This occurred 45 minutes PTA.  Rates her pain a 10/10.  Rabies is unknown.  Unknown last tetanus.  Denies fever, chills, nausea, vomiting, paresthesias, drainage, swelling, redness.  Admits to decreased range of motion secondary to pain.  Denies additional aggravating or alleviating factors.  History obtained from patient and past medical record.  No interpreter used.  HPI     Past Medical History:  Diagnosis Date  . Back injury    Chronic  . Cardiomyopathy ([place])    hypertension controlled-helped with edema-Dr Nahser  . Deafness    Left ear  . Degenerative joint disease    with neck surgery  . Depression   . Diastolic dysfunction    with possible mild LVOT gradient  . Family history of adverse reaction to anesthesia    " my daughter has PONV and gets very grumpy"  . Fibromyalgia   . Former cigarette smoker   . Gestational diabetes   . H/O blood clots    due to side effect of medication  . Headache(784.0)    migraines  . Hypertension   . Leg cramps   . Neuropathy   . Pain    chronic   . PTSD (post-traumatic stress disorder)   . Respiratory disorder     Patient Active Problem List   Diagnosis Date Noted  . Chronic otitis externa of right ear 07/27/2019  . Eustachian tube dysfunction, right 07/27/2019  . Chronic mycotic otitis externa 07/13/2019  . Dizziness 10/13/2018  . History of mastoiditis 10/13/2018  . Vasculitis of skin 08/22/2018  . Cervical radiculopathy 06/13/2018   . GAD (generalized anxiety disorder) 03/09/2018  . Mixed conductive and sensorineural hearing loss of right ear with restricted hearing of left ear 09/16/2017  . Chronic mastoiditis of left side 05/27/2017  . Fibromyalgia 06/19/2016  . Spondylosis of cervical joint 02/21/2016  . Depression 04/09/2015  . Gastritis 04/09/2015  . Uncomplicated alcohol dependence ([place]) 04/08/2015  . HTN (hypertension) 08/27/2014  . Allodynia 05/17/2013  . Chest pain syndrome 04/28/2013  . Mild HOCM (hypertrophic obstructive cardiomyopathy) ([place]) 04/28/2013  . Palpitations 04/28/2013    Past Surgical History:  Procedure Laterality Date  . ABDOMINAL HYSTERECTOMY     partial  . CARDIAC CATHETERIZATION     Ejection Fraction 65-70%  . CERVICAL DISC ARTHROPLASTY N/A 02/21/2016   Procedure: Cervical three-four artificial disc replacement;  Surgeon: Kristeen Miss, MD;  Location: [facility];  Service: Neurosurgery;  Laterality: N/A;  . CERVICAL FUSION     C5-6  . EXTERNAL EAR SURGERY     tumor at age 42, 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  . HERNIA REPAIR     " as a baby"  . NECK SURGERY    . OTHER SURGICAL HISTORY     History of cervical and lumbar disk surgery  . polypectomies     tubes  . POSTERIOR CERVICAL LAMINECTOMY WITH MET- RX Left 06/13/2018   Procedure: Left Cervical three-four  Laminotomy/foraminotomy;  Surgeon: Kristeen Miss, MD;  Location: [facility];  Service: Neurosurgery;  Laterality: Left;  . TONSILLECTOMY AND ADENOIDECTOMY       OB History   No obstetric history on file.     Family History  Problem Relation Age of Onset  . Coronary artery disease Father   . Parkinson's disease Mother   . Cancer Sister        breast  . Diabetes Sister   . Diabetes Brother     Social History   Tobacco Use  . Smoking status: Current Some Day Smoker    Packs/day: 1.00    Years: 25.00    Pack years: 25.00    Types: Cigarettes  . Smokeless tobacco: Never Used  Substance Use Topics  . Alcohol use: Yes     Comment: very rare  . Drug use: No    Home Medications Prior to Admission medications   Medication Sig Start Date End Date Taking? Authorizing Provider  acetaminophen (TYLENOL) 500 MG tablet Take 1,000 mg by mouth every 6 (six) hours as needed for mild pain or headache.     [provider]  amoxicillin-clavulanate (AUGMENTIN) 875-125 MG tablet Take 1 tablet by mouth every 12 (twelve) hours. 11/29/19   ,  A, PA-C  bisacodyl (DULCOLAX) 5 MG EC tablet Take 5 mg by mouth 2 (two) times daily as needed for moderate constipation.     [provider]  busPIRone (BUSPAR) 15 MG tablet Take 1 tablet (15 mg total) by mouth 3 (three) times daily. 08/09/19   Mozingo, Berdie Ogren, NP  citalopram (CELEXA) 20 MG tablet Take 1 tablet (20 mg total) by mouth daily. 08/09/19   Mozingo, Berdie Ogren, NP  clobetasol ointment (TEMOVATE) 0.05 % APPLY TO AFFECTED AREA TWICE A DAY 05/10/19   Evans, Dorathy Daft, DPM  diazepam (VALIUM) 10 MG tablet TAKE 1 TABLET (10 MG TOTAL) BY MOUTH EVERY 12 (TWELVE) HOURS AS NEEDED FOR ANXIETY. 11/01/19   Mozingo, Berdie Ogren, NP  fentaNYL (DURAGESIC) 50 MCG/HR Place 1 patch onto the skin every 3 (three) days. 11/29/19   Shelda Pal, DO  hydrochlorothiazide (HYDRODIURIL) 25 MG tablet TAKE 1 TABLET(25 MG) BY MOUTH DAILY 05/29/19   Nahser, Wonda Cheng, MD  HYDROcodone-acetaminophen (NORCO/VICODIN) 5-325 MG tablet Take 1 tablet by mouth every 4 (four) hours as needed. 11/29/19   ,  A, PA-C  hydrOXYzine (ATARAX/VISTARIL) 25 MG tablet TAKE 3 TO 4 TABLETS BY MOUTH EVERY 8 HOURS AS NEEDED FOR ANXIETY. MAX AMOUNT PER INSURANCE 05/02/19   Shelda Pal, DO  levothyroxine (SYNTHROID) 50 MCG tablet TAKE 1 TABLET BY MOUTH EVERY DAY 02/14/19   Shelda Pal, DO  loratadine (CLARITIN) 10 MG tablet Take 10 mg by mouth 2 (two) times daily as needed (for seasonal allergies).     [provider]  Magnesium 500 MG TABS Take 500  mg by mouth daily.    [provider]  meclizine (ANTIVERT) 25 MG tablet Take 1 tablet (25 mg total) by mouth 3 (three) times daily as needed for dizziness. 10/07/18   Wendling, Crosby Oyster, DO  methocarbamol (ROBAXIN) 500 MG tablet Take 1 tablet (500 mg total) by mouth every 8 (eight) hours as needed for muscle spasms. 11/14/19   Shelda Pal, DO  metoprolol tartrate (LOPRESSOR) 25 MG tablet Take 1 tablet (25 mg total) by mouth 2 (two) times daily. 05/29/19   Nahser, Wonda Cheng, MD  risperiDONE (RISPERDAL) 1 MG tablet Take one tablet daily.  08/09/19   Mozingo, Berdie Ogren, NP  rivaroxaban (XARELTO) 20 MG TABS tablet Take 1 tablet (20 mg total) by mouth daily with supper. 11/14/19   Shelda Pal, DO  vitamin B-12 (CYANOCOBALAMIN) 500 MCG tablet Take 500 mcg by mouth daily.    [provider]    Allergies    Morphine and related, Lyrica [pregabalin], and Neurontin [gabapentin]  Review of Systems   Review of Systems  Constitutional: Negative.   HENT: Negative.   Respiratory: Negative.   Cardiovascular: Negative.   Genitourinary: Negative.   Musculoskeletal:       Left hand pain  Skin: Positive for wound.  Neurological: Negative.   All other systems reviewed and are negative.   Physical Exam Updated Vital Signs BP (!) 159/104 (BP Location: Right Arm)   Pulse (!) 110   Temp 99.1 F (37.3 C) (Oral)   Resp 16   Ht 5' 5.5" (1.664 m)   Wt 71.7 kg   SpO2 98%   BMI 25.89 kg/m   Physical Exam Vitals and nursing note reviewed.  Constitutional:      General: She is not in acute distress.    Appearance: She is well-developed. She is not ill-appearing, toxic-appearing or diaphoretic.  HENT:     Head: Normocephalic and atraumatic.     Nose: Nose normal.     Mouth/Throat:     Mouth: Mucous membranes are moist.     Pharynx: Oropharynx is clear.  Eyes:     Pupils: Pupils are equal, round, and reactive to light.  Cardiovascular:     Rate and  Rhythm: Normal rate.     Pulses: Normal pulses.     Heart sounds: Normal heart sounds.  Pulmonary:     Effort: Pulmonary effort is normal. No respiratory distress.     Breath sounds: Normal breath sounds.  Abdominal:     General: Bowel sounds are normal. There is no distension.  Musculoskeletal:        General: Normal range of motion.     Cervical back: Normal range of motion.     Comments: Compartments soft.  Injury to left hand and left calf.  There is some bleeding to left hand wound however no drainage.  No bony tenderness to left calf.  Patient with decreased range of motion to left palmar aspect at metacarpal due to pain. Able to flex and extend at digits.  Skin:    General: Skin is warm and dry.     Capillary Refill: Capillary refill takes less than 2 seconds.     Comments: Laceration left palmar aspect to digits 2, 3, 4.  No drainage however mild oozing of blood.  Neurological:     General: No focal deficit present.     Mental Status: She is alert.     Comments: Intact sensation to radial ulnar aspect hand and digits.         ED Results / Procedures / Treatments   Labs (all labs ordered are listed, but only abnormal results are displayed) Labs Reviewed - No data to display  EKG None  Radiology DG Hand Complete Left  Result Date: 11/29/2019 CLINICAL DATA:  59 year old female with dog bite to the left hand. EXAM: LEFT HAND - COMPLETE 3+ VIEW COMPARISON:  None. FINDINGS: There is a comminuted fracture of the base of the proximal phalanx of the fourth digit with mild dorsal angulation and a displaced fracture fragment. No other acute fracture identified. The bones are mildly osteopenic. No  arthritic changes. Small soft tissue puncture in the region of the fourth MCP joint. No radiopaque foreign object. IMPRESSION: Comminuted fracture of the base of the proximal phalanx of the fourth digit. Electronically Signed   By: Anner Crete M.D.   On: 11/29/2019 22:27     Procedures .Marland KitchenLaceration Repair  Date/Time: 11/29/2019 11:13 PM Performed by: Nettie Elm, PA-C Authorized by: Nettie Elm, PA-C   Consent:    Consent obtained:  Verbal   Consent given by:  Patient   Risks discussed:  Infection, need for additional repair, pain, poor cosmetic result and poor wound healing   Alternatives discussed:  No treatment and delayed treatment Universal protocol:    Procedure explained and questions answered to patient or proxy's satisfaction: yes     Relevant documents present and verified: yes     Test results available and properly labeled: yes     Imaging studies available: yes     Required blood products, implants, devices, and special equipment available: yes     Site/side marked: yes     Immediately prior to procedure, a time out was called: yes     Patient identity confirmed:  Verbally with patient Anesthesia (see MAR for exact dosages):    Anesthesia method:  Local infiltration   Local anesthetic:  Lidocaine 1% w/o epi Laceration details:    Location:  Hand   Hand location:  L palm   Length (cm):  4   Depth (mm):  4 Repair type:    Repair type:  Complex Pre-procedure details:    Preparation:  Patient was prepped and draped in usual sterile fashion and imaging obtained to evaluate for foreign bodies Exploration:    Hemostasis achieved with:  Direct pressure   Wound exploration: wound explored through full range of motion and entire depth of wound probed and visualized     Wound extent: underlying fracture     Wound extent: no muscle damage noted, no nerve damage noted, no tendon damage noted and no vascular damage noted     Contaminated: no   Treatment:    Area cleansed with:  Betadine   Amount of cleaning:  Extensive   Irrigation solution:  Sterile saline   Irrigation volume:  1L   Irrigation method:  Pressure wash   Debridement:  Moderate   Undermining:  None   Scar revision: no   Skin repair:    Repair method:  Sutures    Suture size:  5-0   Suture material:  Prolene   Suture technique:  Simple interrupted   Number of sutures:  8 Approximation:    Approximation:  Loose Post-procedure details:    Dressing:  Splint for protection   Patient tolerance of procedure:  Tolerated well, no immediate complications Reduction of fracture  Date/Time: 11/29/2019 11:14 PM Performed by: Nettie Elm, PA-C Authorized by: Nettie Elm, PA-C  Consent: Verbal consent obtained. Written consent not obtained. Risks and benefits: risks, benefits and alternatives were discussed Consent given by: patient Patient understanding: patient states understanding of the procedure being performed Patient consent: the patient's understanding of the procedure matches consent given Procedure consent: procedure consent matches procedure scheduled Relevant documents: relevant documents present and verified Test results: test results available and properly labeled Site marked: the operative site was marked Imaging studies: imaging studies available Required items: required blood products, implants, devices, and special equipment available Patient identity confirmed: verbally with patient Local anesthesia used: yes Anesthesia: digital block  Anesthesia: Local anesthesia  used: yes Local Anesthetic: lidocaine 1% without epinephrine  Sedation: Patient sedated: no  Patient tolerance: patient tolerated the procedure well with no immediate complications  .Ortho Injury Treatment  Date/Time: 11/29/2019 11:14 PM Performed by: Shelby Dubin A, PA-C Authorized by: Nettie Elm, PA-C   Consent:    Consent obtained:  Verbal   Consent given by:  Patient   Risks discussed:  Nerve damage, restricted joint movement, vascular damage, recurrent dislocation, irreducible dislocation and fracture   Alternatives discussed:  No treatmentInjury location: finger Location details: left ring finger Injury type: fracture Fracture type:  proximal phalanx MCP joint involved: yes IP joint involved: no Pre-procedure neurovascular assessment: neurovascularly intact Pre-procedure distal perfusion: normal Pre-procedure neurological function: normal Pre-procedure range of motion: normal  Anesthesia: Local anesthesia used: no  Patient sedated: NoManipulation performed: no Immobilization: splint Splint type: volar short arm Supplies used: aluminum splint Post-procedure neurovascular assessment: post-procedure neurovascularly intact Post-procedure distal perfusion: normal Post-procedure neurological function: normal Post-procedure range of motion: normal Patient tolerance: patient tolerated the procedure well with no immediate complications    (including critical care time)  Medications Ordered in ED Medications  lidocaine (PF) (XYLOCAINE) 1 % injection 30 mL (has no administration in time range)  HYDROmorphone (DILAUDID) 1 MG/ML injection (has no administration in time range)  morphine 4 MG/ML injection 4 mg (4 mg Intravenous Given 11/29/19 2130)  diazepam (VALIUM) tablet 10 mg (10 mg Oral Given 11/29/19 2130)  ondansetron (ZOFRAN-ODT) disintegrating tablet 4 mg (4 mg Oral Given 11/29/19 2130)  Tdap (BOOSTRIX) injection 0.5 mL (0.5 mLs Intramuscular Given 11/29/19 2222)  ceFAZolin (ANCEF) IVPB 1 g/50 mL premix ( Intravenous Stopped 11/29/19 2251)  HYDROmorphone (DILAUDID) injection 1 mg (1 mg Intravenous Given 11/29/19 2245)   ED Course  I have reviewed the triage vital signs and the nursing notes.  Pertinent labs & imaging results that were available during my care of the patient were reviewed by me and considered in my medical decision making (see chart for details).  59 year old female presents for evaluation of dog wound.  She has puncture wounds to left calf which do not appear actively infected.  No bony tenderness to LE. Decreased range of motion secondary to pain to left hand and digits however able to flex and extend.   Mild oozing of blood however do not appear actively infected.  Intact sensation to radial and ulnar aspect to left digits. Will obtain imaging.  Unknown last tetanus, will update.  Unsure of rabies for the dogs however they were not having any symptoms. Patient will monitor animals for symptoms of rabies does not want rabies series here in ED.   Imaging personally interpreted  Clinical Course as of Nov 29 2315  Wed Nov 29, 2019  2230 IMPRESSION: Comminuted fracture of the base of the proximal phalanx of the fourth digit.  DG Hand Complete Left [BH]    Clinical Course User Index [BH] ,  A, PA-C   Patient given Ancef for open fracture.  Pain managed on reassessment   CONSULT with Dr. Claudia Desanctis with hand surgery who recommends suture in ED, follow-up in office  tomorrow for possible surgery. Will be given Augmentin and pain management for outpatient.  Low suspicion for acute tendon or ligament injury.  Patient with #8 Prolene sutures to palmar aspect at base of digits 2 through 4.  Patient neurovascularly intact after splint placement.  Stable for DC home.  The patient has been appropriately medically screened and/or stabilized in the ED. I have  low suspicion for any other emergent medical condition which would require further screening, evaluation or treatment in the ED or require inpatient management.  Patient is hemodynamically stable and in no acute distress.  Patient able to ambulate in department prior to ED.  Evaluation does not show acute pathology that would require ongoing or additional emergent interventions while in the emergency department or further inpatient treatment.  I have discussed the diagnosis with the patient and answered all questions.  Pain is been managed while in the emergency department and patient has no further complaints prior to discharge.  Patient is comfortable with plan discussed in room and is stable for discharge at this time.  I have discussed  strict return precautions for returning to the emergency department.  Patient was encouraged to follow-up with PCP/specialist refer to at discharge. MDM Rules/Calculators/A&P                       Final Clinical Impression(s) / ED Diagnoses Final diagnoses:  Dog bite, initial encounter  Laceration of left hand without foreign body, initial encounter  Open displaced fracture of distal phalanx of left ring finger, initial encounter    Rx / DC Orders ED Discharge Orders         Ordered    amoxicillin-clavulanate (AUGMENTIN) 875-125 MG tablet  Every 12 hours     11/29/19 2310    HYDROcodone-acetaminophen (NORCO/VICODIN) 5-325 MG tablet  Every 4 hours PRN     11/29/19 2310           ,  A, PA-C 11/29/19 2317    Veryl Speak, MD 11/30/19 2301

## 2019-11-29 NOTE — ED Notes (Signed)
3 Rings cut off pt's left ring finger by Boneta Lucks, RT and placed in in labeled specimen cup.  Rings returned to pt.

## 2019-11-29 NOTE — Telephone Encounter (Signed)
Medication:HYDROcodone-acetaminophen (NORCO) 7.5-325 MG tablet   Pt not able to get in with surgeon until 12/15/19 she's in a lot of pain and needs meds to hold her over     Has the patient contacted their pharmacy? No. (If no, request that the patient contact the pharmacy for the refill.) (If yes, when and what did the pharmacy advise?)  Preferred Pharmacy (with phone number or street name):   [id number] [place] [place], [place] - [address]  [address] [place] [address]  Phone:  [phone] Fax:  [phone]  Agent: Please be advised that RX refills may take up to 3 business days. We ask that you follow-up with your pharmacy.

## 2019-11-29 NOTE — Telephone Encounter (Signed)
Patient can not get this filled, its too soon   HYDROcodone-acetaminophen (NORCO) 7.5-325 MG tablet [[id number]]    Please advise if there is any thing else that can help with pain

## 2019-11-29 NOTE — Telephone Encounter (Signed)
Too soon to fill can offer something else at this time

## 2019-11-30 ENCOUNTER — Encounter (HOSPITAL_COMMUNITY): Payer: Self-pay | Admitting: Plastic Surgery

## 2019-11-30 ENCOUNTER — Ambulatory Visit (INDEPENDENT_AMBULATORY_CARE_PROVIDER_SITE_OTHER): Payer: BLUE CROSS/BLUE SHIELD | Admitting: Plastic Surgery

## 2019-11-30 ENCOUNTER — Other Ambulatory Visit: Payer: Self-pay

## 2019-11-30 ENCOUNTER — Telehealth: Payer: Self-pay

## 2019-11-30 ENCOUNTER — Encounter: Payer: Self-pay | Admitting: Plastic Surgery

## 2019-11-30 VITALS — BP 145/79 | HR 89 | Temp 97.5°F | Ht 65.0 in | Wt 160.0 lb

## 2019-11-30 DIAGNOSIS — S62615A Displaced fracture of proximal phalanx of left ring finger, initial encounter for closed fracture: Secondary | ICD-10-CM

## 2019-11-30 MED ORDER — HYDROCODONE-ACETAMINOPHEN 5-325 MG PO TABS: 1.0000 | ORAL_TABLET | Freq: Four times a day (QID) | ORAL | 0 refills | Status: DC | PRN

## 2019-11-30 NOTE — Progress Notes (Signed)
Referring Provider Shelda Pal, DO [address] [place],  [place] [address]   CC:  Chief Complaint  Patient presents with  . Advice Only    for ED follow-up for broken fingers      [name] is an 59 y.o. female.  HPI: Patient presents as referral from the emergency room for dog bite that happened yesterday.  This was to her left hand.  She sustained lacerations to the volar aspect of her index long and ring fingers.  She also had a displaced proximal phalanx fracture of the ring finger.  She is evaluated the emergency room the wounds were washed out and closed and she was sent here for follow-up.  They did give her prophylactic antibiotics.  She is understandably in quite a bit of pain.  Allergies  Allergen Reactions  . Morphine And Related Other (See Comments)    "Didn't agree with me/wasn't very effective.."  . Lyrica [Pregabalin] Rash  . Neurontin [Gabapentin] Other (See Comments)    Left patient sleepy all the time    Outpatient Encounter Medications as of 11/30/2019  Medication Sig  . acetaminophen (TYLENOL) 500 MG tablet Take 1,000 mg by mouth every 6 (six) hours as needed for mild pain or headache.   Marland Kitchen amoxicillin-clavulanate (AUGMENTIN) 875-125 MG tablet Take 1 tablet by mouth every 12 (twelve) hours.  . bisacodyl (DULCOLAX) 5 MG EC tablet Take 5 mg by mouth 2 (two) times daily as needed for moderate constipation.   . busPIRone (BUSPAR) 15 MG tablet Take 1 tablet (15 mg total) by mouth 3 (three) times daily.  . citalopram (CELEXA) 20 MG tablet Take 1 tablet (20 mg total) by mouth daily.  . clobetasol ointment (TEMOVATE) 0.05 % APPLY TO AFFECTED AREA TWICE A DAY  . diazepam (VALIUM) 10 MG tablet TAKE 1 TABLET (10 MG TOTAL) BY MOUTH EVERY 12 (TWELVE) HOURS AS NEEDED FOR ANXIETY.  . fentaNYL (DURAGESIC) 50 MCG/HR Place 1 patch onto the skin every 3 (three) days.  . hydrochlorothiazide (HYDRODIURIL) 25 MG tablet TAKE 1 TABLET(25 MG) BY MOUTH  DAILY  . hydrOXYzine (ATARAX/VISTARIL) 25 MG tablet TAKE 3 TO 4 TABLETS BY MOUTH EVERY 8 HOURS AS NEEDED FOR ANXIETY. MAX AMOUNT PER INSURANCE  . levothyroxine (SYNTHROID) 50 MCG tablet TAKE 1 TABLET BY MOUTH EVERY DAY  . loratadine (CLARITIN) 10 MG tablet Take 10 mg by mouth 2 (two) times daily as needed (for seasonal allergies).   . Magnesium 500 MG TABS Take 500 mg by mouth daily.  . meclizine (ANTIVERT) 25 MG tablet Take 1 tablet (25 mg total) by mouth 3 (three) times daily as needed for dizziness.  . methocarbamol (ROBAXIN) 500 MG tablet Take 1 tablet (500 mg total) by mouth every 8 (eight) hours as needed for muscle spasms.  . metoprolol tartrate (LOPRESSOR) 25 MG tablet Take 1 tablet (25 mg total) by mouth 2 (two) times daily.  . risperiDONE (RISPERDAL) 1 MG tablet Take one tablet daily.  . rivaroxaban (XARELTO) 20 MG TABS tablet Take 1 tablet (20 mg total) by mouth daily with supper.  . vitamin B-12 (CYANOCOBALAMIN) 500 MCG tablet Take 500 mcg by mouth daily.  Marland Kitchen HYDROcodone-acetaminophen (NORCO/VICODIN) 5-325 MG tablet Take 1 tablet by mouth every 4 (four) hours as needed. (Patient not taking: Reported on 11/30/2019)   No facility-administered encounter medications on file as of 11/30/2019.     Past Medical History:  Diagnosis Date  . Back injury  Chronic  . Cardiomyopathy ([place])    hypertension controlled-helped with edema-Dr Nahser  . Deafness    Left ear  . Degenerative joint disease    with neck surgery  . Depression   . Diastolic dysfunction    with possible mild LVOT gradient  . Family history of adverse reaction to anesthesia    " my daughter has PONV and gets very grumpy"  . Fibromyalgia   . Former cigarette smoker   . Gestational diabetes   . H/O blood clots    due to side effect of medication  . Headache(784.0)    migraines  . Hypertension   . Leg cramps   . Neuropathy   . Pain    chronic   . PTSD (post-traumatic stress disorder)   . Respiratory disorder      Past Surgical History:  Procedure Laterality Date  . ABDOMINAL HYSTERECTOMY     partial  . CARDIAC CATHETERIZATION     Ejection Fraction 65-70%  . CERVICAL DISC ARTHROPLASTY N/A 02/21/2016   Procedure: Cervical three-four artificial disc replacement;  Surgeon: Kristeen Miss, MD;  Location: [facility];  Service: Neurosurgery;  Laterality: N/A;  . CERVICAL FUSION     C5-6  . 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 HERNIA REPAIR     " as a baby"  . NECK SURGERY    . OTHER SURGICAL HISTORY     History of cervical and lumbar disk surgery  . polypectomies     tubes  . POSTERIOR CERVICAL LAMINECTOMY WITH MET- RX Left 06/13/2018   Procedure: Left Cervical three-four Laminotomy/foraminotomy;  Surgeon: Kristeen Miss, MD;  Location: [facility];  Service: Neurosurgery;  Laterality: Left;  . TONSILLECTOMY AND ADENOIDECTOMY      Family History  Problem Relation Age of Onset  . Coronary artery disease Father   . Parkinson's disease Mother   . Cancer Sister        breast  . Diabetes Sister   . 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   Patient has two adult children.   Patient is currently not working.   Patient has a high school education.   Patient is right-handed.   Patient drinks 1-2 cups of coffee per day.     Review of Systems General: Denies fevers, chills, weight loss CV: Denies chest pain, shortness of breath, palpitations  Physical Exam Vitals with BMI 11/30/2019 11/29/2019 11/14/2019  Height '5\' 5"'$  5' 5.5" 5' 5.5"  Weight 160 lbs 158 lbs 160 lbs  BMI 26.63 24.26 83.41  Systolic 962 229 798  Diastolic 79 921 82  Pulse 89 110 63  Some encounter information is confidential and restricted. Go to Review Flowsheets activity to see all data.    General:  No acute distress,  Alert and oriented, Non-Toxic, Normal speech and affect Left hand: Fingers are  well-perfused with normal cap refill palp radial pulse.  Sensation is intact throughout including the digital nerve distributions on all the affected fingers.  She appears to have some capability to extend and flex all fingers although this is limited due to pain and swelling.  She has lacerations at the MP joint level volarly for the index long and ring fingers.  There looks like a mild gross anatomic malalignment of the ring finger.  X-ray was evaluated showing a displaced  angulated fracture of the ring finger proximal phalanx.  Assessment/Plan Patient presents after dog bite to the left hand.  I recommended closed reduction percutaneous pinning of the ring finger proximal phalanx.  Should I not be able to obtain a good reduction closed then I would open it and perform an open reduction internal fixation likely with plates and screws.  She prefers pens as to why.  I discussed the risk that include bleeding, infection, demonstrating structures, need for additional procedures.  I discussed bony complications including nonunion, malunion, and hardware complications.  She understands we will plan to get this done in the next 2 days.  Cindra Presume 11/30/2019, 11:58 AM

## 2019-11-30 NOTE — Progress Notes (Signed)
Spoke with pt for pre-op call. Pt has hx of HTN and blood clots. Pt is seen by Dr. Elease Hashimoto for HTN and Palpitations. Last office visit was 05/29/19. Pt is on Xarelto, states she was not instructed to stop it. I have messaged Dr. Arita Miss to find out if she needs to hold it this evening.   Pt is hard of hearing.  Pt will arrive early to get her Covid test done prior to surgery.

## 2019-11-30 NOTE — H&P (View-Only) (Signed)
Referring Provider Jeanette Pal, DO [address] [place],  [place] [address]   CC:  Chief Complaint  Patient presents with  . Advice Only    for ED follow-up for broken fingers      [name] is an 59 y.o. female.  HPI: Patient presents as referral from the emergency room for dog bite that happened yesterday.  This was to her left hand.  She sustained lacerations to the volar aspect of her index long and ring fingers.  She also had a displaced proximal phalanx fracture of the ring finger.  She is evaluated the emergency room the wounds were washed out and closed and she was sent here for follow-up.  They did give her prophylactic antibiotics.  She is understandably in quite a bit of pain.  Allergies  Allergen Reactions  . Morphine And Related Other (See Comments)    "Didn't agree with me/wasn't very effective.."  . Lyrica [Pregabalin] Rash  . Neurontin [Gabapentin] Other (See Comments)    Left patient sleepy all the time    Outpatient Encounter Medications as of 11/30/2019  Medication Sig  . acetaminophen (TYLENOL) 500 MG tablet Take 1,000 mg by mouth every 6 (six) hours as needed for mild pain or headache.   Marland Kitchen amoxicillin-clavulanate (AUGMENTIN) 875-125 MG tablet Take 1 tablet by mouth every 12 (twelve) hours.  . bisacodyl (DULCOLAX) 5 MG EC tablet Take 5 mg by mouth 2 (two) times daily as needed for moderate constipation.   . busPIRone (BUSPAR) 15 MG tablet Take 1 tablet (15 mg total) by mouth 3 (three) times daily.  . citalopram (CELEXA) 20 MG tablet Take 1 tablet (20 mg total) by mouth daily.  . clobetasol ointment (TEMOVATE) 0.05 % APPLY TO AFFECTED AREA TWICE A DAY  . diazepam (VALIUM) 10 MG tablet TAKE 1 TABLET (10 MG TOTAL) BY MOUTH EVERY 12 (TWELVE) HOURS AS NEEDED FOR ANXIETY.  . fentaNYL (DURAGESIC) 50 MCG/HR Place 1 patch onto the skin every 3 (three) days.  . hydrochlorothiazide (HYDRODIURIL) 25 MG tablet TAKE 1 TABLET(25 MG) BY MOUTH  DAILY  . hydrOXYzine (ATARAX/VISTARIL) 25 MG tablet TAKE 3 TO 4 TABLETS BY MOUTH EVERY 8 HOURS AS NEEDED FOR ANXIETY. MAX AMOUNT PER INSURANCE  . levothyroxine (SYNTHROID) 50 MCG tablet TAKE 1 TABLET BY MOUTH EVERY DAY  . loratadine (CLARITIN) 10 MG tablet Take 10 mg by mouth 2 (two) times daily as needed (for seasonal allergies).   . Magnesium 500 MG TABS Take 500 mg by mouth daily.  . meclizine (ANTIVERT) 25 MG tablet Take 1 tablet (25 mg total) by mouth 3 (three) times daily as needed for dizziness.  . methocarbamol (ROBAXIN) 500 MG tablet Take 1 tablet (500 mg total) by mouth every 8 (eight) hours as needed for muscle spasms.  . metoprolol tartrate (LOPRESSOR) 25 MG tablet Take 1 tablet (25 mg total) by mouth 2 (two) times daily.  . risperiDONE (RISPERDAL) 1 MG tablet Take one tablet daily.  . rivaroxaban (XARELTO) 20 MG TABS tablet Take 1 tablet (20 mg total) by mouth daily with supper.  . vitamin B-12 (CYANOCOBALAMIN) 500 MCG tablet Take 500 mcg by mouth daily.  Marland Kitchen HYDROcodone-acetaminophen (NORCO/VICODIN) 5-325 MG tablet Take 1 tablet by mouth every 4 (four) hours as needed. (Patient not taking: Reported on 11/30/2019)   No facility-administered encounter medications on file as of 11/30/2019.     Past Medical History:  Diagnosis Date  . Back injury  Chronic  . Cardiomyopathy ([place])    hypertension controlled-helped with edema-Dr Nahser  . Deafness    Left ear  . Degenerative joint disease    with neck surgery  . Depression   . Diastolic dysfunction    with possible mild LVOT gradient  . Family history of adverse reaction to anesthesia    " my daughter has PONV and gets very grumpy"  . Fibromyalgia   . Former cigarette smoker   . Gestational diabetes   . H/O blood clots    due to side effect of medication  . Headache(784.0)    migraines  . Hypertension   . Leg cramps   . Neuropathy   . Pain    chronic   . PTSD (post-traumatic stress disorder)   . Respiratory disorder      Past Surgical History:  Procedure Laterality Date  . ABDOMINAL HYSTERECTOMY     partial  . CARDIAC CATHETERIZATION     Ejection Fraction 65-70%  . CERVICAL DISC ARTHROPLASTY N/A 02/21/2016   Procedure: Cervical three-four artificial disc replacement;  Surgeon: Kristeen Miss, MD;  Location: [facility];  Service: Neurosurgery;  Laterality: N/A;  . CERVICAL FUSION     C5-6  . EXTERNAL EAR SURGERY     tumor at age 62, 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  . HERNIA REPAIR     " as a baby"  . NECK SURGERY    . OTHER SURGICAL HISTORY     History of cervical and lumbar disk surgery  . polypectomies     tubes  . POSTERIOR CERVICAL LAMINECTOMY WITH MET- RX Left 06/13/2018   Procedure: Left Cervical three-four Laminotomy/foraminotomy;  Surgeon: Kristeen Miss, MD;  Location: [facility];  Service: Neurosurgery;  Laterality: Left;  . TONSILLECTOMY AND ADENOIDECTOMY      Family History  Problem Relation Age of Onset  . Coronary artery disease Father   . Parkinson's disease Mother   . Cancer Sister        breast  . Diabetes Sister   . 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   Patient has two adult children.   Patient is currently not working.   Patient has a high school education.   Patient is right-handed.   Patient drinks 1-2 cups of coffee per day.     Review of Systems General: Denies fevers, chills, weight loss CV: Denies chest pain, shortness of breath, palpitations  Physical Exam Vitals with BMI 11/30/2019 11/29/2019 11/14/2019  Height '5\' 5"'$  5' 5.5" 5' 5.5"  Weight 160 lbs 158 lbs 160 lbs  BMI 26.63 72.09 47.09  Systolic 628 366 294  Diastolic 79 765 82  Pulse 89 110 63  Some encounter information is confidential and restricted. Go to Review Flowsheets activity to see all data.    General:  No acute distress,  Alert and oriented, Non-Toxic, Normal speech and affect Left hand: Fingers are  well-perfused with normal cap refill palp radial pulse.  Sensation is intact throughout including the digital nerve distributions on all the affected fingers.  She appears to have some capability to extend and flex all fingers although this is limited due to pain and swelling.  She has lacerations at the MP joint level volarly for the index long and ring fingers.  There looks like a mild gross anatomic malalignment of the ring finger.  X-ray was evaluated showing a displaced  angulated fracture of the ring finger proximal phalanx.  Assessment/Plan Patient presents after dog bite to the left hand.  I recommended closed reduction percutaneous pinning of the ring finger proximal phalanx.  Should I not be able to obtain a good reduction closed then I would open it and perform an open reduction internal fixation likely with plates and screws.  She prefers pens as to why.  I discussed the risk that include bleeding, infection, demonstrating structures, need for additional procedures.  I discussed bony complications including nonunion, malunion, and hardware complications.  She understands we will plan to get this done in the next 2 days.  Cindra Presume 11/30/2019, 11:58 AM

## 2019-11-30 NOTE — Telephone Encounter (Signed)
Find out what they are looking for please.

## 2019-11-30 NOTE — Telephone Encounter (Signed)
Morrie Sheldon, Pharmacist, [facility], calling regarding Fentanyl patch prescription.  Please call back [phone].

## 2019-11-30 NOTE — Telephone Encounter (Signed)
PA approved. Effective 11/30/2019 to 05/28/2020.

## 2019-11-30 NOTE — Telephone Encounter (Signed)
PA initiated via Covermymeds; KEY: [id number]. Awaiting determination.

## 2019-11-30 NOTE — Progress Notes (Signed)
Called Dr. Arita Miss to ask about pt needing to hold her Xarelto this evening. He stated yes, that pt does need to hold it. I called patient and her husband, [name] answered the phone and I told him to have her hold her Xarelto this evening. He voiced understanding.

## 2019-11-30 NOTE — Telephone Encounter (Signed)
The pharmacist said she doesn't qualify for fentanyl because she isn't on a high enough opoid mediaction tolerance such as morphine and etc and she also stated the 50 mg was too high for her to start off on.

## 2019-12-01 ENCOUNTER — Ambulatory Visit (HOSPITAL_COMMUNITY)
Admission: RE | Admit: 2019-12-01 | Discharge: 2019-12-01 | Disposition: A | Discharge status: Home / Self Care | Payer: BLUE CROSS/BLUE SHIELD | Source: Ambulatory Visit | Attending: Plastic Surgery | Admitting: Plastic Surgery

## 2019-12-01 ENCOUNTER — Other Ambulatory Visit: Payer: Self-pay

## 2019-12-01 ENCOUNTER — Encounter (HOSPITAL_COMMUNITY)
Admission: RE | Disposition: A | Discharge status: Home / Self Care | Payer: Self-pay | Source: Ambulatory Visit | Attending: Plastic Surgery

## 2019-12-01 ENCOUNTER — Ambulatory Visit (HOSPITAL_COMMUNITY): Payer: BLUE CROSS/BLUE SHIELD | Admitting: Anesthesiology

## 2019-12-01 ENCOUNTER — Ambulatory Visit (HOSPITAL_COMMUNITY): Payer: BLUE CROSS/BLUE SHIELD

## 2019-12-01 ENCOUNTER — Encounter (HOSPITAL_COMMUNITY): Payer: Self-pay | Admitting: Plastic Surgery

## 2019-12-01 DIAGNOSIS — Z20822 Contact with and (suspected) exposure to covid-19: Secondary | ICD-10-CM | POA: Diagnosis not present

## 2019-12-01 DIAGNOSIS — M5412 Radiculopathy, cervical region: Secondary | ICD-10-CM

## 2019-12-01 DIAGNOSIS — R079 Chest pain, unspecified: Secondary | ICD-10-CM

## 2019-12-01 DIAGNOSIS — S61213A Laceration without foreign body of left middle finger without damage to nail, initial encounter: Secondary | ICD-10-CM | POA: Insufficient documentation

## 2019-12-01 DIAGNOSIS — H90A31 Mixed conductive and sensorineural hearing loss, unilateral, right ear with restricted hearing on the contralateral side: Secondary | ICD-10-CM

## 2019-12-01 DIAGNOSIS — F418 Other specified anxiety disorders: Secondary | ICD-10-CM | POA: Insufficient documentation

## 2019-12-01 DIAGNOSIS — B369 Superficial mycosis, unspecified: Secondary | ICD-10-CM

## 2019-12-01 DIAGNOSIS — R42 Dizziness and giddiness: Secondary | ICD-10-CM

## 2019-12-01 DIAGNOSIS — Z885 Allergy status to narcotic agent status: Secondary | ICD-10-CM | POA: Diagnosis not present

## 2019-12-01 DIAGNOSIS — F411 Generalized anxiety disorder: Secondary | ICD-10-CM

## 2019-12-01 DIAGNOSIS — L959 Vasculitis limited to the skin, unspecified: Secondary | ICD-10-CM

## 2019-12-01 DIAGNOSIS — F17200 Nicotine dependence, unspecified, uncomplicated: Secondary | ICD-10-CM | POA: Insufficient documentation

## 2019-12-01 DIAGNOSIS — I421 Obstructive hypertrophic cardiomyopathy: Secondary | ICD-10-CM

## 2019-12-01 DIAGNOSIS — S61211A Laceration without foreign body of left index finger without damage to nail, initial encounter: Secondary | ICD-10-CM | POA: Insufficient documentation

## 2019-12-01 DIAGNOSIS — Z79899 Other long term (current) drug therapy: Secondary | ICD-10-CM | POA: Diagnosis not present

## 2019-12-01 DIAGNOSIS — R208 Other disturbances of skin sensation: Secondary | ICD-10-CM

## 2019-12-01 DIAGNOSIS — F4310 Post-traumatic stress disorder, unspecified: Secondary | ICD-10-CM | POA: Diagnosis not present

## 2019-12-01 DIAGNOSIS — F209 Schizophrenia, unspecified: Secondary | ICD-10-CM | POA: Insufficient documentation

## 2019-12-01 DIAGNOSIS — M47812 Spondylosis without myelopathy or radiculopathy, cervical region: Secondary | ICD-10-CM

## 2019-12-01 DIAGNOSIS — W540XXA Bitten by dog, initial encounter: Secondary | ICD-10-CM | POA: Diagnosis not present

## 2019-12-01 DIAGNOSIS — I10 Essential (primary) hypertension: Secondary | ICD-10-CM | POA: Insufficient documentation

## 2019-12-01 DIAGNOSIS — Z888 Allergy status to other drugs, medicaments and biological substances status: Secondary | ICD-10-CM | POA: Diagnosis not present

## 2019-12-01 DIAGNOSIS — S62615A Displaced fracture of proximal phalanx of left ring finger, initial encounter for closed fracture: Secondary | ICD-10-CM

## 2019-12-01 DIAGNOSIS — R002 Palpitations: Secondary | ICD-10-CM

## 2019-12-01 DIAGNOSIS — H6991 Unspecified Eustachian tube disorder, right ear: Secondary | ICD-10-CM

## 2019-12-01 DIAGNOSIS — Z79890 Hormone replacement therapy (postmenopausal): Secondary | ICD-10-CM | POA: Insufficient documentation

## 2019-12-01 DIAGNOSIS — F1020 Alcohol dependence, uncomplicated: Secondary | ICD-10-CM

## 2019-12-01 DIAGNOSIS — H6240 Otitis externa in other diseases classified elsewhere, unspecified ear: Secondary | ICD-10-CM

## 2019-12-01 DIAGNOSIS — Z8669 Personal history of other diseases of the nervous system and sense organs: Secondary | ICD-10-CM

## 2019-12-01 DIAGNOSIS — S62611A Displaced fracture of proximal phalanx of left index finger, initial encounter for closed fracture: Secondary | ICD-10-CM | POA: Diagnosis not present

## 2019-12-01 DIAGNOSIS — E039 Hypothyroidism, unspecified: Secondary | ICD-10-CM | POA: Diagnosis not present

## 2019-12-01 DIAGNOSIS — M797 Fibromyalgia: Secondary | ICD-10-CM

## 2019-12-01 DIAGNOSIS — H6981 Other specified disorders of Eustachian tube, right ear: Secondary | ICD-10-CM

## 2019-12-01 DIAGNOSIS — M1990 Unspecified osteoarthritis, unspecified site: Secondary | ICD-10-CM | POA: Insufficient documentation

## 2019-12-01 DIAGNOSIS — Z7901 Long term (current) use of anticoagulants: Secondary | ICD-10-CM | POA: Insufficient documentation

## 2019-12-01 DIAGNOSIS — H7012 Chronic mastoiditis, left ear: Secondary | ICD-10-CM

## 2019-12-01 HISTORY — PX: PERCUTANEOUS PINNING: SHX2209

## 2019-12-01 HISTORY — DX: Schizophrenia, unspecified: F20.9

## 2019-12-01 HISTORY — DX: Hypothyroidism, unspecified: E03.9

## 2019-12-01 HISTORY — DX: Bipolar disorder, unspecified: F31.9

## 2019-12-01 LAB — BASIC METABOLIC PANEL
Anion gap: 10 (ref 5–15)
BUN: 10 mg/dL (ref 6–20)
CO2: 25 mmol/L (ref 22–32)
Calcium: 8.7 mg/dL — ABNORMAL LOW (ref 8.9–10.3)
Chloride: 103 mmol/L (ref 98–111)
Creatinine, Ser: 0.74 mg/dL (ref 0.44–1.00)
GFR calc Af Amer: >60 mL/min (ref >60)
GFR calc non Af Amer: >60 mL/min (ref >60)
Glucose, Bld: 113 mg/dL — ABNORMAL HIGH (ref 70–99)
Potassium: 4.5 mmol/L (ref 3.5–5.1)
Sodium: 138 mmol/L (ref 135–145)

## 2019-12-01 LAB — PROTIME-INR
INR: 0.9 (ref 0.8–1.2)
Prothrombin Time: 12.3 seconds (ref 11.4–15.2)

## 2019-12-01 LAB — RESPIRATORY PANEL BY RT PCR (FLU A&B, COVID)
Influenza A by PCR: NEGATIVE
Influenza B by PCR: NEGATIVE
SARS Coronavirus 2 by RT PCR: NEGATIVE

## 2019-12-01 SURGERY — PINNING, EXTREMITY, PERCUTANEOUS
Anesthesia: Monitor Anesthesia Care | Site: Finger | Laterality: Left

## 2019-12-01 MED ORDER — PROPOFOL 500 MG/50ML IV EMUL
INTRAVENOUS | Status: DC | PRN
  Administered 2019-12-01: 100 ug/kg/min via INTRAVENOUS

## 2019-12-01 MED ORDER — 0.9 % SODIUM CHLORIDE (POUR BTL) OPTIME
TOPICAL | Status: DC | PRN
  Administered 2019-12-01: 1000 mL

## 2019-12-01 MED ORDER — ACETAMINOPHEN 500 MG PO TABS: 1000.0000 mg | ORAL_TABLET | Freq: Once | ORAL | Status: AC

## 2019-12-01 MED ORDER — MIDAZOLAM HCL 2 MG/2ML IJ SOLN
INTRAMUSCULAR | Status: AC
  Administered 2019-12-01: 1 mg via INTRAVENOUS
  Filled 2019-12-01: qty 2

## 2019-12-01 MED ORDER — ONDANSETRON HCL 4 MG/2ML IJ SOLN
INTRAMUSCULAR | Status: DC | PRN
  Administered 2019-12-01: 4 mg via INTRAVENOUS

## 2019-12-01 MED ORDER — MIDAZOLAM HCL 2 MG/2ML IJ SOLN
INTRAMUSCULAR | Status: AC
  Filled 2019-12-01: qty 2

## 2019-12-01 MED ORDER — ACETAMINOPHEN 500 MG PO TABS
ORAL_TABLET | ORAL | Status: AC
  Administered 2019-12-01: 1000 mg via ORAL
  Filled 2019-12-01: qty 2

## 2019-12-01 MED ORDER — FENTANYL CITRATE (PF) 250 MCG/5ML IJ SOLN
INTRAMUSCULAR | Status: AC
  Filled 2019-12-01: qty 5

## 2019-12-01 MED ORDER — FENTANYL CITRATE (PF) 250 MCG/5ML IJ SOLN
INTRAMUSCULAR | Status: DC | PRN
  Administered 2019-12-01: 50 ug via INTRAVENOUS

## 2019-12-01 MED ORDER — HYDROMORPHONE HCL 1 MG/ML IJ SOLN: 0.2500 mg | INTRAMUSCULAR | Status: DC | PRN

## 2019-12-01 MED ORDER — DEXAMETHASONE SODIUM PHOSPHATE 10 MG/ML IJ SOLN
INTRAMUSCULAR | Status: DC | PRN
  Administered 2019-12-01: 10 mg

## 2019-12-01 MED ORDER — OXYCODONE HCL 5 MG PO TABS
ORAL_TABLET | ORAL | Status: AC
  Filled 2019-12-01: qty 1

## 2019-12-01 MED ORDER — KETOROLAC TROMETHAMINE 30 MG/ML IJ SOLN
INTRAMUSCULAR | Status: AC
  Filled 2019-12-01: qty 1

## 2019-12-01 MED ORDER — ROPIVACAINE HCL 5 MG/ML IJ SOLN
INTRAMUSCULAR | Status: DC | PRN
  Administered 2019-12-01: 30 mL via PERINEURAL

## 2019-12-01 MED ORDER — FENTANYL CITRATE (PF) 100 MCG/2ML IJ SOLN: 50.0000 ug | Freq: Once | INTRAMUSCULAR | Status: AC

## 2019-12-01 MED ORDER — KETOROLAC TROMETHAMINE 30 MG/ML IJ SOLN
30.0000 mg | Freq: Once | INTRAMUSCULAR | Status: AC | PRN
  Administered 2019-12-01: 30 mg via INTRAVENOUS

## 2019-12-01 MED ORDER — PHENYLEPHRINE 40 MCG/ML (10ML) SYRINGE FOR IV PUSH (FOR BLOOD PRESSURE SUPPORT)
PREFILLED_SYRINGE | INTRAVENOUS | Status: AC
  Filled 2019-12-01: qty 10

## 2019-12-01 MED ORDER — CEFAZOLIN SODIUM-DEXTROSE 2-4 GM/100ML-% IV SOLN
2.0000 g | INTRAVENOUS | Status: AC
  Administered 2019-12-01: 2 g via INTRAVENOUS

## 2019-12-01 MED ORDER — BUPIVACAINE HCL (PF) 0.25 % IJ SOLN
INTRAMUSCULAR | Status: AC
  Filled 2019-12-01: qty 30

## 2019-12-01 MED ORDER — BUPIVACAINE HCL (PF) 0.25 % IJ SOLN
INTRAMUSCULAR | Status: DC | PRN
  Administered 2019-12-01: 30 mL

## 2019-12-01 MED ORDER — CEFAZOLIN SODIUM-DEXTROSE 2-4 GM/100ML-% IV SOLN
INTRAVENOUS | Status: AC
  Filled 2019-12-01: qty 100

## 2019-12-01 MED ORDER — OXYCODONE HCL 5 MG PO TABS
5.0000 mg | ORAL_TABLET | Freq: Once | ORAL | Status: AC | PRN
  Administered 2019-12-01: 5 mg via ORAL

## 2019-12-01 MED ORDER — OXYCODONE HCL 5 MG/5ML PO SOLN: 5.0000 mg | Freq: Once | ORAL | Status: AC | PRN

## 2019-12-01 MED ORDER — PHENYLEPHRINE HCL-NACL 10-0.9 MG/250ML-% IV SOLN
INTRAVENOUS | Status: DC | PRN
  Administered 2019-12-01: 25 ug/min via INTRAVENOUS

## 2019-12-01 MED ORDER — PROMETHAZINE HCL 25 MG/ML IJ SOLN: 6.2500 mg | INTRAMUSCULAR | Status: DC | PRN

## 2019-12-01 MED ORDER — LACTATED RINGERS IV SOLN: INTRAVENOUS | Status: DC

## 2019-12-01 MED ORDER — FENTANYL CITRATE (PF) 100 MCG/2ML IJ SOLN
INTRAMUSCULAR | Status: AC
  Administered 2019-12-01: 50 ug via INTRAVENOUS
  Filled 2019-12-01: qty 2

## 2019-12-01 MED ORDER — PROPOFOL 10 MG/ML IV BOLUS
INTRAVENOUS | Status: DC | PRN
  Administered 2019-12-01: 20 mg via INTRAVENOUS

## 2019-12-01 MED ORDER — MIDAZOLAM HCL 2 MG/2ML IJ SOLN
INTRAMUSCULAR | Status: DC | PRN
  Administered 2019-12-01: 1 mg via INTRAVENOUS

## 2019-12-01 MED ORDER — ONDANSETRON HCL 4 MG/2ML IJ SOLN
INTRAMUSCULAR | Status: AC
  Filled 2019-12-01: qty 2

## 2019-12-01 MED ORDER — SUCCINYLCHOLINE CHLORIDE 200 MG/10ML IV SOSY
PREFILLED_SYRINGE | INTRAVENOUS | Status: AC
  Filled 2019-12-01: qty 10

## 2019-12-01 MED ORDER — LIDOCAINE 2% (20 MG/ML) 5 ML SYRINGE
INTRAMUSCULAR | Status: DC | PRN
  Administered 2019-12-01: 80 mg via INTRAVENOUS

## 2019-12-01 MED ORDER — MIDAZOLAM HCL 2 MG/2ML IJ SOLN: 1.0000 mg | Freq: Once | INTRAMUSCULAR | Status: AC

## 2019-12-01 SURGICAL SUPPLY — 56 items
BLADE CLIPPER SURG (BLADE) IMPLANT
BNDG CMPR 9X4 STRL LF SNTH (GAUZE/BANDAGES/DRESSINGS) ×1
BNDG ELASTIC 3X5.8 VLCR STR LF (GAUZE/BANDAGES/DRESSINGS) ×3 IMPLANT
BNDG ELASTIC 4X5.8 VLCR STR LF (GAUZE/BANDAGES/DRESSINGS) ×3 IMPLANT
BNDG ESMARK 4X9 LF (GAUZE/BANDAGES/DRESSINGS) ×3 IMPLANT
BNDG GAUZE ELAST 4 BULKY (GAUZE/BANDAGES/DRESSINGS) ×5 IMPLANT
CAP PIN PROTECTOR ORTHO WHT (CAP) ×2 IMPLANT
CORD BIPOLAR FORCEPS 12FT (ELECTRODE) ×3 IMPLANT
COVER SURGICAL LIGHT HANDLE (MISCELLANEOUS) ×3 IMPLANT
COVER WAND RF STERILE (DRAPES) ×3 IMPLANT
CUFF TOURN SGL QUICK 18X4 (TOURNIQUET CUFF) ×3 IMPLANT
CUFF TOURN SGL QUICK 24 (TOURNIQUET CUFF)
CUFF TRNQT CYL 24X4X16.5-23 (TOURNIQUET CUFF) IMPLANT
DRAIN TLS ROUND 10FR (DRAIN) IMPLANT
DRAPE OEC MINIVIEW 54X84 (DRAPES) IMPLANT
DRAPE SURG 17X23 STRL (DRAPES) ×3 IMPLANT
DRSG EMULSION OIL 3X3 NADH (GAUZE/BANDAGES/DRESSINGS) IMPLANT
GAUZE SPONGE 4X4 12PLY STRL (GAUZE/BANDAGES/DRESSINGS) ×3 IMPLANT
GAUZE XEROFORM 1X8 LF (GAUZE/BANDAGES/DRESSINGS) ×3 IMPLANT
GLOVE BIOGEL M 8.0 STRL (GLOVE) ×3 IMPLANT
GLOVE SS BIOGEL STRL SZ 8 (GLOVE) ×1 IMPLANT
GLOVE SUPERSENSE BIOGEL SZ 8 (GLOVE) ×2
GOWN STRL REUS W/ TWL LRG LVL3 (GOWN DISPOSABLE) ×1 IMPLANT
GOWN STRL REUS W/ TWL XL LVL3 (GOWN DISPOSABLE) ×1 IMPLANT
GOWN STRL REUS W/TWL LRG LVL3 (GOWN DISPOSABLE) ×3
GOWN STRL REUS W/TWL XL LVL3 (GOWN DISPOSABLE) ×3
K-WIRE DBL TROCAR .035X4 (WIRE) ×3
KIT BASIN OR (CUSTOM PROCEDURE TRAY) ×3 IMPLANT
KIT TURNOVER KIT B (KITS) ×3 IMPLANT
KWIRE DBL TROCAR .035X4 (WIRE) IMPLANT
MANIFOLD NEPTUNE II (INSTRUMENTS) ×3 IMPLANT
NEEDLE 22X1 1/2 (OR ONLY) (NEEDLE) IMPLANT
NS IRRIG 1000ML POUR BTL (IV SOLUTION) ×3 IMPLANT
PACK ORTHO EXTREMITY (CUSTOM PROCEDURE TRAY) ×3 IMPLANT
PAD ARMBOARD 7.5X6 YLW CONV (MISCELLANEOUS) ×6 IMPLANT
PAD CAST 3X4 CTTN HI CHSV (CAST SUPPLIES) ×1 IMPLANT
PAD CAST 4YDX4 CTTN HI CHSV (CAST SUPPLIES) ×1 IMPLANT
PADDING CAST COTTON 3X4 STRL (CAST SUPPLIES) ×3
PADDING CAST COTTON 4X4 STRL (CAST SUPPLIES) ×3
SOL PREP POV-IOD 4OZ 10% (MISCELLANEOUS) ×6 IMPLANT
SPLINT FIBERGLASS 3X35 (CAST SUPPLIES) ×2 IMPLANT
SPONGE LAP 4X18 RFD (DISPOSABLE) IMPLANT
SUT CHROMIC 4 0 P 3 18 (SUTURE) ×2 IMPLANT
SUT CHROMIC 4 0 PS 2 18 (SUTURE) ×2 IMPLANT
SUT MNCRL AB 4-0 PS2 18 (SUTURE) ×3 IMPLANT
SUT PROLENE 3 0 PS 2 (SUTURE) IMPLANT
SUT VIC AB 3-0 FS2 27 (SUTURE) IMPLANT
SYR CONTROL 10ML LL (SYRINGE) IMPLANT
SYSTEM CHEST DRAIN TLS 7FR (DRAIN) IMPLANT
TOWEL GREEN STERILE (TOWEL DISPOSABLE) ×3 IMPLANT
TOWEL GREEN STERILE FF (TOWEL DISPOSABLE) ×3 IMPLANT
TUBE CONNECTING 12'X1/4 (SUCTIONS) ×1
TUBE CONNECTING 12X1/4 (SUCTIONS) ×2 IMPLANT
TUBE EVACUATION TLS (MISCELLANEOUS) ×3 IMPLANT
WATER STERILE IRR 1000ML POUR (IV SOLUTION) ×3 IMPLANT
YANKAUER SUCT BULB TIP NO VENT (SUCTIONS) ×2 IMPLANT

## 2019-12-01 NOTE — Brief Op Note (Signed)
12/01/2019  2:31 PM  PATIENT:  [name]  59 y.o. female  PRE-OPERATIVE DIAGNOSIS:  Displaced Fracture Of Proximal Phalanx Of Left Ring Finger, Initial Encounter For Closed Fracture  POST-OPERATIVE DIAGNOSIS:  Displaced Fracture Of Proximal Phalanx Of Left Ring Finger, Initial Encounter For Closed Fracture  PROCEDURE:  Procedure(s): Closed reduction percutaneous pinning left ring finger proximal phalanx fracture (Left)  SURGEON:  Surgeon(s) and Role:    * Glenyce Randle, Wendy Poet, MD - Primary  PHYSICIAN ASSISTANT: Joni Fears, PA  ASSISTANTS: none   ANESTHESIA:   regional  EBL:  5 mL   BLOOD ADMINISTERED:none  DRAINS: none   LOCAL MEDICATIONS USED:  NONE  SPECIMEN:  No Specimen  DISPOSITION OF SPECIMEN:  N/A  COUNTS:  YES  TOURNIQUET:  * Missing tourniquet times found for documented tourniquets in log: [id number] *  DICTATION: .Reubin Milan Dictation  PLAN OF CARE: Discharge to home after PACU  PATIENT DISPOSITION:  PACU - hemodynamically stable.   Delay start of Pharmacological VTE agent (>24hrs) due to surgical blood loss or risk of bleeding: not applicable

## 2019-12-01 NOTE — Op Note (Signed)
Operative Note   DATE OF OPERATION: 12/01/2019  SURGICAL DEPARTMENT: Plastic Surgery  PREOPERATIVE DIAGNOSES: Left ring finger proximal phalanx fracture  POSTOPERATIVE DIAGNOSES:  same  PROCEDURE: 1. Incision and drainage left hand 2. Closed reduction percutaneous pinning left ring finger proximal phalanx fracture  SURGEON: Ancil Linsey, MD  ASSISTANT: Joni Fears, PA The advanced practice practitioner (APP) assisted throughout the case.  The APP was essential in retraction and counter traction when needed to make the case progress smoothly.  This retraction and assistance made it possible to see the tissue plans for the procedure.  The assistance was needed for blood control, tissue re-approximation and assisted with closure of the incision site.  ANESTHESIA:  General.   COMPLICATIONS: None.   INDICATIONS FOR PROCEDURE:  The patient, [name] is a 59 y.o. female born on 1961-03-04, is here for treatment of dog bite to the left hand. She has displaced black fracture of the proximal phalanx of the ring finger and is here for reduction fixation. MRN: [id number]  CONSENT:  Informed consent was obtained directly from the patient. Risks, benefits and alternatives were fully discussed. Specific risks including but not limited to bleeding, infection, hematoma, seroma, scarring, pain, contracture, asymmetry, wound healing problems, and need for further surgery were all discussed. The patient did have an ample opportunity to have questions answered to satisfaction.   DESCRIPTION OF PROCEDURE:  The patient was taken to the operating room. SCDs were placed and Ancef antibiotics were given. Regional anesthesia was administered.  The patient's operative site was prepped and draped in a sterile fashion. A time out was performed and all information was confirmed to be correct. Started by examining the hand. She had a transverse laceration going across the volar flexion creases at the MP  joint level of the long and ring fingers. There was a little bit of murky fluid coming out of this so I removed the Prolene sutures that were placed in the emergency room and washed out the wound copiously. The deepest laceration was on the long finger and I was able to identify intact flexor tendons. I then loosely closed these lacerations with interrupted 4-0 chromic sutures.  I then turned my attention to the ring finger proximal phalanx. There is a displaced and rotated fracture of the transverse with the proximal aspect of the proximal phalanx shaft. I was able to reduce this by flexing the finger and then stabilized it with 2 .035 K wires. This obtained near anatomic alignment. The K wires were then bent and clipped cover with pin caps. Xeroform was placed over the volar incision and around the pin exit sites. A volar splint was then fashioned and secured with an Ace wrap.  The patient tolerated the procedure well.  There were no complications. The patient was allowed to wake from anesthesia, extubated and taken to the recovery room in satisfactory condition.

## 2019-12-01 NOTE — Transfer of Care (Signed)
Immediate Anesthesia Transfer of Care Note  Patient: [name]  Procedure(s) Performed: Closed reduction percutaneous pinning left ring finger proximal phalanx fracture (Left Finger)  Patient Location: PACU  Anesthesia Type:MAC  Level of Consciousness: awake, alert  and oriented  Airway & Oxygen Therapy: Patient Spontanous Breathing and Patient connected to face mask oxygen  Post-op Assessment: Report given to RN and Post -op Vital signs reviewed and stable  Post vital signs: Reviewed and stable  Last Vitals:  Vitals Value Taken Time  BP 103/83 12/01/19 1416  Temp    Pulse 52 12/01/19 1416  Resp 18 12/01/19 1416  SpO2 99 % 12/01/19 1416  Vitals shown include unvalidated device data.  Last Pain:  Vitals:   12/01/19 1020  TempSrc:   PainSc: 9          Complications: No apparent anesthesia complications

## 2019-12-01 NOTE — Interval H&P Note (Signed)
History and Physical Interval Note:  12/01/2019 1:05 PM  [name]  has presented today for surgery, with the diagnosis of Displaced Fracture Of Proximal Phalanx Of Left Ring Finger, Initial Encounter For Closed Fracture.  The various methods of treatment have been discussed with the patient and family. After consideration of risks, benefits and other options for treatment, the patient has consented to  Procedure(s): Closed reduction percutaneous pinning left ring finger proximal phalanx fracture (Left) Possible open reduction internal fixation (Left) as a surgical intervention.  The patient's history has been reviewed, patient examined, no change in status, stable for surgery.  I have reviewed the patient's chart and labs.  Questions were answered to the patient's satisfaction.     [name]

## 2019-12-01 NOTE — Anesthesia Procedure Notes (Signed)
Anesthesia Regional Block: Supraclavicular block   Pre-Anesthetic Checklist: ,, timeout performed, Correct Patient, Correct Site, Correct Laterality, Correct Procedure, Correct Position, site marked, Risks and benefits discussed,  Surgical consent,  Pre-op evaluation,  At surgeon's request and post-op pain management  Laterality: Left  Prep: Maximum Sterile Barrier Precautions used, chloraprep       Needles:  Injection technique: Single-shot  Needle Type: Echogenic Stimulator Needle     Needle Length: 9cm  Needle Gauge: 22     Additional Needles:   Procedures:,,,, ultrasound used (permanent image in chart),,,,  Narrative:  Start time: 12/01/2019 11:05 AM End time: 12/01/2019 11:10 AM Injection made incrementally with aspirations every 5 mL.  Performed by: Personally  Anesthesiologist: Lannie Fields, DO  Additional Notes: Monitors applied. No increased pain on injection. No increased resistance to injection. Injection made in 5cc increments. Good needle visualization. Patient tolerated procedure well.

## 2019-12-01 NOTE — Anesthesia Preprocedure Evaluation (Addendum)
Anesthesia Evaluation  Patient identified by MRN, date of birth, ID band Patient awake    Reviewed: Allergy & Precautions, NPO status , Patient's Chart, lab work & pertinent test results, reviewed documented beta blocker date and time   Airway Mallampati: II  TM Distance: >3 FB Neck ROM: Full    Dental no notable dental hx. (+) Caps, Loose, Poor Dentition, Missing, Dental Advisory Given,    Pulmonary Current Smoker,  Current smoker, 13 pack year history, currently 5 cigarettes/d   Pulmonary exam normal breath sounds clear to auscultation       Cardiovascular hypertension, Pt. on medications and Pt. on home beta blockers + DVT  Normal cardiovascular exam Rhythm:Regular Rate:Normal  Echo 04/2019: 1. Left ventricular ejection fraction, by visual estimation, is 60 to  65%. The left ventricle has normal function. Normal left ventricular size.  Left ventricular septal wall thickness was normal. Normal left ventricular  posterior wall thickness. There  is no left ventricular hypertrophy.  2. Global right ventricle has normal systolic function.The right  ventricular size is normal. No increase in right ventricular wall  thickness.  3. Left atrial size was normal.  4. Right atrial size was normal.  5. Presence of pericardial fat pad.  6. The mitral valve is normal in structure. No evidence of mitral valve  regurgitation. No evidence of mitral stenosis.  7. The tricuspid valve is normal in structure. Tricuspid valve  regurgitation was not visualized by color flow Doppler.  8. The aortic valve is normal in structure. Aortic valve regurgitation  was not visualized by color flow Doppler. Structurally normal aortic  valve, with no evidence of sclerosis or stenosis.  9. The pulmonic valve was normal in structure. Pulmonic valve  regurgitation is not visualized by color flow Doppler.  10. TR signal is inadequate for assessing  pulmonary artery systolic  pressure.    Hx DVTs, last one about a year ago- eliquis last dose 2d ago    Neuro/Psych  Headaches, PSYCHIATRIC DISORDERS Anxiety Depression Bipolar Disorder Schizophrenia    GI/Hepatic negative GI ROS, (+)     substance abuse  alcohol use,   Endo/Other  Hypothyroidism   Renal/GU negative Renal ROS  negative genitourinary   Musculoskeletal  (+) Arthritis , Osteoarthritis,  Fibromyalgia -, narcotic dependent  Abdominal Normal abdominal exam  (+)   Peds negative pediatric ROS (+)  Hematology negative hematology ROS (+)   Anesthesia Other Findings Takes lortab 3x/d for years   Reproductive/Obstetrics negative OB ROS                            Anesthesia Physical Anesthesia Plan  ASA: III  Anesthesia Plan: MAC and Regional   Post-op Pain Management:  Regional for Post-op pain   Induction:   PONV Risk Score and Plan: 1 and Propofol infusion, TIVA and Treatment may vary due to age or medical condition  Airway Management Planned: Nasal Cannula and Natural Airway  Additional Equipment: None  Intra-op Plan:   Post-operative Plan:   Informed Consent: I have reviewed the patients History and Physical, chart, labs and discussed the procedure including the risks, benefits and alternatives for the proposed anesthesia with the patient or authorized representative who has indicated his/her understanding and acceptance.       Plan Discussed with: CRNA  Anesthesia Plan Comments:         Anesthesia Quick Evaluation

## 2019-12-01 NOTE — Discharge Instructions (Signed)
No heavy activities. Elevate arm to reduce swelling.  Diet: Regular  Wound Care: Keep dressing clean & dry.  Do not get splint wet.  You can shower but make sure to place a bag over the splint with a good seal to keep it dry.  Do not remove dressing.  Call doctor if any unusual problems occur such as pain, excessive bleeding, unrelieved nausea/vomiting, fever &/or chills.  Follow-up appointment: Should be seen by hand therapist for splint change within two weeks followed by appointment with Dr. Christion Roberts. 

## 2019-12-01 NOTE — Anesthesia Postprocedure Evaluation (Signed)
Anesthesia Post Note  Patient: [name]  Procedure(s) Performed: Closed reduction percutaneous pinning left ring finger proximal phalanx fracture (Left Finger)     Patient location during evaluation: PACU Anesthesia Type: Regional and MAC Level of consciousness: awake and alert Pain management: pain level controlled Vital Signs Assessment: post-procedure vital signs reviewed and stable Respiratory status: spontaneous breathing, nonlabored ventilation and respiratory function stable Cardiovascular status: blood pressure returned to baseline and stable Postop Assessment: no apparent nausea or vomiting Anesthetic complications: no    Last Vitals:  Vitals:   12/01/19 1415 12/01/19 1430  BP: 103/83 128/76  Pulse: (!) 59 62  Resp: 19 15  Temp: 36.6 C   SpO2: 99% 95%    Last Pain:  Vitals:   12/01/19 1415  TempSrc:   PainSc: 0-No pain                 Jarome Matin Linder Prajapati

## 2019-12-01 NOTE — Telephone Encounter (Signed)
Will dc

## 2019-12-04 ENCOUNTER — Telehealth: Payer: Self-pay | Admitting: Plastic Surgery

## 2019-12-04 ENCOUNTER — Telehealth: Payer: Self-pay | Admitting: Adult Health

## 2019-12-04 NOTE — Telephone Encounter (Signed)
She picked it up on 4/10 and is out?

## 2019-12-04 NOTE — Telephone Encounter (Signed)
Can we get more specifics?

## 2019-12-04 NOTE — Telephone Encounter (Signed)
Pt left message stating she was in between a dog fight  And had to have emergency surgery. She has to put her dogs down. Keeps having loud out burst and needs meds. Return call [phone]. Had to reschedule appt for 4/13.

## 2019-12-04 NOTE — Telephone Encounter (Signed)
Received voicemail from patient's spouse - left on Friday afternoon (12/01/2019 around 3:40 pm) regarding pain medication issues. Voicemail advised that an attempt would be made to contact the main office number to reach Dr. Arita Miss as well.   I contacted Dr. Arita Miss on 4/12, after listening to the message, to clarify the status of the message. Dr. Arita Miss advised that he is unable to send any additional pain medications in for the patient, and the pharmacy will only be able to fill as allowed since she is on a pain contract and also receiving pain medications from another provider.   I called the patient back to ask if she had already spoken with someone regarding this, and she indicated that she was told that the pain medications were sent to her pharmacy. However, the pharmacy would not fill her pain medications until 4/11 due to the frequency of the narcotics filled. Ms. Strauch indicated that her husband picked up the Hydrocodone 5/3.25 on 4/11 but she thought she was getting something stronger. She stated that she "is still in excruciating pain and felt like she needed to cut off her arm because the pain was so bad."  She stated that she would be willing to go back to the ED, but the pain was becoming unbearable instead of getting any better. She expressed her emotional status was also suffering because of the situation involving her dogs. I advised that I would let Dr. Arita Miss know about our discussion and make sure that he is aware of the concerns she has expressed.   This information was relayed to Dr. Arita Miss. Dr. Arita Miss confirmed the status of the narcotics have been exhausted for now, as relayed by the pharmacy. If the pain worsens, the patient may need to go to the ED for evaluation.   I relayed the information discussed above with the patient and she expressed understanding and agreement. She will continue to take what has been prescribed and if pain worsens, she will go to the emergency department.

## 2019-12-05 ENCOUNTER — Ambulatory Visit: Payer: BLUE CROSS/BLUE SHIELD | Admitting: Adult Health

## 2019-12-05 ENCOUNTER — Other Ambulatory Visit: Payer: Self-pay | Admitting: Adult Health

## 2019-12-05 ENCOUNTER — Telehealth: Payer: Self-pay | Admitting: Adult Health

## 2019-12-05 NOTE — Telephone Encounter (Signed)
Pt is wanting to know if she can get Xanax or something different. She still has her rx for Valium, but it is not helping with the anxiety.

## 2019-12-05 NOTE — Telephone Encounter (Signed)
She can't take two benzodiazepines.

## 2019-12-05 NOTE — Telephone Encounter (Signed)
She just filled valium a few days ago.

## 2019-12-05 NOTE — Telephone Encounter (Signed)
Pt called to check status on last message

## 2019-12-06 NOTE — Telephone Encounter (Signed)
[name] spoke with patient yesterday

## 2019-12-13 ENCOUNTER — Other Ambulatory Visit: Payer: Self-pay

## 2019-12-13 ENCOUNTER — Encounter: Payer: Self-pay | Admitting: *Deleted

## 2019-12-13 ENCOUNTER — Ambulatory Visit: Payer: BLUE CROSS/BLUE SHIELD | Attending: Plastic Surgery | Admitting: *Deleted

## 2019-12-13 DIAGNOSIS — M79642 Pain in left hand: Secondary | ICD-10-CM | POA: Diagnosis not present

## 2019-12-13 DIAGNOSIS — R600 Localized edema: Secondary | ICD-10-CM

## 2019-12-13 DIAGNOSIS — M6281 Muscle weakness (generalized): Secondary | ICD-10-CM

## 2019-12-13 DIAGNOSIS — M25542 Pain in joints of left hand: Secondary | ICD-10-CM | POA: Diagnosis not present

## 2019-12-13 DIAGNOSIS — M25642 Stiffness of left hand, not elsewhere classified: Secondary | ICD-10-CM

## 2019-12-13 NOTE — Patient Instructions (Signed)
WEARING SCHEDULE:  Wear splint at ALL times except for hygiene/pin care.  PURPOSE:  To prevent movement and for protection until injury can heal  CARE OF SPLINT:  Keep splint away from heat sources including: stove, radiator or furnace, or a car in sunlight. The splint can melt and will no longer fit you properly  Keep away from pets and children  Clean the splint with rubbing alcohol 1-2 times per day.  * During this time, make sure you also clean your hand/arm as instructed by your therapist and/or perform dressing changes as needed. Then dry hand/arm completely before replacing splint. (When cleaning hand/arm, keep it immobilized in same position until splint is replaced)  PRECAUTIONS/POTENTIAL PROBLEMS: *If you notice or experience increased pain, swelling, numbness, or a lingering reddened area from the splint: Contact your therapist immediately by calling [phone]. You must wear the splint for protection, but we will get you scheduled for adjustments as quickly as possible.  (If only straps or hooks need to be replaced and NO adjustments to the splint need to be made, just call the office ahead and let them know you are coming in)  If you have any medical concerns or signs of infection, please call your doctor immediately  You may bend the tips of your ring and small fingers but do not use your left hand for any functional activity. No lifting, pushing, pulling or carrying items with this hand.

## 2019-12-13 NOTE — Therapy (Signed)
[facility] [address] [place], [place], [address] Phone: [phone]   Fax:  [phone]  Occupational Therapy Evaluation  Patient Details  Name: [name] MRN: [id number] Date of Birth: Mar 23, 1961 Referring Provider (OT): Dr Claudia Desanctis   Encounter Date: 12/13/2019  OT End of Session - 12/13/19 1137    Visit Number  1    Number of Visits  14    Date for OT Re-Evaluation  01/24/20    Authorization Type  [employer]    OT Start Time  0800    OT Stop Time  0910    OT Time Calculation (min)  70 min    Activity Tolerance  Patient tolerated treatment well;No increased pain    Behavior During Therapy  WFL for tasks assessed/performed       Past Medical History:  Diagnosis Date  . Back injury    Chronic  . Bipolar disorder ([place])   . Cardiomyopathy ([place])    hypertension controlled-helped with edema-Dr Nahser  . Deafness    Left ear  . Degenerative joint disease    with neck surgery  . Depression   . Diastolic dysfunction    with possible mild LVOT gradient  . Family history of adverse reaction to anesthesia    " my daughter has PONV and gets very grumpy"  . Fibromyalgia   . Former cigarette smoker   . Gestational diabetes   . H/O blood clots    due to side effect of medication  . Headache(784.0)    migraines  . Hypertension   . Hypothyroidism   . Leg cramps   . Neuropathy   . Pain    chronic   . Pneumonia   . PTSD (post-traumatic stress disorder)   . Respiratory disorder   . Schizophrenia [facility])     Past Surgical History:  Procedure Laterality Date  . ABDOMINAL HYSTERECTOMY     partial  . CARDIAC CATHETERIZATION     Ejection Fraction 65-70%  . CERVICAL DISC ARTHROPLASTY N/A 02/21/2016   Procedure: Cervical three-four artificial disc replacement;  Surgeon: Kristeen Miss, MD;  Location: [facility];  Service: Neurosurgery;  Laterality: N/A;  . CERVICAL FUSION     C5-6  . EXTERNAL EAR SURGERY      tumor at age 66, 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  . HERNIA REPAIR     " as a baby"  . NECK SURGERY    . OTHER SURGICAL HISTORY     History of cervical and lumbar disk surgery  . PERCUTANEOUS PINNING Left 12/01/2019   Procedure: Closed reduction percutaneous pinning left ring finger proximal phalanx fracture;  Surgeon: Cindra Presume, MD;  Location: [facility];  Service: Plastics;  Laterality: Left;  . polypectomies     tubes  . POSTERIOR CERVICAL LAMINECTOMY WITH MET- RX Left 06/13/2018   Procedure: Left Cervical three-four Laminotomy/foraminotomy;  Surgeon: Kristeen Miss, MD;  Location: [facility];  Service: Neurosurgery;  Laterality: Left;  . TONSILLECTOMY AND ADENOIDECTOMY      There were no vitals filed for this visit.  Subjective Assessment - 12/13/19 0805    Subjective   Pt is a right hand dominant female whom sustained a dog bite to her left non-dominant hand on 11/29/19 and underwent pinning of her ring finger proximal phalanx per Dr Claudia Desanctis on 12/01/19. She is now 1 week and 5 days post op. She presents for volar splinting. She will return to Dr Claudia Desanctis on 12/14/19    Patient  is accompanied by:  Family member   Husband Sherron Monday   Pertinent History  Pt has significant PMH. Please refer to chart for complete details. Back injury (cervical), bipolar disorder, deafness, depression, DJD, fibromyalgia, HTN, hypothyroidism, neuropathy, leg cramps, schizophrenia, PTSD, respiratory disorder, cardiomyopathy.    Patient Stated Goals  Hoping to use fingers again on left hand.    Currently in Pain?  Yes    Pain Score  8     Pain Location  Finger (Comment which one)    Pain Orientation  Left    Pain Descriptors / Indicators  Burning;Throbbing;Tingling    Pain Type  Acute pain    Pain Onset  1 to 4 weeks ago    Pain Frequency  Intermittent    Aggravating Factors   If she forgets and attempts to hold or carry something, bangs her arm or hand.    Pain Relieving Factors  Pain medication helps to relieve pain.    Multiple  Pain Sites  No        [facility] OT Assessment - 12/13/19 0001      Assessment   Medical Diagnosis  Left Ring finger pinning of proximal phalanx & I&D following dog bite.    Referring Provider (OT)  Dr Claudia Desanctis    Onset Date/Surgical Date  --   DOI 11/29/19; DOS 12/01/2019   Hand Dominance  Right    Next MD Visit  12/14/2019      Precautions   Precautions  Other (comment)   Pin dorsal ring finger phalanx   Required Braces or Orthoses  Other Brace/Splint   Presents for protective splinting     Balance Screen   Has the patient fallen in the past 6 months  No    Has the patient had a decrease in activity level because of a fear of falling?   No    Is the patient reluctant to leave their home because of a fear of falling?   No      Home  Environment   Family/patient expects to be discharged to:  Private residence    Type of [place] 1 level, 2 STE     Prior Function   Level of Independence  Independent    Vocation  Unemployed    Leisure  [place] to be outside with animals, outdoor with plants etc.      ADL   ADL comments  Pt reports that her husband is assisting with ADL's PRN.      Mobility   Mobility Status  Independent      Written Expression   Dominant Hand  Right      Vision - History   Baseline Vision  Wears glasses all the time      Cognition   Overall Cognitive Status  Within Functional Limits for tasks assessed      Observation/Other Assessments   Observations  Some maceration was noted between long and ring fingers - a 2x2 was placed in the webspace to assist in decreasing maceration of this area. She is w/ minimal edema noted upon visual observation in the clinic. Pin site is dry and without drainage of any kind and volar/dorsal hand are without signs or symptoms of ipossible infection as observed in clinic today (left hand).      Sensation   Light Touch  Appears Intact      Coordination   Gross Motor Movements are Fluid and  Coordinated  No   Pt with hx cervical issues that inhibit full shoulder ROM.   Fine Motor Movements are Fluid and Coordinated  No      Edema   Edema  --   Minimal edema observed visually in clinic today L hand       OT Treatments/Exercises (OP) - 12/13/19 0001      ADLs   ADL Comments  Pt reports overall Mod I for lADL's and states that her husband is able to assist in cercumstances where she may need assistance. He agreed with this as he was with her for the duration of this appointment.    ADL Education Given  --   Do not use Left hand for lifting, push, pulling, daily actvi     Splinting   Splinting  Pt's bulky post-op dressing and splint were removed from her left hand in clinic. She was without noted drainage to volar lacerations or dorsal ring finger pin sites. She has minimal edema throughout her left hand upon observation in clinic. Pt/spouse were educated in possible signs and symptoms of infection and how to clean pin sites as well as how to care for splint. She was noted to have some maceration between her long and ring fingers, so a sterile 2x2 was placed in her third web space at this time. They were both educated in splinting use, care and precautions and she was the nfitted with a custom fabricated volar hand based splint taking care to avoid her dorsal pin sites as ordered by Dr Claudia Desanctis. This splint places her left hand ring and small fingers in MCP flexion ~65* and IP extension to both her ring and small fingers. She was educated in active ROM to her IP's of these digits. Her index and long fingers are free for active ROM as well. Both she and her husband were educated in splinting use, care and precautions and they verbalized understanding in the clinic.        WEARING SCHEDULE:  Wear splint at ALL times except for hygiene/pin care.  PURPOSE:  To prevent movement and for protection until injury can heal  CARE OF SPLINT:  Keep splint away from heat sources including:  stove, radiator or furnace, or a car in sunlight. The splint can melt and will no longer fit you properly  Keep away from pets and children  Clean the splint with rubbing alcohol 1-2 times per day.  * During this time, make sure you also clean your hand/arm as instructed by your therapist and/or perform dressing changes as needed. Then dry hand/arm completely before replacing splint. (When cleaning hand/arm, keep it immobilized in same position until splint is replaced)  PRECAUTIONS/POTENTIAL PROBLEMS: *If you notice or experience increased pain, swelling, numbness, or a lingering reddened area from the splint: Contact your therapist immediately by calling [phone]. You must wear the splint for protection, but we will get you scheduled for adjustments as quickly as possible.  (If only straps or hooks need to be replaced and NO adjustments to the splint need to be made, just call the office ahead and let them know you are coming in)  If you have any medical concerns or signs of infection, please call your doctor immediately  You may bend the tips of your ring and small fingers but do not use your left hand for any functional activity. No lifting, pushing, pulling or carrying items with this hand.   OT Education - 12/13/19 1135    Education Details  Custom protective splinting use, care and precautions L hand. Pin care left RF. No functional use left hand. Gentle active range of motion to IP's ring and small fingers.    Person(s) Educated  Patient;Spouse    Methods  Explanation;Demonstration;Verbal cues;Handout    Comprehension  Verbalized understanding       OT Short Term Goals - 12/13/19 1149      OT SHORT TERM GOAL #1   Title  Pt will be Mod I splinting use, care and precautions left hand.    Baseline  Min VC's required    Time  4    Period  Weeks    Status  New    Target Date  01/10/20      OT SHORT TERM GOAL #2   Title  Pt will be Mod I edema control left hand    Baseline   Ocassional vc's required    Time  4    Period  Weeks    Status  New    Target Date  01/10/20      OT SHORT TERM GOAL #3   Title  Pt will be Mod I HEP left hand    Baseline  dependent    Time  4    Period  Weeks    Status  New    Target Date  01/10/20        OT Long Term Goals - 12/13/19 1152      OT LONG TERM GOAL #1   Title  Pt will be Mod I scar management L hand    Baseline  dependent    Time  8    Period  Weeks    Status  New    Target Date  02/07/20      OT LONG TERM GOAL #2   Title  Pt will be Mod I HEP L hand    Baseline  dependent    Time  8    Period  Weeks    Status  New    Target Date  02/07/20      OT LONG TERM GOAL #3   Title  Pt will report pain in left hand as 3/10 or less during light ADL activities using left hand as non-dominant hand    Baseline  dependent    Time  8    Period  Weeks    Status  New    Target Date  02/07/20      OT LONG TERM GOAL #4   Title  Pt will demonstrate active range of motion within functional limits as seen by ability to to perform active finger flexion of ring and small fingers to within 2 cm of distal palmar crease [facility]).    Baseline  Unable    Time  8    Period  Weeks    Status  New    Target Date  02/07/20      OT LONG TERM GOAL #5   Title  Pt will demonstrate light grip strength of L non-dominant hand of 5# or greater as seen by JAMAR assessment    Baseline  unable    Time  8    Period  Weeks    Status  New    Target Date  02/07/20            Plan - 12/13/19 1139    Clinical Impression Statement  Pt is a right hand dominant female whom sustained a left proximal phalanx fracture and lacerations  on 11/29/2019 from a dog bite. She was seen by Dr Claudia Desanctis on 12/01/2019 for I & D to L long finger and pinning left dorsal proximal phalanx fracture. She has a significant PMH, please refer to chart tfor complete details. She is currently 1 week and 5 days post-op left hand injury. She presented today for custom  protective volar hand based splint to her left ring and small fingers taking care to avoid he pins dorsally. She plans to f/u with Dr pace on 12/14/2019 in the afternoon. She should benefit from continued out-pt OT to address pain in left hand, splinting adjustments, patient and family education, edema management, and home program as per proximal phalanx protocol.    OT Occupational Profile and History  Problem Focused Assessment - Including review of records relating to presenting problem    Occupational performance deficits (Please refer to evaluation for details):  ADL's;IADL's;Leisure    Body Structure / Function / Physical Skills  ADL;Dexterity;ROM;Edema;Wound;Scar mobility;Mobility;Flexibility;Strength;Coordination;FMC;Pain;UE functional use    Rehab Potential  Good    Clinical Decision Making  Limited treatment options, no task modification necessary    Comorbidities Affecting Occupational Performance:  May have comorbidities impacting occupational performance    Modification or Assistance to Complete Evaluation   No modification of tasks or assist necessary to complete eval    OT Frequency  Other (comment)   1x/week for 3 weeks followed by 2x/week for 4 additional weeks   OT Duration  --   see above   OT Treatment/Interventions  Self-care/ADL training;Fluidtherapy;Splinting;Therapeutic activities;Therapeutic exercise;Scar mobilization;Passive range of motion;Paraffin;Manual Therapy;Patient/family education;Moist Heat    Plan  Splint check and adjustment left custom volar protective. Issue writtenl HEP for IP active flexion as per proximal phalanx protocol. Note: Pt was 1 week and 5 days post-op on 12/13/2019.    Consulted and Agree with Plan of Care  Patient;Family member/caregiver    Family Member Consulted  Husband - [name] /Dan       Patient will benefit from skilled therapeutic intervention in order to improve the following deficits and impairments:   Body Structure / Function /  Physical Skills: ADL, Dexterity, ROM, Edema, Wound, Scar mobility, Mobility, Flexibility, Strength, Coordination, FMC, Pain, UE functional use       Visit Diagnosis: Stiffness of left hand, not elsewhere classified - Plan: Ot plan of care cert/re-cert  Muscle weakness (generalized) - Plan: Ot plan of care cert/re-cert  Localized edema - Plan: Ot plan of care cert/re-cert  Stiffness of finger joint, left - Plan: Ot plan of care cert/re-cert  Pain in joint of left hand - Plan: Ot plan of care cert/re-cert  Pain in left hand - Plan: Ot plan of care cert/re-cert    Problem List Patient Active Problem List   Diagnosis Date Noted  . Chronic otitis externa of right ear 07/27/2019  . Eustachian tube dysfunction, right 07/27/2019  . Chronic mycotic otitis externa 07/13/2019  . Dizziness 10/13/2018  . History of mastoiditis 10/13/2018  . Vasculitis of skin 08/22/2018  . Cervical radiculopathy 06/13/2018  . GAD (generalized anxiety disorder) 03/09/2018  . Mixed conductive and sensorineural hearing loss of right ear with restricted hearing of left ear 09/16/2017  . Chronic mastoiditis of left side 05/27/2017  . Fibromyalgia 06/19/2016  . Spondylosis of cervical joint 02/21/2016  . Depression 04/09/2015  . Gastritis 04/09/2015  . Uncomplicated alcohol dependence ([place]) 04/08/2015  . HTN (hypertension) 08/27/2014  . Allodynia 05/17/2013  . Chest pain syndrome 04/28/2013  . Mild HOCM (hypertrophic obstructive  cardiomyopathy) ([place]) 04/28/2013  . Palpitations 04/28/2013    Almyra Deforest, OTR/L 12/13/2019, 12:07 PM  [facility] [address] [place], [place], [address] Phone: [phone]   Fax:  [phone]  Name: DIOR DOMINIK MRN: [id number] Date of Birth: 1960-08-27

## 2019-12-14 ENCOUNTER — Encounter: Payer: Self-pay | Admitting: Plastic Surgery

## 2019-12-14 ENCOUNTER — Ambulatory Visit (INDEPENDENT_AMBULATORY_CARE_PROVIDER_SITE_OTHER): Payer: BLUE CROSS/BLUE SHIELD | Admitting: Plastic Surgery

## 2019-12-14 ENCOUNTER — Ambulatory Visit (HOSPITAL_COMMUNITY)
Admission: RE | Admit: 2019-12-14 | Discharge: 2019-12-14 | Disposition: A | Discharge status: Home / Self Care | Payer: BLUE CROSS/BLUE SHIELD | Source: Ambulatory Visit | Attending: Plastic Surgery | Admitting: Plastic Surgery

## 2019-12-14 VITALS — BP 132/78 | HR 59 | Ht 65.0 in | Wt 157.8 lb

## 2019-12-14 DIAGNOSIS — B369 Superficial mycosis, unspecified: Secondary | ICD-10-CM | POA: Insufficient documentation

## 2019-12-14 DIAGNOSIS — R208 Other disturbances of skin sensation: Secondary | ICD-10-CM | POA: Insufficient documentation

## 2019-12-14 DIAGNOSIS — R42 Dizziness and giddiness: Secondary | ICD-10-CM | POA: Diagnosis not present

## 2019-12-14 DIAGNOSIS — R002 Palpitations: Secondary | ICD-10-CM | POA: Diagnosis not present

## 2019-12-14 DIAGNOSIS — F1020 Alcohol dependence, uncomplicated: Secondary | ICD-10-CM | POA: Diagnosis not present

## 2019-12-14 DIAGNOSIS — M47812 Spondylosis without myelopathy or radiculopathy, cervical region: Secondary | ICD-10-CM | POA: Diagnosis not present

## 2019-12-14 DIAGNOSIS — H7012 Chronic mastoiditis, left ear: Secondary | ICD-10-CM | POA: Insufficient documentation

## 2019-12-14 DIAGNOSIS — L959 Vasculitis limited to the skin, unspecified: Secondary | ICD-10-CM

## 2019-12-14 DIAGNOSIS — F411 Generalized anxiety disorder: Secondary | ICD-10-CM

## 2019-12-14 DIAGNOSIS — H90A31 Mixed conductive and sensorineural hearing loss, unilateral, right ear with restricted hearing on the contralateral side: Secondary | ICD-10-CM | POA: Diagnosis not present

## 2019-12-14 DIAGNOSIS — H6240 Otitis externa in other diseases classified elsewhere, unspecified ear: Secondary | ICD-10-CM | POA: Insufficient documentation

## 2019-12-14 DIAGNOSIS — I421 Obstructive hypertrophic cardiomyopathy: Secondary | ICD-10-CM | POA: Insufficient documentation

## 2019-12-14 DIAGNOSIS — M5412 Radiculopathy, cervical region: Secondary | ICD-10-CM | POA: Diagnosis not present

## 2019-12-14 DIAGNOSIS — Z8669 Personal history of other diseases of the nervous system and sense organs: Secondary | ICD-10-CM | POA: Diagnosis not present

## 2019-12-14 DIAGNOSIS — R079 Chest pain, unspecified: Secondary | ICD-10-CM | POA: Diagnosis present

## 2019-12-14 DIAGNOSIS — M797 Fibromyalgia: Secondary | ICD-10-CM | POA: Diagnosis not present

## 2019-12-14 DIAGNOSIS — S62615A Displaced fracture of proximal phalanx of left ring finger, initial encounter for closed fracture: Secondary | ICD-10-CM

## 2019-12-14 DIAGNOSIS — H6981 Other specified disorders of Eustachian tube, right ear: Secondary | ICD-10-CM | POA: Insufficient documentation

## 2019-12-14 MED ORDER — AMOXICILLIN-POT CLAVULANATE 875-125 MG PO TABS: 1.0000 | ORAL_TABLET | Freq: Two times a day (BID) | ORAL | 0 refills | Status: DC

## 2019-12-14 NOTE — Progress Notes (Signed)
Patient is here 2 weeks out from closed reduction percutaneous pinning of her left ring finger.  The initial trauma was from a dog bite.  She is overall doing well and feels like things are progressing okay.  She has gotten her removable splint from therapy.  On exam the pins are in good position and feels stable.  There is a small amount of redness at the entrance site but no drainage.  The volar wounds that I washed out and reclosed show no signs of erythema or drainage or signs of infection.  X-ray shows good alignment of the fracture with stable pin placement.  Plan to leave the pins in for 2 more weeks and then I think she will be ready to have them removed.  That point should be able to be more aggressive with therapy.  I have asked her to continue to protect them for the time being.  I will see her another x-ray before her next appointment.

## 2019-12-21 ENCOUNTER — Ambulatory Visit: Payer: BLUE CROSS/BLUE SHIELD | Admitting: Occupational Therapy

## 2019-12-24 ENCOUNTER — Other Ambulatory Visit: Payer: Self-pay | Admitting: Family Medicine

## 2019-12-25 ENCOUNTER — Encounter: Payer: Self-pay | Admitting: Adult Health

## 2019-12-25 ENCOUNTER — Other Ambulatory Visit: Payer: Self-pay

## 2019-12-25 ENCOUNTER — Ambulatory Visit (INDEPENDENT_AMBULATORY_CARE_PROVIDER_SITE_OTHER): Payer: BLUE CROSS/BLUE SHIELD | Admitting: Adult Health

## 2019-12-25 DIAGNOSIS — F331 Major depressive disorder, recurrent, moderate: Secondary | ICD-10-CM

## 2019-12-25 DIAGNOSIS — F3181 Bipolar II disorder: Secondary | ICD-10-CM

## 2019-12-25 DIAGNOSIS — F4310 Post-traumatic stress disorder, unspecified: Secondary | ICD-10-CM

## 2019-12-25 DIAGNOSIS — F411 Generalized anxiety disorder: Secondary | ICD-10-CM | POA: Diagnosis not present

## 2019-12-25 MED ORDER — RISPERIDONE 1 MG PO TABS: ORAL_TABLET | ORAL | 1 refills | Status: DC

## 2019-12-25 MED ORDER — BUSPIRONE HCL 30 MG PO TABS: 15.0000 mg | ORAL_TABLET | Freq: Two times a day (BID) | ORAL | 1 refills | Status: DC

## 2019-12-25 MED ORDER — CITALOPRAM HYDROBROMIDE 20 MG PO TABS: 20.0000 mg | ORAL_TABLET | Freq: Every day | ORAL | 1 refills | Status: DC

## 2019-12-25 MED ORDER — ALPRAZOLAM 1 MG PO TABS: 1.0000 mg | ORAL_TABLET | Freq: Two times a day (BID) | ORAL | 2 refills | Status: DC | PRN

## 2019-12-25 NOTE — Progress Notes (Signed)
[name] [id number] Dec 05, 1960 59 y.o.  Subjective:   Patient ID:  [name] is a 59 y.o. (DOB [birth year]) female.  Chief Complaint: No chief complaint on file.   HPI [name] presents to the office today for follow-up of MDD, GAD, PTSD, BPD 2.   Describes mood today as "so-so". Pleasant. Denies tearfulness. Mood symptoms - reports depression, anxiety, and irritability. Mood is "up and down". Stating "I'm a nervous wreck". Also stating my anxiety is a 9.5. Re-injured hand yesterday. Getting pins out this week - recent surgery after being bit by dog. Has decided to surrender dog  - but there are no openings currently. More "emotional" having to keep him while waiting. Stating "I'm on edge all the time". Has double ear infections currently and can't use hearing aids. She and husband doing well - husband supportive. Varying interest and motivation. Taking medications as prescribed.  Energy levels vary - recovering from injury/surgery. .Active, does not have a regular exercise routine. Walking some days. Disabled. Enjoys some usual interests and activities. Married. Lives with husband and their 3 dogs. Seeing and talking to children - 1 son and 1 daughter. Spending time with family. Appetite adequate. Weight loss - a few pounds. Sleeps well most nights - "now I am". Averages 7 to 8 hours.  Focus and concentration stable. Completing tasks. Managing aspects of household.  Denies SI or HI. Denies AH or VH.  Previous medications - Abilify, Wellbutrin, Gabapentin, Lyrica, Prozac   Review of Systems:  Review of Systems  Musculoskeletal: Negative for gait problem.  Neurological: Negative for tremors.  Psychiatric/Behavioral:       Please refer to HPI    Medications: I have reviewed the patient's current medications.  Current Outpatient Medications  Medication Sig Dispense Refill  . acetaminophen (TYLENOL) 500 MG tablet Take 1,000 mg by mouth every 6 (six) hours as  needed for mild pain or headache.     . ALPRAZolam (XANAX) 1 MG tablet Take 1 tablet (1 mg total) by mouth 2 (two) times daily as needed for anxiety. 60 tablet 2  . amoxicillin-clavulanate (AUGMENTIN) 875-125 MG tablet Take 1 tablet by mouth every 12 (twelve) hours. 14 tablet 0  . amoxicillin-clavulanate (AUGMENTIN) 875-125 MG tablet Take 1 tablet by mouth 2 (two) times daily. 28 tablet 0  . bisacodyl (DULCOLAX) 5 MG EC tablet Take 5 mg by mouth 2 (two) times daily as needed for moderate constipation.     . busPIRone (BUSPAR) 30 MG tablet Take 0.5 tablets (15 mg total) by mouth 2 (two) times daily. 180 tablet 1  . citalopram (CELEXA) 20 MG tablet Take 1 tablet (20 mg total) by mouth daily. 90 tablet 1  . clobetasol ointment (TEMOVATE) 0.05 % APPLY TO AFFECTED AREA TWICE A DAY 30 g 1  . hydrochlorothiazide (HYDRODIURIL) 25 MG tablet TAKE 1 TABLET(25 MG) BY MOUTH DAILY 90 tablet 3  . HYDROcodone-acetaminophen (NORCO) 7.5-325 MG tablet Take 1 tablet by mouth every 8 (eight) hours as needed for moderate pain.    . hydrOXYzine (ATARAX/VISTARIL) 25 MG tablet TAKE 3 TO 4 TABLETS BY MOUTH EVERY 8 HOURS AS NEEDED FOR ANXIETY. MAX AMOUNT PER INSURANCE (Patient taking differently: Take 75-100 mg by mouth every 8 (eight) hours as needed for anxiety. TAKE 3 TO 4 TABLETS BY MOUTH EVERY 8 HOURS AS NEEDED FOR ANXIETY. MAX AMOUNT PER INSURANCE) 360 tablet 2  . levothyroxine (SYNTHROID) 50 MCG tablet TAKE 1 TABLET BY MOUTH EVERY DAY 90 tablet 0  .  loratadine (CLARITIN) 10 MG tablet Take 10 mg by mouth 2 (two) times daily as needed (for seasonal allergies).     . Magnesium 500 MG TABS Take 500 mg by mouth daily.    . meclizine (ANTIVERT) 25 MG tablet Take 1 tablet (25 mg total) by mouth 3 (three) times daily as needed for dizziness. 30 tablet 3  . methocarbamol (ROBAXIN) 500 MG tablet Take 1 tablet (500 mg total) by mouth every 8 (eight) hours as needed for muscle spasms. 90 tablet 2  . metoprolol tartrate (LOPRESSOR)  25 MG tablet Take 1 tablet (25 mg total) by mouth 2 (two) times daily. 180 tablet 3  . naproxen (NAPROSYN) 500 MG tablet TAKE 1 TAB DAILY AS NEEDED FOR PAIN. USE SPARINGLY. 30 tablet 1  . risperiDONE (RISPERDAL) 1 MG tablet Take one tablet at bedtime. 90 tablet 1  . rivaroxaban (XARELTO) 20 MG TABS tablet Take 1 tablet (20 mg total) by mouth daily with supper. 90 tablet 2  . vitamin B-12 (CYANOCOBALAMIN) 500 MCG tablet Take 500 mcg by mouth daily.     No current facility-administered medications for this visit.    Medication Side Effects: None  Allergies:  Allergies  Allergen Reactions  . Morphine And Related Other (See Comments)    "Didn't agree with me/wasn't very effective.."  . Lyrica [Pregabalin] Rash  . Neurontin [Gabapentin] Other (See Comments)    Left patient sleepy all the time    Past Medical History:  Diagnosis Date  . Back injury    Chronic  . Bipolar disorder ([place])   . Cardiomyopathy ([place])    hypertension controlled-helped with edema-Dr Nahser  . Deafness    Left ear  . Degenerative joint disease    with neck surgery  . Depression   . Diastolic dysfunction    with possible mild LVOT gradient  . Family history of adverse reaction to anesthesia    " my daughter has PONV and gets very grumpy"  . Fibromyalgia   . Former cigarette smoker   . Gestational diabetes   . H/O blood clots    due to side effect of medication  . Headache(784.0)    migraines  . Hypertension   . Hypothyroidism   . Leg cramps   . Neuropathy   . Pain    chronic   . Pneumonia   . PTSD (post-traumatic stress disorder)   . Respiratory disorder   . Schizophrenia ([place])     Family History  Problem Relation Age of Onset  . Coronary artery disease Father   . Parkinson's disease Mother   . Cancer Sister        breast  . Diabetes Sister   . Diabetes Brother     Social History   Socioeconomic History  . Marital status: Married    Spouse name: [name]  . Number of children: 2  .  Years of education: 57  . Highest education level: Not on file  Occupational History    Comment: homemaker  Tobacco Use  . Smoking status: Current Every Day Smoker    Packs/day: 0.50    Years: 25.00    Pack years: 12.50    Types: Cigarettes  . Smokeless tobacco: Never Used  Substance and Sexual Activity  . Alcohol use: Yes    Comment: very rare  . Drug use: No  . Sexual activity: Not on file  Other Topics Concern  . Not on file  Social History Narrative   Patient is married [name])  and lives at home with her husband.   Patient has two adult children.   Patient is currently not working.   Patient has a high school education.   Patient is right-handed.   Patient drinks 1-2 cups of coffee per day.   Social Determinants of Health   Financial Resource Strain:   . Difficulty of Paying Living Expenses:   Food Insecurity:   . Worried About Programme researcher, broadcasting/film/video in the Last Year:   . Barista in the Last Year:   Transportation Needs:   . Freight forwarder (Medical):   Marland Kitchen Lack of Transportation (Non-Medical):   Physical Activity:   . Days of Exercise per Week:   . Minutes of Exercise per Session:   Stress:   . Feeling of Stress :   Social Connections:   . Frequency of Communication with Friends and Family:   . Frequency of Social Gatherings with Friends and Family:   . Attends Religious Services:   . Active Member of Clubs or Organizations:   . Attends Banker Meetings:   Marland Kitchen Marital Status:   Intimate Partner Violence:   . Fear of Current or Ex-Partner:   . Emotionally Abused:   Marland Kitchen Physically Abused:   . Sexually Abused:     Past Medical History, Surgical history, Social history, and Family history were reviewed and updated as appropriate.   Please see review of systems for further details on the patient's review from today.   Objective:   Physical Exam:  There were no vitals taken for this visit.  Physical Exam Constitutional:      General:  She is not in acute distress. Musculoskeletal:        General: No deformity.  Neurological:     Mental Status: She is alert and oriented to person, place, and time.     Coordination: Coordination normal.  Psychiatric:        Attention and Perception: Attention and perception normal. She does not perceive auditory or visual hallucinations.        Mood and Affect: Mood is anxious and depressed. Affect is not labile, blunt, angry or inappropriate.        Speech: Speech normal.        Behavior: Behavior normal.        Thought Content: Thought content normal. Thought content is not paranoid or delusional. Thought content does not include homicidal or suicidal ideation. Thought content does not include homicidal or suicidal plan.        Cognition and Memory: Cognition and memory normal.        Judgment: Judgment normal.     Comments: Insight intact     Lab Review:     Component Value Date/Time   NA 138 12/01/2019 1033   K 4.5 12/01/2019 1033   CL 103 12/01/2019 1033   CO2 25 12/01/2019 1033   GLUCOSE 113 (H) 12/01/2019 1033   BUN 10 12/01/2019 1033   CREATININE 0.74 12/01/2019 1033   CREATININE 0.87 12/19/2015 1700   CALCIUM 8.7 (L) 12/01/2019 1033   PROT 6.4 11/14/2019 1108   ALBUMIN 3.9 11/14/2019 1108   AST 14 11/14/2019 1108   ALT 13 11/14/2019 1108   ALKPHOS 69 11/14/2019 1108   BILITOT 0.3 11/14/2019 1108   GFRNONAA >60 12/01/2019 1033   GFRAA >60 12/01/2019 1033       Component Value Date/Time   WBC 7.3 11/14/2019 1108   RBC 4.34 11/14/2019 1108  HGB 13.0 11/14/2019 1108   HCT 38.9 11/14/2019 1108   PLT 315.0 11/14/2019 1108   MCV 89.4 11/14/2019 1108   MCH 29.6 06/21/2018 1853   MCHC 33.4 11/14/2019 1108   RDW 21.3 (H) 11/14/2019 1108   LYMPHSABS 2.4 09/27/2014 1325   MONOABS 0.4 09/27/2014 1325   EOSABS 0.0 09/27/2014 1325   BASOSABS 0.0 09/27/2014 1325    No results found for: POCLITH, LITHIUM   No results found for: PHENYTOIN, PHENOBARB, VALPROATE,  CBMZ   .res Assessment: Plan:    Plan:  Buspar 30mg  BID D/C Valium 10mg  BID Add Xanax 1mg  BID for a month Celexa 20mg  daily Risperdal 1mg  at bedtime  Therapy -  Patient advised to contact office with any questions, adverse effects, or acute worsening in signs and symptoms.  Discussed potential benefits, risk, and side effects of benzodiazepines to include potential risk of tolerance and dependence, as well as possible drowsiness.  Advised patient not to drive if experiencing drowsiness and to take lowest possible effective dose to minimize risk of dependence and tolerance.  Discussed potential metabolic side effects associated with atypical antipsychotics, as well as potential risk for movement side effects. Advised pt to contact office if movement side effects occur.   Diagnoses and all orders for this visit:  GAD (generalized anxiety disorder) -     busPIRone (BUSPAR) 30 MG tablet; Take 0.5 tablets (15 mg total) by mouth 2 (two) times daily. -     ALPRAZolam (XANAX) 1 MG tablet; Take 1 tablet (1 mg total) by mouth 2 (two) times daily as needed for anxiety.  Major depressive disorder, recurrent episode, moderate (HCC) -     citalopram (CELEXA) 20 MG tablet; Take 1 tablet (20 mg total) by mouth daily. -     risperiDONE (RISPERDAL) 1 MG tablet; Take one tablet at bedtime.  Bipolar II disorder (HCC) -     risperiDONE (RISPERDAL) 1 MG tablet; Take one tablet at bedtime.  PTSD (post-traumatic stress disorder) -     risperiDONE (RISPERDAL) 1 MG tablet; Take one tablet at bedtime.     Please see After Visit Summary for patient specific instructions.  Future Appointments  Date Time Provider Department Center  12/27/2019  3:15 PM , MD [facility] None  12/28/2019  8:00 AM , OT [facility]  01/04/2020 11:45 AM Allena Napoleon, OT [facility]    No orders of the defined types were placed in this  encounter.   -------------------------------

## 2019-12-27 ENCOUNTER — Encounter: Payer: Self-pay | Admitting: Plastic Surgery

## 2019-12-27 ENCOUNTER — Other Ambulatory Visit: Payer: Self-pay

## 2019-12-27 ENCOUNTER — Ambulatory Visit (HOSPITAL_COMMUNITY)
Admission: RE | Admit: 2019-12-27 | Discharge: 2019-12-27 | Disposition: A | Discharge status: Home / Self Care | Payer: BLUE CROSS/BLUE SHIELD | Source: Ambulatory Visit | Attending: Plastic Surgery | Admitting: Plastic Surgery

## 2019-12-27 ENCOUNTER — Ambulatory Visit (INDEPENDENT_AMBULATORY_CARE_PROVIDER_SITE_OTHER): Payer: BLUE CROSS/BLUE SHIELD | Admitting: Plastic Surgery

## 2019-12-27 VITALS — BP 119/73 | HR 54 | Temp 97.7°F

## 2019-12-27 DIAGNOSIS — S62615A Displaced fracture of proximal phalanx of left ring finger, initial encounter for closed fracture: Secondary | ICD-10-CM | POA: Insufficient documentation

## 2019-12-27 DIAGNOSIS — G8929 Other chronic pain: Secondary | ICD-10-CM

## 2019-12-27 DIAGNOSIS — M542 Cervicalgia: Secondary | ICD-10-CM

## 2019-12-27 DIAGNOSIS — M797 Fibromyalgia: Secondary | ICD-10-CM

## 2019-12-27 MED ORDER — HYDROCODONE-ACETAMINOPHEN 7.5-325 MG PO TABS: 1.0000 | ORAL_TABLET | Freq: Three times a day (TID) | ORAL | 0 refills | Status: DC | PRN

## 2019-12-27 NOTE — Addendum Note (Signed)
Addended by: Scharlene Gloss B on: 12/27/2019 01:23 PM   Modules accepted: Orders

## 2019-12-27 NOTE — Progress Notes (Signed)
Patient is here postop from a ring finger proximal phalanx closed reduction percutaneous pinning.  She feels like she is doing well.  X-ray done today shows maintained alignment of the fracture that has been unchanged for the past 4 weeks.  On exam everything looks fine the volar lacerations are healing and there is no sign of the pin tract issue at this point.  I pulled the pins without any trouble.  I put her back in her splint and will have her start to work with therapy to get her range of motion back.  I will plan to see her again in about a month.  All her questions were answered.

## 2019-12-27 NOTE — Telephone Encounter (Signed)
Plz place referral, I will fill until she gets in with pain management. Ty.

## 2019-12-27 NOTE — Telephone Encounter (Signed)
Referral done/called the patient informed of PCP response (left detailed message)

## 2019-12-27 NOTE — Telephone Encounter (Signed)
I called her and her appointment with Dr. Danielle Dess is next Wednesday on 01/03/20. She wants a referral to pain management. Will you prescribe until her first pain mangement appt? She said DR. Elsner will not fill any more  She has had an injury to her hand as well.

## 2019-12-27 NOTE — Addendum Note (Signed)
Addended by: Radene Gunning on: 12/27/2019 01:12 PM   Modules accepted: Orders

## 2019-12-27 NOTE — Telephone Encounter (Signed)
Did she see Dr. Danielle Dess yet? If so, is this going to be remedied w surgery or do we need to get the ball rolling with a new pain clinic? Ty.

## 2019-12-27 NOTE — Telephone Encounter (Signed)
Patient called in to get a refill for HYDROcodone-acetaminophen (NORCO) 7.5-325 MG tablet [[id number]]    Please send it to [id number] - [place],  - [address]  [address] [place] [address]  Phone:  [phone] Fax:  [phone]  DEA #:  [id number]

## 2019-12-27 NOTE — Telephone Encounter (Signed)
Last OV---11/14/2019 No scheduled upcoming appt. Last RF--11/29/2019---#60 no refills No UDS/No CSC

## 2019-12-28 ENCOUNTER — Encounter: Payer: Self-pay | Admitting: Physical Medicine and Rehabilitation

## 2019-12-28 ENCOUNTER — Encounter: Payer: Self-pay | Admitting: Occupational Therapy

## 2019-12-28 ENCOUNTER — Ambulatory Visit: Payer: BLUE CROSS/BLUE SHIELD | Attending: Plastic Surgery | Admitting: Occupational Therapy

## 2019-12-28 ENCOUNTER — Other Ambulatory Visit: Payer: Self-pay | Admitting: Adult Health

## 2019-12-28 DIAGNOSIS — R600 Localized edema: Secondary | ICD-10-CM | POA: Insufficient documentation

## 2019-12-28 DIAGNOSIS — F411 Generalized anxiety disorder: Secondary | ICD-10-CM

## 2019-12-28 DIAGNOSIS — M6281 Muscle weakness (generalized): Secondary | ICD-10-CM | POA: Insufficient documentation

## 2019-12-28 DIAGNOSIS — M25542 Pain in joints of left hand: Secondary | ICD-10-CM | POA: Insufficient documentation

## 2019-12-28 DIAGNOSIS — M25642 Stiffness of left hand, not elsewhere classified: Secondary | ICD-10-CM | POA: Diagnosis not present

## 2019-12-28 DIAGNOSIS — F4310 Post-traumatic stress disorder, unspecified: Secondary | ICD-10-CM

## 2019-12-28 DIAGNOSIS — M25531 Pain in right wrist: Secondary | ICD-10-CM | POA: Insufficient documentation

## 2019-12-28 DIAGNOSIS — F331 Major depressive disorder, recurrent, moderate: Secondary | ICD-10-CM

## 2019-12-28 DIAGNOSIS — M79642 Pain in left hand: Secondary | ICD-10-CM | POA: Insufficient documentation

## 2019-12-28 DIAGNOSIS — F3181 Bipolar II disorder: Secondary | ICD-10-CM

## 2019-12-28 NOTE — Therapy (Signed)
[facility] [address] [place], [place], [address] Phone: [phone]   Fax:  [phone]  Occupational Therapy Treatment  Patient Details  Name: [name] MRN: [id number] Date of Birth: 09-10-1960 Referring Provider (OT): Dr Claudia Desanctis   Encounter Date: 12/28/2019  OT End of Session - 12/28/19 0817    Visit Number  2    Number of Visits  14    Date for OT Re-Evaluation  01/24/20    Authorization Type  BC[employer]    OT Start Time  0803    OT Stop Time  0845    OT Time Calculation (min)  42 min    Activity Tolerance  Patient tolerated treatment well;No increased pain    Behavior During Therapy  WFL for tasks assessed/performed       Past Medical History:  Diagnosis Date  . Back injury    Chronic  . Bipolar disorder ([place])   . Cardiomyopathy ([place])    hypertension controlled-helped with edema-Dr Nahser  . Deafness    Left ear  . Degenerative joint disease    with neck surgery  . Depression   . Diastolic dysfunction    with possible mild LVOT gradient  . Family history of adverse reaction to anesthesia    " my daughter has PONV and gets very grumpy"  . Fibromyalgia   . Former cigarette smoker   . Gestational diabetes   . H/O blood clots    due to side effect of medication  . Headache(784.0)    migraines  . Hypertension   . Hypothyroidism   . Leg cramps   . Neuropathy   . Pain    chronic   . Pneumonia   . PTSD (post-traumatic stress disorder)   . Respiratory disorder   . Schizophrenia [facility])     Past Surgical History:  Procedure Laterality Date  . ABDOMINAL HYSTERECTOMY     partial  . CARDIAC CATHETERIZATION     Ejection Fraction 65-70%  . CERVICAL DISC ARTHROPLASTY N/A 02/21/2016   Procedure: Cervical three-four artificial disc replacement;  Surgeon: Kristeen Miss, MD;  Location: [facility];  Service: Neurosurgery;  Laterality: N/A;  . CERVICAL FUSION     C5-6  . EXTERNAL EAR SURGERY     tumor  at age 34, 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  . HERNIA REPAIR     " as a baby"  . NECK SURGERY    . OTHER SURGICAL HISTORY     History of cervical and lumbar disk surgery  . PERCUTANEOUS PINNING Left 12/01/2019   Procedure: Closed reduction percutaneous pinning left ring finger proximal phalanx fracture;  Surgeon: Cindra Presume, MD;  Location: [facility];  Service: Plastics;  Laterality: Left;  . polypectomies     tubes  . POSTERIOR CERVICAL LAMINECTOMY WITH MET- RX Left 06/13/2018   Procedure: Left Cervical three-four Laminotomy/foraminotomy;  Surgeon: Kristeen Miss, MD;  Location: [facility];  Service: Neurosurgery;  Laterality: Left;  . TONSILLECTOMY AND ADENOIDECTOMY      There were no vitals filed for this visit.  Subjective Assessment - 12/28/19 0815    Subjective   Pt is a right hand dominant female whom sustained a dog bite to her left non-dominant hand on 11/29/19 and underwent pinning of her ring finger proximal phalanx per Dr Claudia Desanctis on 12/01/19. She is now 1 week and 5 days post op. She presents for volar splinting. She will return to Dr Claudia Desanctis on 12/14/19    Patient  is accompanied by:  Family member   Husband Sherron Monday   Pertinent History  Pt has significant PMH. Please refer to chart for complete details. Back injury (cervical), bipolar disorder, deafness, depression, DJD, fibromyalgia, HTN, hypothyroidism, neuropathy, leg cramps, schizophrenia, PTSD, respiratory disorder, cardiomyopathy.    Patient Stated Goals  Hoping to use fingers again on left hand.    Currently in Pain?  Yes    Pain Score  7     Pain Location  Hand    Pain Orientation  Left    Pain Descriptors / Indicators  Aching    Pain Type  Acute pain    Pain Onset  1 to 4 weeks ago    Pain Frequency  Intermittent    Aggravating Factors   movement    Pain Relieving Factors  heat              Treatment: Pt arrived wearing splint with hand wrapped in gauze. Pt's hand was washed with soap and water at the sink and dried thoroughly.  Finger stockinette applied over middle finger due to wounds at base. Hand was dressed with tensogrip. Hot pack was applied x 10 mins to left hand and wrist for pain relief, no adverse reactions. Padding added to dosal protion of splint as swelling has decreased , pt may benefit from splint modification next visit.             OT Education - 12/28/19 1300    Education Details  splint wear, care prec, hand hygeine, inital A/ROM HEP.    Person(s) Educated  Patient;Spouse    Methods  Explanation;Demonstration;Verbal cues;Handout    Comprehension  Verbalized understanding;Returned demonstration       OT Short Term Goals - 12/13/19 1149      OT SHORT TERM GOAL #1   Title  Pt will be Mod I splinting use, care and precautions left hand.    Baseline  Min VC's required    Time  4    Period  Weeks    Status  New    Target Date  01/10/20      OT SHORT TERM GOAL #2   Title  Pt will be Mod I edema control left hand    Baseline  Ocassional vc's required    Time  4    Period  Weeks    Status  New    Target Date  01/10/20      OT SHORT TERM GOAL #3   Title  Pt will be Mod I HEP left hand    Baseline  dependent    Time  4    Period  Weeks    Status  New    Target Date  01/10/20        OT Long Term Goals - 12/13/19 1152      OT LONG TERM GOAL #1   Title  Pt will be Mod I scar management L hand    Baseline  dependent    Time  8    Period  Weeks    Status  New    Target Date  02/07/20      OT LONG TERM GOAL #2   Title  Pt will be Mod I HEP L hand    Baseline  dependent    Time  8    Period  Weeks    Status  New    Target Date  02/07/20      OT LONG TERM GOAL #3  Title  Pt will report pain in left hand as 3/10 or less during light ADL activities using left hand as non-dominant hand    Baseline  dependent    Time  8    Period  Weeks    Status  New    Target Date  02/07/20      OT LONG TERM GOAL #4   Title  Pt will demonstrate active range of motion within  functional limits as seen by ability to to perform active finger flexion of ring and small fingers to within 2 cm of distal palmar crease [facility]).    Baseline  Unable    Time  8    Period  Weeks    Status  New    Target Date  02/07/20      OT LONG TERM GOAL #5   Title  Pt will demonstrate light grip strength of L non-dominant hand of 5# or greater as seen by JAMAR assessment    Baseline  unable    Time  8    Period  Weeks    Status  New    Target Date  02/07/20            Plan - 12/28/19 1251    Clinical Impression Statement  Pt is progressing towards goals. Pt's pins were removed by Dr. Claudia Desanctis. Pt demonstrates understanding of inital HEP, however she can benefit from review    OT Occupational Profile and History  Problem Focused Assessment - Including review of records relating to presenting problem    Occupational performance deficits (Please refer to evaluation for details):  ADL's;IADL's;Leisure    Body Structure / Function / Physical Skills  ADL;Dexterity;ROM;Edema;Wound;Scar mobility;Mobility;Flexibility;Strength;Coordination;FMC;Pain;UE functional use    Rehab Potential  Good    Clinical Decision Making  Limited treatment options, no task modification necessary    Comorbidities Affecting Occupational Performance:  May have comorbidities impacting occupational performance    Modification or Assistance to Complete Evaluation   No modification of tasks or assist necessary to complete eval    OT Frequency  Other (comment)   1x/week for 3 weeks followed by 2x/week for 4 additional weeks   OT Duration  --   see above   OT Treatment/Interventions  Self-care/ADL training;Fluidtherapy;Splinting;Therapeutic activities;Therapeutic exercise;Scar mobilization;Passive range of motion;Paraffin;Manual Therapy;Patient/family education;Moist Heat    Plan  splint modifications, review A/ROM HEP, progress per proximal phalannx protocol, s/p pinning    Consulted and Agree with Plan of Care   Patient;Family member/caregiver    Family Member Consulted  Husband - [name] /Dan       Patient will benefit from skilled therapeutic intervention in order to improve the following deficits and impairments:   Body Structure / Function / Physical Skills: ADL, Dexterity, ROM, Edema, Wound, Scar mobility, Mobility, Flexibility, Strength, Coordination, FMC, Pain, UE functional use       Visit Diagnosis: Stiffness of left hand, not elsewhere classified  Muscle weakness (generalized)  Localized edema  Stiffness of finger joint, left  Pain in joint of left hand  Pain in left hand    Problem List Patient Active Problem List   Diagnosis Date Noted  . Chronic otitis externa of right ear 07/27/2019  . Dysfunction of right eustachian tube 07/27/2019  . Chronic mycotic otitis externa 07/13/2019  . Dizziness 10/13/2018  . History of mastoiditis 10/13/2018  . Vasculitis of skin 08/22/2018  . Cervical radiculopathy 06/13/2018  . GAD (generalized anxiety disorder) 03/09/2018  . Mixed conductive and sensorineural hearing loss  of both ears 09/16/2017  . Chronic mastoiditis of left side 05/27/2017  . Fibromyalgia 06/19/2016  . Spondylosis of cervical joint 02/21/2016  . Depression 04/09/2015  . Gastritis 04/09/2015  . Uncomplicated alcohol dependence ([place]) 04/08/2015  . Hypertension 08/27/2014  . Allodynia 05/17/2013  . Chest pain syndrome 04/28/2013  . Mild HOCM (hypertrophic obstructive cardiomyopathy) ([place]) 04/28/2013  . Palpitations 04/28/2013    Daquan Crapps 12/28/2019, 1:01 PM Theone Murdoch, OTR/L [phone] Phone: [phone] 1:02 PM 12/28/19 [facility] [address] [place], [place], [address] Phone: [phone]   Fax:  [phone]  Name: [name] MRN: [id number] Date of Birth: [birth year]

## 2019-12-28 NOTE — Patient Instructions (Signed)
MP Flexion (Active Isolated)   Bend _each_____ finger at large knuckle, keeping other fingers straight. Do not bend tips. Repeat _10-15___ times. Do __4-6__ sessions per day.  AROM: PIP Flexion / Extension   Pinch bottom knuckle of ___ring and small_____ finger of hand to prevent bending. Actively bend middle knuckle until stretch is felt. Hold __5__ seconds. Relax. Straighten finger as far as possible. Repeat __10-15__ times per set. Do _4-6___ sessions per day.   AROM: DIP Flexion / Extension   Pinch middle knuckle of ___ring and small_____ finger of  hand to prevent bending. Bend end knuckle until stretch is felt. Hold _5___ seconds. Relax. Straighten finger as far as possible. Repeat _10-15___ times per set.  Do _4-6___ sessions per day.  AROM: Finger Flexion / Extension   Actively bend fingers of  hand. Start with knuckles furthest from palm, and slowly make a fist. Hold __5__ seconds. Relax. Then straighten fingers as far as possible. Repeat _10-15___ times per set.  Do _4-6___ sessions per day.  Copyright  VHI. All rights reserved.   Flexor Tendon Gliding (Active Hook Fist)   With fingers and knuckles straight, bend middle and tip joints. Do not bend large knuckles. Repeat _10-15___ times. Do _4-6___ sessions per day.  MP Flexion (Active)   With back of hand on table, bend large knuckles as far as they will go, keeping small joints straight. Repeat _10-15___ times. Do __4-6__ sessions per day. Activity: Reach into a narrow container.*      Finger Flexion / Extension   With palm up, bend fingers of left hand toward palm, making a  fist. Straighten fingers, opening fist. Repeat sequence _10-15___ times per session. Do _4-6__ sessions per day. Hand Variation: Palm down   Copyright  VHI. All rights reserved.  Opposition (Active)   Touch tip of thumb to nail tip of each finger in turn, making an "O" shape. Repeat __10__ times. Do _4-6___ sessions per  day.   MP Flexion (Active)   Bend thumb to touch base of little finger, keeping tip joint straight. Repeat __10-15__ times. Do _4-6___ sessions per day.       I

## 2020-01-03 DIAGNOSIS — M4312 Spondylolisthesis, cervical region: Secondary | ICD-10-CM | POA: Diagnosis not present

## 2020-01-03 DIAGNOSIS — M5412 Radiculopathy, cervical region: Secondary | ICD-10-CM | POA: Diagnosis not present

## 2020-01-04 ENCOUNTER — Other Ambulatory Visit: Payer: Self-pay

## 2020-01-04 ENCOUNTER — Telehealth: Payer: Self-pay

## 2020-01-04 ENCOUNTER — Ambulatory Visit: Payer: BLUE CROSS/BLUE SHIELD | Admitting: Occupational Therapy

## 2020-01-04 DIAGNOSIS — M25642 Stiffness of left hand, not elsewhere classified: Secondary | ICD-10-CM

## 2020-01-04 DIAGNOSIS — M6281 Muscle weakness (generalized): Secondary | ICD-10-CM

## 2020-01-04 DIAGNOSIS — R600 Localized edema: Secondary | ICD-10-CM

## 2020-01-04 DIAGNOSIS — M79642 Pain in left hand: Secondary | ICD-10-CM | POA: Diagnosis not present

## 2020-01-04 DIAGNOSIS — M25531 Pain in right wrist: Secondary | ICD-10-CM | POA: Diagnosis not present

## 2020-01-04 DIAGNOSIS — M25542 Pain in joints of left hand: Secondary | ICD-10-CM

## 2020-01-04 NOTE — Telephone Encounter (Signed)
Hello Dr. Arita Miss,   I saw [name] today for A/ROM and she is very limited in her IP joints motion Lt ring finger. She returns to therapy on 01/10/20 which will place her just under 6 weeks post op (by 2 days). Do you think we could start P/ROM to the ring finger at that time? Also, can we d/c splint at that time? You do not see her again until 01/25/20.  Thank you for clarification.  (Also appreciate your prompt responses with all past patients! It has been so helpful) [name], OTR/L

## 2020-01-04 NOTE — Telephone Encounter (Signed)
Yes feel free to start passive range of motion and discontinue the splint at any time. Thanks.

## 2020-01-04 NOTE — Therapy (Signed)
[facility] [address] [place], [place], [address] Phone: [phone]   Fax:  [phone]  Occupational Therapy Treatment  Patient Details  Name: [name] MRN: [id number] Date of Birth: 29-Mar-1961 Referring Provider (OT): Dr Claudia Desanctis   Encounter Date: 01/04/2020  OT End of Session - 01/04/20 1748    Visit Number  3    Number of Visits  14    Date for OT Re-Evaluation  01/24/20    Authorization Type  BCBS    OT Start Time  1150    OT Stop Time  1884    OT Time Calculation (min)  45 min    Activity Tolerance  Patient tolerated treatment well;No increased pain    Behavior During Therapy  WFL for tasks assessed/performed       Past Medical History:  Diagnosis Date  . Back injury    Chronic  . Bipolar disorder ([place])   . Cardiomyopathy ([place])    hypertension controlled-helped with edema-Dr Nahser  . Deafness    Left ear  . Degenerative joint disease    with neck surgery  . Depression   . Diastolic dysfunction    with possible mild LVOT gradient  . Family history of adverse reaction to anesthesia    " my daughter has PONV and gets very grumpy"  . Fibromyalgia   . Former cigarette smoker   . Gestational diabetes   . H/O blood clots    due to side effect of medication  . Headache(784.0)    migraines  . Hypertension   . Hypothyroidism   . Leg cramps   . Neuropathy   . Pain    chronic   . Pneumonia   . PTSD (post-traumatic stress disorder)   . Respiratory disorder   . Schizophrenia [facility])     Past Surgical History:  Procedure Laterality Date  . ABDOMINAL HYSTERECTOMY     partial  . CARDIAC CATHETERIZATION     Ejection Fraction 65-70%  . CERVICAL DISC ARTHROPLASTY N/A 02/21/2016   Procedure: Cervical three-four artificial disc replacement;  Surgeon: Kristeen Miss, MD;  Location: [facility];  Service: Neurosurgery;  Laterality: N/A;  . CERVICAL FUSION     C5-6  . EXTERNAL EAR SURGERY     tumor at age 75, 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  . HERNIA REPAIR     " as a baby"  . NECK SURGERY    . OTHER SURGICAL HISTORY     History of cervical and lumbar disk surgery  . PERCUTANEOUS PINNING Left 12/01/2019   Procedure: Closed reduction percutaneous pinning left ring finger proximal phalanx fracture;  Surgeon: Cindra Presume, MD;  Location: [facility];  Service: Plastics;  Laterality: Left;  . polypectomies     tubes  . POSTERIOR CERVICAL LAMINECTOMY WITH MET- RX Left 06/13/2018   Procedure: Left Cervical three-four Laminotomy/foraminotomy;  Surgeon: Kristeen Miss, MD;  Location: [facility];  Service: Neurosurgery;  Laterality: Left;  . TONSILLECTOMY AND ADENOIDECTOMY      There were no vitals filed for this visit.  Subjective Assessment - 01/04/20 1151    Subjective   The review was helpful (re: HEP)    Pertinent History  Pt has significant PMH. Please refer to chart for complete details. Back injury (cervical), bipolar disorder, deafness, depression, DJD, fibromyalgia, HTN, hypothyroidism, neuropathy, leg cramps, schizophrenia, PTSD, respiratory disorder, cardiomyopathy.    Patient Stated Goals  Hoping to use fingers again on left hand.    Currently  in Pain?  Yes    Pain Score  7     Pain Location  Hand    Pain Orientation  Left    Pain Descriptors / Indicators  Aching    Pain Type  Acute pain    Pain Onset  More than a month ago    Pain Frequency  Intermittent    Aggravating Factors   movement    Pain Relieving Factors  heat       Pt is now almost 5 weeks post op. Reviewed A/ROM HEP with emphasis on IP flexion compositely and then blocking ex's for isolated PIP flex and DIP flexion. Pt had limited movement at PIP and DIP joints Lt ring finger. Also emphasized full extension b/t each exercise and composite flexion of hand. Pt appreciated review. Stressed the importance of regaining A/ROM critical at this time.  Ultrasound x 6 min to 3rd and 4th fingers and base of fingers in palm at 3 Mhz,  20% pulsed, 0.8 wts/cm2 to manage scar tissue.  Reviewed hand hygiene and keeping b/t fingers dry - pt only has one remaining scab b/t long and ring finger - anticipate this will naturally fall off in the next day or two. Cleaned hand well after ultrasound and provided pt with new tensogrip and finger stockinette.                       OT Short Term Goals - 01/04/20 1749      OT SHORT TERM GOAL #1   Title  Pt will be Mod I splinting use, care and precautions left hand.    Baseline  Min VC's required    Time  4    Period  Weeks    Status  Achieved    Target Date  01/10/20      OT SHORT TERM GOAL #2   Title  Pt will be Mod I edema control left hand    Baseline  Ocassional vc's required    Time  4    Period  Weeks    Status  On-going    Target Date  01/10/20      OT SHORT TERM GOAL #3   Title  Pt will be Mod I HEP left hand    Baseline  dependent    Time  4    Period  Weeks    Status  Achieved    Target Date  01/10/20        OT Long Term Goals - 12/13/19 1152      OT LONG TERM GOAL #1   Title  Pt will be Mod I scar management L hand    Baseline  dependent    Time  8    Period  Weeks    Status  New    Target Date  02/07/20      OT LONG TERM GOAL #2   Title  Pt will be Mod I HEP L hand    Baseline  dependent    Time  8    Period  Weeks    Status  New    Target Date  02/07/20      OT LONG TERM GOAL #3   Title  Pt will report pain in left hand as 3/10 or less during light ADL activities using left hand as non-dominant hand    Baseline  dependent    Time  8    Period  Weeks    Status  New    Target Date  02/07/20      OT LONG TERM GOAL #4   Title  Pt will demonstrate active range of motion within functional limits as seen by ability to to perform active finger flexion of ring and small fingers to within 2 cm of distal palmar crease [facility]).    Baseline  Unable    Time  8    Period  Weeks    Status  New    Target Date  02/07/20      OT LONG  TERM GOAL #5   Title  Pt will demonstrate light grip strength of L non-dominant hand of 5# or greater as seen by JAMAR assessment    Baseline  unable    Time  8    Period  Weeks    Status  New    Target Date  02/07/20            Plan - 01/04/20 1749    Clinical Impression Statement  Pt has met 2 STG's. Pt w/ decreased edema and increased ROM of hand after review of HEP however limited PIP and DIP motion Lt ring finger    Occupational performance deficits (Please refer to evaluation for details):  ADL's;IADL's;Leisure    Body Structure / Function / Physical Skills  ADL;Dexterity;ROM;Edema;Wound;Scar mobility;Mobility;Flexibility;Strength;Coordination;FMC;Pain;UE functional use    Rehab Potential  Good    OT Frequency  1x / week   for 3 weeks, followed by 2x/wk for 4 weeks   OT Treatment/Interventions  Self-care/ADL training;Fluidtherapy;Splinting;Therapeutic activities;Therapeutic exercise;Scar mobilization;Passive range of motion;Paraffin;Manual Therapy;Patient/family education;Moist Heat    Plan  continue [place], A/ROM, ? beginning P/ROM and d/c splint next week if MD clears (telephone encounter sent)    Consulted and Agree with Plan of Care  Patient       Patient will benefit from skilled therapeutic intervention in order to improve the following deficits and impairments:   Body Structure / Function / Physical Skills: ADL, Dexterity, ROM, Edema, Wound, Scar mobility, Mobility, Flexibility, Strength, Coordination, FMC, Pain, UE functional use       Visit Diagnosis: Stiffness of left hand, not elsewhere classified  Muscle weakness (generalized)  Localized edema  Stiffness of finger joint, left  Pain in joint of left hand    Problem List Patient Active Problem List   Diagnosis Date Noted  . Chronic otitis externa of right ear 07/27/2019  . Dysfunction of right eustachian tube 07/27/2019  . Chronic mycotic otitis externa 07/13/2019  . Dizziness 10/13/2018  . History of  mastoiditis 10/13/2018  . Vasculitis of skin 08/22/2018  . Cervical radiculopathy 06/13/2018  . GAD (generalized anxiety disorder) 03/09/2018  . Mixed conductive and sensorineural hearing loss of both ears 09/16/2017  . Chronic mastoiditis of left side 05/27/2017  . Fibromyalgia 06/19/2016  . Spondylosis of cervical joint 02/21/2016  . Depression 04/09/2015  . Gastritis 04/09/2015  . Uncomplicated alcohol dependence ([place]) 04/08/2015  . Hypertension 08/27/2014  . Allodynia 05/17/2013  . Chest pain syndrome 04/28/2013  . Mild HOCM (hypertrophic obstructive cardiomyopathy) ([place]) 04/28/2013  . Palpitations 04/28/2013    Carey Bullocks, OTR/L 01/04/2020, 5:52 PM  [facility] [address] [place], [place], [address] Phone: [phone]   Fax:  [phone]  Name: [name] MRN: [id number] Date of Birth: July 21, 1961

## 2020-01-10 ENCOUNTER — Other Ambulatory Visit: Payer: Self-pay

## 2020-01-10 ENCOUNTER — Ambulatory Visit: Payer: BLUE CROSS/BLUE SHIELD | Admitting: Occupational Therapy

## 2020-01-10 ENCOUNTER — Encounter: Payer: Self-pay | Admitting: Occupational Therapy

## 2020-01-10 DIAGNOSIS — M6281 Muscle weakness (generalized): Secondary | ICD-10-CM

## 2020-01-10 DIAGNOSIS — M25642 Stiffness of left hand, not elsewhere classified: Secondary | ICD-10-CM

## 2020-01-10 DIAGNOSIS — M25542 Pain in joints of left hand: Secondary | ICD-10-CM | POA: Diagnosis not present

## 2020-01-10 DIAGNOSIS — R600 Localized edema: Secondary | ICD-10-CM | POA: Diagnosis not present

## 2020-01-10 DIAGNOSIS — M25531 Pain in right wrist: Secondary | ICD-10-CM | POA: Diagnosis not present

## 2020-01-10 DIAGNOSIS — M79642 Pain in left hand: Secondary | ICD-10-CM

## 2020-01-10 NOTE — Patient Instructions (Signed)
PROM: Finger MP Joints   Passively bend __ring______ finger of hand at big knuckle until stretch is felt. Hold _10___ seconds. Relax. Straighten finger as far as possible. Repeat __5__ times per set.  Do __4-6__ sessions per day.   PIP Flexion (Passive)   Use other hand to bend the middle joint of __ring____ finger down as far as possible. Hold _10___ seconds. Repeat __5__ times. Do _4-6___ sessions per day.    PROM: Finger DIP Joints   Passively bend __ring______ finger(s) of  hand at tip joint until stretch is felt. Hold __10__ seconds. Relax. Straighten finger as far as possible. Repeat _5___ times per set.  Do __4-6__ sessions per day.  Copyright  VHI. All rights reserved.

## 2020-01-10 NOTE — Therapy (Signed)
[facility] [address] [place], [place], [address] Phone: [phone]   Fax:  [phone]  Occupational Therapy Treatment  Patient Details  Name: [name] MRN: [id number] Date of Birth: [birth year] Referring Provider (OT): Dr Claudia Desanctis   Encounter Date: 01/10/2020  OT End of Session - 01/10/20 1719    Visit Number  4    Number of Visits  14    Date for OT Re-Evaluation  01/24/20    Authorization Type  BCBS    OT Start Time  1703    OT Stop Time  5462    OT Time Calculation (min)  40 min    Activity Tolerance  Patient tolerated treatment well;No increased pain    Behavior During Therapy  WFL for tasks assessed/performed       Past Medical History:  Diagnosis Date  . Back injury    Chronic  . Bipolar disorder ([place])   . Cardiomyopathy ([place])    hypertension controlled-helped with edema-Dr Nahser  . Deafness    Left ear  . Degenerative joint disease    with neck surgery  . Depression   . Diastolic dysfunction    with possible mild LVOT gradient  . Family history of adverse reaction to anesthesia    " my daughter has PONV and gets very grumpy"  . Fibromyalgia   . Former cigarette smoker   . Gestational diabetes   . H/O blood clots    due to side effect of medication  . Headache(784.0)    migraines  . Hypertension   . Hypothyroidism   . Leg cramps   . Neuropathy   . Pain    chronic   . Pneumonia   . PTSD (post-traumatic stress disorder)   . Respiratory disorder   . Schizophrenia [facility])     Past Surgical History:  Procedure Laterality Date  . ABDOMINAL HYSTERECTOMY     partial  . CARDIAC CATHETERIZATION     Ejection Fraction 65-70%  . CERVICAL DISC ARTHROPLASTY N/A 02/21/2016   Procedure: Cervical three-four artificial disc replacement;  Surgeon: Kristeen Miss, MD;  Location: [facility];  Service: Neurosurgery;  Laterality: N/A;  . CERVICAL FUSION     C5-6  . EXTERNAL EAR SURGERY      tumor at age 54, 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  . HERNIA REPAIR     " as a baby"  . NECK SURGERY    . OTHER SURGICAL HISTORY     History of cervical and lumbar disk surgery  . PERCUTANEOUS PINNING Left 12/01/2019   Procedure: Closed reduction percutaneous pinning left ring finger proximal phalanx fracture;  Surgeon: Cindra Presume, MD;  Location: [facility];  Service: Plastics;  Laterality: Left;  . polypectomies     tubes  . POSTERIOR CERVICAL LAMINECTOMY WITH MET- RX Left 06/13/2018   Procedure: Left Cervical three-four Laminotomy/foraminotomy;  Surgeon: Kristeen Miss, MD;  Location: [facility];  Service: Neurosurgery;  Laterality: Left;  . TONSILLECTOMY AND ADENOIDECTOMY      There were no vitals filed for this visit.  Subjective Assessment - 01/10/20 1713    Subjective   Pt reports mild pain in hand    Pertinent History  Pt has significant PMH. Please refer to chart for complete details. Back injury (cervical), bipolar disorder, deafness, depression, DJD, fibromyalgia, HTN, hypothyroidism, neuropathy, leg cramps, schizophrenia, PTSD, respiratory disorder, cardiomyopathy.    Limitations  Pt is now cleared for P/ROM and she may d/c splint per Dr.  Pace telephone order    Patient Stated Goals  Hoping to use fingers again on left hand.    Currently in Pain?  Yes    Pain Location  Hand    Pain Orientation  Left    Pain Descriptors / Indicators  Aching    Pain Type  Acute pain    Pain Onset  More than a month ago    Pain Frequency  Intermittent    Aggravating Factors   movement    Pain Relieving Factors  heat              Treatment:fluidotherapy x 12 mins for pain and stiffness with pt performing A/ROM while in fluido. No adverse reactions. [place] 3 mhz, 0.8 w/cm2, 20% x 8 mins no adverse reactions. A/ROM composite, MP and PIP flexion followed by eduction regarding passive PIP, DIP blocking then composite finger flexion. Pt returned demonstration.            OT Education - 01/10/20 1714     Education Details  P/ROM HEP, d/c of splint per MD orders    Person(s) Educated  Patient;Spouse    Methods  Explanation;Demonstration;Verbal cues;Handout    Comprehension  Verbalized understanding;Returned demonstration;Verbal cues required       OT Short Term Goals - 01/04/20 1749      OT SHORT TERM GOAL #1   Title  Pt will be Mod I splinting use, care and precautions left hand.    Baseline  Min VC's required    Time  4    Period  Weeks    Status  Achieved    Target Date  01/10/20      OT SHORT TERM GOAL #2   Title  Pt will be Mod I edema control left hand    Baseline  Ocassional vc's required    Time  4    Period  Weeks    Status  On-going    Target Date  01/10/20      OT SHORT TERM GOAL #3   Title  Pt will be Mod I HEP left hand    Baseline  dependent    Time  4    Period  Weeks    Status  Achieved    Target Date  01/10/20        OT Long Term Goals - 12/13/19 1152      OT LONG TERM GOAL #1   Title  Pt will be Mod I scar management L hand    Baseline  dependent    Time  8    Period  Weeks    Status  New    Target Date  02/07/20      OT LONG TERM GOAL #2   Title  Pt will be Mod I HEP L hand    Baseline  dependent    Time  8    Period  Weeks    Status  New    Target Date  02/07/20      OT LONG TERM GOAL #3   Title  Pt will report pain in left hand as 3/10 or less during light ADL activities using left hand as non-dominant hand    Baseline  dependent    Time  8    Period  Weeks    Status  New    Target Date  02/07/20      OT LONG TERM GOAL #4   Title  Pt will demonstrate active range of motion within  functional limits as seen by ability to to perform active finger flexion of ring and small fingers to within 2 cm of distal palmar crease [facility]).    Baseline  Unable    Time  8    Period  Weeks    Status  New    Target Date  02/07/20      OT LONG TERM GOAL #5   Title  Pt will demonstrate light grip strength of L non-dominant hand of 5# or greater as  seen by JAMAR assessment    Baseline  unable    Time  8    Period  Weeks    Status  New    Target Date  02/07/20            Plan - 01/10/20 1721    Clinical Impression Statement  Pt is progressing towards goals with improving ROM.    Occupational performance deficits (Please refer to evaluation for details):  ADL's;IADL's;Leisure    Body Structure / Function / Physical Skills  ADL;Dexterity;ROM;Edema;Wound;Scar mobility;Mobility;Flexibility;Strength;Coordination;FMC;Pain;UE functional use    Rehab Potential  Good    OT Frequency  1x / week   for 3 weeks, followed by 2x/wk for 4 weeks   OT Treatment/Interventions  Self-care/ADL training;Fluidtherapy;Splinting;Therapeutic activities;Therapeutic exercise;Scar mobilization;Passive range of motion;Paraffin;Manual Therapy;Patient/family education;Moist Heat    Plan  continue [place], A/ROM, P/ROM, splint now d/c    Consulted and Agree with Plan of Care  Patient       Patient will benefit from skilled therapeutic intervention in order to improve the following deficits and impairments:   Body Structure / Function / Physical Skills: ADL, Dexterity, ROM, Edema, Wound, Scar mobility, Mobility, Flexibility, Strength, Coordination, FMC, Pain, UE functional use       Visit Diagnosis: Stiffness of left hand, not elsewhere classified  Muscle weakness (generalized)  Localized edema  Stiffness of finger joint, left  Pain in joint of left hand  Pain in left hand    Problem List Patient Active Problem List   Diagnosis Date Noted  . Chronic otitis externa of right ear 07/27/2019  . Dysfunction of right eustachian tube 07/27/2019  . Chronic mycotic otitis externa 07/13/2019  . Dizziness 10/13/2018  . History of mastoiditis 10/13/2018  . Vasculitis of skin 08/22/2018  . Cervical radiculopathy 06/13/2018  . GAD (generalized anxiety disorder) 03/09/2018  . Mixed conductive and sensorineural hearing loss of both ears 09/16/2017  .  Chronic mastoiditis of left side 05/27/2017  . Fibromyalgia 06/19/2016  . Spondylosis of cervical joint 02/21/2016  . Depression 04/09/2015  . Gastritis 04/09/2015  . Uncomplicated alcohol dependence ([place]) 04/08/2015  . Hypertension 08/27/2014  . Allodynia 05/17/2013  . Chest pain syndrome 04/28/2013  . Mild HOCM (hypertrophic obstructive cardiomyopathy) ([place]) 04/28/2013  . Palpitations 04/28/2013    Hung Rhinesmith 01/10/2020, 6:19 PM  [facility] [address] [place], [place], [address] Phone: [phone]   Fax:  [phone]  Name: JANAYA BROY MRN: [id number] Date of Birth: September 29, 1960

## 2020-01-12 ENCOUNTER — Other Ambulatory Visit: Payer: Self-pay | Admitting: Family Medicine

## 2020-01-12 ENCOUNTER — Other Ambulatory Visit: Payer: Self-pay | Admitting: Neurological Surgery

## 2020-01-12 MED ORDER — HYDROCODONE-ACETAMINOPHEN 7.5-325 MG PO TABS: 1.0000 | ORAL_TABLET | Freq: Three times a day (TID) | ORAL | 0 refills | Status: DC | PRN

## 2020-01-12 NOTE — Telephone Encounter (Signed)
2 days prior hold Xarelto.

## 2020-01-12 NOTE — Telephone Encounter (Signed)
Caller: Desirey Call back phone number: [phone]  Patient is calling back requesting an update on her medication refill.   Please advise.

## 2020-01-12 NOTE — Telephone Encounter (Signed)
Patient is needing pain medication  Patient is calling to check status, please advise

## 2020-01-12 NOTE — Telephone Encounter (Signed)
Patient is having neck surgery on June 10,2021, patient states she would like to know how many day before surgery should she stop taking rivaroxaban (XARELTO) 20 MG TABS tablet [[id number]]

## 2020-01-12 NOTE — Telephone Encounter (Signed)
Medication: HYDROcodone-acetaminophen (NORCO) 7.5-325 MG tablet [[id number]]   Has the patient contacted their pharmacy? No. (If no, request that the patient contact the pharmacy for the refill.) (If yes, when and what did the pharmacy advise?)  Preferred Pharmacy (with phone number or street name): [id number] - [place], [place] - [address]  [address] [place] [address]  Phone:  [phone] Fax:  [phone]  DEA #:  [id number]  Agent: Please be advised that RX refills may take up to 3 business days. We ask that you follow-up with your pharmacy.

## 2020-01-15 ENCOUNTER — Encounter: Payer: Self-pay | Admitting: Occupational Therapy

## 2020-01-15 ENCOUNTER — Other Ambulatory Visit: Payer: Self-pay

## 2020-01-15 ENCOUNTER — Telehealth: Payer: Self-pay

## 2020-01-15 ENCOUNTER — Ambulatory Visit: Payer: BLUE CROSS/BLUE SHIELD | Admitting: Occupational Therapy

## 2020-01-15 DIAGNOSIS — M25531 Pain in right wrist: Secondary | ICD-10-CM | POA: Diagnosis not present

## 2020-01-15 DIAGNOSIS — M6281 Muscle weakness (generalized): Secondary | ICD-10-CM

## 2020-01-15 DIAGNOSIS — M25542 Pain in joints of left hand: Secondary | ICD-10-CM | POA: Diagnosis not present

## 2020-01-15 DIAGNOSIS — M79642 Pain in left hand: Secondary | ICD-10-CM | POA: Diagnosis not present

## 2020-01-15 DIAGNOSIS — R600 Localized edema: Secondary | ICD-10-CM | POA: Diagnosis not present

## 2020-01-15 DIAGNOSIS — M25642 Stiffness of left hand, not elsewhere classified: Secondary | ICD-10-CM | POA: Diagnosis not present

## 2020-01-15 NOTE — Telephone Encounter (Signed)
Patient informed refill was taken care of on Friday 01/12/2020

## 2020-01-15 NOTE — Therapy (Signed)
[facility] [address] [place], [place], [address] Phone: [phone]   Fax:  [phone]  Occupational Therapy Treatment  Patient Details  Name: [name] MRN: [id number] Date of Birth: 11/23/1960 Referring Provider (OT): Dr Claudia Desanctis   Encounter Date: 01/15/2020  OT End of Session - 01/15/20 1746    Visit Number  5    Number of Visits  14    Date for OT Re-Evaluation  01/24/20    Authorization Type  [employer]    OT Start Time  1700    OT Stop Time  1744    OT Time Calculation (min)  44 min    Activity Tolerance  Patient tolerated treatment well    Behavior During Therapy  [facility] for tasks assessed/performed       Past Medical History:  Diagnosis Date  . Back injury    Chronic  . Bipolar disorder ([place])   . Cardiomyopathy ([place])    hypertension controlled-helped with edema-Dr Nahser  . Deafness    Left ear  . Degenerative joint disease    with neck surgery  . Depression   . Diastolic dysfunction    with possible mild LVOT gradient  . Family history of adverse reaction to anesthesia    " my daughter has PONV and gets very grumpy"  . Fibromyalgia   . Former cigarette smoker   . Gestational diabetes   . H/O blood clots    due to side effect of medication  . Headache(784.0)    migraines  . Hypertension   . Hypothyroidism   . Leg cramps   . Neuropathy   . Pain    chronic   . Pneumonia   . PTSD (post-traumatic stress disorder)   . Respiratory disorder   . Schizophrenia [facility])     Past Surgical History:  Procedure Laterality Date  . ABDOMINAL HYSTERECTOMY     partial  . CARDIAC CATHETERIZATION     Ejection Fraction 65-70%  . CERVICAL DISC ARTHROPLASTY N/A 02/21/2016   Procedure: Cervical three-four artificial disc replacement;  Surgeon: Kristeen Miss, MD;  Location: [place] [facility];  Service: Neurosurgery;  Laterality: N/A;  . CERVICAL FUSION     C5-6  . EXTERNAL EAR SURGERY     tumor at age 51, left  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  . HERNIA REPAIR     " as a baby"  . NECK SURGERY    . OTHER SURGICAL HISTORY     History of cervical and lumbar disk surgery  . PERCUTANEOUS PINNING Left 12/01/2019   Procedure: Closed reduction percutaneous pinning left ring finger proximal phalanx fracture;  Surgeon: Cindra Presume, MD;  Location: [facility];  Service: Plastics;  Laterality: Left;  . polypectomies     tubes  . POSTERIOR CERVICAL LAMINECTOMY WITH MET- RX Left 06/13/2018   Procedure: Left Cervical three-four Laminotomy/foraminotomy;  Surgeon: Kristeen Miss, MD;  Location: [facility];  Service: Neurosurgery;  Laterality: Left;  . TONSILLECTOMY AND ADENOIDECTOMY      There were no vitals filed for this visit.  Subjective Assessment - 01/15/20 1703    Subjective   Patient reports pain 8/10 in left hand - ring finger - feels she may have overdone it, lifting a pot    Currently in Pain?  Yes    Pain Score  8     Pain Location  Hand    Pain Orientation  Left    Pain Descriptors / Indicators  Aching  Pain Type  Acute pain    Pain Onset  More than a month ago    Pain Frequency  Intermittent    Aggravating Factors   movement    Pain Relieving Factors  heat    Multiple Pain Sites  No                   OT Treatments/Exercises (OP) - 01/15/20 0001      Exercises   Exercises  Hand      Hand Exercises   Other Hand Exercises  Reviewed HEP , patient having difficulty with DIP blocking exercises, added gentle passive stretch over top of left hand with roght hand  Patient able to return demonstrate      Modalities   Modalities  Fluidotherapy      Ultrasound   Ultrasound Location  Left hand    Ultrasound Parameters  32mz, 20%, 0.8w/cm2, 8 min    Ultrasound Goals  Pain;Edema      LUE Fluidotherapy   Number Minutes Fluidotherapy  12 Minutes    LUE Fluidotherapy Location  Hand    Comments  pain relief               OT Short Term Goals - 01/04/20 1749      OT SHORT TERM GOAL #1   Title   Pt will be Mod I splinting use, care and precautions left hand.    Baseline  Min VC's required    Time  4    Period  Weeks    Status  Achieved    Target Date  01/10/20      OT SHORT TERM GOAL #2   Title  Pt will be Mod I edema control left hand    Baseline  Ocassional vc's required    Time  4    Period  Weeks    Status  On-going    Target Date  01/10/20      OT SHORT TERM GOAL #3   Title  Pt will be Mod I HEP left hand    Baseline  dependent    Time  4    Period  Weeks    Status  Achieved    Target Date  01/10/20        OT Long Term Goals - 12/13/19 1152      OT LONG TERM GOAL #1   Title  Pt will be Mod I scar management L hand    Baseline  dependent    Time  8    Period  Weeks    Status  New    Target Date  02/07/20      OT LONG TERM GOAL #2   Title  Pt will be Mod I HEP L hand    Baseline  dependent    Time  8    Period  Weeks    Status  New    Target Date  02/07/20      OT LONG TERM GOAL #3   Title  Pt will report pain in left hand as 3/10 or less during light ADL activities using left hand as non-dominant hand    Baseline  dependent    Time  8    Period  Weeks    Status  New    Target Date  02/07/20      OT LONG TERM GOAL #4   Title  Pt will demonstrate active range of motion within functional limits as seen by ability  to to perform active finger flexion of ring and small fingers to within 2 cm of distal palmar crease [facility]).    Baseline  Unable    Time  8    Period  Weeks    Status  New    Target Date  02/07/20      OT LONG TERM GOAL #5   Title  Pt will demonstrate light grip strength of L non-dominant hand of 5# or greater as seen by JAMAR assessment    Baseline  unable    Time  8    Period  Weeks    Status  New    Target Date  02/07/20            Plan - 01/15/20 1747    Clinical Impression Statement  Patient with increased pain this sessin due to overuse at home    OT Frequency  1x / week    OT Treatment/Interventions  Self-care/ADL  training;Fluidtherapy;Splinting;Therapeutic activities;Therapeutic exercise;Scar mobilization;Passive range of motion;Paraffin;Manual Therapy;Patient/family education;Moist Heat    Plan  continue [place], A/ROM, P/ROM, splint now d/c    Consulted and Agree with Plan of Care  Patient       Patient will benefit from skilled therapeutic intervention in order to improve the following deficits and impairments:           Visit Diagnosis: Stiffness of left hand, not elsewhere classified  Muscle weakness (generalized)  Localized edema  Stiffness of finger joint, left  Pain in joint of left hand  Pain in left hand  Pain in right wrist    Problem List Patient Active Problem List   Diagnosis Date Noted  . Chronic otitis externa of right ear 07/27/2019  . Dysfunction of right eustachian tube 07/27/2019  . Chronic mycotic otitis externa 07/13/2019  . Dizziness 10/13/2018  . History of mastoiditis 10/13/2018  . Vasculitis of skin 08/22/2018  . Cervical radiculopathy 06/13/2018  . GAD (generalized anxiety disorder) 03/09/2018  . Mixed conductive and sensorineural hearing loss of both ears 09/16/2017  . Chronic mastoiditis of left side 05/27/2017  . Fibromyalgia 06/19/2016  . Spondylosis of cervical joint 02/21/2016  . Depression 04/09/2015  . Gastritis 04/09/2015  . Uncomplicated alcohol dependence ([place]) 04/08/2015  . Hypertension 08/27/2014  . Allodynia 05/17/2013  . Chest pain syndrome 04/28/2013  . Mild HOCM (hypertrophic obstructive cardiomyopathy) ([place]) 04/28/2013  . Palpitations 04/28/2013    Mariah Milling, OTR/L 01/15/2020, 6:54 PM  [facility] [address] [place], [place], [address] Phone: [phone]   Fax:  [phone]  Name: KATRINNA TRAVIESO MRN: [id number] Date of Birth: 1961-03-24

## 2020-01-15 NOTE — Telephone Encounter (Signed)
Called left message to call back 

## 2020-01-15 NOTE — Telephone Encounter (Signed)
Patient called in to get a refill on the following medication HYDROcodone-acetaminophen (NORCO) 7.5-325 MG tablet [[id number]]    Please send it to [id number] - [place], [place] - [address]  [address] [place] [address]  Phone:  [phone] Fax:  [phone]  DEA #:  [id number]

## 2020-01-15 NOTE — Telephone Encounter (Signed)
Called informed of Xarelto instructions and rx refilled

## 2020-01-17 ENCOUNTER — Telehealth: Payer: Self-pay | Admitting: Family Medicine

## 2020-01-17 NOTE — Telephone Encounter (Signed)
Called the patients cell/home number left message to call back to schedule Surgical Clearance appointment.

## 2020-01-18 ENCOUNTER — Encounter: Payer: Self-pay | Admitting: Family Medicine

## 2020-01-19 ENCOUNTER — Other Ambulatory Visit: Payer: Self-pay

## 2020-01-19 ENCOUNTER — Ambulatory Visit: Payer: BLUE CROSS/BLUE SHIELD | Admitting: Family Medicine

## 2020-01-19 ENCOUNTER — Encounter: Payer: Self-pay | Admitting: Family Medicine

## 2020-01-19 VITALS — BP 120/80 | HR 73 | Temp 96.0°F | Ht 65.0 in | Wt 149.0 lb

## 2020-01-19 DIAGNOSIS — Z01818 Encounter for other preprocedural examination: Secondary | ICD-10-CM | POA: Diagnosis not present

## 2020-01-19 NOTE — Telephone Encounter (Signed)
appt scheduled

## 2020-01-19 NOTE — Patient Instructions (Addendum)
Stop the Xarelto 2 days prior to surgery. Your last dose is on 6/5 for a surgery on 6/8.   Give [place] 2-3 business days to get the results of your labs back.    Let us know if you need anything.

## 2020-01-19 NOTE — Progress Notes (Signed)
Subjective:   Chief Complaint  Patient presents with  . Medical Clearance    [name]  is here for a Pre-operative physical at the request of Dr. Ellene Route.   She  is having c-spine surgery on 6/8 for a revision.  Personal hx of adverse outcome to anesthesia? No  Chipped, cracked, missing, or loose teeth? ; similar as before with lower incisor and upper b/l molars Decreased ROM of neck? nothing new Able to walk up 2 flights of stairs without becoming significantly short of breath or having chest pain? Yes   Revised Goldman Criteria: High Risk Surgery (intraperitoneal, intrathoracic, aortic): No  Ischemic heart disease (Prior MI, +excercise stress test, angina, nitrate use, Qwave): No  History of heart failure: No  History of cerebrovascular disease: No  History of diabetes: No  Insulin therapy for DM: No  Preoperative Cr >2.0: No   Patient Active Problem List   Diagnosis Date Noted  . Chronic otitis externa of right ear 07/27/2019  . Dysfunction of right eustachian tube 07/27/2019  . Chronic mycotic otitis externa 07/13/2019  . Dizziness 10/13/2018  . History of mastoiditis 10/13/2018  . Vasculitis of skin 08/22/2018  . Cervical radiculopathy 06/13/2018  . GAD (generalized anxiety disorder) 03/09/2018  . Mixed conductive and sensorineural hearing loss of both ears 09/16/2017  . Chronic mastoiditis of left side 05/27/2017  . Fibromyalgia 06/19/2016  . Spondylosis of cervical joint 02/21/2016  . Depression 04/09/2015  . Gastritis 04/09/2015  . Uncomplicated alcohol dependence ([place]) 04/08/2015  . Hypertension 08/27/2014  . Allodynia 05/17/2013  . Chest pain syndrome 04/28/2013  . Mild HOCM (hypertrophic obstructive cardiomyopathy) ([place]) 04/28/2013  . Palpitations 04/28/2013   Past Medical History:  Diagnosis Date  . Back injury    Chronic  . Bipolar disorder ([place])   . Cardiomyopathy ([place])    hypertension controlled-helped with edema-Dr Nahser  . Deafness    Left ear   . Degenerative joint disease    with neck surgery  . Depression   . Diastolic dysfunction    with possible mild LVOT gradient  . Family history of adverse reaction to anesthesia    " my daughter has PONV and gets very grumpy"  . Fibromyalgia   . Former cigarette smoker   . Gestational diabetes   . H/O blood clots    due to side effect of medication  . Headache(784.0)    migraines  . Hypertension   . Hypothyroidism   . Leg cramps   . Neuropathy   . Pain    chronic   . Pneumonia   . PTSD (post-traumatic stress disorder)   . Respiratory disorder   . Schizophrenia [facility])     Past Surgical History:  Procedure Laterality Date  . ABDOMINAL HYSTERECTOMY     partial  . CARDIAC CATHETERIZATION     Ejection Fraction 65-70%  . CERVICAL DISC ARTHROPLASTY N/A 02/21/2016   Procedure: Cervical three-four artificial disc replacement;  Surgeon: Kristeen Miss, MD;  Location: [place] [facility];  Service: Neurosurgery;  Laterality: N/A;  . CERVICAL FUSION     C5-6  . EXTERNAL EAR SURGERY     tumor at age 63, 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  . HERNIA REPAIR     " as a baby"  . NECK SURGERY    . OTHER SURGICAL HISTORY     History of cervical and lumbar disk surgery  . PERCUTANEOUS PINNING Left 12/01/2019   Procedure: Closed reduction percutaneous pinning left ring finger proximal phalanx fracture;  Surgeon:  Cindra Presume, MD;  Location: [facility];  Service: Plastics;  Laterality: Left;  . polypectomies     tubes  . POSTERIOR CERVICAL LAMINECTOMY WITH MET- RX Left 06/13/2018   Procedure: Left Cervical three-four Laminotomy/foraminotomy;  Surgeon: Kristeen Miss, MD;  Location: [place];  Service: Neurosurgery;  Laterality: Left;  . TONSILLECTOMY AND ADENOIDECTOMY      Current Outpatient Medications  Medication Sig Dispense Refill  . acetaminophen (TYLENOL) 500 MG tablet Take 1,000 mg by mouth every 6 (six) hours as needed for mild pain or headache.     . ALPRAZolam (XANAX) 1 MG tablet Take 1 tablet (1 mg total)  by mouth 2 (two) times daily as needed for anxiety. 60 tablet 2  . bisacodyl (DULCOLAX) 5 MG EC tablet Take 5 mg by mouth 2 (two) times daily as needed for moderate constipation.     . busPIRone (BUSPAR) 30 MG tablet Take 0.5 tablets (15 mg total) by mouth 2 (two) times daily. 180 tablet 1  . citalopram (CELEXA) 20 MG tablet Take 1 tablet (20 mg total) by mouth daily. 90 tablet 1  . hydrochlorothiazide (HYDRODIURIL) 25 MG tablet TAKE 1 TABLET(25 MG) BY MOUTH DAILY (Patient taking differently: Take 25 mg by mouth daily. ) 90 tablet 3  . HYDROcodone-acetaminophen (NORCO) 7.5-325 MG tablet Take 1 tablet by mouth every 8 (eight) hours as needed for moderate pain. 60 tablet 0  . hydrOXYzine (ATARAX/VISTARIL) 25 MG tablet TAKE 3 TO 4 TABLETS BY MOUTH EVERY 8 HOURS AS NEEDED FOR ANXIETY. MAX AMOUNT PER INSURANCE (Patient taking differently: Take 75-100 mg by mouth every 8 (eight) hours as needed for anxiety. TAKE 3 TO 4 TABLETS BY MOUTH EVERY 8 HOURS AS NEEDED FOR ANXIETY. MAX AMOUNT PER INSURANCE) 360 tablet 2  . levothyroxine (SYNTHROID) 50 MCG tablet TAKE 1 TABLET BY MOUTH EVERY DAY (Patient taking differently: Take 50 mcg by mouth daily. ) 90 tablet 0  . loratadine (CLARITIN) 10 MG tablet Take 10 mg by mouth 2 (two) times daily as needed (for seasonal allergies).     . meclizine (ANTIVERT) 25 MG tablet Take 1 tablet (25 mg total) by mouth 3 (three) times daily as needed for dizziness. 30 tablet 3  . methocarbamol (ROBAXIN) 500 MG tablet Take 1 tablet (500 mg total) by mouth every 8 (eight) hours as needed for muscle spasms. 90 tablet 2  . metoprolol tartrate (LOPRESSOR) 25 MG tablet Take 1 tablet (25 mg total) by mouth 2 (two) times daily. 180 tablet 3  . naproxen (NAPROSYN) 500 MG tablet TAKE 1 TAB DAILY AS NEEDED FOR PAIN. USE SPARINGLY. (Patient taking differently: Take 500 mg by mouth daily as needed for mild pain. ) 30 tablet 1  . risperiDONE (RISPERDAL) 1 MG tablet Take one tablet at bedtime.  (Patient taking differently: Take 1 mg by mouth at bedtime. ) 90 tablet 1  . rivaroxaban (XARELTO) 20 MG TABS tablet Take 1 tablet (20 mg total) by mouth daily with supper. (Patient taking differently: Take 20 mg by mouth daily. ) 90 tablet 2   Allergies  Allergen Reactions  . Morphine And Related Other (See Comments)    "Didn't agree with me/wasn't very effective.."  . Lyrica [Pregabalin] Rash  . Neurontin [Gabapentin] Other (See Comments)    Left patient sleepy all the time    Family History  Problem Relation Age of Onset  . Coronary artery disease Father   . Parkinson's disease Mother   . Cancer Sister  breast  . Diabetes Sister   . Diabetes Brother      Review of Systems:  Constitutional:  no fevers Eye:  no recent significant change in vision Ear:  no hearing loss Nose/Mouth/Throat:  No new dental complaints Neck/Thyroid:  no lumps or masses Pulmonary:  No shortness of breath Cardiovascular:  no chest pain Gastrointestinal:  no abdominal pain GU:  negative for dysuria Musculoskeletal/Extremities:  +neck pain Skin/Integumentary ROS:  no abnormal skin lesions reported Neurologic:  no HA   Objective:   Vitals:   01/19/20 1436  BP: 120/80  Pulse: 73  Temp: (!) 96 F (35.6 C)  TempSrc: Temporal  SpO2: 97%  Weight: 149 lb (67.6 kg)  Height: _0  (1.651 m)   Body mass index is 24.79 kg/m.  General:  well developed, well nourished, in no apparent distress Skin:  warm, no pallor or diaphoresis Head:  normocephalic, atraumatic Eyes:  pupils equal and round, sclera anicteric without injection Ears:  canals without lesions, TMs shiny without retraction, no obvious effusion, no erythema Throat/Pharynx:  lips and gingiva without lesion; tongue and uvula midline; non-inflamed pharynx; no exudates or postnasal drainage Neck: neck supple without adenopathy, thyromegaly, or masses, no bruits, no jugular venous distention Lungs:  clear to auscultation, breath  sounds equal bilaterally, no respiratory distress Cardio:  regular rate and rhythm without murmurs Abdomen:  abdomen soft, nontender; bowel sounds normal; no masses, hepatomegaly or splenomegaly Musculoskeletal:  symmetrical muscle groups noted without atrophy or deformity Extremities:  no clubbing, cyanosis, or edema, no deformities, no skin discoloration Neuro:  gait normal; deep tendon reflexes normal and symmetric and alert and oriented to person, place, and time Psych: Age appropriate judgment and insight; normal mood   Assessment:   Pre-op exam - Plan: Basic metabolic panel   Plan:   Hold Xarelto 2 days prior to procedure.  Check BMP given history of hypertension.  She should not need to hold any other medications perioperatively. Pending the above workup, the patient is deemed low cardiac risk for the proposed procedure.  The patient voiced understanding and agreement to the plan.  Patrick, DO 01/19/20  3:34 PM

## 2020-01-20 ENCOUNTER — Telehealth: Payer: Self-pay | Admitting: Family Medicine

## 2020-01-20 LAB — BASIC METABOLIC PANEL
BUN: 10 mg/dL (ref 7–25)
CO2: 21 mmol/L (ref 20–32)
Calcium: 9.2 mg/dL (ref 8.6–10.4)
Chloride: 91 mmol/L — ABNORMAL LOW (ref 98–110)
Creat: 0.85 mg/dL (ref 0.50–1.05)
Glucose, Bld: 104 mg/dL — ABNORMAL HIGH (ref 65–99)
Potassium: 3.4 mmol/L — ABNORMAL LOW (ref 3.5–5.3)
Sodium: 125 mmol/L — ABNORMAL LOW (ref 135–146)

## 2020-01-20 NOTE — Telephone Encounter (Signed)
I left a VM w pt regarding her Na. Message to stop HCTZ and reck Mon. ER if starting to feel awful. If on-call doc paged, please contact me via my cell. It won't be urgent/emergent to manage.

## 2020-01-23 ENCOUNTER — Other Ambulatory Visit (INDEPENDENT_AMBULATORY_CARE_PROVIDER_SITE_OTHER): Payer: BLUE CROSS/BLUE SHIELD

## 2020-01-23 ENCOUNTER — Other Ambulatory Visit: Payer: Self-pay | Admitting: Family Medicine

## 2020-01-23 ENCOUNTER — Ambulatory Visit: Payer: BLUE CROSS/BLUE SHIELD | Admitting: Adult Health

## 2020-01-23 ENCOUNTER — Telehealth: Payer: Self-pay

## 2020-01-23 DIAGNOSIS — E871 Hypo-osmolality and hyponatremia: Secondary | ICD-10-CM

## 2020-01-23 DIAGNOSIS — E876 Hypokalemia: Secondary | ICD-10-CM | POA: Diagnosis not present

## 2020-01-23 LAB — BASIC METABOLIC PANEL
BUN: 24 mg/dL — ABNORMAL HIGH (ref 6–23)
CO2: 31 mEq/L (ref 19–32)
Calcium: 8.7 mg/dL (ref 8.4–10.5)
Chloride: 99 mEq/L (ref 96–112)
Creatinine, Ser: 0.92 mg/dL (ref 0.40–1.20)
GFR: 62.86 mL/min (ref >60.00)
Glucose, Bld: 92 mg/dL (ref 70–99)
Potassium: 4.3 mEq/L (ref 3.5–5.1)
Sodium: 136 mEq/L (ref 135–145)

## 2020-01-23 NOTE — Telephone Encounter (Signed)
After Hours Call:  Caller states she missed a call from the dr on saturday about her blood work. She was advised that her sodium levels are off and she needs to come in today because she has surgery this week.

## 2020-01-23 NOTE — Telephone Encounter (Signed)
PCP has spoken to the patient this weekend.. Also I have called her this morning/she will go to [facility] lab to have lab work done

## 2020-01-24 ENCOUNTER — Ambulatory Visit: Payer: BLUE CROSS/BLUE SHIELD | Admitting: Plastic Surgery

## 2020-01-24 ENCOUNTER — Ambulatory Visit: Payer: BLUE CROSS/BLUE SHIELD | Admitting: Occupational Therapy

## 2020-01-25 ENCOUNTER — Ambulatory Visit: Payer: BLUE CROSS/BLUE SHIELD | Admitting: Plastic Surgery

## 2020-01-25 NOTE — Pre-Procedure Instructions (Addendum)
Your procedure is scheduled on January 30, 2020 from 11:34 AM- 1:08 PM.  Report to [facility] Main Entrance "A" at 09:30 A.M., and check in at the Admitting office.  Call this number if you have problems the morning of surgery:  [phone]  Call [phone] if you have any questions prior to your surgery date Monday-Friday 8am-4pm.    Remember:  Do not eat or drink after midnight the night before your surgery.     Take these medicines the morning of surgery with A SIP OF WATER: busPIRone (BUSPAR) citalopram (CELEXA) levothyroxine (SYNTHROID) metoprolol tartrate (LOPRESSOR)  IF NEEDED: acetaminophen (TYLENOL) ALPRAZolam (XANAX) HYDROcodone-acetaminophen (NORCO) hydrOXYzine (ATARAX/VISTARIL) loratadine (CLARITIN) meclizine (ANTIVERT) methocarbamol (ROBAXIN)   Stop rivaroxaban (XARELTO) 2 days prior to surgery.  As of today, STOP taking any Aspirin (unless otherwise instructed by your surgeon) and Aspirin containing products, Aleve, Naproxen, Ibuprofen, Motrin, Advil, Goody's, BC's, all herbal medications, fish oil, and all vitamins.          The Morning of Surgery:            Do not wear jewelry, make up, or nail polish.            Do not wear lotions, powders, perfumes, or deodorant.            Do not shave 48 hours prior to surgery.             Do not bring valuables to the hospital.            [facility] is not responsible for any belongings or valuables.  Do NOT Smoke (Tobacco/Vapping) or drink Alcohol 24 hours prior to your procedure.  If you use a CPAP at night, you may bring all equipment for your overnight stay.   Contacts, glasses, dentures or bridgework may not be worn into surgery.      For patients admitted to the hospital, discharge time will be determined by your treatment team.   Patients discharged the day of surgery will not be allowed to drive home, and someone needs to stay with them for 24 hours.    Special instructions:   [place]-  Preparing For Surgery  Before surgery, you can play an important role. Because skin is not sterile, your skin needs to be as free of germs as possible. You can reduce the number of germs on your skin by washing with CHG (chlorahexidine gluconate) Soap before surgery.  CHG is an antiseptic cleaner which kills germs and bonds with the skin to continue killing germs even after washing.    Oral Hygiene is also important to reduce your risk of infection.  Remember - BRUSH YOUR TEETH THE MORNING OF SURGERY WITH YOUR REGULAR TOOTHPASTE  Please do not use if you have an allergy to CHG or antibacterial soaps. If your skin becomes reddened/irritated stop using the CHG.  Do not shave (including legs and underarms) for at least 48 hours prior to first CHG shower. It is OK to shave your face.  Please follow these instructions carefully.   1. Shower the NIGHT BEFORE SURGERY and the MORNING OF SURGERY with CHG Soap.   2. If you chose to wash your hair, wash your hair first as usual with your normal shampoo.  3. After you shampoo, rinse your hair and body thoroughly to remove the shampoo.  4. Use CHG as you would any other liquid soap. You can apply CHG directly to the skin and wash gently with a scrungie  or a clean washcloth.   5. Apply the CHG Soap to your body ONLY FROM THE NECK DOWN.  Do not use on open wounds or open sores. Avoid contact with your eyes, ears, mouth and genitals (private parts). Wash Face and genitals (private parts)  with your normal soap.   6. Wash thoroughly, paying special attention to the area where your surgery will be performed.  7. Thoroughly rinse your body with warm water from the neck down.  8. DO NOT shower/wash with your normal soap after using and rinsing off the CHG Soap.  9. Pat yourself dry with a CLEAN TOWEL.  10. Wear CLEAN PAJAMAS to bed the night before surgery, wear comfortable clothes the morning of surgery  11. Place CLEAN SHEETS on your bed the night of  your first shower and DO NOT SLEEP WITH PETS.   Day of Surgery: Shower with CHG Soap.  Do not apply any deodorants/lotions.  Please wear clean clothes to the hospital/surgery center.   Remember to brush your teeth WITH YOUR REGULAR TOOTHPASTE.   Please read over the following fact sheets that you were given.

## 2020-01-26 ENCOUNTER — Encounter (HOSPITAL_COMMUNITY): Payer: Self-pay

## 2020-01-26 ENCOUNTER — Other Ambulatory Visit: Payer: Self-pay

## 2020-01-26 ENCOUNTER — Ambulatory Visit: Payer: BLUE CROSS/BLUE SHIELD | Admitting: Occupational Therapy

## 2020-01-26 ENCOUNTER — Encounter (HOSPITAL_COMMUNITY)
Admission: RE | Admit: 2020-01-26 | Discharge: 2020-01-26 | Disposition: A | Discharge status: Home / Self Care | Payer: BLUE CROSS/BLUE SHIELD | Source: Ambulatory Visit | Attending: Neurological Surgery | Admitting: Neurological Surgery

## 2020-01-26 ENCOUNTER — Other Ambulatory Visit (HOSPITAL_COMMUNITY)
Admission: RE | Admit: 2020-01-26 | Discharge: 2020-01-26 | Disposition: A | Discharge status: Home / Self Care | Payer: BLUE CROSS/BLUE SHIELD | Source: Ambulatory Visit | Attending: Neurological Surgery | Admitting: Neurological Surgery

## 2020-01-26 DIAGNOSIS — Z20822 Contact with and (suspected) exposure to covid-19: Secondary | ICD-10-CM | POA: Diagnosis not present

## 2020-01-26 DIAGNOSIS — Z01812 Encounter for preprocedural laboratory examination: Secondary | ICD-10-CM | POA: Diagnosis not present

## 2020-01-26 HISTORY — DX: Dyspnea, unspecified: R06.00

## 2020-01-26 LAB — BASIC METABOLIC PANEL
Anion gap: 10 (ref 5–15)
BUN: 13 mg/dL (ref 6–20)
CO2: 26 mmol/L (ref 22–32)
Calcium: 8.8 mg/dL — ABNORMAL LOW (ref 8.9–10.3)
Chloride: 101 mmol/L (ref 98–111)
Creatinine, Ser: 0.73 mg/dL (ref 0.44–1.00)
GFR calc Af Amer: >60 mL/min (ref >60)
GFR calc non Af Amer: >60 mL/min (ref >60)
Glucose, Bld: 107 mg/dL — ABNORMAL HIGH (ref 70–99)
Potassium: 4.1 mmol/L (ref 3.5–5.1)
Sodium: 137 mmol/L (ref 135–145)

## 2020-01-26 LAB — TYPE AND SCREEN
ABO/RH(D): A NEG
Antibody Screen: NEGATIVE

## 2020-01-26 LAB — CBC
HCT: 40.7 % (ref 36.0–46.0)
Hemoglobin: 13.6 g/dL (ref 12.0–15.0)
MCH: 31.9 pg (ref 26.0–34.0)
MCHC: 33.4 g/dL (ref 30.0–36.0)
MCV: 95.3 fL (ref 80.0–100.0)
Platelets: 354 10*3/uL (ref 150–400)
RBC: 4.27 MIL/uL (ref 3.87–5.11)
RDW: 12.9 % (ref 11.5–15.5)
WBC: 8.9 10*3/uL (ref 4.0–10.5)
nRBC: 0 % (ref 0.0–0.2)

## 2020-01-26 LAB — SURGICAL PCR SCREEN
MRSA, PCR: NEGATIVE
Staphylococcus aureus: NEGATIVE

## 2020-01-26 LAB — SARS CORONAVIRUS 2 (TAT 6-24 HRS): SARS Coronavirus 2: NEGATIVE

## 2020-01-26 LAB — ABO/RH: ABO/RH(D): A NEG

## 2020-01-26 NOTE — Progress Notes (Signed)
PCP - Sharlene Dory, MD Cardiologist - Kristeen Miss, MD  PPM/ICD - Denies  Chest x-ray - N/A EKG - 05/16/19 Stress Test - Denies ECHO - 05/17/19 Cardiac Cath - 06/13/09  Sleep Study - Denies  Patient denies being a diabetic.  Blood Thinner Instructions: Per patient, stop rivaroxaban (XARELTO) 3 days prior to surgery.  Aspirin Instructions: N/A  ERAS Protcol - N/A PRE-SURGERY Ensure or G2- N/A  COVID TEST- 01/26/20 @ [facility]   Anesthesia review: Yes, cardiac hx.  Patient denies shortness of breath, fever, cough and chest pain at PAT appointment   All instructions explained to the patient, with a verbal understanding of the material. Patient agrees to go over the instructions while at home for a better understanding. Patient also instructed to self quarantine after being tested for COVID-19. The opportunity to ask questions was provided.

## 2020-01-29 ENCOUNTER — Encounter: Payer: BLUE CROSS/BLUE SHIELD | Admitting: Occupational Therapy

## 2020-01-29 NOTE — Progress Notes (Signed)
Anesthesia Chart Review:  Seen and cleared by PCP Dr. Carmelia Roller 01/19/20, "Hold Xarelto 2 days prior to procedure.  Check BMP given history of hypertension.  She should not need to hold any other medications perioperatively. Pending the above workup, the patient is deemed low cardiac risk for the proposed procedure." BMP ordered that day showed hyponatremia with sodium 125. Dr. Carmelia Roller advised pt to hold HCTZ. On recheck sodium was 136. Pt was advised to hold HCTZ until after surgery.  Follows with cardiology for hx of palpitations, HTN. Last seen by Dr. Elease Hashimoto 05/29/19. No palpitations at that time. No changes to management.   On Xarelto for hx of LLE DVT.  Hx of left  Cervical 3-4 laminotomy/foraminotomy 2019, C3-4 artifical disc replacement 2017.  Proep labs reviewed, unremarkable.   Last EKG by PCP 05/16/19 showed t wave inversions. Dr. Carmelia Roller commented: "EKG - NSR, there are some new T wave inversions compared to previous tracing; I did discussed this change with a cardiologist.  Will obtain echo and allow for surgery as she is low risk otherwise and totally asymptomatic." Echo showed EF 60-65%, normal valves.  TTE 05/17/19: 1. Left ventricular ejection fraction, by visual estimation, is 60 to  65%. The left ventricle has normal function. Normal left ventricular size.  Left ventricular septal wall thickness was normal. Normal left ventricular  posterior wall thickness. There  is no left ventricular hypertrophy.  2. Global right ventricle has normal systolic function.The right  ventricular size is normal. No increase in right ventricular wall  thickness.  3. Left atrial size was normal.  4. Right atrial size was normal.  5. Presence of pericardial fat pad.  6. The mitral valve is normal in structure. No evidence of mitral valve  regurgitation. No evidence of mitral stenosis.  7. The tricuspid valve is normal in structure. Tricuspid valve  regurgitation was not visualized by color  flow Doppler.  8. The aortic valve is normal in structure. Aortic valve regurgitation  was not visualized by color flow Doppler. Structurally normal aortic  valve, with no evidence of sclerosis or stenosis.  9. The pulmonic valve was normal in structure. Pulmonic valve  regurgitation is not visualized by color flow Doppler.  10. TR signal is inadequate for assessing pulmonary artery systolic  pressure.     Zannie Cove [facility] Short Stay Center/Anesthesiology Phone [phone] 01/29/2020 1:13 PM

## 2020-01-29 NOTE — Anesthesia Preprocedure Evaluation (Addendum)
Anesthesia Evaluation  Patient identified by MRN, date of birth, ID band Patient awake    Reviewed: Allergy & Precautions, NPO status , Patient's Chart, lab work & pertinent test results  Airway Mallampati: II  TM Distance: >3 FB Neck ROM: Full    Dental  (+) Teeth Intact, Dental Advisory Given, Poor Dentition   Pulmonary Current Smoker,    breath sounds clear to auscultation       Cardiovascular hypertension,  Rhythm:Regular Rate:Normal     Neuro/Psych    GI/Hepatic   Endo/Other  diabetes  Renal/GU      Musculoskeletal   Abdominal   Peds  Hematology   Anesthesia Other Findings   Reproductive/Obstetrics                            Anesthesia Physical Anesthesia Plan  ASA: III  Anesthesia Plan: General   Post-op Pain Management:    Induction: Intravenous  PONV Risk Score and Plan: Ondansetron and Dexamethasone  Airway Management Planned: Oral ETT  Additional Equipment:   Intra-op Plan:   Post-operative Plan: Extubation in OR  Informed Consent: I have reviewed the patients History and Physical, chart, labs and discussed the procedure including the risks, benefits and alternatives for the proposed anesthesia with the patient or authorized representative who has indicated his/her understanding and acceptance.     Dental advisory given  Plan Discussed with: CRNA and Anesthesiologist  Anesthesia Plan Comments: (PAT note by Antionette Poles, PA-C: Seen and cleared by PCP Dr. Carmelia Roller 01/19/20, "Hold Xarelto 2 days prior to procedure.  Check BMP given history of hypertension.  She should not need to hold any other medications perioperatively. Pending the above workup, the patient is deemed low cardiac risk for the proposed procedure." BMP ordered that day showed hyponatremia with sodium 125. Dr. Carmelia Roller advised pt to hold HCTZ. On recheck sodium was 136. Pt was advised to hold HCTZ until  after surgery.  Follows with cardiology for hx of palpitations, HTN. Last seen by Dr. Elease Hashimoto 05/29/19. No palpitations at that time. No changes to management.   On Xarelto for hx of LLE DVT.  Hx of left  Cervical 3-4 laminotomy/foraminotomy 2019, C3-4 artifical disc replacement 2017.  Proep labs reviewed, unremarkable.   Last EKG by PCP 05/16/19 showed t wave inversions. Dr. Carmelia Roller commented: "EKG - NSR, there are some new T wave inversions compared to previous tracing; I did discussed this change with a cardiologist.  Will obtain echo and allow for surgery as she is low risk otherwise and totally asymptomatic." Echo showed EF 60-65%, normal valves.  TTE 05/17/19: 1. Left ventricular ejection fraction, by visual estimation, is 60 to  65%. The left ventricle has normal function. Normal left ventricular size.  Left ventricular septal wall thickness was normal. Normal left ventricular  posterior wall thickness. There  is no left ventricular hypertrophy.  2. Global right ventricle has normal systolic function.The right  ventricular size is normal. No increase in right ventricular wall  thickness.  3. Left atrial size was normal.  4. Right atrial size was normal.  5. Presence of pericardial fat pad.  6. The mitral valve is normal in structure. No evidence of mitral valve  regurgitation. No evidence of mitral stenosis.  7. The tricuspid valve is normal in structure. Tricuspid valve  regurgitation was not visualized by color flow Doppler.  8. The aortic valve is normal in structure. Aortic valve regurgitation  was not visualized by  color flow Doppler. Structurally normal aortic  valve, with no evidence of sclerosis or stenosis.  9. The pulmonic valve was normal in structure. Pulmonic valve  regurgitation is not visualized by color flow Doppler.  10. TR signal is inadequate for assessing pulmonary artery systolic  pressure.  )       Anesthesia Quick Evaluation

## 2020-01-30 ENCOUNTER — Ambulatory Visit (HOSPITAL_COMMUNITY): Payer: BLUE CROSS/BLUE SHIELD | Admitting: Certified Registered Nurse Anesthetist

## 2020-01-30 ENCOUNTER — Encounter (HOSPITAL_COMMUNITY)
Admission: RE | Disposition: A | Discharge status: Home / Self Care | Payer: Self-pay | Source: Home / Self Care | Attending: Neurological Surgery

## 2020-01-30 ENCOUNTER — Ambulatory Visit (HOSPITAL_COMMUNITY): Payer: BLUE CROSS/BLUE SHIELD

## 2020-01-30 ENCOUNTER — Other Ambulatory Visit: Payer: Self-pay

## 2020-01-30 ENCOUNTER — Ambulatory Visit (HOSPITAL_COMMUNITY): Payer: BLUE CROSS/BLUE SHIELD | Admitting: Physician Assistant

## 2020-01-30 ENCOUNTER — Observation Stay (HOSPITAL_COMMUNITY)
Admission: RE | Admit: 2020-01-30 | Discharge: 2020-01-30 | Disposition: A | Discharge status: Home / Self Care | Payer: BLUE CROSS/BLUE SHIELD | Attending: Neurological Surgery | Admitting: Neurological Surgery

## 2020-01-30 ENCOUNTER — Encounter (HOSPITAL_COMMUNITY): Payer: Self-pay | Admitting: Neurological Surgery

## 2020-01-30 DIAGNOSIS — Z87891 Personal history of nicotine dependence: Secondary | ICD-10-CM | POA: Insufficient documentation

## 2020-01-30 DIAGNOSIS — M5010 Cervical disc disorder with radiculopathy, unspecified cervical region: Secondary | ICD-10-CM | POA: Diagnosis not present

## 2020-01-30 DIAGNOSIS — Z885 Allergy status to narcotic agent status: Secondary | ICD-10-CM | POA: Insufficient documentation

## 2020-01-30 DIAGNOSIS — Z96698 Presence of other orthopedic joint implants: Secondary | ICD-10-CM | POA: Diagnosis not present

## 2020-01-30 DIAGNOSIS — M4312 Spondylolisthesis, cervical region: Secondary | ICD-10-CM | POA: Diagnosis not present

## 2020-01-30 DIAGNOSIS — M4322 Fusion of spine, cervical region: Secondary | ICD-10-CM | POA: Diagnosis not present

## 2020-01-30 DIAGNOSIS — T84028A Dislocation of other internal joint prosthesis, initial encounter: Principal | ICD-10-CM | POA: Insufficient documentation

## 2020-01-30 DIAGNOSIS — F209 Schizophrenia, unspecified: Secondary | ICD-10-CM | POA: Insufficient documentation

## 2020-01-30 DIAGNOSIS — Z888 Allergy status to other drugs, medicaments and biological substances status: Secondary | ICD-10-CM | POA: Insufficient documentation

## 2020-01-30 DIAGNOSIS — I119 Hypertensive heart disease without heart failure: Secondary | ICD-10-CM | POA: Diagnosis not present

## 2020-01-30 DIAGNOSIS — M5001 Cervical disc disorder with myelopathy,  high cervical region: Secondary | ICD-10-CM | POA: Diagnosis not present

## 2020-01-30 DIAGNOSIS — S13100A Subluxation of unspecified cervical vertebrae, initial encounter: Secondary | ICD-10-CM | POA: Diagnosis present

## 2020-01-30 DIAGNOSIS — Z79899 Other long term (current) drug therapy: Secondary | ICD-10-CM | POA: Insufficient documentation

## 2020-01-30 DIAGNOSIS — Z79890 Hormone replacement therapy (postmenopausal): Secondary | ICD-10-CM | POA: Diagnosis not present

## 2020-01-30 DIAGNOSIS — Z419 Encounter for procedure for purposes other than remedying health state, unspecified: Secondary | ICD-10-CM

## 2020-01-30 DIAGNOSIS — M797 Fibromyalgia: Secondary | ICD-10-CM | POA: Diagnosis not present

## 2020-01-30 DIAGNOSIS — E039 Hypothyroidism, unspecified: Secondary | ICD-10-CM | POA: Insufficient documentation

## 2020-01-30 DIAGNOSIS — G43909 Migraine, unspecified, not intractable, without status migrainosus: Secondary | ICD-10-CM | POA: Diagnosis not present

## 2020-01-30 DIAGNOSIS — F418 Other specified anxiety disorders: Secondary | ICD-10-CM | POA: Diagnosis not present

## 2020-01-30 DIAGNOSIS — Z981 Arthrodesis status: Secondary | ICD-10-CM | POA: Insufficient documentation

## 2020-01-30 DIAGNOSIS — I10 Essential (primary) hypertension: Secondary | ICD-10-CM | POA: Diagnosis not present

## 2020-01-30 DIAGNOSIS — I43 Cardiomyopathy in diseases classified elsewhere: Secondary | ICD-10-CM | POA: Diagnosis not present

## 2020-01-30 DIAGNOSIS — F319 Bipolar disorder, unspecified: Secondary | ICD-10-CM | POA: Diagnosis not present

## 2020-01-30 DIAGNOSIS — X58XXXA Exposure to other specified factors, initial encounter: Secondary | ICD-10-CM | POA: Insufficient documentation

## 2020-01-30 HISTORY — PX: ANTERIOR CERVICAL DECOMP/DISCECTOMY FUSION: SHX1161

## 2020-01-30 LAB — PROTIME-INR
INR: 1 (ref 0.8–1.2)
Prothrombin Time: 12.5 seconds (ref 11.4–15.2)

## 2020-01-30 SURGERY — ANTERIOR CERVICAL DECOMPRESSION/DISCECTOMY FUSION 1 LEVEL
Anesthesia: General | Site: Spine Cervical

## 2020-01-30 MED ORDER — LACTATED RINGERS IV SOLN: INTRAVENOUS | Status: DC

## 2020-01-30 MED ORDER — FENTANYL CITRATE (PF) 250 MCG/5ML IJ SOLN
INTRAMUSCULAR | Status: DC | PRN
  Administered 2020-01-30 (×3): 50 ug via INTRAVENOUS
  Administered 2020-01-30: 100 ug via INTRAVENOUS

## 2020-01-30 MED ORDER — SODIUM CHLORIDE 0.9 % IV SOLN: 250.0000 mL | INTRAVENOUS | Status: DC

## 2020-01-30 MED ORDER — CEFAZOLIN SODIUM-DEXTROSE 2-4 GM/100ML-% IV SOLN
2.0000 g | INTRAVENOUS | Status: AC
  Administered 2020-01-30: 2 g via INTRAVENOUS
  Filled 2020-01-30: qty 100

## 2020-01-30 MED ORDER — PHENOL 1.4 % MT LIQD: 1.0000 | OROMUCOSAL | Status: DC | PRN

## 2020-01-30 MED ORDER — ONDANSETRON HCL 4 MG/2ML IJ SOLN: 4.0000 mg | Freq: Four times a day (QID) | INTRAMUSCULAR | Status: DC | PRN

## 2020-01-30 MED ORDER — OXYCODONE-ACETAMINOPHEN 5-325 MG PO TABS: 1.0000 | ORAL_TABLET | ORAL | 0 refills | Status: DC | PRN

## 2020-01-30 MED ORDER — ONDANSETRON HCL 4 MG/2ML IJ SOLN
INTRAMUSCULAR | Status: AC
  Filled 2020-01-30: qty 2

## 2020-01-30 MED ORDER — DOCUSATE SODIUM 100 MG PO CAPS: 100.0000 mg | ORAL_CAPSULE | Freq: Two times a day (BID) | ORAL | Status: DC

## 2020-01-30 MED ORDER — LIDOCAINE-EPINEPHRINE 1 %-1:100000 IJ SOLN
INTRAMUSCULAR | Status: DC | PRN
  Administered 2020-01-30: 4 mL

## 2020-01-30 MED ORDER — ONDANSETRON HCL 4 MG/2ML IJ SOLN
INTRAMUSCULAR | Status: DC | PRN
  Administered 2020-01-30: 4 mg via INTRAVENOUS

## 2020-01-30 MED ORDER — FLEET ENEMA 7-19 GM/118ML RE ENEM: 1.0000 | ENEMA | Freq: Once | RECTAL | Status: DC | PRN

## 2020-01-30 MED ORDER — LIDOCAINE 2% (20 MG/ML) 5 ML SYRINGE
INTRAMUSCULAR | Status: DC | PRN
  Administered 2020-01-30: 50 mg via INTRAVENOUS

## 2020-01-30 MED ORDER — CHLORHEXIDINE GLUCONATE 0.12 % MT SOLN
15.0000 mL | Freq: Once | OROMUCOSAL | Status: AC
  Administered 2020-01-30: 15 mL via OROMUCOSAL
  Filled 2020-01-30: qty 15

## 2020-01-30 MED ORDER — DEXAMETHASONE SODIUM PHOSPHATE 10 MG/ML IJ SOLN
INTRAMUSCULAR | Status: AC
  Filled 2020-01-30: qty 1

## 2020-01-30 MED ORDER — ALPRAZOLAM 0.5 MG PO TABS: 1.0000 mg | ORAL_TABLET | Freq: Two times a day (BID) | ORAL | Status: DC | PRN

## 2020-01-30 MED ORDER — SODIUM CHLORIDE 0.9% FLUSH: 3.0000 mL | Freq: Two times a day (BID) | INTRAVENOUS | Status: DC

## 2020-01-30 MED ORDER — CHLORHEXIDINE GLUCONATE CLOTH 2 % EX PADS: 6.0000 | MEDICATED_PAD | Freq: Once | CUTANEOUS | Status: DC

## 2020-01-30 MED ORDER — BISACODYL 5 MG PO TBEC: 5.0000 mg | DELAYED_RELEASE_TABLET | Freq: Two times a day (BID) | ORAL | Status: DC | PRN

## 2020-01-30 MED ORDER — LIDOCAINE-EPINEPHRINE 1 %-1:100000 IJ SOLN
INTRAMUSCULAR | Status: AC
  Filled 2020-01-30: qty 1

## 2020-01-30 MED ORDER — DEXAMETHASONE 1 MG PO TABS
ORAL_TABLET | ORAL | 0 refills | Status: DC
Start: 2020-01-30 — End: 2020-10-28

## 2020-01-30 MED ORDER — POLYETHYLENE GLYCOL 3350 17 G PO PACK: 17.0000 g | PACK | Freq: Every day | ORAL | Status: DC | PRN

## 2020-01-30 MED ORDER — ACETAMINOPHEN 500 MG PO TABS: 1000.0000 mg | ORAL_TABLET | Freq: Four times a day (QID) | ORAL | Status: DC | PRN

## 2020-01-30 MED ORDER — HYDROCODONE-ACETAMINOPHEN 7.5-325 MG PO TABS
1.0000 | ORAL_TABLET | Freq: Three times a day (TID) | ORAL | Status: DC | PRN
  Administered 2020-01-30: 1 via ORAL
  Filled 2020-01-30: qty 1

## 2020-01-30 MED ORDER — METHOCARBAMOL 500 MG PO TABS: 500.0000 mg | ORAL_TABLET | Freq: Three times a day (TID) | ORAL | Status: DC | PRN

## 2020-01-30 MED ORDER — DEXAMETHASONE SODIUM PHOSPHATE 10 MG/ML IJ SOLN
INTRAMUSCULAR | Status: DC | PRN
  Administered 2020-01-30: 10 mg via INTRAVENOUS

## 2020-01-30 MED ORDER — ONDANSETRON HCL 4 MG/2ML IJ SOLN: 4.0000 mg | Freq: Once | INTRAMUSCULAR | Status: DC | PRN

## 2020-01-30 MED ORDER — ACETAMINOPHEN 650 MG RE SUPP: 650.0000 mg | RECTAL | Status: DC | PRN

## 2020-01-30 MED ORDER — LEVOTHYROXINE SODIUM 50 MCG PO TABS
50.0000 ug | ORAL_TABLET | Freq: Every day | ORAL | Status: DC
  Filled 2020-01-30: qty 1

## 2020-01-30 MED ORDER — CEFAZOLIN SODIUM-DEXTROSE 2-4 GM/100ML-% IV SOLN: 2.0000 g | Freq: Three times a day (TID) | INTRAVENOUS | Status: DC

## 2020-01-30 MED ORDER — SODIUM CHLORIDE 0.9 % IV SOLN
INTRAVENOUS | Status: DC | PRN
  Administered 2020-01-30: 500 mL

## 2020-01-30 MED ORDER — SODIUM CHLORIDE 0.9% FLUSH: 3.0000 mL | INTRAVENOUS | Status: DC | PRN

## 2020-01-30 MED ORDER — ALUM & MAG HYDROXIDE-SIMETH 200-200-20 MG/5ML PO SUSP: 30.0000 mL | Freq: Four times a day (QID) | ORAL | Status: DC | PRN

## 2020-01-30 MED ORDER — FENTANYL CITRATE (PF) 250 MCG/5ML IJ SOLN
INTRAMUSCULAR | Status: AC
  Filled 2020-01-30: qty 5

## 2020-01-30 MED ORDER — BISACODYL 10 MG RE SUPP: 10.0000 mg | Freq: Every day | RECTAL | Status: DC | PRN

## 2020-01-30 MED ORDER — SODIUM CHLORIDE 0.9 % IV SOLN
INTRAVENOUS | Status: DC | PRN
Start: 2020-01-30 — End: 2020-01-30

## 2020-01-30 MED ORDER — RISPERIDONE 1 MG PO TABS
1.0000 mg | ORAL_TABLET | Freq: Every day | ORAL | Status: DC
  Filled 2020-01-30: qty 1

## 2020-01-30 MED ORDER — ROCURONIUM BROMIDE 10 MG/ML (PF) SYRINGE
PREFILLED_SYRINGE | INTRAVENOUS | Status: AC
  Filled 2020-01-30: qty 10

## 2020-01-30 MED ORDER — ROCURONIUM BROMIDE 10 MG/ML (PF) SYRINGE
PREFILLED_SYRINGE | INTRAVENOUS | Status: AC
  Filled 2020-01-30: qty 30

## 2020-01-30 MED ORDER — BUPIVACAINE HCL (PF) 0.5 % IJ SOLN
INTRAMUSCULAR | Status: DC | PRN
  Administered 2020-01-30: 4 mL

## 2020-01-30 MED ORDER — HYDROXYZINE HCL 25 MG PO TABS: 75.0000 mg | ORAL_TABLET | Freq: Three times a day (TID) | ORAL | Status: DC | PRN

## 2020-01-30 MED ORDER — ONDANSETRON HCL 4 MG PO TABS: 4.0000 mg | ORAL_TABLET | Freq: Four times a day (QID) | ORAL | Status: DC | PRN

## 2020-01-30 MED ORDER — EPHEDRINE 5 MG/ML INJ
INTRAVENOUS | Status: AC
  Filled 2020-01-30: qty 10

## 2020-01-30 MED ORDER — MECLIZINE HCL 25 MG PO TABS
25.0000 mg | ORAL_TABLET | Freq: Three times a day (TID) | ORAL | Status: DC | PRN
  Filled 2020-01-30: qty 1

## 2020-01-30 MED ORDER — CITALOPRAM HYDROBROMIDE 20 MG PO TABS: 20.0000 mg | ORAL_TABLET | Freq: Every day | ORAL | Status: DC

## 2020-01-30 MED ORDER — MIDAZOLAM HCL 2 MG/2ML IJ SOLN
INTRAMUSCULAR | Status: DC | PRN
  Administered 2020-01-30: 2 mg via INTRAVENOUS

## 2020-01-30 MED ORDER — LIDOCAINE 2% (20 MG/ML) 5 ML SYRINGE
INTRAMUSCULAR | Status: AC
  Filled 2020-01-30: qty 5

## 2020-01-30 MED ORDER — EPHEDRINE SULFATE-NACL 50-0.9 MG/10ML-% IV SOSY
PREFILLED_SYRINGE | INTRAVENOUS | Status: DC | PRN
  Administered 2020-01-30: 10 mg via INTRAVENOUS

## 2020-01-30 MED ORDER — HYDROCHLOROTHIAZIDE 25 MG PO TABS: 25.0000 mg | ORAL_TABLET | Freq: Every day | ORAL | Status: DC

## 2020-01-30 MED ORDER — MENTHOL 3 MG MT LOZG: 1.0000 | LOZENGE | OROMUCOSAL | Status: DC | PRN

## 2020-01-30 MED ORDER — FENTANYL CITRATE (PF) 100 MCG/2ML IJ SOLN
25.0000 ug | INTRAMUSCULAR | Status: DC | PRN
  Administered 2020-01-30: 25 ug via INTRAVENOUS
  Administered 2020-01-30: 50 ug via INTRAVENOUS
  Administered 2020-01-30: 25 ug via INTRAVENOUS

## 2020-01-30 MED ORDER — ACETAMINOPHEN 325 MG PO TABS: 650.0000 mg | ORAL_TABLET | ORAL | Status: DC | PRN

## 2020-01-30 MED ORDER — LORATADINE 10 MG PO TABS: 10.0000 mg | ORAL_TABLET | Freq: Two times a day (BID) | ORAL | Status: DC | PRN

## 2020-01-30 MED ORDER — METOPROLOL TARTRATE 25 MG PO TABS: 25.0000 mg | ORAL_TABLET | Freq: Two times a day (BID) | ORAL | Status: DC

## 2020-01-30 MED ORDER — BUSPIRONE HCL 15 MG PO TABS
15.0000 mg | ORAL_TABLET | Freq: Two times a day (BID) | ORAL | Status: DC
  Filled 2020-01-30: qty 1

## 2020-01-30 MED ORDER — PHENYLEPHRINE HCL (PRESSORS) 10 MG/ML IV SOLN
INTRAVENOUS | Status: AC
  Filled 2020-01-30: qty 1

## 2020-01-30 MED ORDER — ROCURONIUM BROMIDE 10 MG/ML (PF) SYRINGE
PREFILLED_SYRINGE | INTRAVENOUS | Status: DC | PRN
  Administered 2020-01-30: 60 mg via INTRAVENOUS

## 2020-01-30 MED ORDER — METHOCARBAMOL 1000 MG/10ML IJ SOLN
500.0000 mg | Freq: Four times a day (QID) | INTRAVENOUS | Status: DC | PRN
  Filled 2020-01-30: qty 5

## 2020-01-30 MED ORDER — SUGAMMADEX SODIUM 500 MG/5ML IV SOLN
INTRAVENOUS | Status: AC
  Filled 2020-01-30: qty 5

## 2020-01-30 MED ORDER — PROPOFOL 10 MG/ML IV BOLUS
INTRAVENOUS | Status: DC | PRN
  Administered 2020-01-30: 30 mg via INTRAVENOUS
  Administered 2020-01-30: 120 mg via INTRAVENOUS
  Administered 2020-01-30: 10 mg via INTRAVENOUS

## 2020-01-30 MED ORDER — THROMBIN 5000 UNITS EX SOLR
OROMUCOSAL | Status: DC | PRN
  Administered 2020-01-30: 5 mL via TOPICAL

## 2020-01-30 MED ORDER — SUGAMMADEX SODIUM 200 MG/2ML IV SOLN
INTRAVENOUS | Status: DC | PRN
  Administered 2020-01-30: 200 mg via INTRAVENOUS

## 2020-01-30 MED ORDER — 0.9 % SODIUM CHLORIDE (POUR BTL) OPTIME
TOPICAL | Status: DC | PRN
  Administered 2020-01-30: 1000 mL

## 2020-01-30 MED ORDER — METHOCARBAMOL 500 MG PO TABS: 500.0000 mg | ORAL_TABLET | Freq: Four times a day (QID) | ORAL | Status: DC | PRN

## 2020-01-30 MED ORDER — HYDROMORPHONE HCL 1 MG/ML IJ SOLN
0.5000 mg | INTRAMUSCULAR | Status: DC | PRN
  Administered 2020-01-30: 0.5 mg via INTRAVENOUS
  Filled 2020-01-30: qty 0.5

## 2020-01-30 MED ORDER — THROMBIN 5000 UNITS EX SOLR
CUTANEOUS | Status: AC
  Filled 2020-01-30: qty 5000

## 2020-01-30 MED ORDER — ORAL CARE MOUTH RINSE: 15.0000 mL | Freq: Once | OROMUCOSAL | Status: AC

## 2020-01-30 MED ORDER — BUPIVACAINE HCL (PF) 0.5 % IJ SOLN
INTRAMUSCULAR | Status: AC
  Filled 2020-01-30: qty 30

## 2020-01-30 MED ORDER — SENNA 8.6 MG PO TABS: 1.0000 | ORAL_TABLET | Freq: Two times a day (BID) | ORAL | Status: DC

## 2020-01-30 MED ORDER — FENTANYL CITRATE (PF) 100 MCG/2ML IJ SOLN
INTRAMUSCULAR | Status: AC
  Filled 2020-01-30: qty 2

## 2020-01-30 MED ORDER — MIDAZOLAM HCL 2 MG/2ML IJ SOLN
INTRAMUSCULAR | Status: AC
  Filled 2020-01-30: qty 2

## 2020-01-30 MED ORDER — PHENYLEPHRINE HCL-NACL 10-0.9 MG/250ML-% IV SOLN
INTRAVENOUS | Status: DC | PRN
Start: 2020-01-30 — End: 2020-01-30
  Administered 2020-01-30: 40 ug/min via INTRAVENOUS

## 2020-01-30 MED ORDER — PROPOFOL 10 MG/ML IV BOLUS
INTRAVENOUS | Status: AC
  Filled 2020-01-30: qty 20

## 2020-01-30 SURGICAL SUPPLY — 54 items
ADH SKN CLS APL DERMABOND .7 (GAUZE/BANDAGES/DRESSINGS) ×1
BAG DECANTER FOR FLEXI CONT (MISCELLANEOUS) ×3 IMPLANT
BAND INSRT 18 STRL LF DISP RB (MISCELLANEOUS)
BAND RUBBER #18 3X1/16 STRL (MISCELLANEOUS) IMPLANT
BIT DRILL ACP 13 (DRILL) IMPLANT
BIT DRILL NEURO 2X3.1 SFT TUCH (MISCELLANEOUS) ×1 IMPLANT
BNDG GAUZE ELAST 4 BULKY (GAUZE/BANDAGES/DRESSINGS) IMPLANT
BONE MATRIX OSTEOCEL PRO SM (Bone Implant) ×2 IMPLANT
BUR BARREL STRAIGHT FLUTE 4.0 (BURR) ×2 IMPLANT
CAGE CERV MOD 8X15X12 7D (Cage) ×2 IMPLANT
CANISTER SUCT 3000ML PPV (MISCELLANEOUS) ×3 IMPLANT
COVER WAND RF STERILE (DRAPES) ×1 IMPLANT
DECANTER SPIKE VIAL GLASS SM (MISCELLANEOUS) ×3 IMPLANT
DERMABOND ADVANCED (GAUZE/BANDAGES/DRESSINGS) ×2
DERMABOND ADVANCED .7 DNX12 (GAUZE/BANDAGES/DRESSINGS) ×1 IMPLANT
DRAPE LAPAROTOMY 100X72 PEDS (DRAPES) ×3 IMPLANT
DRAPE MICROSCOPE LEICA (MISCELLANEOUS) IMPLANT
DRILL ACP 13 (DRILL) ×3
DRILL NEURO 2X3.1 SOFT TOUCH (MISCELLANEOUS) ×3
DURAPREP 6ML APPLICATOR 50/CS (WOUND CARE) ×3 IMPLANT
ELECT REM PT RETURN 9FT ADLT (ELECTROSURGICAL) ×3
ELECTRODE REM PT RTRN 9FT ADLT (ELECTROSURGICAL) ×1 IMPLANT
GAUZE 4X4 16PLY RFD (DISPOSABLE) IMPLANT
GLOVE BIOGEL PI IND STRL 6.5 (GLOVE) IMPLANT
GLOVE BIOGEL PI IND STRL 7.5 (GLOVE) IMPLANT
GLOVE BIOGEL PI IND STRL 8.5 (GLOVE) ×1 IMPLANT
GLOVE BIOGEL PI INDICATOR 6.5 (GLOVE) ×2
GLOVE BIOGEL PI INDICATOR 7.5 (GLOVE) ×2
GLOVE BIOGEL PI INDICATOR 8.5 (GLOVE) ×2
GLOVE ECLIPSE 6.5 STRL STRAW (GLOVE) ×2 IMPLANT
GLOVE ECLIPSE 8.5 STRL (GLOVE) ×3 IMPLANT
GLOVE SURG SS PI 6.0 STRL IVOR (GLOVE) ×6 IMPLANT
GLOVE SURG SS PI 7.5 STRL IVOR (GLOVE) ×2 IMPLANT
GOWN STRL REUS W/ TWL LRG LVL3 (GOWN DISPOSABLE) IMPLANT
GOWN STRL REUS W/ TWL XL LVL3 (GOWN DISPOSABLE) ×1 IMPLANT
GOWN STRL REUS W/TWL 2XL LVL3 (GOWN DISPOSABLE) ×3 IMPLANT
GOWN STRL REUS W/TWL LRG LVL3 (GOWN DISPOSABLE) ×12
GOWN STRL REUS W/TWL XL LVL3 (GOWN DISPOSABLE)
HALTER HD/CHIN CERV TRACTION D (MISCELLANEOUS) ×3 IMPLANT
HEMOSTAT POWDER KIT SURGIFOAM (HEMOSTASIS) ×3 IMPLANT
KIT BASIN OR (CUSTOM PROCEDURE TRAY) ×3 IMPLANT
NDL SPNL 22GX3.5 QUINCKE BK (NEEDLE) ×1 IMPLANT
NEEDLE HYPO 22GX1.5 SAFETY (NEEDLE) ×3 IMPLANT
NEEDLE SPNL 22GX3.5 QUINCKE BK (NEEDLE) ×3 IMPLANT
NS IRRIG 1000ML POUR BTL (IV SOLUTION) ×3 IMPLANT
PACK LAMINECTOMY NEURO (CUSTOM PROCEDURE TRAY) ×3 IMPLANT
PAD ARMBOARD 7.5X6 YLW CONV (MISCELLANEOUS) ×11 IMPLANT
PLATE ACP 1-LEVEL1.6V22 (Plate) ×2 IMPLANT
SCREW ACP VA ST 3.5X15 (Screw) ×8 IMPLANT
SPONGE INTESTINAL PEANUT (DISPOSABLE) ×3 IMPLANT
SUT VIC AB 4-0 RB1 18 (SUTURE) ×3 IMPLANT
TOWEL GREEN STERILE (TOWEL DISPOSABLE) ×3 IMPLANT
TOWEL GREEN STERILE FF (TOWEL DISPOSABLE) ×3 IMPLANT
WATER STERILE IRR 1000ML POUR (IV SOLUTION) ×3 IMPLANT

## 2020-01-30 NOTE — Plan of Care (Signed)
Pt and husband given D/C instructions with verbal understanding. Rx's were sent to the pharmacy by MD. Pt's incision is clean and dry with no sign of infection. Pt's IV was removed prior to D/C. Pt D/C'd home via wheelchair per MD order. Pt is stable @ D/C and has no other needs at this time. Reyonna Haack, RN 

## 2020-01-30 NOTE — Op Note (Signed)
Date of surgery: 01/30/2020 Preoperative diagnosis: Subluxation C3-C4 status post disc arthroplasty.  History of cervical fusion C4-C7. Postoperative diagnosis: Same Procedure: Removal of cervical disc arthroplasty C3-C4 decompression of cervical spinal canal with arthrodesis using titanium implant allograft and anterior plate fixation Surgeon: Barnett Abu First Assistant: Coletta Memos MD Anesthesia: General endotracheal Indications: [name] is a 59 year old individuals had extensive cervical spondylitic disease previously had a fusion at C4-C7.  She developed degenerative changes at C3-4 and was felt that an arthroplasty would help maintain the mobility of her cervical spine.  This seemed to respond reasonably well for the past year and a half but over time it appears that she is developing an increasing subluxation with flexion and extension and this is created increased neck pain and some radiculopathic and myelopathic symptoms.  After reviewing plain x-rays that showed extended listhesis with her flexing her neck I advised that the arthroplasty should be removed and the C3-4 joint should be fused.  Procedure: Patient was brought to the operating room supine on the stretcher.  After the smooth induction of general endotracheal anesthesia her head was placed in a horseshoe headrest with 5 pounds of halter traction being applied.  The neck was prepped with alcohol DuraPrep and draped in a sterile fashion transverse incision was made through the previously made incision in the left upper portion of the neck.  The dissection was then carried down through the platysma into the space between the sternocleidomastoid and the soft tissues of the muscle which were retracted medially.  The prevertebral space was identified and then by dissecting carefully to remain retropharyngeal we identified the disc arthroplasty.  As a disc arthroplasty was uncovered of a thick layer of fibrous tissue over it it was  apparent that the superior portion of the arthroplasty is attached to the inferior endplate of C3 was loose this was freely mobile and was easily removed with a needle holder grasping it firmly on one end.  With the superior plate being removed the plastic component could easily be mobilized and removed inferior plate was attached to the C4 vertebral body and this required loosening it by gently tapping it with an osteotome to release it from the bony adhesions at C4.  Once the device was removed we then cleared the significant scar tissue that was in the disc space around the arthroplasty itself.  Region of the posterior longitudinal ligament was then carefully decompressed removing substantial scar tissue which was adherent to the dura and areas and made the dissection difficult the inferior endplate of C3 was also removed as it had overgrown to reform a significant stenosis once this was all accomplished hemostasis in the soft tissues and the bony endplates was established then a sizer was used to measure the appropriate size of the implant and it was felt that a MOD C cervical implant measuring 8 x 15 x 12 mm with 7 degrees of lordosis would fit best this was filled with ostia cell allograft and then placed into the interspace it was secured with a 22 mm anterior cervical plate and 15 mm variable angle screws.  Final radiograph identified good position of the hardware.  The wound was then carefully checked for hemostasis and when verified the platysma was closed with 3-0 Vicryl roll in interrupted fashion 4-0 Vicryl was used in a subcuticular skin.  Dermabond was placed on the skin blood loss for the procedure was estimated 50 cc.

## 2020-01-30 NOTE — Discharge Instructions (Signed)

## 2020-01-30 NOTE — H&P (Signed)
[name] is an 59 y.o. female.   Chief Complaint: Neck pain, shoulder pain dysesthesias in the arms. HPI: [name] is a 59 year old individual whose had previous cervical spondylitic disease she underwent anterior cervical decompression arthroplasty at C3-4.  This is done well for reasonable period of time but now she has had increasing neck pain shoulder dysesthesias and arm pain and on flexion-extension films it appears that she is developing a subluxation at the C3-C4 level.  After careful consideration of her options I advised surgical decompression of C3-4 with a fusion she already has a fusion multiple levels below to the level of C7.  Past Medical History:  Diagnosis Date  . Back injury    Chronic  . Bipolar disorder ([place])   . Cardiomyopathy ([place])    hypertension controlled-helped with edema-Dr Nahser  . Deafness    Left ear  . Degenerative joint disease    with neck surgery  . Depression   . Diastolic dysfunction    with possible mild LVOT gradient  . Dyspnea   . Dysrhythmia    per patient, took too much naproxen which caused an "irregular heart beat"  . Family history of adverse reaction to anesthesia    " my daughter has PONV and gets very grumpy"  . Fibromyalgia   . Former cigarette smoker   . Gestational diabetes   . H/O blood clots    due to side effect of medication  . Headache(784.0)    migraines  . Hypertension   . Hypothyroidism   . Leg cramps   . Neuropathy   . Pain    chronic   . Pneumonia   . PTSD (post-traumatic stress disorder)   . Respiratory disorder   . Schizophrenia [facility])     Past Surgical History:  Procedure Laterality Date  . ABDOMINAL HYSTERECTOMY     partial  . APPENDECTOMY    . CARDIAC CATHETERIZATION     Ejection Fraction 65-70%  . CERVICAL DISC ARTHROPLASTY N/A 02/21/2016   Procedure: Cervical three-four artificial disc replacement;  Surgeon: Kristeen Miss, MD;  Location: [facility];  Service: Neurosurgery;   Laterality: N/A;  . CERVICAL FUSION     C5-6  . 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 HERNIA REPAIR     " as a baby"  . NECK SURGERY    . OTHER SURGICAL HISTORY     History of cervical and lumbar disk surgery  . PERCUTANEOUS PINNING Left 12/01/2019   Procedure: Closed reduction percutaneous pinning left ring finger proximal phalanx fracture;  Surgeon: Cindra Presume, MD;  Location: [place];  Service: Plastics;  Laterality: Left;  . polypectomies     tubes  . POSTERIOR CERVICAL LAMINECTOMY WITH MET- RX Left 06/13/2018   Procedure: Left Cervical three-four Laminotomy/foraminotomy;  Surgeon: Kristeen Miss, MD;  Location: [place];  Service: Neurosurgery;  Laterality: Left;  . TONSILLECTOMY    . TONSILLECTOMY AND ADENOIDECTOMY      Family History  Problem Relation Age of Onset  . Coronary artery disease Father   . Parkinson's disease Mother   . Cancer Sister        breast  . Diabetes Sister   . Diabetes Brother    Social History:  reports that she has been smoking cigarettes. She has a 12.50 pack-year smoking history. She has never used smokeless tobacco. She reports current alcohol use. She reports that she does not use drugs.  Allergies:  Allergies  Allergen Reactions  . Morphine And Related Other (See Comments)    "Didn't agree with me/wasn't very effective.."  . Lyrica [Pregabalin] Rash  . Neurontin [Gabapentin] Other (See Comments)    Left patient sleepy all the time    No medications prior to admission.    No results found for this or any previous visit (from the past 48 hour(s)). No results found.  Review of Systems  Constitutional: Positive for activity change.  HENT: Negative.   Eyes: Negative.   Respiratory: Negative.   Cardiovascular: Negative.   Gastrointestinal: Negative.   Endocrine: Negative.   Genitourinary: Negative.   Musculoskeletal: Positive for neck pain.  Allergic/Immunologic: Negative.   Neurological: Positive for  weakness, light-headedness and headaches.  Hematological: Negative.   Psychiatric/Behavioral: Negative.     There were no vitals taken for this visit. Physical Exam  Constitutional: She is oriented to person, place, and time. She appears well-developed and well-nourished.  HENT:  Head: Normocephalic and atraumatic.  Eyes: Pupils are equal, round, and reactive to light. Conjunctivae and EOM are normal.  Neck:  Markedly limited range of motion of neck turning only 15 degrees to the left into the right extending and flexing less than 10 degrees  Cardiovascular: Normal rate and regular rhythm.  Respiratory: Effort normal and breath sounds normal.  GI: Soft. Bowel sounds are normal.  Musculoskeletal:     Comments: Marked limitation in range of motion of the cervical spine and the lumbar spine secondary to degenerative arthritis,  And surgical fusions  Neurological: She is alert and oriented to person, place, and time.  Skin: Skin is warm and dry.  Psychiatric: She has a normal mood and affect. Her behavior is normal. Judgment and thought content normal.     Assessment/Plan Subluxation C3-C4 with myelopathy.  Plan: Decompression C3-4 with arthrodesis C3-4 anteriorly.  Jeanette Newport, MD 01/30/2020, 8:18 AM

## 2020-01-30 NOTE — Evaluation (Signed)
Physical Therapy Evaluation Patient Details Name: [name] MRN: [id number] DOB: 1961/07/25 Today's Date: 01/30/2020   History of Present Illness  pt is a 59 y/o female withh/o back injury, bipolar d/o CM, DJD, admitted with neck and shoulder pain/dysesthesias in the arms, s/p removal of cervical disc arthroplasty,c3-C4, decompression of same level, arthrodesis with fixation and grafting.  Clinical Impression  Pt is at or close to baseline functioning and should be safe at home with available assist. There are no further acute PT needs.  Will sign off at this time.     Follow Up Recommendations No PT follow up    Equipment Recommendations  None recommended by PT    Recommendations for Other Services       Precautions / Restrictions Precautions Precautions: Cervical Precaution Booklet Issued: Yes (comment)      Mobility  Bed Mobility Overal bed mobility: Needs Assistance Bed Mobility: Rolling;Sidelying to Sit;Sit to Sidelying Rolling: Supervision Sidelying to sit: Min guard     Sit to sidelying: Min guard General bed mobility comments: instructed in safe technique, practiced, now does not need assist, but needs more practice.  Transfers Overall transfer level: Needs assistance   Transfers: Sit to/from Stand Sit to Stand: Supervision            Ambulation/Gait Ambulation/Gait assistance: Supervision Gait Distance (Feet): 300 Feet   Gait Pattern/deviations: Step-through pattern   Gait velocity interpretation: 1.31 - 2.62 ft/sec, indicative of limited community ambulator General Gait Details: generally steady, but tentative.  ABle to increase gait speed, though not age appropriate.  Stairs Stairs: Yes Stairs assistance: Min guard Stair Management: One rail Left;Step to pattern;Forwards Number of Stairs: 4 General stair comments: safe with the rail  Wheelchair Mobility    Modified Rankin (Stroke Patients Only)       Balance Overall balance  assessment: Needs assistance   Sitting balance-Leahy Scale: Good       Standing balance-Leahy Scale: Good                               Pertinent Vitals/Pain Pain Assessment: Faces Faces Pain Scale: Hurts even more Pain Location: cervical incision Pain Descriptors / Indicators: Discomfort Pain Intervention(s): Monitored during session    Home Living Family/patient expects to be discharged to:: Private residence Living Arrangements: Spouse/significant other Available Help at Discharge: Family;Available 24 hours/day Type of Home: House Home Access: Stairs to enter Entrance Stairs-Rails: (post) Secretary/administrator of Steps: 2 Home Layout: One level Home Equipment: None      Prior Function Level of Independence: Independent         Comments: on disability from work     Higher education careers adviser        Extremity/Trunk Assessment   Upper Extremity Assessment Upper Extremity Assessment: (NT formally, functional)    Lower Extremity Assessment Lower Extremity Assessment: Overall WFL for tasks assessed       Communication   Communication: No difficulties  Cognition Arousal/Alertness: Awake/alert Behavior During Therapy: WFL for tasks assessed/performed Overall Cognitive Status: Within Functional Limits for tasks assessed                                        General Comments General comments (skin integrity, edema, etc.): pt/husband instructed in cervical care/prec, log roll, lifting restrictions and progression of activity.    Exercises  Assessment/Plan    PT Assessment Patent does not need any further PT services  PT Problem List         PT Treatment Interventions      PT Goals (Current goals can be found in the Care Plan section)  Acute Rehab PT Goals PT Goal Formulation: All assessment and education complete, DC therapy    Frequency     Barriers to discharge        Co-evaluation               AM-PAC PT "6  Clicks" Mobility  Outcome Measure Help needed turning from your back to your side while in a flat bed without using bedrails?: A Little Help needed moving from lying on your back to sitting on the side of a flat bed without using bedrails?: A Little Help needed moving to and from a bed to a chair (including a wheelchair)?: None Help needed standing up from a chair using your arms (e.g., wheelchair or bedside chair)?: None Help needed to walk in hospital room?: None Help needed climbing 3-5 steps with a railing? : None 6 Click Score: 22    End of Session   Activity Tolerance: Patient tolerated treatment well Patient left: in bed;with call bell/phone within reach;with family/visitor present Nurse Communication: Mobility status PT Visit Diagnosis: Other abnormalities of gait and mobility (R26.89);Pain Pain - part of body: (neck)    Time: 7262-0355 PT Time Calculation (min) (ACUTE ONLY): 27 min   Charges:   PT Evaluation $PT Eval Low Complexity: 1 Low PT Treatments $Gait Training: 8-22 mins        01/30/2020  Ginger Carne., PT Acute Rehabilitation Services [phone]  (pager) [phone]  (office)  Tessie Fass Gregroy Dombkowski 01/30/2020, 5:46 PM

## 2020-01-30 NOTE — Discharge Summary (Signed)
Physician Discharge Summary  Patient ID: [name] MRN: [id number] DOB/AGE: [birth year] 57 y.o.  Admit date: 01/30/2020 Discharge date: 01/30/2020  Admission Diagnoses:cervical subluxation  C3-4.  Discharge Diagnoses: Cervical subluxation C3-4 History of fusion C4-7 Active Problems:   Cervical subluxation   Discharged Condition: good  Hospital Course: tolerated surgery well  Consults: None  Significant Diagnostic Studies: none  Treatments: ACDF C3-4  Discharge Exam: Blood pressure (!) 150/88, pulse 64, temperature 98.3 F (36.8 C), temperature source Oral, resp. rate 16, height 5\' 5"  (1.651 m), weight 68.9 kg, SpO2 (!) 88 %. incision clean and dry motor functin normal  Disposition: Discharge disposition: 01-Home or Self Care       Discharge Instructions    Call MD for:  redness, tenderness, or signs of infection (pain, swelling, redness, odor or green/yellow discharge around incision site)   Complete by: As directed    Call MD for:  severe uncontrolled pain   Complete by: As directed    Call MD for:  temperature >100.4   Complete by: As directed    Diet - low sodium heart healthy   Complete by: As directed    Discharge wound care:   Complete by: As directed    Okay to shower. Do not apply salves or appointments to incision. No heavy lifting with the upper extremities greater than 15 pounds. May resume driving when not requiring pain medication and patient feels comfortable with doing so.   Incentive spirometry RT   Complete by: As directed    Increase activity slowly   Complete by: As directed      Allergies as of 01/30/2020      Reactions   Morphine And Related Other (See Comments)   "Didn't agree with me/wasn't very effective.."   Lyrica [pregabalin] Rash   Neurontin [gabapentin] Other (See Comments)   Left patient sleepy all the time      Medication List    TAKE these medications   acetaminophen 500 MG tablet Commonly known as: TYLENOL Take  1,000 mg by mouth every 6 (six) hours as needed for mild pain or headache.   ALPRAZolam 1 MG tablet Commonly known as: Xanax Take 1 tablet (1 mg total) by mouth 2 (two) times daily as needed for anxiety.   bisacodyl 5 MG EC tablet Commonly known as: DULCOLAX Take 5 mg by mouth 2 (two) times daily as needed for moderate constipation.   busPIRone 30 MG tablet Commonly known as: BUSPAR Take 0.5 tablets (15 mg total) by mouth 2 (two) times daily.   citalopram 20 MG tablet Commonly known as: CELEXA Take 1 tablet (20 mg total) by mouth daily.   dexamethasone 1 MG tablet Commonly known as: DECADRON 2 tablets twice daily for 2 days, one tablet twice daily for 2 days, one tablet daily for 2 days.   hydrochlorothiazide 25 MG tablet Commonly known as: HYDRODIURIL TAKE 1 TABLET(25 MG) BY MOUTH DAILY What changed:   how much to take  how to take this  when to take this  additional instructions   HYDROcodone-acetaminophen 7.5-325 MG tablet Commonly known as: NORCO Take 1 tablet by mouth every 8 (eight) hours as needed for moderate pain.   hydrOXYzine 25 MG tablet Commonly known as: ATARAX/VISTARIL TAKE 3 TO 4 TABLETS BY MOUTH EVERY 8 HOURS AS NEEDED FOR ANXIETY. MAX AMOUNT PER INSURANCE What changed: See the new instructions.   levothyroxine 50 MCG tablet Commonly known as: SYNTHROID TAKE 1 TABLET BY MOUTH EVERY DAY  loratadine 10 MG tablet Commonly known as: CLARITIN Take 10 mg by mouth 2 (two) times daily as needed (for seasonal allergies).   meclizine 25 MG tablet Commonly known as: ANTIVERT Take 1 tablet (25 mg total) by mouth 3 (three) times daily as needed for dizziness.   methocarbamol 500 MG tablet Commonly known as: ROBAXIN Take 1 tablet (500 mg total) by mouth every 8 (eight) hours as needed for muscle spasms.   metoprolol tartrate 25 MG tablet Commonly known as: LOPRESSOR Take 1 tablet (25 mg total) by mouth 2 (two) times daily.   naproxen 500 MG  tablet Commonly known as: NAPROSYN TAKE 1 TAB DAILY AS NEEDED FOR PAIN. USE SPARINGLY. What changed: See the new instructions.   oxyCODONE-acetaminophen 5-325 MG tablet Commonly known as: Percocet Take 1 tablet by mouth every 4 (four) hours as needed for moderate pain or severe pain.   risperiDONE 1 MG tablet Commonly known as: RISPERDAL Take one tablet at bedtime. What changed:   how much to take  how to take this  when to take this  additional instructions   rivaroxaban 20 MG Tabs tablet Commonly known as: Xarelto Take 1 tablet (20 mg total) by mouth daily with supper. What changed: when to take this            Discharge Care Instructions  (From admission, onward)         Start     Ordered   01/30/20 0000  Discharge wound care:    Comments: Okay to shower. Do not apply salves or appointments to incision. No heavy lifting with the upper extremities greater than 15 pounds. May resume driving when not requiring pain medication and patient feels comfortable with doing so.   01/30/20 1846           Signed: Shary Key Gregoria Selvy 01/30/2020, 6:47 PM

## 2020-01-30 NOTE — Anesthesia Postprocedure Evaluation (Signed)
Anesthesia Post Note  Patient: [name]  Procedure(s) Performed: Cervical Three-FourAnterior cervical decompression/discectomy/fusion with Hardware Removal. (N/A Spine Cervical)     Patient location during evaluation: PACU Anesthesia Type: General Level of consciousness: awake and alert Pain management: pain level controlled Vital Signs Assessment: post-procedure vital signs reviewed and stable Respiratory status: spontaneous breathing, nonlabored ventilation, respiratory function stable and patient connected to nasal cannula oxygen Cardiovascular status: blood pressure returned to baseline and stable Postop Assessment: no apparent nausea or vomiting Anesthetic complications: no    Last Vitals:  Vitals:   01/30/20 1606 01/30/20 1633  BP: (!) 155/76 (!) 150/88  Pulse: 73 64  Resp: 17 16  Temp: 36.8 C 36.8 C  SpO2: 98% (!) 88%    Last Pain:  Vitals:   01/30/20 1633  TempSrc: Oral  PainSc:                  Joley Utecht COKER

## 2020-01-30 NOTE — Transfer of Care (Signed)
Immediate Anesthesia Transfer of Care Note  Patient: [name]  Procedure(s) Performed: Cervical Three-FourAnterior cervical decompression/discectomy/fusion with Hardware Removal. (N/A Spine Cervical)  Patient Location: PACU  Anesthesia Type:General  Level of Consciousness: awake, oriented, patient cooperative and responds to stimulation  Airway & Oxygen Therapy: Patient Spontanous Breathing and Patient connected to nasal cannula oxygen  Post-op Assessment: Report given to RN and Post -op Vital signs reviewed and stable  Post vital signs: Reviewed and stable  Last Vitals:  Vitals Value Taken Time  BP 180/102 01/30/20 1436  Temp    Pulse 76 01/30/20 1439  Resp 12 01/30/20 1439  SpO2 98 % 01/30/20 1439  Vitals shown include unvalidated device data.  Last Pain:  Vitals:   01/30/20 1013  TempSrc:   PainSc: 6          Complications: No apparent anesthesia complications

## 2020-01-31 ENCOUNTER — Encounter: Payer: BLUE CROSS/BLUE SHIELD | Admitting: Occupational Therapy

## 2020-02-02 ENCOUNTER — Ambulatory Visit: Payer: BLUE CROSS/BLUE SHIELD | Admitting: Physical Medicine and Rehabilitation

## 2020-02-02 ENCOUNTER — Encounter (HOSPITAL_COMMUNITY): Payer: Self-pay | Admitting: Neurological Surgery

## 2020-02-05 ENCOUNTER — Encounter: Payer: BLUE CROSS/BLUE SHIELD | Admitting: Occupational Therapy

## 2020-02-08 ENCOUNTER — Encounter: Payer: BLUE CROSS/BLUE SHIELD | Admitting: Occupational Therapy

## 2020-02-12 ENCOUNTER — Encounter: Payer: BLUE CROSS/BLUE SHIELD | Admitting: Occupational Therapy

## 2020-02-13 ENCOUNTER — Other Ambulatory Visit: Payer: Self-pay | Admitting: Family Medicine

## 2020-02-14 ENCOUNTER — Encounter: Payer: BLUE CROSS/BLUE SHIELD | Admitting: Occupational Therapy

## 2020-02-15 DIAGNOSIS — M4312 Spondylolisthesis, cervical region: Secondary | ICD-10-CM | POA: Diagnosis not present

## 2020-02-16 ENCOUNTER — Encounter: Payer: BLUE CROSS/BLUE SHIELD | Admitting: Physical Medicine and Rehabilitation

## 2020-02-19 ENCOUNTER — Encounter: Payer: BLUE CROSS/BLUE SHIELD | Admitting: Occupational Therapy

## 2020-02-21 ENCOUNTER — Encounter: Payer: BLUE CROSS/BLUE SHIELD | Admitting: Occupational Therapy

## 2020-03-15 ENCOUNTER — Other Ambulatory Visit: Payer: Self-pay | Admitting: Adult Health

## 2020-03-15 DIAGNOSIS — F411 Generalized anxiety disorder: Secondary | ICD-10-CM

## 2020-03-15 DIAGNOSIS — M4312 Spondylolisthesis, cervical region: Secondary | ICD-10-CM | POA: Diagnosis not present

## 2020-03-19 ENCOUNTER — Other Ambulatory Visit: Payer: Self-pay | Admitting: Adult Health

## 2020-03-19 DIAGNOSIS — F411 Generalized anxiety disorder: Secondary | ICD-10-CM

## 2020-03-20 ENCOUNTER — Other Ambulatory Visit: Payer: Self-pay

## 2020-03-20 ENCOUNTER — Ambulatory Visit (INDEPENDENT_AMBULATORY_CARE_PROVIDER_SITE_OTHER): Payer: BLUE CROSS/BLUE SHIELD | Admitting: Adult Health

## 2020-03-20 ENCOUNTER — Encounter: Payer: Self-pay | Admitting: Adult Health

## 2020-03-20 DIAGNOSIS — F4310 Post-traumatic stress disorder, unspecified: Secondary | ICD-10-CM

## 2020-03-20 DIAGNOSIS — F3181 Bipolar II disorder: Secondary | ICD-10-CM | POA: Diagnosis not present

## 2020-03-20 DIAGNOSIS — F411 Generalized anxiety disorder: Secondary | ICD-10-CM | POA: Diagnosis not present

## 2020-03-20 DIAGNOSIS — F331 Major depressive disorder, recurrent, moderate: Secondary | ICD-10-CM | POA: Diagnosis not present

## 2020-03-20 NOTE — Progress Notes (Signed)
[name] [id number] 03-May-1961 59 y.o.  Subjective:   Patient ID:  [name] is a 59 y.o. (DOB 30-May-1961) female.  Chief Complaint: No chief complaint on file.   HPI [name] presents to the office today for follow-up of MDD, GAD, PTSD, BPD 2.   Describes mood today as "ok". Pleasant. Denies tearfulness. Mood symptoms - reports decreased depression, anxiety, and irritability. Mood is "level". Stating "I'm doing pretty good". Started feeling better over past few weeks. She and husband doing well. Stable interest and motivation. Taking medications as prescribed.  Energy levels improved. Active, does not have a regular exercise routine.  Enjoys some usual interests and activities. Married. Lives with husband of 40 years their 3 dogs. Seeing and talking to children - 1 son and 1 daughter. Spending time with family. Appetite adequate. Weight stable - 150 pounds. Sleeps well most nights. Averages 6 hours. Some daytime napping.   Focus and concentration stable. Completing tasks. Managing aspects of household. Disabled. Denies SI or HI. Denies AH or VH.  Previous medications - Abilify, Wellbutrin, Gabapentin, Lyrica, Prozac     Review of Systems:  Review of Systems  Musculoskeletal: Negative for gait problem.  Neurological: Negative for tremors.  Psychiatric/Behavioral:       Please refer to HPI    Medications: I have reviewed the patient's current medications.  Current Outpatient Medications  Medication Sig Dispense Refill   acetaminophen (TYLENOL) 500 MG tablet Take 1,000 mg by mouth every 6 (six) hours as needed for mild pain or headache.      ALPRAZolam (XANAX) 1 MG tablet TAKE 1 TABLET (1 MG TOTAL) BY MOUTH 2 (TWO) TIMES DAILY AS NEEDED FOR ANXIETY. 60 tablet 2   bisacodyl (DULCOLAX) 5 MG EC tablet Take 5 mg by mouth 2 (two) times daily as needed for moderate constipation.      busPIRone (BUSPAR) 30 MG tablet Take 0.5 tablets (15 mg total) by mouth 2  (two) times daily. 180 tablet 1   citalopram (CELEXA) 20 MG tablet Take 1 tablet (20 mg total) by mouth daily. 90 tablet 1   dexamethasone (DECADRON) 1 MG tablet 2 tablets twice daily for 2 days, one tablet twice daily for 2 days, one tablet daily for 2 days. 15 tablet 0   hydrochlorothiazide (HYDRODIURIL) 25 MG tablet TAKE 1 TABLET(25 MG) BY MOUTH DAILY (Patient taking differently: Take 25 mg by mouth daily. ) 90 tablet 3   HYDROcodone-acetaminophen (NORCO) 7.5-325 MG tablet Take 1 tablet by mouth every 8 (eight) hours as needed for moderate pain. 60 tablet 0   hydrOXYzine (ATARAX/VISTARIL) 25 MG tablet TAKE 3 TO 4 TABLETS BY MOUTH EVERY 8 HOURS AS NEEDED FOR ANXIETY. MAX AMOUNT PER INSURANCE (Patient taking differently: Take 75-100 mg by mouth every 8 (eight) hours as needed for anxiety. TAKE 3 TO 4 TABLETS BY MOUTH EVERY 8 HOURS AS NEEDED FOR ANXIETY. MAX AMOUNT PER INSURANCE) 360 tablet 2   levothyroxine (SYNTHROID) 50 MCG tablet TAKE 1 TABLET BY MOUTH EVERY DAY (Patient taking differently: Take 50 mcg by mouth daily. ) 90 tablet 0   loratadine (CLARITIN) 10 MG tablet Take 10 mg by mouth 2 (two) times daily as needed (for seasonal allergies).      meclizine (ANTIVERT) 25 MG tablet Take 1 tablet (25 mg total) by mouth 3 (three) times daily as needed for dizziness. 30 tablet 3   methocarbamol (ROBAXIN) 500 MG tablet Take 1 tablet (500 mg total) by mouth every 8 (eight)  hours as needed for muscle spasms. 90 tablet 2   metoprolol tartrate (LOPRESSOR) 25 MG tablet Take 1 tablet (25 mg total) by mouth 2 (two) times daily. 180 tablet 3   naproxen (NAPROSYN) 500 MG tablet TAKE 1 TAB DAILY AS NEEDED FOR PAIN. USE SPARINGLY. 30 tablet 1   oxyCODONE-acetaminophen (PERCOCET) 5-325 MG tablet Take 1 tablet by mouth every 4 (four) hours as needed for moderate pain or severe pain. 40 tablet 0   risperiDONE (RISPERDAL) 1 MG tablet Take one tablet at bedtime. (Patient taking differently: Take 1 mg by  mouth at bedtime. ) 90 tablet 1   rivaroxaban (XARELTO) 20 MG TABS tablet Take 1 tablet (20 mg total) by mouth daily with supper. (Patient taking differently: Take 20 mg by mouth daily. ) 90 tablet 2   No current facility-administered medications for this visit.    Medication Side Effects: None  Allergies:  Allergies  Allergen Reactions   Morphine And Related Other (See Comments)    "Didn't agree with me/wasn't very effective.."   Lyrica [Pregabalin] Rash   Neurontin [Gabapentin] Other (See Comments)    Left patient sleepy all the time    Past Medical History:  Diagnosis Date   Back injury    Chronic   Bipolar disorder (HCC)    Cardiomyopathy (HCC)    hypertension controlled-helped with edema-Dr Nahser   Deafness    Left ear   Degenerative joint disease    with neck surgery   Depression    Diastolic dysfunction    with possible mild LVOT gradient   Dyspnea    Dysrhythmia    per patient, took too much naproxen which caused an "irregular heart beat"   Family history of adverse reaction to anesthesia    " my daughter has PONV and gets very grumpy"   Fibromyalgia    Former cigarette smoker    Gestational diabetes    H/O blood clots    due to side effect of medication   Headache(784.0)    migraines   Hypertension    Hypothyroidism    Leg cramps    Neuropathy    Pain    chronic    Pneumonia    PTSD (post-traumatic stress disorder)    Respiratory disorder    Schizophrenia (HCC)     Family History  Problem Relation Age of Onset   Coronary artery disease Father    Parkinson's disease Mother    Cancer Sister        breast   Diabetes Sister    Diabetes Brother     Social History   Socioeconomic History   Marital status: Married    Spouse name: Reuel Boom   Number of children: 2   Years of education: 12   Highest education level: Not on file  Occupational History    Comment: homemaker  Tobacco Use   Smoking status:  Current Every Day Smoker    Packs/day: 0.50    Years: 25.00    Pack years: 12.50    Types: Cigarettes   Smokeless tobacco: Never Used  Building services engineer Use: Never used  Substance and Sexual Activity   Alcohol use: Yes    Comment: very rare   Drug use: No   Sexual activity: Not on file  Other Topics Concern   Not on file  Social History Narrative   Patient is married Multimedia programmer) and lives at home with her husband.   Patient has two adult children.   Patient  is currently not working.   Patient has a high school education.   Patient is right-handed.   Patient drinks 1-2 cups of coffee per day.   Social Determinants of Health   Financial Resource Strain:    Difficulty of Paying Living Expenses:   Food Insecurity:    Worried About Programme researcher, broadcasting/film/video in the Last Year:    Barista in the Last Year:   Transportation Needs:    Freight forwarder (Medical):    Lack of Transportation (Non-Medical):   Physical Activity:    Days of Exercise per Week:    Minutes of Exercise per Session:   Stress:    Feeling of Stress :   Social Connections:    Frequency of Communication with Friends and Family:    Frequency of Social Gatherings with Friends and Family:    Attends Religious Services:    Active Member of Clubs or Organizations:    Attends Engineer, structural:    Marital Status:   Intimate Partner Violence:    Fear of Current or Ex-Partner:    Emotionally Abused:    Physically Abused:    Sexually Abused:     Past Medical History, Surgical history, Social history, and Family history were reviewed and updated as appropriate.   Please see review of systems for further details on the patient's review from today.   Objective:   Physical Exam:  There were no vitals taken for this visit.  Physical Exam Constitutional:      General: She is not in acute distress. Musculoskeletal:        General: No deformity.  Neurological:      Mental Status: She is alert and oriented to person, place, and time.     Coordination: Coordination normal.  Psychiatric:        Attention and Perception: Attention and perception normal. She does not perceive auditory or visual hallucinations.        Mood and Affect: Mood normal. Mood is not anxious or depressed. Affect is not labile, blunt, angry or inappropriate.        Speech: Speech normal.        Behavior: Behavior normal.        Thought Content: Thought content normal. Thought content is not paranoid or delusional. Thought content does not include homicidal or suicidal ideation. Thought content does not include homicidal or suicidal plan.        Cognition and Memory: Cognition and memory normal.        Judgment: Judgment normal.     Comments: Insight intact     Lab Review:     Component Value Date/Time   NA 137 01/26/2020 0839   K 4.1 01/26/2020 0839   CL 101 01/26/2020 0839   CO2 26 01/26/2020 0839   GLUCOSE 107 (H) 01/26/2020 0839   BUN 13 01/26/2020 0839   CREATININE 0.73 01/26/2020 0839   CREATININE 0.85 01/19/2020 1507   CALCIUM 8.8 (L) 01/26/2020 0839   PROT 6.4 11/14/2019 1108   ALBUMIN 3.9 11/14/2019 1108   AST 14 11/14/2019 1108   ALT 13 11/14/2019 1108   ALKPHOS 69 11/14/2019 1108   BILITOT 0.3 11/14/2019 1108   GFRNONAA >60 01/26/2020 0839   GFRAA >60 01/26/2020 0839       Component Value Date/Time   WBC 8.9 01/26/2020 0839   RBC 4.27 01/26/2020 0839   HGB 13.6 01/26/2020 0839   HCT 40.7 01/26/2020 0839   PLT 354  01/26/2020 0839   MCV 95.3 01/26/2020 0839   MCH 31.9 01/26/2020 0839   MCHC 33.4 01/26/2020 0839   RDW 12.9 01/26/2020 0839   LYMPHSABS 2.4 09/27/2014 1325   MONOABS 0.4 09/27/2014 1325   EOSABS 0.0 09/27/2014 1325   BASOSABS 0.0 09/27/2014 1325    No results found for: POCLITH, LITHIUM   No results found for: PHENYTOIN, PHENOBARB, VALPROATE, CBMZ   .res Assessment: Plan:    Plan:  Buspar 30mg  BID Xanax 1mg  BID for a  month Celexa 20mg  daily Risperdal 1mg  at bedtime  Therapy - Rockne Menghiniebbie Dowd  Patient advised to contact office with any questions, adverse effects, or acute worsening in signs and symptoms.  Discussed potential benefits, risk, and side effects of benzodiazepines to include potential risk of tolerance and dependence, as well as possible drowsiness.  Advised patient not to drive if experiencing drowsiness and to take lowest possible effective dose to minimize risk of dependence and tolerance.  Discussed potential metabolic side effects associated with atypical antipsychotics, as well as potential risk for movement side effects. Advised pt to contact office if movement side effects occur.    Diagnoses and all orders for this visit:  Generalized anxiety disorder  PTSD (post-traumatic stress disorder)  Bipolar II disorder (HCC)  Major depressive disorder, recurrent episode, moderate (HCC)     Please see After Visit Summary for patient specific instructions.  Future Appointments  Date Time Provider Department Center  04/05/2020  3:00 PM Genice RougeLovorn, Megan, MD [facility]  05/22/2020  5:20 PM Hae Ahlers, Thereasa Soloegina Nattalie, NP [facility] None    No orders of the defined types were placed in this encounter.   -------------------------------

## 2020-04-04 ENCOUNTER — Telehealth: Payer: Self-pay | Admitting: Family Medicine

## 2020-04-04 NOTE — Telephone Encounter (Signed)
Requesting: NORCO Contract: N/A UDS: n/a Last Visit:01/19/20 Next Visit: Last Refill:  Please Advise

## 2020-04-04 NOTE — Telephone Encounter (Signed)
Medication: HYDROcodone-acetaminophen (NORCO) 7.5-325 MG tablet [[id number]]       Has the patient contacted their pharmacy?  (If no, request that the patient contact the pharmacy for the refill.) (If yes, when and what did the pharmacy advise?)     Preferred Pharmacy (with phone number or street name): [id number] [place], [place] - [address]  [address] [place] [address]  Phone:  [phone] Fax:  [phone]      Agent: Please be advised that RX refills may take up to 3 business days. We ask that you follow-up with your pharmacy.

## 2020-04-05 ENCOUNTER — Encounter
Payer: BLUE CROSS/BLUE SHIELD | Attending: Physical Medicine and Rehabilitation | Admitting: Physical Medicine and Rehabilitation

## 2020-04-05 ENCOUNTER — Encounter: Payer: Self-pay | Admitting: Physical Medicine and Rehabilitation

## 2020-04-05 ENCOUNTER — Other Ambulatory Visit: Payer: Self-pay

## 2020-04-05 VITALS — BP 165/89 | HR 85 | Temp 98.3°F | Ht 65.0 in | Wt 147.0 lb

## 2020-04-05 DIAGNOSIS — M5412 Radiculopathy, cervical region: Secondary | ICD-10-CM | POA: Diagnosis not present

## 2020-04-05 DIAGNOSIS — S13100S Subluxation of unspecified cervical vertebrae, sequela: Secondary | ICD-10-CM | POA: Diagnosis not present

## 2020-04-05 DIAGNOSIS — M797 Fibromyalgia: Secondary | ICD-10-CM | POA: Insufficient documentation

## 2020-04-05 DIAGNOSIS — M7918 Myalgia, other site: Secondary | ICD-10-CM | POA: Diagnosis not present

## 2020-04-05 DIAGNOSIS — R208 Other disturbances of skin sensation: Secondary | ICD-10-CM | POA: Insufficient documentation

## 2020-04-05 MED ORDER — HYDROCODONE-ACETAMINOPHEN 7.5-325 MG PO TABS: 1.0000 | ORAL_TABLET | Freq: Three times a day (TID) | ORAL | 0 refills | Status: DC | PRN

## 2020-04-05 NOTE — Telephone Encounter (Signed)
can you call Renu and inform her that the new medicine Dr. Birdie Riddle called in will interact w the Norco so we needed to cancel it, please?   Above message from PCP Called left message to call back.

## 2020-04-05 NOTE — Progress Notes (Signed)
Subjective:    Patient ID: [name], female    DOB: [birth year], 59 y.o.   MRN: [id number]  HPI Patient is a 59 yr old female with hx of multiple spinal surgeries- all surgeries on neck and neck and back pain, fibromyalgia, and previous L carpal tunnel surgery - here for evaluation of chronic pain.   Her Surgeon is Dr [name]- last doctor to prescribe.   Has been over a week since last meds- in so much pain.   Last surgery was 2 months- June 8th-   Has been to    Is going to start PT- hoping Integrative therapy-     Pain Inventory Average Pain 9 Pain Right Now 9 My pain is constant, sharp, burning, stabbing, tingling and aching  In the last 24 hours, has pain interfered with the following? General activity 9 Relation with others 9 Enjoyment of life 9 What TIME of day is your pain at its worst? morning , daytime, evening and night Sleep (in general) Fair  Pain is worse with: walking, bending, sitting, inactivity, standing and some activites Pain improves with: rest, heat/ice and medication Relief from Meds: 5  walk without assistance ability to climb steps?  yes do you drive?  no Do you have any goals in this area?  yes  retired I need assistance with the following:  meal prep, household duties and shopping  numbness tingling trouble walking spasms depression anxiety  new  new    Family History  Problem Relation Age of Onset  . Coronary artery disease Father   . Parkinson's disease Mother   . Cancer Sister        breast  . Diabetes Sister   . Diabetes Brother    Social History   Socioeconomic History  . Marital status: Married    Spouse name: [name]  . Number of children: 2  . Years of education: 53  . Highest education level: Not on file  Occupational History    Comment: homemaker  Tobacco Use  . Smoking status: Current Every Day Smoker    Packs/day: 0.50    Years: 25.00    Pack years: 12.50    Types: Cigarettes  . Smokeless  tobacco: Never Used  Vaping Use  . Vaping Use: Never used  Substance and Sexual Activity  . Alcohol use: Yes    Comment: very rare  . Drug use: No  . Sexual activity: Not on file  Other Topics Concern  . Not on file  Social History Narrative   Patient is married [name]) and lives at home with her husband.   Patient has two adult children.   Patient is currently not working.   Patient has a high school education.   Patient is right-handed.   Patient drinks 1-2 cups of coffee per day.   Social Determinants of Health   Financial Resource Strain:   . Difficulty of Paying Living Expenses:   Food Insecurity:   . Worried About Charity fundraiser in the Last Year:   . Arboriculturist in the Last Year:   Transportation Needs:   . Film/video editor (Medical):   Marland Kitchen Lack of Transportation (Non-Medical):   Physical Activity:   . Days of Exercise per Week:   . Minutes of Exercise per Session:   Stress:   . Feeling of Stress :   Social Connections:   . Frequency of Communication with Friends and Family:   . Frequency of Social Gatherings  with Friends and Family:   . Attends Religious Services:   . Active Member of Clubs or Organizations:   . Attends Archivist Meetings:   Marland Kitchen Marital Status:    Past Surgical History:  Procedure Laterality Date  . ABDOMINAL HYSTERECTOMY     partial  . ANTERIOR CERVICAL DECOMP/DISCECTOMY FUSION N/A 01/30/2020   Procedure: Cervical Three-FourAnterior cervical decompression/discectomy/fusion with Hardware Removal.;  Surgeon: Jeanette Miss, MD;  Location: [place] [facility];  Service: Neurosurgery;  Laterality: N/A;  anterior  . APPENDECTOMY    . CARDIAC CATHETERIZATION     Ejection Fraction 65-70%  . CERVICAL DISC ARTHROPLASTY N/A 02/21/2016   Procedure: Cervical three-four artificial disc replacement;  Surgeon: Jeanette Miss, MD;  Location: [facility];  Service: Neurosurgery;  Laterality: N/A;  . CERVICAL FUSION     C5-6  . EXTERNAL EAR SURGERY      tumor at age 28, 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  . HERNIA REPAIR     " as a baby"  . NECK SURGERY    . OTHER SURGICAL HISTORY     History of cervical and lumbar disk surgery  . PERCUTANEOUS PINNING Left 12/01/2019   Procedure: Closed reduction percutaneous pinning left ring finger proximal phalanx fracture;  Surgeon: Jeanette Presume, MD;  Location: [facility];  Service: Plastics;  Laterality: Left;  . polypectomies     tubes  . POSTERIOR CERVICAL LAMINECTOMY WITH MET- RX Left 06/13/2018   Procedure: Left Cervical three-four Laminotomy/foraminotomy;  Surgeon: Jeanette Miss, MD;  Location: [facility];  Service: Neurosurgery;  Laterality: Left;  . TONSILLECTOMY    . TONSILLECTOMY AND ADENOIDECTOMY     Past Medical History:  Diagnosis Date  . Back injury    Chronic  . Bipolar disorder ([place])   . Cardiomyopathy ([place])    hypertension controlled-helped with edema-Dr [name]  . Deafness    Left ear  . Degenerative joint disease    with neck surgery  . Depression   . Diastolic dysfunction    with possible mild LVOT gradient  . Dyspnea   . Dysrhythmia    per patient, took too much naproxen which caused an "irregular heart beat"  . Family history of adverse reaction to anesthesia    " my daughter has PONV and gets very grumpy"  . Fibromyalgia   . Former cigarette smoker   . Gestational diabetes   . H/O blood clots    due to side effect of medication  . Headache(784.0)    migraines  . Hypertension   . Hypothyroidism   . Leg cramps   . Neuropathy   . Pain    chronic   . Pneumonia   . PTSD (post-traumatic stress disorder)   . Respiratory disorder   . Schizophrenia ([place])    BP (!) 165/89   Pulse 85   Temp 98.3 F (36.8 C)   Ht '5\' 5"'$  (1.651 m)   Wt 147 lb (66.7 kg)   SpO2 98%   BMI 24.46 kg/m   Opioid Risk Score:   Fall Risk Score:  `1  Depression screen PHQ 2/9  Depression screen PHQ 2/9 04/05/2020  Decreased Interest 2  Down, Depressed, Hopeless 1  PHQ - 2 Score 3  Altered sleeping 2   Tired, decreased energy 1  Change in appetite 1  Feeling bad or failure about yourself  2  Trouble concentrating 1  Moving slowly or fidgety/restless 0  Suicidal thoughts 0  PHQ-9 Score 10  Difficult doing work/chores Very difficult  Some recent data might be hidden    Review of Systems  Constitutional: Positive for unexpected weight change.  HENT: Negative.   Eyes: Negative.   Respiratory: Negative.   Cardiovascular: Negative.   Gastrointestinal: Negative.   Endocrine: Negative.   Genitourinary: Negative.   Musculoskeletal: Positive for arthralgias, back pain and neck pain.       Spasms   Skin: Negative.   Allergic/Immunologic: Negative.   Neurological: Negative.   Hematological: Bruises/bleeds easily.  Psychiatric/Behavioral: Positive for dysphoric mood. The patient is nervous/anxious.   All other systems reviewed and are negative.      Objective:   Physical Exam  Awake, alert, appropriate, NAD Severe trigger points in scalenes, pecs, splenius capitus, upper traps, and levators,  Very TTP.      Assessment & Plan:    1. Low dose Naltrexone- 2 mg daily x 1 week- then 4 mg daily-  [place] [facility] in   2. Patient here for trigger point injections for  Consent done and on chart.  Cleaned areas with alcohol and injected using a 27 gauge 1.5 inch needle  Injected  Using 1% Lidocaine with no EPI  Upper traps B/L Levators B/L Posterior scalenes B/L x2 Middle scalenes Splenius Capitus Pectoralis Major Rhomboids Infraspinatus Teres Major/minor Thoracic paraspinals Lumbar paraspinals Other injections-    Patient's level of pain prior was 10/10 Current level of pain after injections is 7/10  There was no bleeding or complications.  Patient was advised to drink a lot of water on day after injections to flush system Will have increased soreness for 12-48 hours after injections.  Can use Lidocaine patches the day AFTER  injections Can use theracane on day of injections in places didn't inject Can use heating pad/ice 4-6 hours AFTER injections  1. 3. Dr Sharion Balloon- The survivor's Handbook to Fibromyalgia and Myofascial Pain Syndrome 2. Theracane- 2-4 minutes on each trigger point- hold firm pressure, don't massage; can get for $20-30 online- will never need replacing- youtube has great videos 3. Tennis balls- 2-5 minutes on each trigger point- buttocks, back of thighs, calves, low and mid back- can throw in dryer x1 to make softer. 4. Magnesium [phone] mg 2-3x/day for muscle tightness- titrate up until loose stools  5. Rolling pin- can use on calves, thighs and arms, and buttocks- roll slowly over muscles firmly- roll towards heart 6.  F/U in 2- 4 weeks.    Is pent a total of 50 minutes on visit- as detailed above.

## 2020-04-05 NOTE — Patient Instructions (Signed)
1. Low dose Naltrexone- 2 mg daily x 1 week- then 4 mg daily-  [address]-  [place] at [facility] in   2. Patient here for trigger point injections for  Consent done and on chart.  Cleaned areas with alcohol and injected using a 27 gauge 1.5 inch needle  Injected  Using 1% Lidocaine with no EPI  Upper traps Levators Posterior scalenes Middle scalenes Splenius Capitus Pectoralis Major Rhomboids Infraspinatus Teres Major/minor Thoracic paraspinals Lumbar paraspinals Other injections-    Patient's level of pain prior was 10/10 Current level of pain after injections is 7/10  There was no bleeding or complications.  Patient was advised to drink a lot of water on day after injections to flush system Will have increased soreness for 12-48 hours after injections.  Can use Lidocaine patches the day AFTER injections Can use theracane on day of injections in places didn't inject Can use heating pad/ice 4-6 hours AFTER injections  1. 3. Dr Nance Pew- The survivor's Handbook to Fibromyalgia and Myofascial Pain Syndrome 2. Theracane- 2-4 minutes on each trigger point- hold firm pressure, don't massage; can get for $20-30 online- will never need replacing- youtube has great videos 3. Tennis balls- 2-5 minutes on each trigger point- buttocks, back of thighs, calves, low and mid back- can throw in dryer x1 to make softer. 4. Magnesium [phone] mg 2-3x/day for muscle tightness- titrate up until loose stools  5. Rolling pin- can use on calves, thighs and arms, and buttocks- roll slowly over muscles firmly- roll towards heart 6.  F/U in 2- 4 weeks.

## 2020-04-05 NOTE — Telephone Encounter (Signed)
I was bridging her until she could have a procedure. I would recommend contacting her specialist for this. Ty.

## 2020-04-05 NOTE — Addendum Note (Signed)
Addended by: Radene Gunning on: 04/05/2020 02:34 PM   Modules accepted: Orders

## 2020-04-05 NOTE — Telephone Encounter (Signed)
Called left detailed message of PCP instructions to the patient.

## 2020-04-05 NOTE — Telephone Encounter (Signed)
Will bridge again, will not write long term though, per usual. Do not fill if pain clinic has other plans though. Ty.

## 2020-04-05 NOTE — Telephone Encounter (Signed)
She had the procedure on June 8 The specialist is not responding--told her to go to pain specialist. She sees the pain specialist today at 2--- but says it is just a consultation. She states she will not get any narcotics on the first appointment.

## 2020-04-09 ENCOUNTER — Other Ambulatory Visit: Payer: Self-pay | Admitting: Family Medicine

## 2020-04-09 MED ORDER — NAPROXEN 500 MG PO TABS: ORAL_TABLET | ORAL | 1 refills | Status: DC

## 2020-04-26 ENCOUNTER — Other Ambulatory Visit: Payer: Self-pay

## 2020-04-26 ENCOUNTER — Encounter
Payer: BLUE CROSS/BLUE SHIELD | Attending: Physical Medicine and Rehabilitation | Admitting: Physical Medicine and Rehabilitation

## 2020-04-26 ENCOUNTER — Encounter: Payer: Self-pay | Admitting: Physical Medicine and Rehabilitation

## 2020-04-26 VITALS — BP 157/88 | HR 57 | Temp 98.1°F | Ht 65.0 in | Wt 150.0 lb

## 2020-04-26 DIAGNOSIS — M5412 Radiculopathy, cervical region: Secondary | ICD-10-CM

## 2020-04-26 DIAGNOSIS — M7918 Myalgia, other site: Secondary | ICD-10-CM | POA: Diagnosis not present

## 2020-04-26 DIAGNOSIS — M797 Fibromyalgia: Secondary | ICD-10-CM | POA: Insufficient documentation

## 2020-04-26 DIAGNOSIS — S13100S Subluxation of unspecified cervical vertebrae, sequela: Secondary | ICD-10-CM | POA: Diagnosis not present

## 2020-04-26 DIAGNOSIS — R208 Other disturbances of skin sensation: Secondary | ICD-10-CM

## 2020-04-26 MED ORDER — LIDOCAINE 5 % EX PTCH: 3.0000 | MEDICATED_PATCH | CUTANEOUS | 11 refills | Status: DC

## 2020-04-26 MED ORDER — TIZANIDINE HCL 4 MG PO CAPS
4.0000 mg | ORAL_CAPSULE | Freq: Three times a day (TID) | ORAL | 11 refills | Status: DC
Start: 2020-04-26 — End: 2020-05-27

## 2020-04-26 NOTE — Progress Notes (Signed)
Patient is a 59 yr old female with hx of multiple spinal surgeries- all surgeries on neck and neck and back pain, fibromyalgia, and previous L carpal tunnel surgery - here for f/u of chronic pain.   Hasn't had any opiates in 5 weeks.  Not too shabby- hasn't hurt anyone and caring for 76 yr old grandkid.    Only side effect from Naltrexone- is nauseated sometimes- could care less if eats sometimes.    Didn't get theracane as of yet.     Plan:  1. Naltrexone 4 mg daily- 11 refills. Can change dose in future if need be    2. Lidoderm patches- 3 patches/day 12 hrs on;12 hrs off- 11 RFs   3. Zanaflex- 4 mg 3x/day for spasms- sent in Rx for this- 11 RFs.    4. Patient here for trigger point injections for  Consent done and on chart.  Cleaned areas with alcohol and injected using a 27 gauge 1.5 inch needle  Injected  4cc Using 1% Lidocaine with no EPI  Upper traps B/L Levators B/L Posterior scalenes B/L Middle scalenes Splenius Capitus b/L Pectoralis Major Rhomboids Infraspinatus Teres Major/minor Thoracic paraspinals Lumbar paraspinals Other injections-    Patient's level of pain prior was 9/10 Current level of pain after injections is 7/10  There was no bleeding or complications.  Patient was advised to drink a lot of water on day after injections to flush system Will have increased soreness for 12-48 hours after injections.  Can use Lidocaine patches the day AFTER injections Can use theracane on day of injections in places didn't inject Can use heating pad 4-6 hours AFTER injections   5. Order theracane or accucurve massage cane. To work on trigger points.   6.  F/U - 4 weeks- for trigger point injections   I spent a total of 35 minutes on visit- as detailed above.

## 2020-04-26 NOTE — Patient Instructions (Addendum)
  1. Naltrexone 4 mg daily- 11 refills. Can change dose in future if need be.    2. Lidoderm patches- 3 patches/day 12 hrs on;12 hrs off- 11 RFs   3. Zanaflex- 4 mg 3x/day for spasms- sent in Rx for this- 11 RFs.    4. Patient here for trigger point injections for  Consent done and on chart.  Cleaned areas with alcohol and injected using a 27 gauge 1.5 inch needle  Injected  4cc Using 1% Lidocaine with no EPI  Upper traps B/L Levators B/L Posterior scalenes B/L Middle scalenes Splenius Capitus b/L Pectoralis Major Rhomboids Infraspinatus Teres Major/minor Thoracic paraspinals Lumbar paraspinals Other injections-    Patient's level of pain prior was 9/10 Current level of pain after injections is 7/10  There was no bleeding or complications.  Patient was advised to drink a lot of water on day after injections to flush system Will have increased soreness for 12-48 hours after injections.  Can use Lidocaine patches the day AFTER injections Can use theracane on day of injections in places didn't inject Can use heating pad 4-6 hours AFTER injections   5. Order theracane or accucurve massage cane. To work on trigger points.

## 2020-05-08 DIAGNOSIS — M4312 Spondylolisthesis, cervical region: Secondary | ICD-10-CM | POA: Diagnosis not present

## 2020-05-22 ENCOUNTER — Ambulatory Visit (INDEPENDENT_AMBULATORY_CARE_PROVIDER_SITE_OTHER): Payer: BLUE CROSS/BLUE SHIELD | Admitting: Adult Health

## 2020-05-22 ENCOUNTER — Other Ambulatory Visit: Payer: Self-pay

## 2020-05-22 ENCOUNTER — Encounter: Payer: Self-pay | Admitting: Adult Health

## 2020-05-22 DIAGNOSIS — F411 Generalized anxiety disorder: Secondary | ICD-10-CM | POA: Diagnosis not present

## 2020-05-22 DIAGNOSIS — F4310 Post-traumatic stress disorder, unspecified: Secondary | ICD-10-CM | POA: Diagnosis not present

## 2020-05-22 DIAGNOSIS — F331 Major depressive disorder, recurrent, moderate: Secondary | ICD-10-CM

## 2020-05-22 DIAGNOSIS — F3181 Bipolar II disorder: Secondary | ICD-10-CM

## 2020-05-22 MED ORDER — ALPRAZOLAM 1 MG PO TABS: 1.0000 mg | ORAL_TABLET | Freq: Two times a day (BID) | ORAL | 2 refills | Status: DC | PRN

## 2020-05-22 MED ORDER — CITALOPRAM HYDROBROMIDE 20 MG PO TABS: 20.0000 mg | ORAL_TABLET | Freq: Every day | ORAL | 3 refills | Status: DC

## 2020-05-22 MED ORDER — RISPERIDONE 1 MG PO TABS: ORAL_TABLET | ORAL | 3 refills | Status: DC

## 2020-05-22 MED ORDER — BUSPIRONE HCL 15 MG PO TABS: 15.0000 mg | ORAL_TABLET | Freq: Three times a day (TID) | ORAL | 3 refills | Status: DC

## 2020-05-22 NOTE — Progress Notes (Signed)
[name] [id number] 14-Nov-1960 59 y.o.  Subjective:   Patient ID:  [name] is a 59 y.o. (DOB 10/23/1960) female.  Chief Complaint: No chief complaint on file.   HPI [name] presents to the office today for follow-up of MDD, GAD, PTSD, BPD 2.   Describes mood today as "ok". Pleasant. Denies tearfulness. Mood symptoms - reports decreased depression, anxiety, and irritability. Stating "I'm doing alright". Pain management has taken her off of Opiods and started her on Naltrexone. Has been receiving injections for pain. Stating "so far I'm ok with things". Does not have the energy she did, but is trying to push herself. She and husband doing well. Stable interest and motivation. Taking medications as prescribed.  Energy levels decreased. Active, does not have a regular exercise routine.  Enjoys some usual interests and activities. Married. Lives with husband of 40 years - 3 dogs. Talking to children - 1 son and 1 daughter. Spending time with family. Appetite adequate. Weight stable - 150 pounds. Sleeps better some nights than others. Averages 5 to 6 hours.  Focus and concentration stable. Completing tasks. Managing aspects of household. Disabled. Denies SI or HI. Denies AH or VH.  Previous medications - Abilify, Wellbutrin, Gabapentin, Lyrica, Prozac       PHQ2-9     Office Visit from 04/05/2020 in [facility]  PHQ-2 Total Score 3  PHQ-9 Total Score 10       Review of Systems:  Review of Systems  Musculoskeletal: Negative for gait problem.  Neurological: Negative for tremors.  Psychiatric/Behavioral:       Please refer to HPI    Medications: I have reviewed the patient's current medications.  Current Outpatient Medications  Medication Sig Dispense Refill  . acetaminophen (TYLENOL) 500 MG tablet Take 1,000 mg by mouth every 6 (six) hours as needed for mild pain or headache.     . ALPRAZolam (XANAX) 1 MG tablet  Take 1 tablet (1 mg total) by mouth 2 (two) times daily as needed for anxiety. 60 tablet 2  . bisacodyl (DULCOLAX) 5 MG EC tablet Take 5 mg by mouth 2 (two) times daily as needed for moderate constipation.     . busPIRone (BUSPAR) 15 MG tablet Take 1 tablet (15 mg total) by mouth 3 (three) times daily. 270 tablet 3  . citalopram (CELEXA) 20 MG tablet Take 1 tablet (20 mg total) by mouth daily. 90 tablet 3  . dexamethasone (DECADRON) 1 MG tablet 2 tablets twice daily for 2 days, one tablet twice daily for 2 days, one tablet daily for 2 days. 15 tablet 0  . hydrochlorothiazide (HYDRODIURIL) 25 MG tablet TAKE 1 TABLET(25 MG) BY MOUTH DAILY (Patient taking differently: Take 25 mg by mouth daily. ) 90 tablet 3  . hydrOXYzine (ATARAX/VISTARIL) 25 MG tablet TAKE 3 TO 4 TABLETS BY MOUTH EVERY 8 HOURS AS NEEDED FOR ANXIETY. MAX AMOUNT PER INSURANCE (Patient taking differently: Take 75-100 mg by mouth every 8 (eight) hours as needed for anxiety. TAKE 3 TO 4 TABLETS BY MOUTH EVERY 8 HOURS AS NEEDED FOR ANXIETY. MAX AMOUNT PER INSURANCE) 360 tablet 2  . levothyroxine (SYNTHROID) 50 MCG tablet TAKE 1 TABLET BY MOUTH EVERY DAY (Patient taking differently: Take 50 mcg by mouth daily. ) 90 tablet 0  . lidocaine (LIDODERM) 5 % Place 3 patches onto the skin daily. Remove & Discard patch within 12 hours or as directed by MD 90 patch 11  . loratadine (CLARITIN)  10 MG tablet Take 10 mg by mouth 2 (two) times daily as needed (for seasonal allergies).     . meclizine (ANTIVERT) 25 MG tablet Take 1 tablet (25 mg total) by mouth 3 (three) times daily as needed for dizziness. 30 tablet 3  . metoprolol tartrate (LOPRESSOR) 25 MG tablet Take 1 tablet (25 mg total) by mouth 2 (two) times daily. 180 tablet 3  . naproxen (NAPROSYN) 500 MG tablet TAKE 1 TAB DAILY AS NEEDED FOR PAIN. USE SPARINGLY. 30 tablet 1  . risperiDONE (RISPERDAL) 1 MG tablet Take one tablet at bedtime. 90 tablet 3  . rivaroxaban (XARELTO) 20 MG TABS tablet Take  1 tablet (20 mg total) by mouth daily with supper. (Patient taking differently: Take 20 mg by mouth daily. ) 90 tablet 2  . tiZANidine (ZANAFLEX) 4 MG capsule Take 4 mg by mouth 3 (three) times daily.    Marland Kitchen tiZANidine (ZANAFLEX) 4 MG capsule Take 1 capsule (4 mg total) by mouth 3 (three) times daily. 90 capsule 11   No current facility-administered medications for this visit.    Medication Side Effects: None  Allergies:  Allergies  Allergen Reactions  . Morphine And Related Other (See Comments)    "Didn't agree with me/wasn't very effective.."  . Lyrica [Pregabalin] Rash  . Neurontin [Gabapentin] Other (See Comments)    Left patient sleepy all the time    Past Medical History:  Diagnosis Date  . Back injury    Chronic  . Bipolar disorder (HCC)   . Cardiomyopathy (HCC)    hypertension controlled-helped with edema-Dr Nahser  . Deafness    Left ear  . Degenerative joint disease    with neck surgery  . Depression   . Diastolic dysfunction    with possible mild LVOT gradient  . Dyspnea   . Dysrhythmia    per patient, took too much naproxen which caused an "irregular heart beat"  . Family history of adverse reaction to anesthesia    " my daughter has PONV and gets very grumpy"  . Fibromyalgia   . Former cigarette smoker   . Gestational diabetes   . H/O blood clots    due to side effect of medication  . Headache(784.0)    migraines  . Hypertension   . Hypothyroidism   . Leg cramps   . Neuropathy   . Pain    chronic   . Pneumonia   . PTSD (post-traumatic stress disorder)   . Respiratory disorder   . Schizophrenia (HCC)     Family History  Problem Relation Age of Onset  . Coronary artery disease Father   . Parkinson's disease Mother   . Cancer Sister        breast  . Diabetes Sister   . Diabetes Brother     Social History   Socioeconomic History  . Marital status: Married    Spouse name: [name]  . Number of children: 2  . Years of education: 67  .  Highest education level: Not on file  Occupational History    Comment: homemaker  Tobacco Use  . Smoking status: Current Every Day Smoker    Packs/day: 0.50    Years: 25.00    Pack years: 12.50    Types: Cigarettes  . Smokeless tobacco: Never Used  Vaping Use  . Vaping Use: Never used  Substance and Sexual Activity  . Alcohol use: Yes    Comment: very rare  . Drug use: No  . Sexual activity:  Not on file  Other Topics Concern  . Not on file  Social History Narrative   Patient is married Jeanette Oka(Dan) and lives at home with her husband.   Patient has two adult children.   Patient is currently not working.   Patient has a high school education.   Patient is right-handed.   Patient drinks 1-2 cups of coffee per day.   Social Determinants of Health   Financial Resource Strain:   . Difficulty of Paying Living Expenses: Not on file  Food Insecurity:   . Worried About Programme researcher, broadcasting/film/videounning Out of Food in the Last Year: Not on file  . Ran Out of Food in the Last Year: Not on file  Transportation Needs:   . Lack of Transportation (Medical): Not on file  . Lack of Transportation (Non-Medical): Not on file  Physical Activity:   . Days of Exercise per Week: Not on file  . Minutes of Exercise per Session: Not on file  Stress:   . Feeling of Stress : Not on file  Social Connections:   . Frequency of Communication with Friends and Family: Not on file  . Frequency of Social Gatherings with Friends and Family: Not on file  . Attends Religious Services: Not on file  . Active Member of Clubs or Organizations: Not on file  . Attends BankerClub or Organization Meetings: Not on file  . Marital Status: Not on file  Intimate Partner Violence:   . Fear of Current or Ex-Partner: Not on file  . Emotionally Abused: Not on file  . Physically Abused: Not on file  . Sexually Abused: Not on file    Past Medical History, Surgical history, Social history, and Family history were reviewed and updated as appropriate.    Please see review of systems for further details on the patient's review from today.   Objective:   Physical Exam:  There were no vitals taken for this visit.  Physical Exam Constitutional:      General: She is not in acute distress. Musculoskeletal:        General: No deformity.  Neurological:     Mental Status: She is alert and oriented to person, place, and time.     Coordination: Coordination normal.  Psychiatric:        Attention and Perception: Attention and perception normal. She does not perceive auditory or visual hallucinations.        Mood and Affect: Mood normal. Mood is not anxious or depressed. Affect is not labile, blunt, angry or inappropriate.        Speech: Speech normal.        Behavior: Behavior normal.        Thought Content: Thought content normal. Thought content is not paranoid or delusional. Thought content does not include homicidal or suicidal ideation. Thought content does not include homicidal or suicidal plan.        Cognition and Memory: Cognition and memory normal.        Judgment: Judgment normal.     Comments: Insight intact     Lab Review:     Component Value Date/Time   NA 137 01/26/2020 0839   K 4.1 01/26/2020 0839   CL 101 01/26/2020 0839   CO2 26 01/26/2020 0839   GLUCOSE 107 (H) 01/26/2020 0839   BUN 13 01/26/2020 0839   CREATININE 0.73 01/26/2020 0839   CREATININE 0.85 01/19/2020 1507   CALCIUM 8.8 (L) 01/26/2020 0839   PROT 6.4 11/14/2019 1108   ALBUMIN 3.9  11/14/2019 1108   AST 14 11/14/2019 1108   ALT 13 11/14/2019 1108   ALKPHOS 69 11/14/2019 1108   BILITOT 0.3 11/14/2019 1108   GFRNONAA >60 01/26/2020 0839   GFRAA >60 01/26/2020 0839       Component Value Date/Time   WBC 8.9 01/26/2020 0839   RBC 4.27 01/26/2020 0839   HGB 13.6 01/26/2020 0839   HCT 40.7 01/26/2020 0839   PLT 354 01/26/2020 0839   MCV 95.3 01/26/2020 0839   MCH 31.9 01/26/2020 0839   MCHC 33.4 01/26/2020 0839   RDW 12.9 01/26/2020 0839    LYMPHSABS 2.4 09/27/2014 1325   MONOABS 0.4 09/27/2014 1325   EOSABS 0.0 09/27/2014 1325   BASOSABS 0.0 09/27/2014 1325    No results found for: POCLITH, LITHIUM   No results found for: PHENYTOIN, PHENOBARB, VALPROATE, CBMZ   .res Assessment: Plan:    Plan:  Buspar 30mg  BID Xanax 1mg  BID  Celexa 20mg  daily Risperdal 1mg  at bedtime  Hydrocodone dc'd and Naltrexone started  3 months  Therapy -  Patient advised to contact office with any questions, adverse effects, or acute worsening in signs and symptoms.  Discussed potential benefits, risk, and side effects of benzodiazepines to include potential risk of tolerance and dependence, as well as possible drowsiness.  Advised patient not to drive if experiencing drowsiness and to take lowest possible effective dose to minimize risk of dependence and tolerance.  Discussed potential metabolic side effects associated with atypical antipsychotics, as well as potential risk for movement side effects. Advised pt to contact office if movement side effects occur.   Diagnoses and all orders for this visit:  Major depressive disorder, recurrent episode, moderate (HCC) -     citalopram (CELEXA) 20 MG tablet; Take 1 tablet (20 mg total) by mouth daily. -     risperiDONE (RISPERDAL) 1 MG tablet; Take one tablet at bedtime.  GAD (generalized anxiety disorder) -     busPIRone (BUSPAR) 15 MG tablet; Take 1 tablet (15 mg total) by mouth 3 (three) times daily. -     ALPRAZolam (XANAX) 1 MG tablet; Take 1 tablet (1 mg total) by mouth 2 (two) times daily as needed for anxiety.  Bipolar II disorder (HCC) -     risperiDONE (RISPERDAL) 1 MG tablet; Take one tablet at bedtime.  PTSD (post-traumatic stress disorder) -     risperiDONE (RISPERDAL) 1 MG tablet; Take one tablet at bedtime.     Please see After Visit Summary for patient specific instructions.  Future Appointments  Date Time Provider Department Center  05/27/2020  2:20 PM  , MD [facility]  08/28/2020  5:20 PM Jaion Lagrange, Rockne Menghini, NP [facility] None    No orders of the defined types were placed in this encounter.   -------------------------------

## 2020-05-25 ENCOUNTER — Other Ambulatory Visit: Payer: Self-pay | Admitting: Cardiovascular Disease

## 2020-05-27 ENCOUNTER — Encounter
Payer: BLUE CROSS/BLUE SHIELD | Attending: Physical Medicine and Rehabilitation | Admitting: Physical Medicine and Rehabilitation

## 2020-05-27 ENCOUNTER — Other Ambulatory Visit: Payer: Self-pay

## 2020-05-27 ENCOUNTER — Encounter: Payer: Self-pay | Admitting: Physical Medicine and Rehabilitation

## 2020-05-27 VITALS — BP 101/69 | HR 60 | Temp 97.6°F | Ht 65.0 in | Wt 152.6 lb

## 2020-05-27 DIAGNOSIS — M5412 Radiculopathy, cervical region: Secondary | ICD-10-CM | POA: Diagnosis not present

## 2020-05-27 DIAGNOSIS — S13100S Subluxation of unspecified cervical vertebrae, sequela: Secondary | ICD-10-CM | POA: Diagnosis not present

## 2020-05-27 DIAGNOSIS — M7918 Myalgia, other site: Secondary | ICD-10-CM | POA: Insufficient documentation

## 2020-05-27 NOTE — Patient Instructions (Signed)
Plan: 1. Let's increase Naltrexone to 5 mg daily- 1 tab PO daily- with 5 RFs  2. Patient here for trigger point injections for myofascial pain   Consent done and on chart.  Cleaned areas with alcohol and injected using a 27 gauge 1.5 inch needle  Injected 3cc this time- 6 spots total Using 1% Lidocaine with no EPI  Upper traps B/L Levators Posterior scalenes Middle scalenes Splenius Capitus Pectoralis Major B/L Rhomboids B/L Infraspinatus Teres Major/minor Thoracic paraspinals Lumbar paraspinals Other injections- L supraspinatus   Patient's level of pain prior was 8-9/10 Current level of pain after injections is 7/10  There was no bleeding or complications.  Patient was advised to drink a lot of water on day after injections to flush system Will have increased soreness for 12-48 hours after injections.  Can use Lidocaine patches the day AFTER injections Can use theracane on day of injections in places didn't inject Can use heating pad 4-6 hours AFTER injections  3. F/U in 1 month

## 2020-05-27 NOTE — Progress Notes (Signed)
° °  Patient is a 59 yr old female with hx of multiple spinal surgeries- all surgeries on neck and neck and back pain, fibromyalgia, and previous L carpal tunnel surgery - here for f/u of chronic pain.   Ten trigger point injections/shots, made her more sore last visit.   R side of neck "is starting to go". Hurts occ compared to L side of neck.   Can feel the hardware- hard as a rock" on L side.   Losing motivation- 9-10 weeks since off opiates.   Was in dog fight in April 2021- right before neck surgery.  Had surgery on L hand because of it. Arthritis acts up in cold.    Lidoderm patches gives a little relief Was on Zanaflex prior- works OK for her.     Plan: 1. Let's increase Naltrexone to 5 mg daily- 1 tab PO daily- with 5 RFs  2. Patient here for trigger point injections for myofascial pain   Consent done and on chart.  Cleaned areas with alcohol and injected using a 27 gauge 1.5 inch needle  Injected 3cc this time- 6 spots total Using 1% Lidocaine with no EPI  Upper traps B/L Levators Posterior scalenes Middle scalenes Splenius Capitus Pectoralis Major B/L Rhomboids B/L Infraspinatus Teres Major/minor Thoracic paraspinals Lumbar paraspinals Other injections- L supraspinatus   Patient's level of pain prior was 8-9/10 Current level of pain after injections is 7/10  There was no bleeding or complications.  Patient was advised to drink a lot of water on day after injections to flush system Will have increased soreness for 12-48 hours after injections.  Can use Lidocaine patches the day AFTER injections Can use theracane on day of injections in places didn't inject Can use heating pad 4-6 hours AFTER injections  3. F/U in 1 month   I spent a total of 25 minutes on visit- as detailed above.

## 2020-06-05 ENCOUNTER — Other Ambulatory Visit: Payer: Self-pay | Admitting: Family Medicine

## 2020-06-21 ENCOUNTER — Other Ambulatory Visit: Payer: Self-pay | Admitting: Cardiovascular Disease

## 2020-07-02 ENCOUNTER — Other Ambulatory Visit: Payer: Self-pay | Admitting: Cardiovascular Disease

## 2020-07-04 DIAGNOSIS — Z9089 Acquired absence of other organs: Secondary | ICD-10-CM | POA: Diagnosis not present

## 2020-07-04 DIAGNOSIS — H6981 Other specified disorders of Eustachian tube, right ear: Secondary | ICD-10-CM | POA: Diagnosis not present

## 2020-07-04 DIAGNOSIS — H906 Mixed conductive and sensorineural hearing loss, bilateral: Secondary | ICD-10-CM | POA: Diagnosis not present

## 2020-07-04 DIAGNOSIS — H9213 Otorrhea, bilateral: Secondary | ICD-10-CM | POA: Diagnosis not present

## 2020-07-11 ENCOUNTER — Other Ambulatory Visit: Payer: Self-pay | Admitting: Cardiovascular Disease

## 2020-07-16 ENCOUNTER — Other Ambulatory Visit: Payer: Self-pay | Admitting: Cardiovascular Disease

## 2020-07-25 ENCOUNTER — Other Ambulatory Visit: Payer: Self-pay | Admitting: Family Medicine

## 2020-07-26 ENCOUNTER — Other Ambulatory Visit: Payer: Self-pay

## 2020-07-26 ENCOUNTER — Encounter: Payer: Self-pay | Admitting: Physical Medicine and Rehabilitation

## 2020-07-26 ENCOUNTER — Encounter
Payer: BLUE CROSS/BLUE SHIELD | Attending: Physical Medicine and Rehabilitation | Admitting: Physical Medicine and Rehabilitation

## 2020-07-26 VITALS — BP 106/64 | HR 78 | Temp 97.9°F | Ht 65.5 in | Wt 155.0 lb

## 2020-07-26 DIAGNOSIS — M7918 Myalgia, other site: Secondary | ICD-10-CM | POA: Insufficient documentation

## 2020-07-26 DIAGNOSIS — S13100S Subluxation of unspecified cervical vertebrae, sequela: Secondary | ICD-10-CM | POA: Insufficient documentation

## 2020-07-26 DIAGNOSIS — M5412 Radiculopathy, cervical region: Secondary | ICD-10-CM | POA: Diagnosis not present

## 2020-07-26 NOTE — Progress Notes (Signed)
Patient is a 59 yr old female with hx of multiple spinal surgeries- all surgeries on neck and neck and back pain, fibromyalgia, and previous L carpal tunnel surgery - here forf/uof chronic pain.   Made it-  This last/past amount of injections went [facility] better.   Pain is really bad again.  Feels like someone standing on shoulders and jabbing a knife into muscles.  L>R- but both sides are affected.   Didn't get appointment- so was 8 weeks since last visit-    Got theracane- in the beginning overused it- but still using it.    Plan: 1.  Showed pt how to do Arm myofascial release/stretches- suggest 2x/week- go slow, and pull arm through ROM of L and R UEs  2. Has enough low dose natrexone. Doesn't need refills right now.    3. Patient here for trigger point injections for  Consent done and on chart.  Cleaned areas with alcohol and injected using a 27 gauge 1.5 inch needle  Injected 3cc Using 1% Lidocaine with no EPI  Upper traps B/L Levators B/L Posterior scalenes B/L Middle scalenes Splenius Capitus Pectoralis Major Rhomboids B/L Infraspinatus Teres Major/minor Thoracic paraspinals Lumbar paraspinals Other injections-    Patient's level of pain prior was 9/10 Current level of pain after injections is 8/10- with moving arms and neck better- 1 minute afterwards  There was no bleeding or complications.  Patient was advised to drink a lot of water on day after injections to flush system Will have increased soreness for 12-48 hours after injections.  Can use Lidocaine patches the day AFTER injections Can use theracane on day of injections in places didn't inject Can use heating pad 4-6 hours AFTER injections   4. F/U in ~ 4 weeks. For trigger point injections.     I spent a total of 25 minutes on visit- as detailed above.

## 2020-07-26 NOTE — Patient Instructions (Signed)
Plan: 1.  Showed pt how to do Arm myofascial release/stretches- suggest 2x/week- go slow, and pull arm through ROM of L and R UEs  2. Has enough low dose natrexone. Doesn't need refills right now.    3. Patient here for trigger point injections for  Consent done and on chart.  Cleaned areas with alcohol and injected using a 27 gauge 1.5 inch needle  Injected 3cc Using 1% Lidocaine with no EPI  Upper traps B/L Levators B/L Posterior scalenes B/L Middle scalenes Splenius Capitus Pectoralis Major Rhomboids B/L Infraspinatus Teres Major/minor Thoracic paraspinals Lumbar paraspinals Other injections-    Patient's level of pain prior was 9/10 Current level of pain after injections is 8/10- with moving arms and neck better- 1 minute afterwards  There was no bleeding or complications.  Patient was advised to drink a lot of water on day after injections to flush system Will have increased soreness for 12-48 hours after injections.  Can use Lidocaine patches the day AFTER injections Can use theracane on day of injections in places didn't inject Can use heating pad 4-6 hours AFTER injections   4. F/U in ~ 4 weeks. For trigger point injections.

## 2020-07-30 ENCOUNTER — Other Ambulatory Visit: Payer: Self-pay | Admitting: Family Medicine

## 2020-08-02 ENCOUNTER — Other Ambulatory Visit: Payer: Self-pay | Admitting: Cardiovascular Disease

## 2020-08-12 ENCOUNTER — Emergency Department (HOSPITAL_BASED_OUTPATIENT_CLINIC_OR_DEPARTMENT_OTHER): Payer: BLUE CROSS/BLUE SHIELD

## 2020-08-12 ENCOUNTER — Emergency Department (HOSPITAL_BASED_OUTPATIENT_CLINIC_OR_DEPARTMENT_OTHER)
Admission: EM | Admit: 2020-08-12 | Discharge: 2020-08-12 | Disposition: A | Discharge status: Home / Self Care | Payer: BLUE CROSS/BLUE SHIELD | Attending: Emergency Medicine | Admitting: Emergency Medicine

## 2020-08-12 ENCOUNTER — Encounter (HOSPITAL_BASED_OUTPATIENT_CLINIC_OR_DEPARTMENT_OTHER): Payer: Self-pay | Admitting: *Deleted

## 2020-08-12 ENCOUNTER — Other Ambulatory Visit: Payer: Self-pay

## 2020-08-12 DIAGNOSIS — I110 Hypertensive heart disease with heart failure: Secondary | ICD-10-CM | POA: Insufficient documentation

## 2020-08-12 DIAGNOSIS — Z79899 Other long term (current) drug therapy: Secondary | ICD-10-CM | POA: Diagnosis not present

## 2020-08-12 DIAGNOSIS — I5030 Unspecified diastolic (congestive) heart failure: Secondary | ICD-10-CM | POA: Diagnosis not present

## 2020-08-12 DIAGNOSIS — E039 Hypothyroidism, unspecified: Secondary | ICD-10-CM | POA: Insufficient documentation

## 2020-08-12 DIAGNOSIS — F17210 Nicotine dependence, cigarettes, uncomplicated: Secondary | ICD-10-CM | POA: Insufficient documentation

## 2020-08-12 DIAGNOSIS — S6992XA Unspecified injury of left wrist, hand and finger(s), initial encounter: Secondary | ICD-10-CM | POA: Diagnosis not present

## 2020-08-12 DIAGNOSIS — S61211A Laceration without foreign body of left index finger without damage to nail, initial encounter: Secondary | ICD-10-CM | POA: Diagnosis not present

## 2020-08-12 DIAGNOSIS — W25XXXA Contact with sharp glass, initial encounter: Secondary | ICD-10-CM | POA: Diagnosis not present

## 2020-08-12 MED ORDER — LIDOCAINE HCL (PF) 1 % IJ SOLN
10.0000 mL | Freq: Once | INTRAMUSCULAR | Status: AC
  Administered 2020-08-12: 10 mL
  Filled 2020-08-12: qty 10

## 2020-08-12 MED ORDER — HYDROCODONE-ACETAMINOPHEN 5-325 MG PO TABS
2.0000 | ORAL_TABLET | Freq: Once | ORAL | Status: AC
  Administered 2020-08-12: 2 via ORAL
  Filled 2020-08-12: qty 2

## 2020-08-12 NOTE — ED Provider Notes (Signed)
[facility] EMERGENCY DEPARTMENT Provider Note   CSN: [id number] Arrival date & time: 08/12/20  1812     History Chief Complaint  Patient presents with  . Extremity Laceration    [name] is a 59 y.o. female.  Pt cut her finger on a glass ornament   The history is provided by the patient. No language interpreter was used.  Laceration Location:  Finger Finger laceration location:  L index finger Length:  1.4 Depth:  Cutaneous Bleeding: controlled   Laceration mechanism:  Broken glass Pain details:    Quality:  Aching   Timing:  Constant Foreign body present:  Unable to specify Relieved by:  Nothing Worsened by:  Nothing Tetanus status:  Up to date      Past Medical History:  Diagnosis Date  . Back injury    Chronic  . Bipolar disorder ([place])   . Cardiomyopathy ([place])    hypertension controlled-helped with edema-Dr Nahser  . Deafness    Left ear  . Degenerative joint disease    with neck surgery  . Depression   . Diastolic dysfunction    with possible mild LVOT gradient  . Dyspnea   . Dysrhythmia    per patient, took too much naproxen which caused an "irregular heart beat"  . Family history of adverse reaction to anesthesia    " my daughter has PONV and gets very grumpy"  . Fibromyalgia   . Former cigarette smoker   . Gestational diabetes   . H/O blood clots    due to side effect of medication  . Headache(784.0)    migraines  . Hypertension   . Hypothyroidism   . Leg cramps   . Neuropathy   . Pain    chronic   . Pneumonia   . PTSD (post-traumatic stress disorder)   . Respiratory disorder   . Schizophrenia [facility])     Patient Active Problem List   Diagnosis Date Noted  . Myofascial pain dysfunction syndrome 04/05/2020  . Cervical subluxation 01/30/2020  . Chronic otitis externa of right ear 07/27/2019  . Dysfunction of right eustachian tube 07/27/2019  . Chronic mycotic otitis externa 07/13/2019  . Dizziness 10/13/2018  .  History of mastoiditis 10/13/2018  . Vasculitis of skin 08/22/2018  . Cervical radiculopathy 06/13/2018  . GAD (generalized anxiety disorder) 03/09/2018  . Mixed conductive and sensorineural hearing loss of both ears 09/16/2017  . Chronic mastoiditis of left side 05/27/2017  . Fibromyalgia 06/19/2016  . Spondylosis of cervical joint 02/21/2016  . Depression 04/09/2015  . Gastritis 04/09/2015  . Uncomplicated alcohol dependence ([place]) 04/08/2015  . Hypertension 08/27/2014  . Allodynia 05/17/2013  . Chest pain syndrome 04/28/2013  . Mild HOCM (hypertrophic obstructive cardiomyopathy) ([place]) 04/28/2013  . Palpitations 04/28/2013    Past Surgical History:  Procedure Laterality Date  . ABDOMINAL HYSTERECTOMY     partial  . ANTERIOR CERVICAL DECOMP/DISCECTOMY FUSION N/A 01/30/2020   Procedure: Cervical Three-FourAnterior cervical decompression/discectomy/fusion with Hardware Removal.;  Surgeon: Kristeen Miss, MD;  Location: [facility];  Service: Neurosurgery;  Laterality: N/A;  anterior  . APPENDECTOMY    . CARDIAC CATHETERIZATION     Ejection Fraction 65-70%  . CERVICAL DISC ARTHROPLASTY N/A 02/21/2016   Procedure: Cervical three-four artificial disc replacement;  Surgeon: Kristeen Miss, MD;  Location: [place] [facility];  Service: Neurosurgery;  Laterality: N/A;  . CERVICAL FUSION     C5-6  . EXTERNAL EAR SURGERY     tumor at age 83, left ear  lost hearing  . HERNIA REPAIR     " as a baby"  . NECK SURGERY    . OTHER SURGICAL HISTORY     History of cervical and lumbar disk surgery  . PERCUTANEOUS PINNING Left 12/01/2019   Procedure: Closed reduction percutaneous pinning left ring finger proximal phalanx fracture;  Surgeon: Cindra Presume, MD;  Location: [facility];  Service: Plastics;  Laterality: Left;  . polypectomies     tubes  . POSTERIOR CERVICAL LAMINECTOMY WITH MET- RX Left 06/13/2018   Procedure: Left Cervical three-four Laminotomy/foraminotomy;  Surgeon: Kristeen Miss, MD;  Location: [facility];   Service: Neurosurgery;  Laterality: Left;  . TONSILLECTOMY    . TONSILLECTOMY AND ADENOIDECTOMY       OB History   No obstetric history on file.     Family History  Problem Relation Age of Onset  . Coronary artery disease Father   . Parkinson's disease Mother   . Cancer Sister        breast  . Diabetes Sister   . Diabetes Brother     Social History   Tobacco Use  . Smoking status: Current Every Day Smoker    Packs/day: 0.50    Years: 25.00    Pack years: 12.50    Types: Cigarettes  . Smokeless tobacco: Never Used  Vaping Use  . Vaping Use: Never used  Substance Use Topics  . Alcohol use: Yes    Comment: very rare  . Drug use: No    Home Medications Prior to Admission medications   Medication Sig Start Date End Date Taking? Authorizing Provider  acetaminophen (TYLENOL) 500 MG tablet Take 1,000 mg by mouth every 6 (six) hours as needed for mild pain or headache.     [provider]  ALPRAZolam Duanne Moron) 1 MG tablet Take 1 tablet (1 mg total) by mouth 2 (two) times daily as needed for anxiety. 05/22/20   Mozingo, Berdie Ogren, NP  bisacodyl (DULCOLAX) 5 MG EC tablet Take 5 mg by mouth 2 (two) times daily as needed for moderate constipation.     [provider]  busPIRone (BUSPAR) 15 MG tablet Take 1 tablet (15 mg total) by mouth 3 (three) times daily. 05/22/20   Mozingo, Berdie Ogren, NP  citalopram (CELEXA) 20 MG tablet Take 1 tablet (20 mg total) by mouth daily. 05/22/20   Mozingo, Berdie Ogren, NP  dexamethasone (DECADRON) 1 MG tablet 2 tablets twice daily for 2 days, one tablet twice daily for 2 days, one tablet daily for 2 days. 01/30/20   Kristeen Miss, MD  hydrochlorothiazide (HYDRODIURIL) 25 MG tablet Take 1 tablet (25 mg total) by mouth daily. APPOINTMENT NEEDED FOR FURTHER REFILLS CALL [phone] 07/03/20   Nahser, Wonda Cheng, MD  HYDROcodone-acetaminophen (NORCO/VICODIN) 5-325 MG tablet Take 1 tablet by mouth every 8 (eight) hours as needed.  02/07/20   [provider]  hydrOXYzine (ATARAX/VISTARIL) 25 MG tablet TAKE 3 TO 4 TABLETS BY MOUTH EVERY 8 HOURS AS NEEDED FOR ANXIETY. MAX AMOUNT PER INSURANCE Patient taking differently: Take 75-100 mg by mouth every 8 (eight) hours as needed for anxiety. TAKE 3 TO 4 TABLETS BY MOUTH EVERY 8 HOURS AS NEEDED FOR ANXIETY. MAX AMOUNT PER INSURANCE 05/02/19   Shelda Pal, DO  levothyroxine (SYNTHROID) 50 MCG tablet TAKE 1 TABLET BY MOUTH EVERY DAY Patient taking differently: Take 50 mcg by mouth daily.  02/14/19   Shelda Pal, DO  lidocaine (LIDODERM) 5 % Place 3 patches onto the  skin daily. Remove & Discard patch within 12 hours or as directed by MD 04/26/20   Courtney Heys, MD  loratadine (CLARITIN) 10 MG tablet Take 10 mg by mouth 2 (two) times daily as needed (for seasonal allergies).     [provider]  meclizine (ANTIVERT) 25 MG tablet Take 1 tablet (25 mg total) by mouth 3 (three) times daily as needed for dizziness. 10/07/18   Wendling, Crosby Oyster, DO  methocarbamol (ROBAXIN) 500 MG tablet TAKE 1 TABLET (500 MG TOTAL) BY MOUTH EVERY 8 (EIGHT) HOURS AS NEEDED FOR MUSCLE SPASMS. 07/29/20   Shelda Pal, DO  metoprolol tartrate (LOPRESSOR) 25 MG tablet TAKE 1 TABLET BY MOUTH TWICE A DAY 07/16/20   Nahser, Wonda Cheng, MD  naproxen (NAPROSYN) 500 MG tablet TAKE 1 TAB DAILY AS NEEDED FOR PAIN. USE SPARINGLY. 07/30/20   Shelda Pal, DO  risperiDONE (RISPERDAL) 1 MG tablet Take one tablet at bedtime. 05/22/20   Mozingo, Berdie Ogren, NP  rivaroxaban (XARELTO) 20 MG TABS tablet Take 1 tablet (20 mg total) by mouth daily with supper. Patient taking differently: Take 20 mg by mouth daily.  11/14/19   Shelda Pal, DO  tiZANidine (ZANAFLEX) 4 MG capsule Take 4 mg by mouth 3 (three) times daily.    [provider]    Allergies    Morphine and related, Lyrica [pregabalin], and Neurontin [gabapentin]  Review of Systems    Review of Systems  All other systems reviewed and are negative.   Physical Exam Updated Vital Signs BP (!) 152/74   Pulse 78   Temp 98 F (36.7 C)   Resp 16   Ht 5' 5.5" (1.664 m)   Wt 65.8 kg   SpO2 99%   BMI 23.76 kg/m   Physical Exam Vitals and nursing note reviewed.  Constitutional:      Appearance: She is well-developed and well-nourished.  HENT:     Head: Normocephalic.  Eyes:     Extraocular Movements: EOM normal.  Pulmonary:     Effort: Pulmonary effort is normal.  Abdominal:     General: There is no distension.  Musculoskeletal:        General: Tenderness present.     Cervical back: Normal range of motion.     Comments: 1.2 cm laceration left index finger   Skin:    General: Skin is warm.  Neurological:     General: No focal deficit present.     Mental Status: She is alert and oriented to person, place, and time.  Psychiatric:        Mood and Affect: Mood and affect normal.     ED Results / Procedures / Treatments   Labs (all labs ordered are listed, but only abnormal results are displayed) Labs Reviewed - No data to display  EKG None  Radiology DG Finger Index Left  Result Date: 08/12/2020 CLINICAL DATA:  Broken glass laceration EXAM: LEFT INDEX FINGER 2+V COMPARISON:  12/27/2019 FINDINGS: Frontal, oblique, and lateral views of the left second digit are obtained. There are no acute displaced fractures. No radiopaque foreign bodies. Laceration is identified radial aspect base of the second digit. Stable osteoarthritis. IMPRESSION: 1. Laceration base of the second digit.  No radiopaque foreign body. Electronically Signed   By: Randa Ngo M.D.   On: 08/12/2020 18:44    Procedures .Marland KitchenLaceration Repair  Date/Time: 08/12/2020 10:52 PM Performed by: Fransico Meadow, PA-C Authorized by: Fransico Meadow, PA-C   Consent:  Consent obtained:  Verbal   Consent given by:  Patient   Risks discussed:  Infection, need for additional repair, pain,  poor cosmetic result and poor wound healing   Alternatives discussed:  No treatment and delayed treatment Universal protocol:    Procedure explained and questions answered to patient or proxy's satisfaction: yes     Relevant documents present and verified: yes     Test results available: yes     Imaging studies available: yes     Required blood products, implants, devices, and special equipment available: yes     Site/side marked: yes     Immediately prior to procedure, a time out was called: yes     Patient identity confirmed:  Verbally with patient Anesthesia:    Anesthesia method:  Local infiltration Laceration details:    Location:  Finger   Finger location:  L index finger   Length (cm):  1.2 Pre-procedure details:    Preparation:  Patient was prepped and draped in usual sterile fashion Exploration:    Imaging obtained: x-ray     Imaging outcome: foreign body not noted     Contaminated: no   Treatment:    Area cleansed with:  Povidone-iodine   Irrigation solution:  Sterile saline   Visualized foreign bodies/material removed: no     Debridement:  None Skin repair:    Repair method:  Sutures   Suture size:  5-0   Suture material:  Prolene   Suture technique:  Simple interrupted   Number of sutures:  3 Approximation:    Approximation:  Loose Repair type:    Repair type:  Simple Post-procedure details:    Procedure completion:  Tolerated   (including critical care time)  Medications Ordered in ED Medications  HYDROcodone-acetaminophen (NORCO/VICODIN) 5-325 MG per tablet 2 tablet (2 tablets Oral Given 08/12/20 2148)  lidocaine (PF) (XYLOCAINE) 1 % injection 10 mL (10 mLs Infiltration Given 08/12/20 2148)    ED Course  I have reviewed the triage vital signs and the nursing notes.  Pertinent labs & imaging results that were available during my care of the patient were reviewed by me and considered in my medical decision making (see chart for details).    MDM  Rules/Calculators/A&P                          MDM:  Suture remoavl in 8 days  Final Clinical Impression(s) / ED Diagnoses Final diagnoses:  Laceration of left index finger without foreign body, nail damage status unspecified, initial encounter    Rx / DC Orders ED Discharge Orders    None    An After Visit Summary was printed and given to the patient.    Fransico Meadow, [place] 08/12/20 2254    Lucrezia Starch, MD 08/15/20 1447

## 2020-08-12 NOTE — Discharge Instructions (Signed)
Suture removal in 8 days  °

## 2020-08-12 NOTE — ED Triage Notes (Signed)
C/o left index finger lac by broken glass x 3 hrs ago

## 2020-08-14 ENCOUNTER — Telehealth: Payer: Self-pay

## 2020-08-14 NOTE — Telephone Encounter (Signed)
[name] would like something called in for pain relief. She c/o right shoulder, neck & arm pain.  Call back ph [phone].

## 2020-08-28 ENCOUNTER — Encounter: Payer: Self-pay | Admitting: Adult Health

## 2020-08-28 ENCOUNTER — Other Ambulatory Visit: Payer: Self-pay

## 2020-08-28 ENCOUNTER — Ambulatory Visit (INDEPENDENT_AMBULATORY_CARE_PROVIDER_SITE_OTHER): Payer: BLUE CROSS/BLUE SHIELD | Admitting: Adult Health

## 2020-08-28 DIAGNOSIS — F4310 Post-traumatic stress disorder, unspecified: Secondary | ICD-10-CM

## 2020-08-28 DIAGNOSIS — F411 Generalized anxiety disorder: Secondary | ICD-10-CM

## 2020-08-28 DIAGNOSIS — F331 Major depressive disorder, recurrent, moderate: Secondary | ICD-10-CM | POA: Diagnosis not present

## 2020-08-28 DIAGNOSIS — F3181 Bipolar II disorder: Secondary | ICD-10-CM | POA: Diagnosis not present

## 2020-08-28 MED ORDER — ALPRAZOLAM 1 MG PO TABS: 1.0000 mg | ORAL_TABLET | Freq: Two times a day (BID) | ORAL | 2 refills | Status: DC | PRN

## 2020-08-28 MED ORDER — DULOXETINE HCL 30 MG PO CPEP: 30.0000 mg | ORAL_CAPSULE | Freq: Every day | ORAL | 5 refills | Status: DC

## 2020-08-28 NOTE — Progress Notes (Signed)
GAYL IVANOFF [id number] 02/19/1961 60 y.o.  Subjective:   Patient ID:  [name] is a 60 y.o. (DOB 04-25-1961) female.  Chief Complaint: No chief complaint on file.   HPI [name] presents to the office today for follow-up of MDD, GAD, PTSD, BPD 2.   Describes mood today as "ok". Pleasant. Denies tearfulness. Mood symptoms - reports decreased depression, anxiety, and irritability. Stating "I'm doing ok, I really am". Also stating "I have my moments". Would like to try a low dose of Cymbalta for mood and pain symptoms. Working with pain management - taking Naltrexone. Getting injections every 4 to 6 weeks. She and husband doing well. Stable interest and motivation. Taking medications as prescribed.  Energy levels decreased - I don't have any get up and go". Active, does not have a regular exercise routine.  Enjoys some usual interests and activities. Married. Lives with husband of 40 years - 3 dogs. Talking to children - 1 son and 1 daughter. Spending time with family. Appetite adequate. Weight stable - 145 pounds. Sleeps better some nights than others. Averages 5 hours. Napping in the mornings. Focus and concentration stable. Completing tasks. Managing aspects of household. Disabled. Denies SI or HI.  Denies AH or VH.  Previous medications - Abilify, Wellbutrin, Gabapentin, Lyrica, Prozac     PHQ2-9   Flowsheet Row Office Visit from 05/27/2020 in [facility] Office Visit from 04/05/2020 in [facility] Medicine [facility]  PHQ-2 Total Score 2 3  PHQ-9 Total Score -- 10       Review of Systems:  Review of Systems  Musculoskeletal: Negative for gait problem.  Neurological: Negative for tremors.  Psychiatric/Behavioral:       Please refer to HPI    Medications: I have reviewed the patient's current medications.  Current Outpatient Medications  Medication Sig Dispense Refill  . DULoxetine (CYMBALTA) 30 MG  capsule Take 1 capsule (30 mg total) by mouth daily. 30 capsule 5  . acetaminophen (TYLENOL) 500 MG tablet Take 1,000 mg by mouth every 6 (six) hours as needed for mild pain or headache.     . ALPRAZolam (XANAX) 1 MG tablet Take 1 tablet (1 mg total) by mouth 2 (two) times daily as needed for anxiety. 60 tablet 2  . bisacodyl (DULCOLAX) 5 MG EC tablet Take 5 mg by mouth 2 (two) times daily as needed for moderate constipation.     . busPIRone (BUSPAR) 15 MG tablet Take 1 tablet (15 mg total) by mouth 3 (three) times daily. 270 tablet 3  . citalopram (CELEXA) 20 MG tablet Take 1 tablet (20 mg total) by mouth daily. 90 tablet 3  . dexamethasone (DECADRON) 1 MG tablet 2 tablets twice daily for 2 days, one tablet twice daily for 2 days, one tablet daily for 2 days. 15 tablet 0  . hydrochlorothiazide (HYDRODIURIL) 25 MG tablet Take 1 tablet (25 mg total) by mouth daily. APPOINTMENT NEEDED FOR FURTHER REFILLS CALL [phone] 15 tablet 0  . HYDROcodone-acetaminophen (NORCO/VICODIN) 5-325 MG tablet Take 1 tablet by mouth every 8 (eight) hours as needed.    . hydrOXYzine (ATARAX/VISTARIL) 25 MG tablet TAKE 3 TO 4 TABLETS BY MOUTH EVERY 8 HOURS AS NEEDED FOR ANXIETY. MAX AMOUNT PER INSURANCE (Patient taking differently: Take 75-100 mg by mouth every 8 (eight) hours as needed for anxiety. TAKE 3 TO 4 TABLETS BY MOUTH EVERY 8 HOURS AS NEEDED FOR ANXIETY. MAX AMOUNT PER INSURANCE) 360 tablet 2  .  levothyroxine (SYNTHROID) 50 MCG tablet TAKE 1 TABLET BY MOUTH EVERY DAY (Patient taking differently: Take 50 mcg by mouth daily. ) 90 tablet 0  . lidocaine (LIDODERM) 5 % Place 3 patches onto the skin daily. Remove & Discard patch within 12 hours or as directed by MD 90 patch 11  . loratadine (CLARITIN) 10 MG tablet Take 10 mg by mouth 2 (two) times daily as needed (for seasonal allergies).     . meclizine (ANTIVERT) 25 MG tablet Take 1 tablet (25 mg total) by mouth 3 (three) times daily as needed for dizziness. 30 tablet  3  . methocarbamol (ROBAXIN) 500 MG tablet TAKE 1 TABLET (500 MG TOTAL) BY MOUTH EVERY 8 (EIGHT) HOURS AS NEEDED FOR MUSCLE SPASMS. 90 tablet 2  . metoprolol tartrate (LOPRESSOR) 25 MG tablet TAKE 1 TABLET BY MOUTH TWICE A DAY 30 tablet 0  . naproxen (NAPROSYN) 500 MG tablet TAKE 1 TAB DAILY AS NEEDED FOR PAIN. USE SPARINGLY. 30 tablet 1  . risperiDONE (RISPERDAL) 1 MG tablet Take one tablet at bedtime. 90 tablet 3  . rivaroxaban (XARELTO) 20 MG TABS tablet Take 1 tablet (20 mg total) by mouth daily with supper. (Patient taking differently: Take 20 mg by mouth daily. ) 90 tablet 2  . tiZANidine (ZANAFLEX) 4 MG capsule Take 4 mg by mouth 3 (three) times daily.     No current facility-administered medications for this visit.    Medication Side Effects: None  Allergies:  Allergies  Allergen Reactions  . Morphine And Related Other (See Comments)    "Didn't agree with me/wasn't very effective.."  . Lyrica [Pregabalin] Rash  . Neurontin [Gabapentin] Other (See Comments)    Left patient sleepy all the time    Past Medical History:  Diagnosis Date  . Back injury    Chronic  . Bipolar disorder (HCC)   . Cardiomyopathy (H[facility])    hypertension controlled-helped with edema-Dr Nahser  . Deafness    Left ear  . Degenerative joint disease    with neck surgery  . Depression   . Diastolic dysfunction    with possible mild LVOT gradient  . Dyspnea   . Dysrhythmia    per patient, took too much naproxen which caused an "irregular heart beat"  . Family history of adverse reaction to anesthesia    " my daughter has PONV and gets very grumpy"  . Fibromyalgia   . Former cigarette smoker   . Gestational diabetes   . H/O blood clots    due to side effect of medication  . Headache(784.0)    migraines  . Hypertension   . Hypothyroidism   . Leg cramps   . Neuropathy   . Pain    chronic   . Pneumonia   . PTSD (post-traumatic stress disorder)   . Respiratory disorder   . Schizophrenia (H[facility])      Family History  Problem Relation Age of Onset  . Coronary artery disease Father   . Parkinson's disease Mother   . Cancer Sister        breast  . Diabetes Sister   . Diabetes Brother     Social History   Socioeconomic History  . Marital status: Married    Spouse name: Reuel Boom  . Number of children: 2  . Years of education: 10  . Highest education level: Not on file  Occupational History    Comment: homemaker  Tobacco Use  . Smoking status: Current Every Day Smoker  Packs/day: 0.50    Years: 25.00    Pack years: 12.50    Types: Cigarettes  . Smokeless tobacco: Never Used  Vaping Use  . Vaping Use: Never used  Substance and Sexual Activity  . Alcohol use: Yes    Comment: very rare  . Drug use: No  . Sexual activity: Not on file  Other Topics Concern  . Not on file  Social History Narrative   Patient is married [name]) and lives at home with her husband.   Patient has two adult children.   Patient is currently not working.   Patient has a high school education.   Patient is right-handed.   Patient drinks 1-2 cups of coffee per day.   Social Determinants of Health   Financial Resource Strain: Not on file  Food Insecurity: Not on file  Transportation Needs: Not on file  Physical Activity: Not on file  Stress: Not on file  Social Connections: Not on file  Intimate Partner Violence: Not on file    Past Medical History, Surgical history, Social history, and Family history were reviewed and updated as appropriate.   Please see review of systems for further details on the patient's review from today.   Objective:   Physical Exam:  There were no vitals taken for this visit.  Physical Exam Constitutional:      General: She is not in acute distress. Musculoskeletal:        General: No deformity.  Neurological:     Mental Status: She is alert and oriented to person, place, and time.     Coordination: Coordination normal.  Psychiatric:        Attention  and Perception: Attention and perception normal. She does not perceive auditory or visual hallucinations.        Mood and Affect: Mood normal. Mood is not anxious or depressed. Affect is not labile, blunt, angry or inappropriate.        Speech: Speech normal.        Behavior: Behavior normal.        Thought Content: Thought content normal. Thought content is not paranoid or delusional. Thought content does not include homicidal or suicidal ideation. Thought content does not include homicidal or suicidal plan.        Cognition and Memory: Cognition and memory normal.        Judgment: Judgment normal.     Comments: Insight intact     Lab Review:     Component Value Date/Time   NA 137 01/26/2020 0839   K 4.1 01/26/2020 0839   CL 101 01/26/2020 0839   CO2 26 01/26/2020 0839   GLUCOSE 107 (H) 01/26/2020 0839   BUN 13 01/26/2020 0839   CREATININE 0.73 01/26/2020 0839   CREATININE 0.85 01/19/2020 1507   CALCIUM 8.8 (L) 01/26/2020 0839   PROT 6.4 11/14/2019 1108   ALBUMIN 3.9 11/14/2019 1108   AST 14 11/14/2019 1108   ALT 13 11/14/2019 1108   ALKPHOS 69 11/14/2019 1108   BILITOT 0.3 11/14/2019 1108   GFRNONAA >60 01/26/2020 0839   GFRAA >60 01/26/2020 0839       Component Value Date/Time   WBC 8.9 01/26/2020 0839   RBC 4.27 01/26/2020 0839   HGB 13.6 01/26/2020 0839   HCT 40.7 01/26/2020 0839   PLT 354 01/26/2020 0839   MCV 95.3 01/26/2020 0839   MCH 31.9 01/26/2020 0839   MCHC 33.4 01/26/2020 0839   RDW 12.9 01/26/2020 0839   LYMPHSABS 2.4  09/27/2014 1325   MONOABS 0.4 09/27/2014 1325   EOSABS 0.0 09/27/2014 1325   BASOSABS 0.0 09/27/2014 1325    No results found for: POCLITH, LITHIUM   No results found for: PHENYTOIN, PHENOBARB, VALPROATE, CBMZ   .res Assessment: Plan:    Plan:  Buspar 30mg  BID Xanax 1mg  BID  Celexa 20mg  daily Risperdal 1mg  at bedtime Add Cymbalta 30mg  daily  Naltrexone started  3 months  Therapy -  Patient advised to  contact office with any questions, adverse effects, or acute worsening in signs and symptoms.  Discussed potential benefits, risk, and side effects of benzodiazepines to include potential risk of tolerance and dependence, as well as possible drowsiness.  Advised patient not to drive if experiencing drowsiness and to take lowest possible effective dose to minimize risk of dependence and tolerance.  Discussed potential metabolic side effects associated with atypical antipsychotics, as well as potential risk for movement side effects. Advised pt to contact office if movement side effects occur.      Diagnoses and all orders for this visit:  Major depressive disorder, recurrent episode, moderate (HCC) -     DULoxetine (CYMBALTA) 30 MG capsule; Take 1 capsule (30 mg total) by mouth daily.  GAD (generalized anxiety disorder) -     ALPRAZolam (XANAX) 1 MG tablet; Take 1 tablet (1 mg total) by mouth 2 (two) times daily as needed for anxiety. -     DULoxetine (CYMBALTA) 30 MG capsule; Take 1 capsule (30 mg total) by mouth daily.  PTSD (post-traumatic stress disorder)  Bipolar II disorder (HCC)     Please see After Visit Summary for patient specific instructions.  Future Appointments  Date Time Provider Department Center  09/23/2020 10:20 AM , MD [facility] CPR  11/27/2020  5:20 PM Tamsyn Owusu, , NP [facility] None    No orders of the defined types were placed in this encounter.   -------------------------------

## 2020-09-06 ENCOUNTER — Ambulatory Visit: Payer: BLUE CROSS/BLUE SHIELD | Admitting: Physical Medicine and Rehabilitation

## 2020-09-15 ENCOUNTER — Other Ambulatory Visit: Payer: Self-pay | Admitting: Family Medicine

## 2020-09-20 ENCOUNTER — Other Ambulatory Visit: Payer: Self-pay | Admitting: Adult Health

## 2020-09-20 DIAGNOSIS — F331 Major depressive disorder, recurrent, moderate: Secondary | ICD-10-CM

## 2020-09-20 DIAGNOSIS — F411 Generalized anxiety disorder: Secondary | ICD-10-CM

## 2020-09-23 ENCOUNTER — Other Ambulatory Visit: Payer: Self-pay

## 2020-09-23 ENCOUNTER — Encounter: Payer: Self-pay | Admitting: Physical Medicine and Rehabilitation

## 2020-09-23 ENCOUNTER — Encounter
Payer: BLUE CROSS/BLUE SHIELD | Attending: Physical Medicine and Rehabilitation | Admitting: Physical Medicine and Rehabilitation

## 2020-09-23 VITALS — BP 138/82 | HR 48 | Temp 97.9°F | Ht 65.5 in | Wt 152.6 lb

## 2020-09-23 DIAGNOSIS — M5412 Radiculopathy, cervical region: Secondary | ICD-10-CM | POA: Diagnosis not present

## 2020-09-23 DIAGNOSIS — M797 Fibromyalgia: Secondary | ICD-10-CM

## 2020-09-23 DIAGNOSIS — S13100S Subluxation of unspecified cervical vertebrae, sequela: Secondary | ICD-10-CM

## 2020-09-23 DIAGNOSIS — M7918 Myalgia, other site: Secondary | ICD-10-CM | POA: Diagnosis not present

## 2020-09-23 NOTE — Progress Notes (Signed)
Patient is a 60 yr old female with hx of multiple spinal surgeries- all surgeries on neck and neck and back pain, fibromyalgia, and previous L carpal tunnel surgery - here forf/uof chronic pain.  Worse because it took so long to see me again.   And weather has also made pain worse.   Tension has gone down the back More on R side than L side.   Still using theracane- a lot.   Still doing Low dose naltrexone- has refills.  Working pretty well.    Likes the stretches- arm stretches.  But needs someone to do it.   Plan: 1. Patient here for trigger point injections for  Consent done and on chart.  Cleaned areas with alcohol and injected using a 27 gauge 1.5 inch needle  Injected  4cc Using 1% Lidocaine with no EPI  Upper traps B/L Levators B/L Posterior scalenes B/L Middle scalenes Splenius Capitus B/L Pectoralis Major declined Rhomboids Infraspinatus Teres Major/minor Thoracic paraspinals Lumbar paraspinals Other injections-    Patient's level of pain prior was 9/10 Current level of pain after injections is at least 7/10.   There was no bleeding or complications.  Patient was advised to drink a lot of water on day after injections to flush system Will have increased soreness for 12-48 hours after injections.  Can use Lidocaine patches the day AFTER injections Can use theracane on day of injections in places didn't inject Can use heating pad 4-6 hours AFTER injections  2. Showed how to do arm/pec stretches by herself. Keep 2nd/3rd digit off the wall.s start at 90 degrees arm on wall- and work upwards, not sideways.     3. Low dose naltrexone- (LDN) now on 5 mg low dose naltrexone- doing well overall.  The max dose of LDN is 6 mg daily- so will leave a little wiggle room.   4. Wait on Norco,etc- for now- will need to do opiate contract if started.   5. F/U 4 weeks. For trigger points injections.   I spent a total of 25 minutes on visit- as detailed above.

## 2020-09-23 NOTE — Patient Instructions (Signed)
Plan: 1. Patient here for trigger point injections for  Consent done and on chart.  Cleaned areas with alcohol and injected using a 27 gauge 1.5 inch needle  Injected  4cc Using 1% Lidocaine with no EPI  Upper traps B/L Levators B/L Posterior scalenes B/L Middle scalenes Splenius Capitus B/L Pectoralis Major declined Rhomboids Infraspinatus Teres Major/minor Thoracic paraspinals Lumbar paraspinals Other injections-    Patient's level of pain prior was 9/10 Current level of pain after injections is at least 7/10.   There was no bleeding or complications.  Patient was advised to drink a lot of water on day after injections to flush system Will have increased soreness for 12-48 hours after injections.  Can use Lidocaine patches the day AFTER injections Can use theracane on day of injections in places didn't inject Can use heating pad 4-6 hours AFTER injections  2. Showed how to do arm/pec stretches by herself. Keep 2nd/3rd digit off the wall.s start at 90 degrees arm on wall- and work upwards, not sideways.     3. Low dose naltrexone- (LDN) now on 5 mg low dose naltrexone- doing well overall.  The max dose of LDN is 6 mg daily- so will leave a little wiggle room.   4. Wait on Norco,etc- for now- will need to do opiate contract if started.   5. F/U 4 weeks. For trigger points injections.

## 2020-09-24 ENCOUNTER — Encounter: Payer: Self-pay | Admitting: Occupational Therapy

## 2020-09-24 NOTE — Therapy (Signed)
[facility] [address] [place], [place], [address] Phone: [phone]   Fax:  [phone]  September 24, 2020    No Recipients  Occupational Therapy Discharge Summary   Patient: [name] MRN: [id number] Date of Birth: Nov 04, 1960  Diagnosis: No diagnosis found.  Referring Provider (OT): Dr Arita Miss  Patient has not returned since 01/15/20.    Sincerely,  Gellert, Lanice Shirts, OT  CC No Recipients  [facility] [address] [place], [place], [address] Phone: [phone]   Fax:  [phone]  Patient: [name] MRN: [id number] Date of Birth: 23-Oct-1960

## 2020-09-25 ENCOUNTER — Other Ambulatory Visit: Payer: Self-pay | Admitting: Family Medicine

## 2020-10-02 ENCOUNTER — Telehealth: Payer: Self-pay

## 2020-10-02 ENCOUNTER — Other Ambulatory Visit: Payer: Self-pay

## 2020-10-02 MED ORDER — TIZANIDINE HCL 4 MG PO CAPS: 4.0000 mg | ORAL_CAPSULE | Freq: Three times a day (TID) | ORAL | 3 refills | Status: DC

## 2020-10-02 NOTE — Telephone Encounter (Signed)
rx sent

## 2020-10-28 ENCOUNTER — Encounter: Payer: Self-pay | Admitting: Physical Medicine and Rehabilitation

## 2020-10-28 ENCOUNTER — Encounter
Payer: BLUE CROSS/BLUE SHIELD | Attending: Physical Medicine and Rehabilitation | Admitting: Physical Medicine and Rehabilitation

## 2020-10-28 ENCOUNTER — Other Ambulatory Visit: Payer: Self-pay

## 2020-10-28 VITALS — BP 131/85 | HR 97 | Temp 98.6°F | Ht 65.5 in | Wt 153.6 lb

## 2020-10-28 DIAGNOSIS — M797 Fibromyalgia: Secondary | ICD-10-CM | POA: Diagnosis not present

## 2020-10-28 DIAGNOSIS — M5412 Radiculopathy, cervical region: Secondary | ICD-10-CM | POA: Insufficient documentation

## 2020-10-28 DIAGNOSIS — M7918 Myalgia, other site: Secondary | ICD-10-CM | POA: Insufficient documentation

## 2020-10-28 NOTE — Patient Instructions (Signed)
Patient is a 61 yr old female with hx of multiple spinal surgeries- all surgeries on neck and neck and back pain, fibromyalgia, and previous L carpal tunnel surgery - here forf/uof chronic pain.  1. Reshowed her how to do arm/pec stretches-  Start hand flat on wall- at 90 degrees- and then if not enough stretch, pull 3rd and 4th finger off the wall.  And then work up wall   2. Don't go out and push it! All at once- is a big NO!!! Add 5 minutes more/day at max-  More frequent but less duration.  Because you will pay for it for 2- 4 days.   3. Patient here for trigger point injections for  Consent done and on chart.  Cleaned areas with alcohol and injected using a 27 gauge 1.5 inch needle  Injected  3cc Using 1% Lidocaine with no EPI  Upper traps  B/L Levators B/L Posterior scalenes R only Middle scalenes Splenius Capitus Pectoralis Major Rhomboids B/L- R>L Infraspinatus Teres Major/minor Thoracic paraspinals Lumbar paraspinals Other injections-    Patient's level of pain prior was  8/10 Current level of pain after injections is- still 8/10- to 7.5/10- appears more ginger putting shirt on- better ROM of neck and shoulders.   There was no bleeding or complications.  Patient was advised to drink a lot of water on day after injections to flush system Will have increased soreness for 12-48 hours after injections.  Can use Lidocaine patches the day AFTER injections Can use theracane on day of injections in places didn't inject Can use heating pad 4-6 hours AFTER injections  4. Con't LOW dose naltrexone 5 mg daily.  They will mail it to you if you call.  Hurting more because on lower dose of Naltrexone right now since ran out of 5 mg daily.   5. F/U in 1 months for trigger point injections.

## 2020-10-28 NOTE — Progress Notes (Addendum)
Patient is a 60 yr old female with hx of multiple spinal surgeries- all surgeries on neck and neck and back pain, fibromyalgia, and previous L carpal tunnel surgery - here forf/uof chronic pain.  Going through "phase"- frustrated about having FMS and Myofascial pain and cervical stenosis- "all of it" and dog had to be put down.     Sees surgeon in 2 weeks. To get xrays.   Trying to walk a little more.  Sore when got home- back and neck - >30 minutes.    Exam: Awake, alert, appropriate, a little ginger in her gait, NAD Trigger points in neck, shoulders and upper back B/L  Plan: Patient is a 60 yr old female with hx of multiple spinal surgeries- all surgeries on neck and neck and back pain, fibromyalgia, and previous L carpal tunnel surgery - here forf/uof chronic pain.  1. Reshowed her how to do arm/pec stretches-  Start hand flat on wall- at 90 degrees- and then if not enough stretch, pull 3rd and 4th finger off the wall.  And then work up wall   2. Don't go out and push it! All at once- is a big NO!!! Add 5 minutes more/day at max-  More frequent but less duration.  Because you will pay for it for 2- 4 days.   3. Patient here for trigger point injections for  Consent done and on chart.  Cleaned areas with alcohol and injected using a 27 gauge 1.5 inch needle  Injected  3cc Using 1% Lidocaine with no EPI  Upper traps  B/L Levators B/L Posterior scalenes R only Middle scalenes Splenius Capitus Pectoralis Major Rhomboids B/L- R>L Infraspinatus Teres Major/minor Thoracic paraspinals Lumbar paraspinals Other injections-    Patient's level of pain prior was  8/10 Current level of pain after injections is- still 8/10- to 7.5/10- appears more ginger putting shirt on- better ROM of neck and shoulders.   There was no bleeding or complications.  Patient was advised to drink a lot of water on day after injections to flush system Will have increased soreness for 12-48  hours after injections.  Can use Lidocaine patches the day AFTER injections Can use theracane on day of injections in places didn't inject Can use heating pad 4-6 hours AFTER injections  4. Con't LOW dose naltrexone 5 mg daily.  They will mail it to you if you call.  Hurting more because on lower dose of Naltrexone right now since ran out of 5 mg daily.   5. F/U in 1 months for trigger point injections.   I spent a total of 20 minutes on visit- as detailed above.

## 2020-11-06 DIAGNOSIS — M4312 Spondylolisthesis, cervical region: Secondary | ICD-10-CM | POA: Diagnosis not present

## 2020-11-06 DIAGNOSIS — M5412 Radiculopathy, cervical region: Secondary | ICD-10-CM | POA: Diagnosis not present

## 2020-11-06 DIAGNOSIS — Z6825 Body mass index (BMI) 25.0-25.9, adult: Secondary | ICD-10-CM | POA: Diagnosis not present

## 2020-11-27 ENCOUNTER — Encounter: Payer: Self-pay | Admitting: Adult Health

## 2020-11-27 ENCOUNTER — Other Ambulatory Visit: Payer: Self-pay

## 2020-11-27 ENCOUNTER — Ambulatory Visit (INDEPENDENT_AMBULATORY_CARE_PROVIDER_SITE_OTHER): Payer: BLUE CROSS/BLUE SHIELD | Admitting: Adult Health

## 2020-11-27 DIAGNOSIS — F331 Major depressive disorder, recurrent, moderate: Secondary | ICD-10-CM

## 2020-11-27 DIAGNOSIS — F4310 Post-traumatic stress disorder, unspecified: Secondary | ICD-10-CM

## 2020-11-27 DIAGNOSIS — F3181 Bipolar II disorder: Secondary | ICD-10-CM | POA: Diagnosis not present

## 2020-11-27 DIAGNOSIS — F411 Generalized anxiety disorder: Secondary | ICD-10-CM | POA: Diagnosis not present

## 2020-11-27 MED ORDER — ALPRAZOLAM 1 MG PO TABS: 1.0000 mg | ORAL_TABLET | Freq: Two times a day (BID) | ORAL | 2 refills | Status: DC | PRN

## 2020-11-27 NOTE — Progress Notes (Signed)
[name] [id number] 02-15-1961 60 y.o.  Subjective:   Patient ID:  [name] is a 60 y.o. (DOB 1961-03-15) female.  Chief Complaint: No chief complaint on file.   HPI [name] presents to the office today for follow-up of MDD, GAD, PTSD, BPD 2.   Describes mood today as "ok". Pleasant. Denies tearfulness. Mood symptoms - reports depression, anxiety, and irritability. Stating "I've had my days of being on a roller coaster". Did not feel like Cymbalta was helpful and discontinued. Had to put one of her dogs down recently. Working with pain management. Getting injections every 4 to 6 weeks. She and husband doing well. Stable interest and motivation. Taking medications as prescribed.  Energy levels mostly decreased. Active, does not have a regular exercise routine.  Enjoys some usual interests and activities. Married. Lives with husband of 40 years - 3 dogs. Talking to children - 1 son and 1 daughter. Spending time with family. Appetite adequate. Weight stable - 145 pounds. Sleeps better some nights than others. Averages 5 hours. Reports decreased daytime napping. Focus and concentration stable. Completing tasks. Managing aspects of household. Disabled. Denies SI or HI.  Denies AH or VH.  Previous medications - Abilify, Wellbutrin, Gabapentin, Lyrica, Prozac       PHQ2-9   Flowsheet Row Office Visit from 10/28/2020 in [facility] Office Visit from 05/27/2020 in [facility] Office Visit from 04/05/2020 in [facility]  PHQ-2 Total Score 1 2 3   PHQ-9 Total Score -- -- 10       Review of Systems:  Review of Systems  Musculoskeletal: Negative for gait problem.  Neurological: Negative for tremors.  Psychiatric/Behavioral:       Please refer to HPI    Medications: I have reviewed the patient's current medications.  Current Outpatient Medications   Medication Sig Dispense Refill  . acetaminophen (TYLENOL) 500 MG tablet Take 1,000 mg by mouth every 6 (six) hours as needed for mild pain or headache.     . ALPRAZolam (XANAX) 1 MG tablet Take 1 tablet (1 mg total) by mouth 2 (two) times daily as needed for anxiety. 60 tablet 2  . bisacodyl (DULCOLAX) 5 MG EC tablet Take 5 mg by mouth 2 (two) times daily as needed for moderate constipation.     . busPIRone (BUSPAR) 15 MG tablet Take 1 tablet (15 mg total) by mouth 3 (three) times daily. 270 tablet 3  . citalopram (CELEXA) 20 MG tablet Take 1 tablet (20 mg total) by mouth daily. 90 tablet 3  . hydrochlorothiazide (HYDRODIURIL) 25 MG tablet Take 1 tablet (25 mg total) by mouth daily. APPOINTMENT NEEDED FOR FURTHER REFILLS CALL [phone] 15 tablet 0  . HYDROcodone-acetaminophen (NORCO) 7.5-325 MG tablet Take by mouth.    12-22-1976 HYDROcodone-acetaminophen (NORCO/VICODIN) 5-325 MG tablet Take by mouth.    . hydrOXYzine (ATARAX/VISTARIL) 25 MG tablet TAKE 3 TO 4 TABLETS BY MOUTH EVERY 8 HOURS AS NEEDED FOR ANXIETY. MAX AMOUNT PER INSURANCE (Patient taking differently: Take 75-100 mg by mouth every 8 (eight) hours as needed for anxiety. TAKE 3 TO 4 TABLETS BY MOUTH EVERY 8 HOURS AS NEEDED FOR ANXIETY. MAX AMOUNT PER INSURANCE) 360 tablet 2  . levothyroxine (SYNTHROID) 50 MCG tablet TAKE 1 TABLET BY MOUTH EVERY DAY (Patient taking differently: Take 50 mcg by mouth daily.) 90 tablet 0  . lidocaine (LIDODERM) 5 % Place 3 patches onto the skin daily. Remove &  Discard patch within 12 hours or as directed by MD 90 patch 11  . loratadine (CLARITIN) 10 MG tablet Take 10 mg by mouth 2 (two) times daily as needed (for seasonal allergies).     . meclizine (ANTIVERT) 25 MG tablet Take 1 tablet (25 mg total) by mouth 3 (three) times daily as needed for dizziness. 30 tablet 3  . methocarbamol (ROBAXIN) 500 MG tablet take orally    . metoprolol tartrate (LOPRESSOR) 25 MG tablet TAKE 1 TABLET BY MOUTH TWICE A DAY 30 tablet  0  . naproxen (NAPROSYN) 500 MG tablet TAKE 1 TABLET DAILY AS NEEDED FOR PAIN. USE SPARINGLY. 30 tablet 1  . risperiDONE (RISPERDAL) 1 MG tablet Take one tablet at bedtime. 90 tablet 3  . rivaroxaban (XARELTO) 10 MG TABS tablet Take by mouth.    Marland Kitchen tiZANidine (ZANAFLEX) 4 MG capsule Take 1 capsule (4 mg total) by mouth 3 (three) times daily. 90 capsule 3   No current facility-administered medications for this visit.    Medication Side Effects: None  Allergies:  Allergies  Allergen Reactions  . Morphine And Related Other (See Comments)    "Didn't agree with me/wasn't very effective.."  . Oxycodone   . Lyrica [Pregabalin] Rash  . Neurontin [Gabapentin] Other (See Comments)    Left patient sleepy all the time    Past Medical History:  Diagnosis Date  . Back injury    Chronic  . Bipolar disorder (HCC)   . Cardiomyopathy (HCC)    hypertension controlled-helped with edema-Dr Nahser  . Deafness    Left ear  . Degenerative joint disease    with neck surgery  . Depression   . Diastolic dysfunction    with possible mild LVOT gradient  . Dyspnea   . Dysrhythmia    per patient, took too much naproxen which caused an "irregular heart beat"  . Family history of adverse reaction to anesthesia    " my daughter has PONV and gets very grumpy"  . Fibromyalgia   . Former cigarette smoker   . Gestational diabetes   . H/O blood clots    due to side effect of medication  . Headache(784.0)    migraines  . Hypertension   . Hypothyroidism   . Leg cramps   . Neuropathy   . Pain    chronic   . Pneumonia   . PTSD (post-traumatic stress disorder)   . Respiratory disorder   . Schizophrenia (HCC)     Family History  Problem Relation Age of Onset  . Coronary artery disease Father   . Parkinson's disease Mother   . Cancer Sister        breast  . Diabetes Sister   . Diabetes Brother     Social History   Socioeconomic History  . Marital status: Married    Spouse name: Reuel Boom  .  Number of children: 2  . Years of education: 41  . Highest education level: Not on file  Occupational History    Comment: homemaker  Tobacco Use  . Smoking status: Current Every Day Smoker    Packs/day: 0.50    Years: 25.00    Pack years: 12.50    Types: Cigarettes  . Smokeless tobacco: Never Used  Vaping Use  . Vaping Use: Never used  Substance and Sexual Activity  . Alcohol use: Yes    Comment: very rare  . Drug use: No  . Sexual activity: Not on file  Other Topics Concern  .  Not on file  Social History Narrative   Patient is married [name]) and lives at home with her husband.   Patient has two adult children.   Patient is currently not working.   Patient has a high school education.   Patient is right-handed.   Patient drinks 1-2 cups of coffee per day.   Social Determinants of Health   Financial Resource Strain: Not on file  Food Insecurity: Not on file  Transportation Needs: Not on file  Physical Activity: Not on file  Stress: Not on file  Social Connections: Not on file  Intimate Partner Violence: Not on file    Past Medical History, Surgical history, Social history, and Family history were reviewed and updated as appropriate.   Please see review of systems for further details on the patient's review from today.   Objective:   Physical Exam:  There were no vitals taken for this visit.  Physical Exam Constitutional:      General: She is not in acute distress. Musculoskeletal:        General: No deformity.  Neurological:     Mental Status: She is alert and oriented to person, place, and time.     Coordination: Coordination normal.  Psychiatric:        Attention and Perception: Attention and perception normal. She does not perceive auditory or visual hallucinations.        Mood and Affect: Mood normal. Mood is not anxious or depressed. Affect is not labile, blunt, angry or inappropriate.        Speech: Speech normal.        Behavior: Behavior normal.         Thought Content: Thought content normal. Thought content is not paranoid or delusional. Thought content does not include homicidal or suicidal ideation. Thought content does not include homicidal or suicidal plan.        Cognition and Memory: Cognition and memory normal.        Judgment: Judgment normal.     Comments: Insight intact     Lab Review:     Component Value Date/Time   NA 137 01/26/2020 0839   K 4.1 01/26/2020 0839   CL 101 01/26/2020 0839   CO2 26 01/26/2020 0839   GLUCOSE 107 (H) 01/26/2020 0839   BUN 13 01/26/2020 0839   CREATININE 0.73 01/26/2020 0839   CREATININE 0.85 01/19/2020 1507   CALCIUM 8.8 (L) 01/26/2020 0839   PROT 6.4 11/14/2019 1108   ALBUMIN 3.9 11/14/2019 1108   AST 14 11/14/2019 1108   ALT 13 11/14/2019 1108   ALKPHOS 69 11/14/2019 1108   BILITOT 0.3 11/14/2019 1108   GFRNONAA >60 01/26/2020 0839   GFRAA >60 01/26/2020 0839       Component Value Date/Time   WBC 8.9 01/26/2020 0839   RBC 4.27 01/26/2020 0839   HGB 13.6 01/26/2020 0839   HCT 40.7 01/26/2020 0839   PLT 354 01/26/2020 0839   MCV 95.3 01/26/2020 0839   MCH 31.9 01/26/2020 0839   MCHC 33.4 01/26/2020 0839   RDW 12.9 01/26/2020 0839   LYMPHSABS 2.4 09/27/2014 1325   MONOABS 0.4 09/27/2014 1325   EOSABS 0.0 09/27/2014 1325   BASOSABS 0.0 09/27/2014 1325    No results found for: POCLITH, LITHIUM   No results found for: PHENYTOIN, PHENOBARB, VALPROATE, CBMZ   .res Assessment: Plan:    Plan:  Buspar 30mg  BID Xanax 1mg  BID  Celexa 20mg  daily Risperdal 1mg  at bedtime D/C Cymbalta 30mg  daily  3 months  Therapy - Rockne Menghiniebbie Dowd  Patient advised to contact office with any questions, adverse effects, or acute worsening in signs and symptoms.  Discussed potential benefits, risk, and side effects of benzodiazepines to include potential risk of tolerance and dependence, as well as possible drowsiness.  Advised patient not to drive if experiencing drowsiness and to take  lowest possible effective dose to minimize risk of dependence and tolerance.  Discussed potential metabolic side effects associated with atypical antipsychotics, as well as potential risk for movement side effects. Advised pt to contact office if movement side effects occur.     Diagnoses and all orders for this visit:  GAD (generalized anxiety disorder) -     ALPRAZolam (XANAX) 1 MG tablet; Take 1 tablet (1 mg total) by mouth 2 (two) times daily as needed for anxiety.  Major depressive disorder, recurrent episode, moderate (HCC)  PTSD (post-traumatic stress disorder)  Bipolar II disorder (HCC)     Please see After Visit Summary for patient specific instructions.  Future Appointments  Date Time Provider Department Center  12/02/2020  3:20 PM Lovorn, Aundra MilletMegan, MD [facility]    No orders of the defined types were placed in this encounter.   -------------------------------

## 2020-12-01 IMAGING — CR DG HAND 2V*L*
2 series · 2 of 2 positions shown · non-contrast
Comparison: 11/29/2019

CLINICAL DATA: Ring finger fracture

EXAM:
LEFT HAND - 2 VIEW

[hand pa]
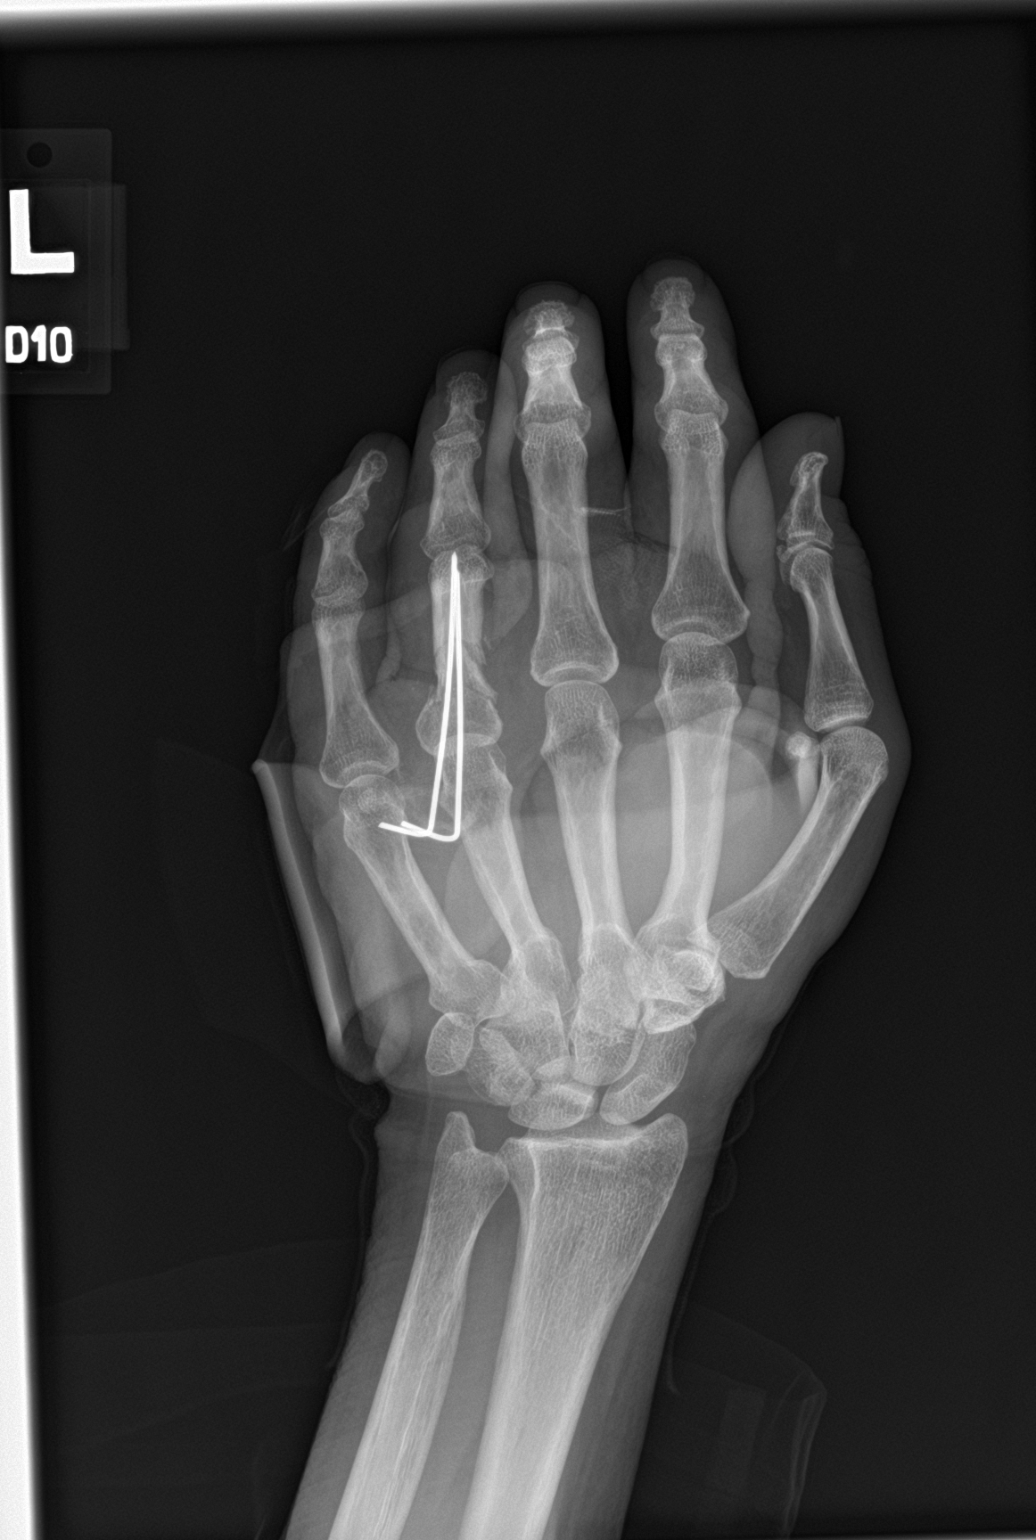

[hand lat]
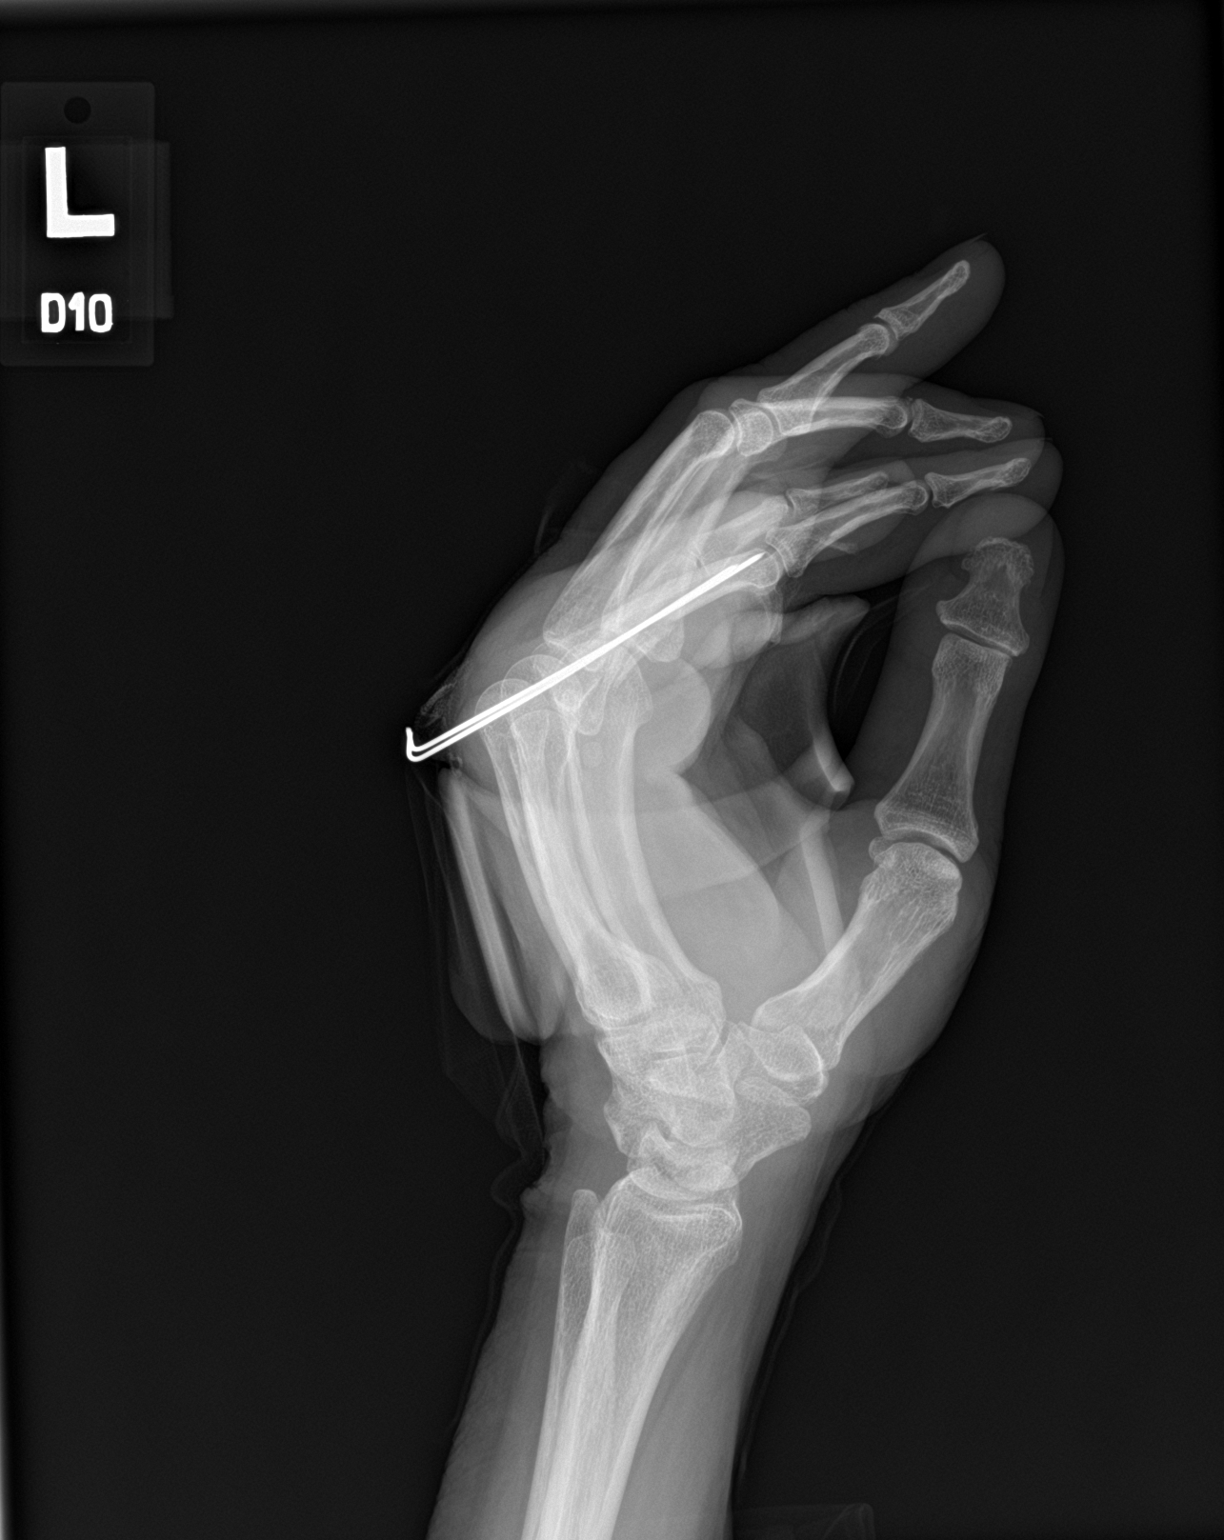

[2 of 2 positions shown; findings below may reference images not displayed]

FINDINGS: Interval K-wire fixation of angulated fractures of the left fourth
proximal phalanx, now with near anatomic of fracture fragments. Cast
material about the hand.
IMPRESSION: Interval K-wire fixation of angulated fractures of the left fourth
proximal phalanx, now with near anatomic of fracture fragments. Cast
material about the hand.

## 2020-12-02 ENCOUNTER — Encounter
Payer: BLUE CROSS/BLUE SHIELD | Attending: Physical Medicine and Rehabilitation | Admitting: Physical Medicine and Rehabilitation

## 2020-12-02 ENCOUNTER — Encounter: Payer: Self-pay | Admitting: Physical Medicine and Rehabilitation

## 2020-12-02 ENCOUNTER — Other Ambulatory Visit: Payer: Self-pay

## 2020-12-02 VITALS — BP 95/57 | HR 56 | Temp 98.0°F | Ht 65.5 in | Wt 153.4 lb

## 2020-12-02 DIAGNOSIS — M7918 Myalgia, other site: Secondary | ICD-10-CM

## 2020-12-02 DIAGNOSIS — S13100S Subluxation of unspecified cervical vertebrae, sequela: Secondary | ICD-10-CM

## 2020-12-02 DIAGNOSIS — R208 Other disturbances of skin sensation: Secondary | ICD-10-CM

## 2020-12-02 NOTE — Patient Instructions (Signed)
Plan: 1. Patient here for trigger point injections for myofascial pain.  Consent done and on chart.  Cleaned areas with alcohol and injected using a 27 gauge 1.5 inch needle  Injected  5cc Using 1% Lidocaine with no EPI  Upper traps B/L Levators B/L Posterior scalenes Middle scalenes Splenius Capitus Pectoralis Major Rhomboids B/L x2 Infraspinatus Teres Major/minor Thoracic paraspinals Lumbar paraspinals B/L  Other injections-    Patient's level of pain prior was 8-9/10 Current level of pain after injections is- 7.5/10  There was no bleeding or complications.  Patient was advised to drink a lot of water on day after injections to flush system Will have increased soreness for 12-48 hours after injections.  Can use Lidocaine patches the day AFTER injections Can use theracane on day of injections in places didn't inject Can use heating pad 4-6 hours AFTER injections   2. Continue Naltrexone 5mg  daily  and Zanaflex for pain/muscle spasms.    3. F/U in 4-6 weeks

## 2020-12-02 NOTE — Progress Notes (Signed)
Patient is a 60 yr old female with hx of multiple spinal surgeries- all surgeries on neck and neck and back pain, fibromyalgia, and previous L carpal tunnel surgery -  Here for f/u of chronic pain.    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   Nothing new- just trying to be more active- feeling a lot more pain.  Neck is hurting more- and shoot down more back - since doing more outside/with dogs and walking more.  Has been trying to go a little slower than before, but put 2 rows of pees in-   Saw [facility] about neck.  They felt with arthritis and fusion of cervical spine, this is the best she's going to get.  Dr Ilean Skill.     Plan: 1. Patient here for trigger point injections for myofascial pain.  Consent done and on chart.  Cleaned areas with alcohol and injected using a 27 gauge 1.5 inch needle  Injected  5cc Using 1% Lidocaine with no EPI  Upper traps B/L Levators B/L Posterior scalenes Middle scalenes Splenius Capitus Pectoralis Major Rhomboids B/L x2 Infraspinatus Teres Major/minor Thoracic paraspinals Lumbar paraspinals B/L  Other injections-    Patient's level of pain prior was 8-9/10 Current level of pain after injections is- 7.5/10  There was no bleeding or complications.  Patient was advised to drink a lot of water on day after injections to flush system Will have increased soreness for 12-48 hours after injections.  Can use Lidocaine patches the day AFTER injections Can use theracane on day of injections in places didn't inject Can use heating pad 4-6 hours AFTER injections   2. Continue Naltrexone 5mg  daily  and Zanaflex for pain/muscle spasms.    3. F/U in 4-6 weeks

## 2020-12-23 ENCOUNTER — Telehealth: Payer: Self-pay | Admitting: Physical Medicine and Rehabilitation

## 2020-12-23 ENCOUNTER — Other Ambulatory Visit: Payer: Self-pay | Admitting: Family Medicine

## 2020-12-23 NOTE — Telephone Encounter (Signed)
Patient states the shot is wearing off, and is in pain would like to know what you advise to help ease the pain. Her next apt is 6/6 and she is on the wait list .

## 2021-01-22 ENCOUNTER — Other Ambulatory Visit: Payer: Self-pay | Admitting: Physical Medicine and Rehabilitation

## 2021-01-27 ENCOUNTER — Encounter: Payer: BLUE CROSS/BLUE SHIELD | Admitting: Physical Medicine and Rehabilitation

## 2021-02-22 ENCOUNTER — Other Ambulatory Visit: Payer: Self-pay | Admitting: Family Medicine

## 2021-02-26 ENCOUNTER — Other Ambulatory Visit: Payer: Self-pay

## 2021-02-26 ENCOUNTER — Ambulatory Visit (INDEPENDENT_AMBULATORY_CARE_PROVIDER_SITE_OTHER): Payer: BLUE CROSS/BLUE SHIELD | Admitting: Adult Health

## 2021-02-26 ENCOUNTER — Other Ambulatory Visit: Payer: Self-pay | Admitting: Adult Health

## 2021-02-26 ENCOUNTER — Encounter: Payer: Self-pay | Admitting: Adult Health

## 2021-02-26 DIAGNOSIS — F331 Major depressive disorder, recurrent, moderate: Secondary | ICD-10-CM

## 2021-02-26 DIAGNOSIS — F411 Generalized anxiety disorder: Secondary | ICD-10-CM | POA: Diagnosis not present

## 2021-02-26 DIAGNOSIS — F4310 Post-traumatic stress disorder, unspecified: Secondary | ICD-10-CM | POA: Diagnosis not present

## 2021-02-26 DIAGNOSIS — F3181 Bipolar II disorder: Secondary | ICD-10-CM | POA: Diagnosis not present

## 2021-02-26 NOTE — Progress Notes (Signed)
[name] [id number] Apr 16, 1961 60 y.o.  Subjective:   Patient ID:  [name] is a 60 y.o. (DOB 28-Dec-1960) female.  Chief Complaint: No chief complaint on file.   HPI [name] presents to the office today for follow-up of MDD, GAD, PTSD, BPD 2.   Describes mood today as "ok". Pleasant. Denies tearfulness. Mood symptoms - reports depression, anxiety, and irritability. Having good and bad days. Ongoing pain issues - "injections are starting to help some". Stating "I'm doing alright for now". She and husband doing well. Stable interest and motivation. Taking medications as prescribed.  Energy levels varies - "mostly low". Active, does not have a regular exercise routine.  Enjoys some usual interests and activities. Married. Lives with husband of 40 years - 3 dogs. Talking to children - 1 son and 1 daughter. Spending time with family. Appetite adequate. Weight stable - 145 pounds. Sleeps better some nights than others. Averages 5 to 6 hours.  Focus and concentration stable. Completing tasks. Managing aspects of household. Disabled. Denies SI or HI.  Denies AH or VH.  Previous medications - Abilify, Wellbutrin, Gabapentin, Lyrica, Prozac      PHQ2-9    Flowsheet Row Office Visit from 10/28/2020 in [facility] Physical Medicine and Rehabilitation Office Visit from 05/27/2020 in [facility] Physical Medicine and Rehabilitation Office Visit from 04/05/2020 in [facility] Physical Medicine and Rehabilitation  PHQ-2 Total Score 1 2 3   PHQ-9 Total Score -- -- 10        Review of Systems:  Review of Systems  Musculoskeletal:  Negative for gait problem.  Neurological:  Negative for tremors.  Psychiatric/Behavioral:         Please refer to HPI   Medications: I have reviewed the patient's current medications.  Current Outpatient Medications  Medication Sig Dispense Refill   acetaminophen (TYLENOL) 500 MG tablet Take 1,000 mg by mouth every 6 (six) hours as needed  for mild pain or headache.      ALPRAZolam (XANAX) 1 MG tablet Take 1 tablet (1 mg total) by mouth 2 (two) times daily as needed for anxiety. 60 tablet 2   bisacodyl (DULCOLAX) 5 MG EC tablet Take 5 mg by mouth 2 (two) times daily as needed for moderate constipation.      busPIRone (BUSPAR) 15 MG tablet Take 1 tablet (15 mg total) by mouth 3 (three) times daily. 270 tablet 3   citalopram (CELEXA) 20 MG tablet Take 1 tablet (20 mg total) by mouth daily. 90 tablet 3   hydrochlorothiazide (HYDRODIURIL) 25 MG tablet Take 1 tablet (25 mg total) by mouth daily. APPOINTMENT NEEDED FOR FURTHER REFILLS CALL [phone] 15 tablet 0   HYDROcodone-acetaminophen (NORCO) 7.5-325 MG tablet Take by mouth.     HYDROcodone-acetaminophen (NORCO/VICODIN) 5-325 MG tablet Take by mouth.     hydrOXYzine (ATARAX/VISTARIL) 25 MG tablet TAKE 3 TO 4 TABLETS BY MOUTH EVERY 8 HOURS AS NEEDED FOR ANXIETY. MAX AMOUNT PER INSURANCE (Patient taking differently: Take 75-100 mg by mouth every 8 (eight) hours as needed for anxiety. TAKE 3 TO 4 TABLETS BY MOUTH EVERY 8 HOURS AS NEEDED FOR ANXIETY. MAX AMOUNT PER INSURANCE) 360 tablet 2   levothyroxine (SYNTHROID) 50 MCG tablet TAKE 1 TABLET BY MOUTH EVERY DAY (Patient taking differently: Take 50 mcg by mouth daily.) 90 tablet 0   lidocaine (LIDODERM) 5 % Place 3 patches onto the skin daily. Remove & Discard patch within 12 hours or as directed by MD 90 patch 11  loratadine (CLARITIN) 10 MG tablet Take 10 mg by mouth 2 (two) times daily as needed (for seasonal allergies).      meclizine (ANTIVERT) 25 MG tablet Take 1 tablet (25 mg total) by mouth 3 (three) times daily as needed for dizziness. 30 tablet 3   methocarbamol (ROBAXIN) 500 MG tablet take orally     metoprolol tartrate (LOPRESSOR) 25 MG tablet TAKE 1 TABLET BY MOUTH TWICE A DAY 30 tablet 0   naproxen (NAPROSYN) 500 MG tablet TAKE 1 TABLET DAILY AS NEEDED FOR PAIN. USE SPARINGLY. 30 tablet 1   risperiDONE (RISPERDAL) 1 MG  tablet Take one tablet at bedtime. 90 tablet 3   rivaroxaban (XARELTO) 10 MG TABS tablet Take by mouth.     tiZANidine (ZANAFLEX) 4 MG capsule TAKE 1 CAPSULE BY MOUTH THREE TIMES DAILY 90 capsule 5   No current facility-administered medications for this visit.    Medication Side Effects: None  Allergies:  Allergies  Allergen Reactions   Morphine And Related Other (See Comments)    "Didn't agree with me/wasn't very effective.."   Oxycodone    Lyrica [Pregabalin] Rash   Neurontin [Gabapentin] Other (See Comments)    Left patient sleepy all the time    Past Medical History:  Diagnosis Date   Back injury    Chronic   Bipolar disorder (HCC)    Cardiomyopathy (HCC)    hypertension controlled-helped with edema-Dr Nahser   Deafness    Left ear   Degenerative joint disease    with neck surgery   Depression    Diastolic dysfunction    with possible mild LVOT gradient   Dyspnea    Dysrhythmia    per patient, took too much naproxen which caused an "irregular heart beat"   Family history of adverse reaction to anesthesia    " my daughter has PONV and gets very grumpy"   Fibromyalgia    Former cigarette smoker    Gestational diabetes    H/O blood clots    due to side effect of medication   Headache(784.0)    migraines   Hypertension    Hypothyroidism    Leg cramps    Neuropathy    Pain    chronic    Pneumonia    PTSD (post-traumatic stress disorder)    Respiratory disorder    Schizophrenia (HCC)     Past Medical History, Surgical history, Social history, and Family history were reviewed and updated as appropriate.   Please see review of systems for further details on the patient's review from today.   Objective:   Physical Exam:  There were no vitals taken for this visit.  Physical Exam Constitutional:      General: She is not in acute distress. Musculoskeletal:        General: No deformity.  Neurological:     Mental Status: She is alert and oriented to  person, place, and time.     Coordination: Coordination normal.  Psychiatric:        Attention and Perception: Attention and perception normal. She does not perceive auditory or visual hallucinations.        Mood and Affect: Mood normal. Mood is not anxious or depressed. Affect is not labile, blunt, angry or inappropriate.        Speech: Speech normal.        Behavior: Behavior normal.        Thought Content: Thought content normal. Thought content is not paranoid or delusional. Thought content  does not include homicidal or suicidal ideation. Thought content does not include homicidal or suicidal plan.        Cognition and Memory: Cognition and memory normal.        Judgment: Judgment normal.     Comments: Insight intact    Lab Review:     Component Value Date/Time   NA 137 01/26/2020 0839   K 4.1 01/26/2020 0839   CL 101 01/26/2020 0839   CO2 26 01/26/2020 0839   GLUCOSE 107 (H) 01/26/2020 0839   BUN 13 01/26/2020 0839   CREATININE 0.73 01/26/2020 0839   CREATININE 0.85 01/19/2020 1507   CALCIUM 8.8 (L) 01/26/2020 0839   PROT 6.4 11/14/2019 1108   ALBUMIN 3.9 11/14/2019 1108   AST 14 11/14/2019 1108   ALT 13 11/14/2019 1108   ALKPHOS 69 11/14/2019 1108   BILITOT 0.3 11/14/2019 1108   GFRNONAA >60 01/26/2020 0839   GFRAA >60 01/26/2020 0839       Component Value Date/Time   WBC 8.9 01/26/2020 0839   RBC 4.27 01/26/2020 0839   HGB 13.6 01/26/2020 0839   HCT 40.7 01/26/2020 0839   PLT 354 01/26/2020 0839   MCV 95.3 01/26/2020 0839   MCH 31.9 01/26/2020 0839   MCHC 33.4 01/26/2020 0839   RDW 12.9 01/26/2020 0839   LYMPHSABS 2.4 09/27/2014 1325   MONOABS 0.4 09/27/2014 1325   EOSABS 0.0 09/27/2014 1325   BASOSABS 0.0 09/27/2014 1325    No results found for: POCLITH, LITHIUM   No results found for: PHENYTOIN, PHENOBARB, VALPROATE, CBMZ   .res Assessment: Plan:     Plan:  Buspar 30mg  BID Xanax 1mg  BID  Celexa 20mg  daily Risperdal 1mg  at bedtime    3  months  Therapy -  Patient advised to contact office with any questions, adverse effects, or acute worsening in signs and symptoms.  Discussed potential benefits, risk, and side effects of benzodiazepines to include potential risk of tolerance and dependence, as well as possible drowsiness.  Advised patient not to drive if experiencing drowsiness and to take lowest possible effective dose to minimize risk of dependence and tolerance.  Discussed potential metabolic side effects associated with atypical antipsychotics, as well as potential risk for movement side effects. Advised pt to contact office if movement side effects occur.   Diagnoses and all orders for this visit:  Major depressive disorder, recurrent episode, moderate (HCC)  GAD (generalized anxiety disorder)  PTSD (post-traumatic stress disorder)  Bipolar II disorder (HCC)    Please see After Visit Summary for patient specific instructions.  Future Appointments  Date Time Provider Department Center  03/31/2021 10:20 AM , MD [facility] CPR  06/30/2021  5:20 PM Elliana Bal, Rockne Menghini, NP [facility] None    No orders of the defined types were placed in this encounter.   -------------------------------

## 2021-02-27 NOTE — Telephone Encounter (Signed)
Last filled 6/6 appt on 11/7

## 2021-03-31 ENCOUNTER — Encounter
Payer: BLUE CROSS/BLUE SHIELD | Attending: Physical Medicine and Rehabilitation | Admitting: Physical Medicine and Rehabilitation

## 2021-03-31 ENCOUNTER — Encounter: Payer: Self-pay | Admitting: Physical Medicine and Rehabilitation

## 2021-03-31 ENCOUNTER — Other Ambulatory Visit: Payer: Self-pay

## 2021-03-31 VITALS — BP 119/72 | HR 58 | Temp 97.9°F | Ht 65.5 in | Wt 149.8 lb

## 2021-03-31 DIAGNOSIS — Y92003 Bedroom of unspecified non-institutional (private) residence as the place of occurrence of the external cause: Secondary | ICD-10-CM | POA: Insufficient documentation

## 2021-03-31 DIAGNOSIS — M797 Fibromyalgia: Secondary | ICD-10-CM | POA: Diagnosis not present

## 2021-03-31 DIAGNOSIS — G8929 Other chronic pain: Secondary | ICD-10-CM | POA: Insufficient documentation

## 2021-03-31 DIAGNOSIS — M5412 Radiculopathy, cervical region: Secondary | ICD-10-CM | POA: Insufficient documentation

## 2021-03-31 DIAGNOSIS — W01190A Fall on same level from slipping, tripping and stumbling with subsequent striking against furniture, initial encounter: Secondary | ICD-10-CM | POA: Insufficient documentation

## 2021-03-31 DIAGNOSIS — R296 Repeated falls: Secondary | ICD-10-CM | POA: Insufficient documentation

## 2021-03-31 DIAGNOSIS — M7918 Myalgia, other site: Secondary | ICD-10-CM

## 2021-03-31 DIAGNOSIS — M549 Dorsalgia, unspecified: Secondary | ICD-10-CM | POA: Diagnosis not present

## 2021-03-31 DIAGNOSIS — S299XXA Unspecified injury of thorax, initial encounter: Secondary | ICD-10-CM | POA: Insufficient documentation

## 2021-03-31 DIAGNOSIS — W19XXXA Unspecified fall, initial encounter: Secondary | ICD-10-CM | POA: Diagnosis not present

## 2021-03-31 MED ORDER — TIZANIDINE HCL 4 MG PO TABS: 6.0000 mg | ORAL_TABLET | Freq: Three times a day (TID) | ORAL | 5 refills | Status: DC

## 2021-03-31 NOTE — Patient Instructions (Signed)
Plan: Trigger point injections. Patient here for trigger point injections for Consent done and on chart.  Cleaned areas with alcohol and injected using a 27 gauge 1.5 inch needle  Injected 6cc Using 1% Lidocaine with no EPI  Upper traps B/L  Levators B/L Posterior scalenes B/L  Middle scalenes Splenius Capitus Pectoralis Major Rhomboids B/L x2 Infraspinatus Teres Major/minor Thoracic paraspinals B/L x2 Lumbar paraspinals B/L x3 Other injections-   Patient's level of pain prior was 8/10 Current level of pain after injections is 8/10- usually takes longer to relax.  There was no bleeding or complications. Patient was advised to drink a lot of water on day after injections to flush system Will have increased soreness for 12-48 hours after injections.  Can use Lidocaine patches the day AFTER injections Can use theracane on day of injections in places didn't inject Can use heating pad 4-6 hours AFTER injections  2. Will increase Zanaflex/Tizanidine to 6-8 m (1.5 to 2 tabs) 3x/day for muscle spasms- #180- 5 refills.   3. F/U  3 months- trigger point injections.

## 2021-03-31 NOTE — Progress Notes (Signed)
Patient is a 60 yr old female with hx of multiple spinal surgeries- all surgeries on neck and neck and back pain, fibromyalgia, and previous L carpal tunnel surgery -   Here for f/u on chronic pain and trigger point injections.   Missed last appointment - had stomach bug.   Not doing as bad as thought would be- 4 more weeks than expected- hurt but not as bad as expected.    Fell 1 month ago- standing up using theracane. Fell backwards- and fell into [employer]- ended up on floor.   Still dealing with back pain from that fall and neck pain that is chronic.  Really hard bending over and doing chores around house- husband is doing yard/house chores for "her".    Didn't get Naltrexone refilled-  So not taking it.  Didn't notice many changes- still opiate free.   Still taking Zanaflex 3x/day- 4 mg   Plan: Trigger point injections. Patient here for trigger point injections for Consent done and on chart.  Cleaned areas with alcohol and injected using a 27 gauge 1.5 inch needle  Injected 6cc Using 1% Lidocaine with no EPI  Upper traps B/L  Levators B/L Posterior scalenes B/L  Middle scalenes Splenius Capitus Pectoralis Major Rhomboids B/L x2 Infraspinatus Teres Major/minor Thoracic paraspinals B/L x2 Lumbar paraspinals B/L x3 Other injections-   Patient's level of pain prior was 8/10 Current level of pain after injections is 8/10- usually takes longer to relax.  There was no bleeding or complications. Patient was advised to drink a lot of water on day after injections to flush system Will have increased soreness for 12-48 hours after injections.  Can use Lidocaine patches the day AFTER injections Can use theracane on day of injections in places didn't inject Can use heating pad 4-6 hours AFTER injections  2. Will increase Zanaflex/Tizanidine to 6-8 m (1.5 to 2 tabs) 3x/day for muscle spasms- #180- 5 refills.   3. Will not see as often due to financial issues.   4. Off  Naltrexone, however cannot start opiates due to discussion with pt about CBD oil- is using.    5  F/U  3 months- trigger point injections.

## 2021-04-15 DIAGNOSIS — H95192 Other disorders following mastoidectomy, left ear: Secondary | ICD-10-CM | POA: Diagnosis not present

## 2021-04-15 DIAGNOSIS — H9212 Otorrhea, left ear: Secondary | ICD-10-CM | POA: Diagnosis not present

## 2021-04-23 ENCOUNTER — Other Ambulatory Visit: Payer: Self-pay | Admitting: Family Medicine

## 2021-05-23 ENCOUNTER — Other Ambulatory Visit: Payer: Self-pay | Admitting: Adult Health

## 2021-05-23 DIAGNOSIS — F411 Generalized anxiety disorder: Secondary | ICD-10-CM

## 2021-05-25 ENCOUNTER — Other Ambulatory Visit: Payer: Self-pay | Admitting: Adult Health

## 2021-05-25 DIAGNOSIS — F3181 Bipolar II disorder: Secondary | ICD-10-CM

## 2021-05-25 DIAGNOSIS — F331 Major depressive disorder, recurrent, moderate: Secondary | ICD-10-CM

## 2021-05-25 DIAGNOSIS — F4310 Post-traumatic stress disorder, unspecified: Secondary | ICD-10-CM

## 2021-05-27 ENCOUNTER — Other Ambulatory Visit: Payer: Self-pay | Admitting: Adult Health

## 2021-05-27 DIAGNOSIS — F411 Generalized anxiety disorder: Secondary | ICD-10-CM

## 2021-05-28 NOTE — Telephone Encounter (Signed)
Pt called requesting Rx for Xanax @ [facility] [place]. Apt 11/7

## 2021-05-28 NOTE — Telephone Encounter (Signed)
Last filled 9/3 appt on 11/7

## 2021-06-22 ENCOUNTER — Other Ambulatory Visit: Payer: Self-pay | Admitting: Adult Health

## 2021-06-22 DIAGNOSIS — F331 Major depressive disorder, recurrent, moderate: Secondary | ICD-10-CM

## 2021-06-23 ENCOUNTER — Other Ambulatory Visit: Payer: Self-pay | Admitting: Family Medicine

## 2021-06-30 ENCOUNTER — Encounter: Payer: Self-pay | Admitting: Adult Health

## 2021-06-30 ENCOUNTER — Ambulatory Visit (INDEPENDENT_AMBULATORY_CARE_PROVIDER_SITE_OTHER): Payer: BLUE CROSS/BLUE SHIELD | Admitting: Adult Health

## 2021-06-30 ENCOUNTER — Other Ambulatory Visit: Payer: Self-pay

## 2021-06-30 ENCOUNTER — Encounter
Payer: BLUE CROSS/BLUE SHIELD | Attending: Physical Medicine and Rehabilitation | Admitting: Physical Medicine and Rehabilitation

## 2021-06-30 ENCOUNTER — Encounter: Payer: Self-pay | Admitting: Physical Medicine and Rehabilitation

## 2021-06-30 VITALS — BP 99/64 | HR 62 | Ht 65.5 in | Wt 153.4 lb

## 2021-06-30 DIAGNOSIS — S13100S Subluxation of unspecified cervical vertebrae, sequela: Secondary | ICD-10-CM | POA: Diagnosis not present

## 2021-06-30 DIAGNOSIS — M5412 Radiculopathy, cervical region: Secondary | ICD-10-CM | POA: Diagnosis not present

## 2021-06-30 DIAGNOSIS — F411 Generalized anxiety disorder: Secondary | ICD-10-CM | POA: Diagnosis not present

## 2021-06-30 DIAGNOSIS — F3181 Bipolar II disorder: Secondary | ICD-10-CM

## 2021-06-30 DIAGNOSIS — F331 Major depressive disorder, recurrent, moderate: Secondary | ICD-10-CM | POA: Diagnosis not present

## 2021-06-30 DIAGNOSIS — F4310 Post-traumatic stress disorder, unspecified: Secondary | ICD-10-CM | POA: Diagnosis not present

## 2021-06-30 DIAGNOSIS — M7918 Myalgia, other site: Secondary | ICD-10-CM | POA: Diagnosis not present

## 2021-06-30 MED ORDER — CITALOPRAM HYDROBROMIDE 20 MG PO TABS: 20.0000 mg | ORAL_TABLET | Freq: Every day | ORAL | 3 refills | Status: DC

## 2021-06-30 NOTE — Progress Notes (Signed)
Patient is a 60 yr old female with hx of multiple spinal surgeries- all surgeries on neck and neck and back pain, fibromyalgia, and previous L carpal tunnel surgery   Here for trigger point injections.     Pain really bad- has been 3 months.  Really tight.       Plan:  Patient here for trigger point injections for  Consent done and on chart.  Cleaned areas with alcohol and injected using a 27 gauge 1.5 inch needle  Injected  6cc Using 1% Lidocaine with no EPI  Upper traps B/L Levators- B/L  Posterior scalenes- B/L  Middle scalenes Splenius Capitus- B/L  Pectoralis Major- declines Rhomboids- B/L  x2 Infraspinatus Teres Major/minor Thoracic paraspinals- B/L  Lumbar paraspinals- B/L x2 Other injections-   Patient's level of pain prior was 8-10/10 Current level of pain after injections is- 7.5/10  There was no bleeding or complications.  Patient was advised to drink a lot of water on day after injections to flush system Will have increased soreness for 12-48 hours after injections.  Can use Lidocaine patches the day AFTER injections Can use theracane on day of injections in places didn't inject Can use heating pad 4-6 hours AFTER injections  2. Will make appointments for next few visits- to see q6 weeks.    3. Will discuss pain in future-- can write for some pain meds.

## 2021-06-30 NOTE — Progress Notes (Signed)
[name] [id number] [birth year] 60 y.o.  Subjective:   Patient ID:  [name] is a 60 y.o. (DOB 05-26-1961) female.  Chief Complaint: No chief complaint on file.   HPI VILLA BURGIN presents to the office today for follow-up of MDD, GAD, PTSD, BPD 2.   Describes mood today as "so-so". Pleasant. Denies tearfulness. Mood symptoms - reports depression, anxiety, and irritability. Stating "I have good and bad days". Feels like medications are helpful. Chronic pain issues - getting worse - working with pain management. She and husband doing well. Stable interest and motivation. Taking medications as prescribed.  Energy levels lower. Active, does not have a regular exercise routine.  Enjoys some usual interests and activities. Married. Lives with husband of 40 years - 3 dogs. Talking to children - 1 son and 1 daughter. Spending time with family. Appetite adequate. Weight stable - 145 pounds. Sleeps better some nights than others. Averages 5 to 6 hours.  Focus and concentration stable. Completing tasks. Managing aspects of household. Disabled. Denies SI or HI.  Denies AH or VH.  Previous medications - Abilify, Wellbutrin, Gabapentin, Lyrica, Prozac     PHQ2-9    Flowsheet Row Office Visit from 06/30/2021 in [facility] Physical Medicine and Rehabilitation Office Visit from 10/28/2020 in [facility] Physical Medicine and Rehabilitation Office Visit from 05/27/2020 in [facility] Physical Medicine and Rehabilitation Office Visit from 04/05/2020 in [facility] Physical Medicine and Rehabilitation  PHQ-2 Total Score 0 1 2 3   PHQ-9 Total Score -- -- -- 10        Review of Systems:  Review of Systems  Musculoskeletal:  Negative for gait problem.  Neurological:  Negative for tremors.  Psychiatric/Behavioral:         Please refer to HPI   Medications: I have reviewed the patient's current medications.  Current Outpatient Medications  Medication Sig Dispense Refill    acetaminophen (TYLENOL) 500 MG tablet Take 1,000 mg by mouth every 6 (six) hours as needed for mild pain or headache.      ALPRAZolam (XANAX) 1 MG tablet TAKE 1 TABLET BY MOUTH TWICE A DAY AS NEEDED FOR ANXIETY 60 tablet 2   bisacodyl (DULCOLAX) 5 MG EC tablet Take 5 mg by mouth 2 (two) times daily as needed for moderate constipation.      busPIRone (BUSPAR) 15 MG tablet TAKE 1 TABLET (15 MG TOTAL) BY MOUTH 3 (THREE) TIMES DAILY. 270 tablet 0   citalopram (CELEXA) 20 MG tablet Take 1 tablet (20 mg total) by mouth daily. 90 tablet 3   hydrochlorothiazide (HYDRODIURIL) 25 MG tablet Take 1 tablet (25 mg total) by mouth daily. APPOINTMENT NEEDED FOR FURTHER REFILLS CALL [phone] 15 tablet 0   hydrOXYzine (ATARAX/VISTARIL) 25 MG tablet TAKE 3 TO 4 TABLETS BY MOUTH EVERY 8 HOURS AS NEEDED FOR ANXIETY. MAX AMOUNT PER INSURANCE (Patient taking differently: Take 75-100 mg by mouth every 8 (eight) hours as needed for anxiety. TAKE 3 TO 4 TABLETS BY MOUTH EVERY 8 HOURS AS NEEDED FOR ANXIETY. MAX AMOUNT PER INSURANCE) 360 tablet 2   levothyroxine (SYNTHROID) 50 MCG tablet TAKE 1 TABLET BY MOUTH EVERY DAY (Patient taking differently: Take 50 mcg by mouth daily.) 90 tablet 0   lidocaine (LIDODERM) 5 % Place 3 patches onto the skin daily. Remove & Discard patch within 12 hours or as directed by MD 90 patch 11   loratadine (CLARITIN) 10 MG tablet Take 10 mg by mouth 2 (two) times daily as needed (  for seasonal allergies).      meclizine (ANTIVERT) 25 MG tablet Take 1 tablet (25 mg total) by mouth 3 (three) times daily as needed for dizziness. 30 tablet 3   metoprolol tartrate (LOPRESSOR) 25 MG tablet TAKE 1 TABLET BY MOUTH TWICE A DAY 30 tablet 0   naproxen (NAPROSYN) 500 MG tablet TAKE 1 TABLET DAILY AS NEEDED FOR PAIN. USE SPARINGLY. 30 tablet 1   risperiDONE (RISPERDAL) 1 MG tablet TAKE 1 TABLET BY MOUTH EVERYDAY AT BEDTIME 90 tablet 0   rivaroxaban (XARELTO) 10 MG TABS tablet Take by mouth.     tiZANidine  (ZANAFLEX) 4 MG tablet Take 1.5-2 tablets (6-8 mg total) by mouth 3 (three) times daily. 180 tablet 5   No current facility-administered medications for this visit.    Medication Side Effects: None  Allergies:  Allergies  Allergen Reactions   Morphine And Related Other (See Comments)    "Didn't agree with me/wasn't very effective.."   Oxycodone    Lyrica [Pregabalin] Rash   Neurontin [Gabapentin] Other (See Comments)    Left patient sleepy all the time    Past Medical History:  Diagnosis Date   Back injury    Chronic   Bipolar disorder (HCC)    Cardiomyopathy (HCC)    hypertension controlled-helped with edema-Dr Nahser   Deafness    Left ear   Degenerative joint disease    with neck surgery   Depression    Diastolic dysfunction    with possible mild LVOT gradient   Dyspnea    Dysrhythmia    per patient, took too much naproxen which caused an "irregular heart beat"   Family history of adverse reaction to anesthesia    " my daughter has PONV and gets very grumpy"   Fibromyalgia    Former cigarette smoker    Gestational diabetes    H/O blood clots    due to side effect of medication   Headache(784.0)    migraines   Hypertension    Hypothyroidism    Leg cramps    Neuropathy    Pain    chronic    Pneumonia    PTSD (post-traumatic stress disorder)    Respiratory disorder    Schizophrenia (HCC)     Past Medical History, Surgical history, Social history, and Family history were reviewed and updated as appropriate.   Please see review of systems for further details on the patient's review from today.   Objective:   Physical Exam:  There were no vitals taken for this visit.  Physical Exam Constitutional:      General: She is not in acute distress. Musculoskeletal:        General: No deformity.  Neurological:     Mental Status: She is alert and oriented to person, place, and time.     Coordination: Coordination normal.  Psychiatric:        Attention and  Perception: Attention and perception normal. She does not perceive auditory or visual hallucinations.        Mood and Affect: Mood normal. Mood is not anxious or depressed. Affect is not labile, blunt, angry or inappropriate.        Speech: Speech normal.        Behavior: Behavior normal.        Thought Content: Thought content normal. Thought content is not paranoid or delusional. Thought content does not include homicidal or suicidal ideation. Thought content does not include homicidal or suicidal plan.  Cognition and Memory: Cognition and memory normal.        Judgment: Judgment normal.     Comments: Insight intact    Lab Review:     Component Value Date/Time   NA 137 01/26/2020 0839   K 4.1 01/26/2020 0839   CL 101 01/26/2020 0839   CO2 26 01/26/2020 0839   GLUCOSE 107 (H) 01/26/2020 0839   BUN 13 01/26/2020 0839   CREATININE 0.73 01/26/2020 0839   CREATININE 0.85 01/19/2020 1507   CALCIUM 8.8 (L) 01/26/2020 0839   PROT 6.4 11/14/2019 1108   ALBUMIN 3.9 11/14/2019 1108   AST 14 11/14/2019 1108   ALT 13 11/14/2019 1108   ALKPHOS 69 11/14/2019 1108   BILITOT 0.3 11/14/2019 1108   GFRNONAA >60 01/26/2020 0839   GFRAA >60 01/26/2020 0839       Component Value Date/Time   WBC 8.9 01/26/2020 0839   RBC 4.27 01/26/2020 0839   HGB 13.6 01/26/2020 0839   HCT 40.7 01/26/2020 0839   PLT 354 01/26/2020 0839   MCV 95.3 01/26/2020 0839   MCH 31.9 01/26/2020 0839   MCHC 33.4 01/26/2020 0839   RDW 12.9 01/26/2020 0839   LYMPHSABS 2.4 09/27/2014 1325   MONOABS 0.4 09/27/2014 1325   EOSABS 0.0 09/27/2014 1325   BASOSABS 0.0 09/27/2014 1325    No results found for: POCLITH, LITHIUM   No results found for: PHENYTOIN, PHENOBARB, VALPROATE, CBMZ   .res Assessment: Plan:     Plan:  Buspar 30mg  BID Xanax 1mg  BID  Celexa 20mg  daily Risperdal 1mg  at bedtime   3 months  Therapy -  Patient advised to contact office with any questions, adverse effects, or  acute worsening in signs and symptoms.  Discussed potential benefits, risk, and side effects of benzodiazepines to include potential risk of tolerance and dependence, as well as possible drowsiness.  Advised patient not to drive if experiencing drowsiness and to take lowest possible effective dose to minimize risk of dependence and tolerance.  Discussed potential metabolic side effects associated with atypical antipsychotics, as well as potential risk for movement side effects. Advised pt to contact office if movement side effects occur.  Diagnoses and all orders for this visit:  GAD (generalized anxiety disorder)  Major depressive disorder, recurrent episode, moderate (HCC) -     citalopram (CELEXA) 20 MG tablet; Take 1 tablet (20 mg total) by mouth daily.  PTSD (post-traumatic stress disorder)  Generalized anxiety disorder  Bipolar II disorder (HCC)    Please see After Visit Summary for patient specific instructions.  Future Appointments  Date Time Provider Department Center  09/22/2021  3:20 PM , MD [facility] CPR  10/28/2021  5:20 PM Aydon Swamy, Rockne Menghini, NP CP[facility] None  11/03/2021  3:20 PM Genice Rouge, MD [facility] CPR  12/15/2021  3:20 PM Lovorn, Thereasa Solo, MD [facility] CPR    No orders of the defined types were placed in this encounter.   -------------------------------

## 2021-06-30 NOTE — Patient Instructions (Signed)
Plan:  Patient here for trigger point injections for  Consent done and on chart.  Cleaned areas with alcohol and injected using a 27 gauge 1.5 inch needle  Injected  6cc Using 1% Lidocaine with no EPI  Upper traps B/L Levators- B/L  Posterior scalenes- B/L  Middle scalenes Splenius Capitus- B/L  Pectoralis Major- declines Rhomboids- B/L  x2 Infraspinatus Teres Major/minor Thoracic paraspinals- B/L  Lumbar paraspinals- B/L x2 Other injections-   Patient's level of pain prior was 8-10/10 Current level of pain after injections is- 7.5/10  There was no bleeding or complications.  Patient was advised to drink a lot of water on day after injections to flush system Will have increased soreness for 12-48 hours after injections.  Can use Lidocaine patches the day AFTER injections Can use theracane on day of injections in places didn't inject Can use heating pad 4-6 hours AFTER injections  2. Will make appointments for next few visits- to see q6 weeks.    3. Will discuss pain in future-- can write for some pain meds.

## 2021-08-18 ENCOUNTER — Other Ambulatory Visit: Payer: Self-pay | Admitting: Family Medicine

## 2021-08-22 ENCOUNTER — Other Ambulatory Visit: Payer: Self-pay | Admitting: Adult Health

## 2021-08-22 DIAGNOSIS — F3181 Bipolar II disorder: Secondary | ICD-10-CM

## 2021-08-22 DIAGNOSIS — F4310 Post-traumatic stress disorder, unspecified: Secondary | ICD-10-CM

## 2021-08-22 DIAGNOSIS — F331 Major depressive disorder, recurrent, moderate: Secondary | ICD-10-CM

## 2021-08-26 ENCOUNTER — Other Ambulatory Visit: Payer: Self-pay | Admitting: Adult Health

## 2021-08-26 DIAGNOSIS — F411 Generalized anxiety disorder: Secondary | ICD-10-CM

## 2021-08-27 ENCOUNTER — Other Ambulatory Visit: Payer: Self-pay | Admitting: Physical Medicine and Rehabilitation

## 2021-08-28 NOTE — Telephone Encounter (Signed)
° ° ° °  LAST APPOINTMENT DATE: @11 /02/2021  NEXT APPOINTMENT DATE:@1 /30/2023

## 2021-09-22 ENCOUNTER — Other Ambulatory Visit: Payer: Self-pay

## 2021-09-22 ENCOUNTER — Encounter: Payer: Self-pay | Admitting: Physical Medicine and Rehabilitation

## 2021-09-22 ENCOUNTER — Encounter
Payer: BLUE CROSS/BLUE SHIELD | Attending: Physical Medicine and Rehabilitation | Admitting: Physical Medicine and Rehabilitation

## 2021-09-22 VITALS — BP 163/101 | HR 68 | Ht 65.5 in | Wt 153.0 lb

## 2021-09-22 DIAGNOSIS — M5412 Radiculopathy, cervical region: Secondary | ICD-10-CM | POA: Insufficient documentation

## 2021-09-22 DIAGNOSIS — R296 Repeated falls: Secondary | ICD-10-CM | POA: Diagnosis not present

## 2021-09-22 DIAGNOSIS — M7918 Myalgia, other site: Secondary | ICD-10-CM | POA: Diagnosis not present

## 2021-09-22 DIAGNOSIS — M797 Fibromyalgia: Secondary | ICD-10-CM | POA: Insufficient documentation

## 2021-09-22 DIAGNOSIS — S13100S Subluxation of unspecified cervical vertebrae, sequela: Secondary | ICD-10-CM | POA: Insufficient documentation

## 2021-09-22 NOTE — Patient Instructions (Signed)
Plan: Will make sure gets appointments every 6 weeks.   2. Patient here for trigger point injections for  Consent done and on chart.  Cleaned areas with alcohol and injected using a 27 gauge 1.5 inch needle  Injected 4.5cc Using 1% Lidocaine with no EPI  Upper traps B/L  Levators- B/L  Posterior scalenes Middle scalenes- B/L  Splenius Capitus- B/L  Pectoralis Major- declines Rhomboids- B/L  Infraspinatus Teres Major/minor Thoracic paraspinals- Lower thoracic- L only Lumbar paraspinals Other injections-    Patient's level of pain prior was  almost 10/10 Current level of pain after injections is 8/10  There was no bleeding or complications.  Patient was advised to drink a lot of water on day after injections to flush system Will have increased soreness for 12-48 hours after injections.  Can use Lidocaine patches the day AFTER injections Can use theracane on day of injections in places didn't inject Can use heating pad 4-6 hours AFTER injections   3. BP 163/101- due to pain- was low last visit, so not sure what's going on- will follow for trend.    4.  Can try chronic pain support group- most of my patients do them online.    5.  Refilled Zanaflex as needed- has Rx right now.   6. Wait on pain meds.    7. F/U q6 weeks. For trigger point injections.
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
## 2021-10-10 DIAGNOSIS — Z9089 Acquired absence of other organs: Secondary | ICD-10-CM | POA: Diagnosis not present

## 2021-10-10 DIAGNOSIS — H95192 Other disorders following mastoidectomy, left ear: Secondary | ICD-10-CM | POA: Diagnosis not present

## 2021-10-28 ENCOUNTER — Other Ambulatory Visit: Payer: Self-pay

## 2021-10-28 ENCOUNTER — Encounter: Payer: Self-pay | Admitting: Adult Health

## 2021-10-28 ENCOUNTER — Ambulatory Visit: Payer: BLUE CROSS/BLUE SHIELD | Admitting: Adult Health

## 2021-10-28 DIAGNOSIS — F4310 Post-traumatic stress disorder, unspecified: Secondary | ICD-10-CM

## 2021-10-28 DIAGNOSIS — F331 Major depressive disorder, recurrent, moderate: Secondary | ICD-10-CM

## 2021-10-28 DIAGNOSIS — F411 Generalized anxiety disorder: Secondary | ICD-10-CM | POA: Diagnosis not present

## 2021-10-28 DIAGNOSIS — F3181 Bipolar II disorder: Secondary | ICD-10-CM | POA: Diagnosis not present

## 2021-10-28 MED ORDER — RISPERIDONE 1 MG PO TABS: ORAL_TABLET | ORAL | 3 refills | Status: DC

## 2021-10-28 MED ORDER — ALPRAZOLAM 1 MG PO TABS: ORAL_TABLET | ORAL | 2 refills | Status: DC

## 2021-10-28 MED ORDER — BUSPIRONE HCL 15 MG PO TABS: 15.0000 mg | ORAL_TABLET | Freq: Three times a day (TID) | ORAL | 3 refills | Status: DC

## 2021-10-28 MED ORDER — CITALOPRAM HYDROBROMIDE 20 MG PO TABS: 20.0000 mg | ORAL_TABLET | Freq: Every day | ORAL | 3 refills | Status: DC

## 2021-10-28 NOTE — Progress Notes (Signed)
[name] [id number] 1961/01/30 61 y.o.  Subjective:   Patient ID:  [name] is a 61 y.o. (DOB Apr 05, 1961) female.  Chief Complaint: No chief complaint on file.   HPI LENIYAH BETO presents to the office today for follow-up of MDD, GAD, PTSD, BPD 2.   Describes mood today as "so-so". Pleasant. Denies tearfulness. Mood symptoms - reports depression, anxiety, and irritability at times - Xanax helpful. Stating "I'm doing ok". Feels like medications are helpful. Recovering from a double ear infection. Chronic pain issues - working with pain management. She and husband doing well. Stable interest and motivation. Taking medications as prescribed.  Energy levels lower. Active, does not have a regular exercise routine.  Enjoys some usual interests and activities. Married. Lives with husband of 65 years - 3 dogs. Talking to children - 1 son and 1 daughter. Spending time with family. Appetite adequate. Weight stable - 145 pounds. Sleeps better some nights than others. Averages 5 to 6 hours. Napping during the day. Focus and concentration stable. Completing tasks. Managing aspects of household. Disabled. Denies SI or HI.  Denies AH or VH.  Previous medications - Abilify, Wellbutrin, Gabapentin, Lyrica, Prozac      PHQ2-9    Flowsheet Row Office Visit from 09/22/2021 in [facility] Office Visit from 06/30/2021 in [facility] Visit from 10/28/2020 in [facility] Office Visit from 05/27/2020 in [facility] Office Visit from 04/05/2020 in [facility]  PHQ-2 Total Score 0 0 1 2 3   PHQ-9 Total Score -- -- -- -- 10        Review of Systems:  Review of Systems  Musculoskeletal:  Negative for gait problem.  Neurological:  Negative for tremors.  Psychiatric/Behavioral:         Please refer to  HPI   Medications: I have reviewed the patient's current medications.  Current Outpatient Medications  Medication Sig Dispense Refill   acetaminophen (TYLENOL) 500 MG tablet Take 1,000 mg by mouth every 6 (six) hours as needed for mild pain or headache.      ALPRAZolam (XANAX) 1 MG tablet TAKE 1 TABLET BY MOUTH TWICE A DAY AS NEEDED FOR ANXIETY 60 tablet 2   bisacodyl (DULCOLAX) 5 MG EC tablet Take 5 mg by mouth 2 (two) times daily as needed for moderate constipation.      busPIRone (BUSPAR) 15 MG tablet Take 1 tablet (15 mg total) by mouth 3 (three) times daily. 270 tablet 3   citalopram (CELEXA) 20 MG tablet Take 1 tablet (20 mg total) by mouth daily. 90 tablet 3   hydrochlorothiazide (HYDRODIURIL) 25 MG tablet Take 1 tablet (25 mg total) by mouth daily. APPOINTMENT NEEDED FOR FURTHER REFILLS CALL [phone] 15 tablet 0   hydrOXYzine (ATARAX/VISTARIL) 25 MG tablet TAKE 3 TO 4 TABLETS BY MOUTH EVERY 8 HOURS AS NEEDED FOR ANXIETY. MAX AMOUNT PER INSURANCE (Patient taking differently: Take 75-100 mg by mouth every 8 (eight) hours as needed for anxiety. TAKE 3 TO 4 TABLETS BY MOUTH EVERY 8 HOURS AS NEEDED FOR ANXIETY. MAX AMOUNT PER INSURANCE) 360 tablet 2   levothyroxine (SYNTHROID) 50 MCG tablet TAKE 1 TABLET BY MOUTH EVERY DAY (Patient taking differently: Take 50 mcg by mouth daily.) 90 tablet 0   lidocaine (LIDODERM) 5 % Place 3 patches onto the skin daily. Remove & Discard patch within 12 hours or as directed by  MD 90 patch 11   loratadine (CLARITIN) 10 MG tablet Take 10 mg by mouth 2 (two) times daily as needed (for seasonal allergies).      meclizine (ANTIVERT) 25 MG tablet Take 1 tablet (25 mg total) by mouth 3 (three) times daily as needed for dizziness. 30 tablet 3   metoprolol tartrate (LOPRESSOR) 25 MG tablet TAKE 1 TABLET BY MOUTH TWICE A DAY 30 tablet 0   naproxen (NAPROSYN) 500 MG tablet TAKE 1 TABLET DAILY AS NEEDED FOR PAIN. USE SPARINGLY. 30 tablet 1   risperiDONE (RISPERDAL) 1  MG tablet TAKE 1 TABLET BY MOUTH EVERYDAY AT BEDTIME 90 tablet 3   rivaroxaban (XARELTO) 10 MG TABS tablet Take by mouth.     tiZANidine (ZANAFLEX) 4 MG tablet TAKE 1 & 1/2 TO 2 (ONE & ONE-HALF TO TWO) TABLETS BY MOUTH THREE TIMES DAILY 180 tablet 5   No current facility-administered medications for this visit.    Medication Side Effects: None  Allergies:  Allergies  Allergen Reactions   Morphine And Related Other (See Comments)    "Didn't agree with me/wasn't very effective.."   Oxycodone    Lyrica [Pregabalin] Rash   Neurontin [Gabapentin] Other (See Comments)    Left patient sleepy all the time    Past Medical History:  Diagnosis Date   Back injury    Chronic   Bipolar disorder ([place])    Cardiomyopathy ([place])    hypertension controlled-helped with edema-Dr Nahser   Deafness    Left ear   Degenerative joint disease    with neck surgery   Depression    Diastolic dysfunction    with possible mild LVOT gradient   Dyspnea    Dysrhythmia    per patient, took too much naproxen which caused an "irregular heart beat"   Family history of adverse reaction to anesthesia    " my daughter has PONV and gets very grumpy"   Fibromyalgia    Former cigarette smoker    Gestational diabetes    H/O blood clots    due to side effect of medication   Headache(784.0)    migraines   Hypertension    Hypothyroidism    Leg cramps    Neuropathy    Pain    chronic    Pneumonia    PTSD (post-traumatic stress disorder)    Respiratory disorder    Schizophrenia ([place])     Past Medical History, Surgical history, Social history, and Family history were reviewed and updated as appropriate.   Please see review of systems for further details on the patient's review from today.   Objective:   Physical Exam:  There were no vitals taken for this visit.  Physical Exam Constitutional:      General: She is not in acute distress. Musculoskeletal:        General: No deformity.  Neurological:      Mental Status: She is alert and oriented to person, place, and time.     Coordination: Coordination normal.  Psychiatric:        Attention and Perception: Attention and perception normal. She does not perceive auditory or visual hallucinations.        Mood and Affect: Mood normal. Mood is not anxious or depressed. Affect is not labile, blunt, angry or inappropriate.        Speech: Speech normal.        Behavior: Behavior normal.        Thought Content: Thought content normal. Thought content  is not paranoid or delusional. Thought content does not include homicidal or suicidal ideation. Thought content does not include homicidal or suicidal plan.        Cognition and Memory: Cognition and memory normal.        Judgment: Judgment normal.     Comments: Insight intact    Lab Review:     Component Value Date/Time   NA 137 01/26/2020 0839   K 4.1 01/26/2020 0839   CL 101 01/26/2020 0839   CO2 26 01/26/2020 0839   GLUCOSE 107 (H) 01/26/2020 0839   BUN 13 01/26/2020 0839   CREATININE 0.73 01/26/2020 0839   CREATININE 0.85 01/19/2020 1507   CALCIUM 8.8 (L) 01/26/2020 0839   PROT 6.4 11/14/2019 1108   ALBUMIN 3.9 11/14/2019 1108   AST 14 11/14/2019 1108   ALT 13 11/14/2019 1108   ALKPHOS 69 11/14/2019 1108   BILITOT 0.3 11/14/2019 1108   GFRNONAA >60 01/26/2020 0839   GFRAA >60 01/26/2020 0839       Component Value Date/Time   WBC 8.9 01/26/2020 0839   RBC 4.27 01/26/2020 0839   HGB 13.6 01/26/2020 0839   HCT 40.7 01/26/2020 0839   PLT 354 01/26/2020 0839   MCV 95.3 01/26/2020 0839   MCH 31.9 01/26/2020 0839   MCHC 33.4 01/26/2020 0839   RDW 12.9 01/26/2020 0839   LYMPHSABS 2.4 09/27/2014 1325   MONOABS 0.4 09/27/2014 1325   EOSABS 0.0 09/27/2014 1325   BASOSABS 0.0 09/27/2014 1325    No results found for: POCLITH, LITHIUM   No results found for: PHENYTOIN, PHENOBARB, VALPROATE, CBMZ   .res Assessment: Plan:    Plan:  Buspar 30mg  BID Xanax 1mg  BID  Celexa  20mg  daily Risperdal 1mg  at bedtime   6 months  Therapy - Rinaldo Cloud  Patient advised to contact office with any questions, adverse effects, or acute worsening in signs and symptoms.  Discussed potential benefits, risk, and side effects of benzodiazepines to include potential risk of tolerance and dependence, as well as possible drowsiness.  Advised patient not to drive if experiencing drowsiness and to take lowest possible effective dose to minimize risk of dependence and tolerance.  Discussed potential metabolic side effects associated with atypical antipsychotics, as well as potential risk for movement side effects. Advised pt to contact office if movement side effects occur.  Diagnoses and all orders for this visit:  Major depressive disorder, recurrent episode, moderate (HCC) -     risperiDONE (RISPERDAL) 1 MG tablet; TAKE 1 TABLET BY MOUTH EVERYDAY AT BEDTIME -     citalopram (CELEXA) 20 MG tablet; Take 1 tablet (20 mg total) by mouth daily.  Bipolar II disorder (HCC) -     risperiDONE (RISPERDAL) 1 MG tablet; TAKE 1 TABLET BY MOUTH EVERYDAY AT BEDTIME  PTSD (post-traumatic stress disorder) -     risperiDONE (RISPERDAL) 1 MG tablet; TAKE 1 TABLET BY MOUTH EVERYDAY AT BEDTIME  GAD (generalized anxiety disorder) -     busPIRone (BUSPAR) 15 MG tablet; Take 1 tablet (15 mg total) by mouth 3 (three) times daily. -     ALPRAZolam (XANAX) 1 MG tablet; TAKE 1 TABLET BY MOUTH TWICE A DAY AS NEEDED FOR ANXIETY     Please see After Visit Summary for patient specific instructions.  Future Appointments  Date Time Provider [place]  11/03/2021  3:20 PM Courtney Heys, MD [facility]  12/22/2021  3:20 PM Courtney Heys, MD [facility]  01/26/2022  3:20 PM Lovorn, Jinny Blossom,  MD [facility]  03/02/2022  3:20 PM Lovorn, Jinny Blossom, MD [facility]  04/30/2022  5:20 PM Anjolaoluwa Siguenza, Berdie Ogren, NP [facility] None    No orders of the defined types were placed in this  encounter.   -------------------------------

## 2021-11-03 ENCOUNTER — Encounter
Payer: BLUE CROSS/BLUE SHIELD | Attending: Physical Medicine and Rehabilitation | Admitting: Physical Medicine and Rehabilitation

## 2021-11-03 ENCOUNTER — Encounter: Payer: Self-pay | Admitting: Physical Medicine and Rehabilitation

## 2021-11-03 ENCOUNTER — Other Ambulatory Visit: Payer: Self-pay

## 2021-11-03 VITALS — BP 115/76 | HR 53 | Temp 97.6°F | Ht 65.5 in | Wt 152.0 lb

## 2021-11-03 DIAGNOSIS — S13100S Subluxation of unspecified cervical vertebrae, sequela: Secondary | ICD-10-CM | POA: Diagnosis not present

## 2021-11-03 DIAGNOSIS — M797 Fibromyalgia: Secondary | ICD-10-CM

## 2021-11-03 DIAGNOSIS — M7918 Myalgia, other site: Secondary | ICD-10-CM | POA: Diagnosis not present

## 2021-11-03 MED ORDER — ORPHENADRINE CITRATE ER 100 MG PO TB12: 100.0000 mg | ORAL_TABLET | Freq: Two times a day (BID) | ORAL | 5 refills | Status: DC | PRN

## 2021-11-03 NOTE — Patient Instructions (Signed)
Plan: ? ?Norflex- Orphenadrine- 100 mg BID as needed- - can take WITH Zanaflex but can make you too sleepy.  ? ?2.  Patient here for trigger point injections for ? Consent done and on chart. ? ?Cleaned areas with alcohol and injected using a 27 gauge 1.5 inch needle ? ?Injected 6cc ?Using 1% Lidocaine with no EPI ? ?Upper traps B/L  ?Levators- B/L  ?Posterior scalenes ?Middle scalenes- B/L  ?Splenius Capitus- B/L  ?Pectoralis Major ?Rhomboids- B/L x3 ?Infraspinatus ?Teres Major/minor ?Thoracic paraspinals ?Lumbar paraspinals ?Other injections-  ?Lots of twitch responses ? ?Patient's level of pain prior was 9/10 ?Current level of pain after injections is- 7/10 ? ?There was no bleeding or complications. ? ?Patient was advised to drink a lot of water on day after injections to flush system ?Will have increased soreness for 12-48 hours after injections.  ?Can use Lidocaine patches the day AFTER injections ?Can use theracane on day of injections in places didn't inject ?Can use heating pad 4-6 hours AFTER injections  ? ?  ?3. Can continue Zanaflex-  with Norflex ? ? ?4. F/U in 6 weeks for trigger point injections.  ?

## 2021-11-03 NOTE — Progress Notes (Signed)
Patient is a 61 yr old female with hx of multiple spinal surgeries- all surgeries on neck and neck and back pain, fibromyalgia, and previous L carpal tunnel surgery H\\ere for trigger point injections and f/u. ? ? ?Had the worst muscle spasms in neck today- in the back. Getting worse.  ?Wondering if getting used to Tizanidine- takes 8 mg 3x/day.  ? ?Flexeril never worked- Also tried Robaxin- was too mild/didn't work -ever-  ? ?The only thing that has helped is Zanaflex- but not working as well lately.  ? ? ?Double ear infection which is causing dizziness.  ?Dx'd 3 weeks- to go back in 2 more weeks for treatment.  ?Got ear drops ABX- and has to restart.  ? ?Arms tights as well as legs and back/neck.  ?And just getting tighter.  ? ? ? ?Plan: ? ?Norflex- Orphenadrine- 100 mg BID as needed- - can take WITH Zanaflex but can make you too sleepy.  ? ?2.  Patient here for trigger point injections for ? Consent done and on chart. ? ?Cleaned areas with alcohol and injected using a 27 gauge 1.5 inch needle ? ?Injected 6cc ?Using 1% Lidocaine with no EPI ? ?Upper traps B/L  ?Levators- B/L  ?Posterior scalenes ?Middle scalenes- B/L  ?Splenius Capitus- B/L  ?Pectoralis Major ?Rhomboids- B/L x3 ?Infraspinatus ?Teres Major/minor ?Thoracic paraspinals ?Lumbar paraspinals ?Other injections-  ?Lots of twitch responses ? ?Patient's level of pain prior was 9/10 ?Current level of pain after injections is- 7/10 ? ?There was no bleeding or complications. ? ?Patient was advised to drink a lot of water on day after injections to flush system ?Will have increased soreness for 12-48 hours after injections.  ?Can use Lidocaine patches the day AFTER injections ?Can use theracane on day of injections in places didn't inject ?Can use heating pad 4-6 hours AFTER injections  ? ?  ?3. Can continue Zanaflex-  with Norflex ? ? ?4. F/U in 6 weeks for trigger point injections.  ?

## 2021-12-05 ENCOUNTER — Other Ambulatory Visit: Payer: Self-pay | Admitting: Family Medicine

## 2021-12-05 MED ORDER — RIVAROXABAN 20 MG PO TABS: 20.0000 mg | ORAL_TABLET | Freq: Every day | ORAL | 3 refills | Status: DC

## 2021-12-05 NOTE — Addendum Note (Signed)
Addended by: Scharlene Gloss B on: 12/05/2021 01:32 PM ? ? Modules accepted: Orders ? ?

## 2021-12-05 NOTE — Telephone Encounter (Signed)
Refill sent in and updated medication list. ?She will call back to schedule a CPE ?

## 2021-12-15 ENCOUNTER — Ambulatory Visit: Payer: BLUE CROSS/BLUE SHIELD | Admitting: Physical Medicine and Rehabilitation

## 2021-12-22 ENCOUNTER — Encounter: Payer: Self-pay | Admitting: Physical Medicine and Rehabilitation

## 2021-12-22 ENCOUNTER — Encounter
Payer: BLUE CROSS/BLUE SHIELD | Attending: Physical Medicine and Rehabilitation | Admitting: Physical Medicine and Rehabilitation

## 2021-12-22 VITALS — BP 167/71 | HR 58 | Ht 65.5 in | Wt 150.0 lb

## 2021-12-22 DIAGNOSIS — M7918 Myalgia, other site: Secondary | ICD-10-CM

## 2021-12-22 DIAGNOSIS — S13100S Subluxation of unspecified cervical vertebrae, sequela: Secondary | ICD-10-CM

## 2021-12-22 MED ORDER — TIZANIDINE HCL 4 MG PO TABS: ORAL_TABLET | ORAL | 5 refills | Status: DC

## 2021-12-22 NOTE — Progress Notes (Signed)
Patient is a 61 yr old female with hx of multiple spinal surgeries- all surgeries on neck and neck and back pain, fibromyalgia, and previous L carpal tunnel surgery Here for trigger point injections and f/u. ? ?Things going well.  ?No falls since last visit.  ? ?Was her ears, making her fall.  ? ?Orphenadrine- doesn't help much-  ?Zanaflex helps more.  ? ? ?Takes Magnesium- 500 mg daily.  ? ?  ?Doesn't want to try  ? ?Starting to get out and in garden again and pain doing better.  ?Needs refill on Zanaflex.  ? ?BP 167/71- a little elevated today- more stressed today.  ?  ?Dizziness is gone since Ear infections done.  ? ?Plan: ?Tizanidine 4 mg takes 8 mg TID for muscle spasms- was on 6-8 mg- but usually takes 8 mg.  ? ?2. Doesn't drive anymore- has to depend on others to get where she needs to go-  ? ?3. Trigger point injections ?Patient here for trigger point injections for ? Consent done and on chart. ? ?Cleaned areas with alcohol and injected using a 27 gauge 1.5 inch needle ? ?Injected 3cc ?Using 1% Lidocaine with no EPI ? ?Upper traps B/L x2 ?Levators- B/L  ?Posterior scalenes ?Middle scalenes- B/L  ?Splenius Capitus ?Pectoralis Major- pt doesn't want done ?Rhomboids B/L  ?Infraspinatus ?Teres Major/minor ?Thoracic paraspinals ?Lumbar paraspinals ?Other injections-  ? ? ?Patient's level of pain prior was 8/10 ?Current level of pain after injections is- 7/10 ? ?There was no bleeding or complications. ? ?Patient was advised to drink a lot of water on day after injections to flush system ?Will have increased soreness for 12-48 hours after injections.  ?Can use Lidocaine patches the day AFTER injections ?Can use theracane on day of injections in places didn't inject ?Can use heating pad 4-6 hours AFTER injections  ?

## 2021-12-22 NOTE — Patient Instructions (Signed)
Plan: ?Tizanidine 4 mg takes 8 mg TID for muscle spasms- was on 6-8 mg- but usually takes 8 mg.  ? ?2. Doesn't drive anymore- has to depend on others to get where she needs to go-  ? ?3. Trigger point injections ?Patient here for trigger point injections for ? Consent done and on chart. ? ?Cleaned areas with alcohol and injected using a 27 gauge 1.5 inch needle ? ?Injected 3cc ?Using 1% Lidocaine with no EPI ? ?Upper traps B/L x2 ?Levators- B/L  ?Posterior scalenes ?Middle scalenes- B/L  ?Splenius Capitus ?Pectoralis Major- pt doesn't want done ?Rhomboids B/L  ?Infraspinatus ?Teres Major/minor ?Thoracic paraspinals ?Lumbar paraspinals ?Other injections-  ? ? ?Patient's level of pain prior was 8/10 ?Current level of pain after injections is- 7/10 ? ?There was no bleeding or complications. ? ?Patient was advised to drink a lot of water on day after injections to flush system ?Will have increased soreness for 12-48 hours after injections.  ?Can use Lidocaine patches the day AFTER injections ?Can use theracane on day of injections in places didn't inject ?Can use heating pad 4-6 hours AFTER injections  ? ?F/U in 6 weeks ?

## 2021-12-29 ENCOUNTER — Telehealth: Payer: Self-pay

## 2021-12-29 MED ORDER — TIZANIDINE HCL 4 MG PO TABS: ORAL_TABLET | ORAL | 5 refills | Status: DC

## 2021-12-29 NOTE — Telephone Encounter (Signed)
Patient called and stated the Tizanidine was sent to the wrong pharmacy. Medication sent to [facility] ?

## 2022-01-03 ENCOUNTER — Other Ambulatory Visit: Payer: Self-pay | Admitting: Family Medicine

## 2022-01-05 DIAGNOSIS — Z9089 Acquired absence of other organs: Secondary | ICD-10-CM | POA: Diagnosis not present

## 2022-01-05 DIAGNOSIS — H7291 Unspecified perforation of tympanic membrane, right ear: Secondary | ICD-10-CM | POA: Diagnosis not present

## 2022-01-26 ENCOUNTER — Encounter: Payer: Self-pay | Admitting: Physical Medicine and Rehabilitation

## 2022-01-26 ENCOUNTER — Encounter
Payer: BLUE CROSS/BLUE SHIELD | Attending: Physical Medicine and Rehabilitation | Admitting: Physical Medicine and Rehabilitation

## 2022-01-26 VITALS — BP 155/87 | HR 63 | Ht 65.5 in | Wt 149.0 lb

## 2022-01-26 DIAGNOSIS — M7918 Myalgia, other site: Secondary | ICD-10-CM | POA: Insufficient documentation

## 2022-01-26 DIAGNOSIS — S13100S Subluxation of unspecified cervical vertebrae, sequela: Secondary | ICD-10-CM | POA: Diagnosis not present

## 2022-01-26 DIAGNOSIS — R296 Repeated falls: Secondary | ICD-10-CM | POA: Diagnosis not present

## 2022-01-26 NOTE — Progress Notes (Signed)
Patient is a 61 yr old female with hx of multiple spinal surgeries- all surgeries on neck and neck and back pain, fibromyalgia, and previous L carpal tunnel surgery Here for trigger point injections and f/u.  NO FALLS! Came really close to tripping on dog x1- caught on doorway- smashed arm up pretty good on L- but doing OK. So 1 near fall, vs the months before that, doing better.    Hurts too much the next day when does much at home.  Mother in law asked if pt had a RW.    BP is better than last week- 155/87-  Has another appt - still needs ot make for higher BP.   Now almost deaf in L ear and partial deafness in R ear.     Plan Patient here for trigger point injections for  Consent done and on chart.  Cleaned areas with alcohol and injected using a 27 gauge 1.5 inch needle  Injected 4.5cc Using 1% Lidocaine with no EPI  Upper traps B/L  x2 Levators- B/L  Posterior scalenes Middle scalenes- B/L  Splenius Capitus- B/L  Pectoralis Major - doesn't want done Rhomboids- B/L x2 Infraspinatus Teres Major/minor Thoracic paraspinals Lumbar paraspinals Other injections-    Patient's level of pain prior was 7/10 Current level of pain after injections is about the same  There was no bleeding or complications.  Patient was advised to drink a lot of water on day after injections to flush system Will have increased soreness for 12-48 hours after injections.  Can use Lidocaine patches the day AFTER injections Can use theracane on day of injections in places didn't inject Can use heating pad 4-6 hours AFTER injections   2. Not interested in assistive devices- at all  3. F/U in 6 weeks- hit some good spots this time! TrP injections.

## 2022-01-26 NOTE — Patient Instructions (Signed)
Plan Patient here for trigger point injections for  Consent done and on chart.  Cleaned areas with alcohol and injected using a 27 gauge 1.5 inch needle  Injected 4.5cc Using 1% Lidocaine with no EPI  Upper traps B/L  x2 Levators- B/L  Posterior scalenes Middle scalenes- B/L  Splenius Capitus- B/L  Pectoralis Major - doesn't want done Rhomboids- B/L x2 Infraspinatus Teres Major/minor Thoracic paraspinals Lumbar paraspinals Other injections-    Patient's level of pain prior was 7/10 Current level of pain after injections is about the same  There was no bleeding or complications.  Patient was advised to drink a lot of water on day after injections to flush system Will have increased soreness for 12-48 hours after injections.  Can use Lidocaine patches the day AFTER injections Can use theracane on day of injections in places didn't inject Can use heating pad 4-6 hours AFTER injections   2. Not interested in assistive devices- at all  3. F/U in 6 weeks- hit some good spots this time! TrP injections.

## 2022-02-02 ENCOUNTER — Other Ambulatory Visit: Payer: Self-pay | Admitting: Family Medicine

## 2022-02-12 ENCOUNTER — Other Ambulatory Visit: Payer: Self-pay

## 2022-02-12 ENCOUNTER — Emergency Department (HOSPITAL_BASED_OUTPATIENT_CLINIC_OR_DEPARTMENT_OTHER): Payer: BLUE CROSS/BLUE SHIELD

## 2022-02-12 ENCOUNTER — Encounter (HOSPITAL_BASED_OUTPATIENT_CLINIC_OR_DEPARTMENT_OTHER): Payer: Self-pay

## 2022-02-12 ENCOUNTER — Other Ambulatory Visit (HOSPITAL_BASED_OUTPATIENT_CLINIC_OR_DEPARTMENT_OTHER): Payer: Self-pay

## 2022-02-12 ENCOUNTER — Emergency Department (HOSPITAL_BASED_OUTPATIENT_CLINIC_OR_DEPARTMENT_OTHER)
Admission: EM | Admit: 2022-02-12 | Discharge: 2022-02-12 | Disposition: A | Discharge status: Home / Self Care | Payer: BLUE CROSS/BLUE SHIELD | Attending: Emergency Medicine | Admitting: Emergency Medicine

## 2022-02-12 DIAGNOSIS — M25532 Pain in left wrist: Secondary | ICD-10-CM | POA: Diagnosis not present

## 2022-02-12 DIAGNOSIS — S299XXA Unspecified injury of thorax, initial encounter: Secondary | ICD-10-CM | POA: Diagnosis not present

## 2022-02-12 DIAGNOSIS — S91032A Puncture wound without foreign body, left ankle, initial encounter: Secondary | ICD-10-CM | POA: Insufficient documentation

## 2022-02-12 DIAGNOSIS — Z79899 Other long term (current) drug therapy: Secondary | ICD-10-CM | POA: Diagnosis not present

## 2022-02-12 DIAGNOSIS — M25572 Pain in left ankle and joints of left foot: Secondary | ICD-10-CM | POA: Diagnosis not present

## 2022-02-12 DIAGNOSIS — W540XXA Bitten by dog, initial encounter: Secondary | ICD-10-CM | POA: Insufficient documentation

## 2022-02-12 DIAGNOSIS — S0990XA Unspecified injury of head, initial encounter: Secondary | ICD-10-CM | POA: Insufficient documentation

## 2022-02-12 DIAGNOSIS — S61532A Puncture wound without foreign body of left wrist, initial encounter: Secondary | ICD-10-CM | POA: Diagnosis not present

## 2022-02-12 DIAGNOSIS — T07XXXA Unspecified multiple injuries, initial encounter: Secondary | ICD-10-CM

## 2022-02-12 DIAGNOSIS — Z7901 Long term (current) use of anticoagulants: Secondary | ICD-10-CM | POA: Insufficient documentation

## 2022-02-12 DIAGNOSIS — S61531A Puncture wound without foreign body of right wrist, initial encounter: Secondary | ICD-10-CM | POA: Insufficient documentation

## 2022-02-12 DIAGNOSIS — M25561 Pain in right knee: Secondary | ICD-10-CM | POA: Diagnosis not present

## 2022-02-12 DIAGNOSIS — S91331A Puncture wound without foreign body, right foot, initial encounter: Secondary | ICD-10-CM | POA: Diagnosis not present

## 2022-02-12 DIAGNOSIS — S61432A Puncture wound without foreign body of left hand, initial encounter: Secondary | ICD-10-CM | POA: Diagnosis not present

## 2022-02-12 DIAGNOSIS — S51811A Laceration without foreign body of right forearm, initial encounter: Secondary | ICD-10-CM | POA: Diagnosis not present

## 2022-02-12 DIAGNOSIS — S91332A Puncture wound without foreign body, left foot, initial encounter: Secondary | ICD-10-CM | POA: Diagnosis not present

## 2022-02-12 DIAGNOSIS — S2231XA Fracture of one rib, right side, initial encounter for closed fracture: Secondary | ICD-10-CM | POA: Diagnosis not present

## 2022-02-12 DIAGNOSIS — S2232XA Fracture of one rib, left side, initial encounter for closed fracture: Secondary | ICD-10-CM | POA: Diagnosis not present

## 2022-02-12 DIAGNOSIS — S51812A Laceration without foreign body of left forearm, initial encounter: Secondary | ICD-10-CM | POA: Insufficient documentation

## 2022-02-12 DIAGNOSIS — S2242XA Multiple fractures of ribs, left side, initial encounter for closed fracture: Secondary | ICD-10-CM | POA: Diagnosis not present

## 2022-02-12 DIAGNOSIS — S61431A Puncture wound without foreign body of right hand, initial encounter: Secondary | ICD-10-CM | POA: Diagnosis not present

## 2022-02-12 DIAGNOSIS — S91031A Puncture wound without foreign body, right ankle, initial encounter: Secondary | ICD-10-CM | POA: Diagnosis not present

## 2022-02-12 DIAGNOSIS — I10 Essential (primary) hypertension: Secondary | ICD-10-CM | POA: Insufficient documentation

## 2022-02-12 DIAGNOSIS — S51852A Open bite of left forearm, initial encounter: Secondary | ICD-10-CM | POA: Diagnosis not present

## 2022-02-12 DIAGNOSIS — M79671 Pain in right foot: Secondary | ICD-10-CM | POA: Diagnosis not present

## 2022-02-12 DIAGNOSIS — M25571 Pain in right ankle and joints of right foot: Secondary | ICD-10-CM | POA: Diagnosis not present

## 2022-02-12 MED ORDER — AMOXICILLIN-POT CLAVULANATE 875-125 MG PO TABS
1.0000 | ORAL_TABLET | Freq: Two times a day (BID) | ORAL | 0 refills | Status: DC
  Filled 2022-02-12: qty 14, 7d supply, fill #0

## 2022-02-12 MED ORDER — ONDANSETRON HCL 4 MG/2ML IJ SOLN
4.0000 mg | Freq: Once | INTRAMUSCULAR | Status: AC
Start: 2022-02-12 — End: 2022-02-12
  Administered 2022-02-12: 4 mg via INTRAVENOUS
  Filled 2022-02-12: qty 2

## 2022-02-12 MED ORDER — LIDOCAINE-EPINEPHRINE (PF) 2 %-1:200000 IJ SOLN
INTRAMUSCULAR | Status: AC
  Administered 2022-02-12: 20 mL
  Filled 2022-02-12: qty 20

## 2022-02-12 MED ORDER — HYDROMORPHONE HCL 1 MG/ML IJ SOLN: 0.5000 mg | Freq: Once | INTRAMUSCULAR | Status: AC

## 2022-02-12 MED ORDER — HYDROCODONE-ACETAMINOPHEN 5-325 MG PO TABS
1.0000 | ORAL_TABLET | Freq: Four times a day (QID) | ORAL | 0 refills | Status: DC | PRN
  Filled 2022-02-12: qty 15, 4d supply, fill #0

## 2022-02-12 MED ORDER — HYDROMORPHONE HCL 1 MG/ML IJ SOLN
INTRAMUSCULAR | Status: AC
  Administered 2022-02-12: 0.5 mg via INTRAVENOUS
  Filled 2022-02-12: qty 1

## 2022-02-12 MED ORDER — HYDROCODONE-ACETAMINOPHEN 5-325 MG PO TABS
2.0000 | ORAL_TABLET | Freq: Once | ORAL | Status: AC
  Administered 2022-02-12: 2 via ORAL
  Filled 2022-02-12: qty 2

## 2022-02-12 MED ORDER — HYDROMORPHONE HCL 1 MG/ML IJ SOLN
0.5000 mg | Freq: Once | INTRAMUSCULAR | Status: AC
  Administered 2022-02-12: 0.5 mg via INTRAVENOUS
  Filled 2022-02-12: qty 1

## 2022-02-12 MED ORDER — "THROMBI-PAD 3""X3"" EX PADS"
1.0000 | MEDICATED_PAD | Freq: Once | CUTANEOUS | Status: AC
  Administered 2022-02-12: 1 via TOPICAL
  Filled 2022-02-12: qty 1
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
## 2022-02-12 NOTE — ED Notes (Signed)
Pt will be using a wheelchair at home with family help. Pt able to stand and pivot x1 MIN assist from lowered stretcher to locked wheelchair.

## 2022-02-12 NOTE — ED Notes (Signed)
Dogs up to date on rabies and vaccines. Tetanus vaccine 2 years ago.

## 2022-02-12 NOTE — ED Notes (Signed)
Pt L forearm bleeding through previously applied pressure dressing. MD made aware. Wound undressed and holding manual pressure.

## 2022-02-12 NOTE — ED Provider Notes (Addendum)
[place] EMERGENCY DEPARTMENT Provider Note   CSN: [id number] Arrival date & time: 02/12/22  1104     History  Chief Complaint  Patient presents with   Animal Bite   Fall    [name] is a 61 y.o. female.  Patient is a 61 year old female with a history of diastolic dysfunction, hypertension, cardiomyopathy, PTSD, bipolar disease, chronic pain on tizanidine and naproxen, prior blood clots on Xarelto who is presenting today after being attacked by her dogs.  The 2 dogs were fighting and she tried to break it up which then caused her to fall to the ground and then the dogs continued to fight and she got bit multiple times in all 4 extremities.  She does recall hitting her head when she fell as well as her left ribs.  Patient did have a fall approximately 2 weeks ago and is already having some left rib pain but she reports now it is very severe.  It hurts anytime she tries to move or take a deep breath.  She denies any abdominal pain.  She does not have headache, change in vision or weakness.  She does note sensation in all 4 extremities.  However when she attempted to stand she reports her left leg went out from under her.  She has multiple areas of wounds which are documented below.  She also has notable swelling of bilateral wrists and hands as well as bilateral ankles.  She does have swelling and ecchymosis noted to the right knee and thigh as well with some tenderness.  Dogs are vaccinated for rabies and they are their own pets  The history is provided by the spouse, the patient and a relative.  Animal Bite Contact animal:  Dog Location:  Hand, leg, foot, finger and shoulder/arm Shoulder/arm injury location:  R hand, R fingers, L forearm, L wrist, L hand and L fingers Hand injury location:  L wrist, L hand, dorsum of L hand, L fingers, dorsum of R hand and R fingers Finger injury location:  R ring finger, L index finger and L long finger Leg injury location:  R foot,  L ankle, R ankle and L foot Foot injury location:  Top of L foot, top of R foot, sole of R foot and L ankle Time since incident:  1 hour Pain details:    Quality:  Aching, localized and sharp   Severity:  Severe   Timing:  Constant   Progression:  Worsening Fall       Home Medications Prior to Admission medications   Medication Sig Start Date End Date Taking? Authorizing Provider  amoxicillin-clavulanate (AUGMENTIN) 875-125 MG tablet Take 1 tablet by mouth every 12 (twelve) hours. 02/12/22  Yes Blanchie Dessert, MD  HYDROcodone-acetaminophen (NORCO/VICODIN) 5-325 MG tablet Take 1 tablet by mouth every 6 (six) hours as needed. 02/12/22  Yes Blanchie Dessert, MD  acetaminophen (TYLENOL) 500 MG tablet Take 1,000 mg by mouth every 6 (six) hours as needed for mild pain or headache.     [provider]  ALPRAZolam Duanne Moron) 1 MG tablet TAKE 1 TABLET BY MOUTH TWICE A DAY AS NEEDED FOR ANXIETY 10/28/21   Mozingo, Berdie Ogren, NP  bisacodyl (DULCOLAX) 5 MG EC tablet Take 5 mg by mouth 2 (two) times daily as needed for moderate constipation.     [provider]  busPIRone (BUSPAR) 15 MG tablet Take 1 tablet (15 mg total) by mouth 3 (three) times daily. 10/28/21   Mozingo, Berdie Ogren,  NP  ciprofloxacin (CILOXAN) 0.3 % ophthalmic solution 4 drops 2 (two) times daily. 01/05/22   [provider]  citalopram (CELEXA) 20 MG tablet Take 1 tablet (20 mg total) by mouth daily. 10/28/21   Mozingo, Berdie Ogren, NP  dexamethasone (DECADRON) 0.1 % ophthalmic solution 2 drops 2 (two) times daily. 01/06/22   [provider]  hydrochlorothiazide (HYDRODIURIL) 25 MG tablet Take 1 tablet (25 mg total) by mouth daily. APPOINTMENT NEEDED FOR FURTHER REFILLS CALL [phone] 07/03/20   Nahser, Wonda Cheng, MD  hydrOXYzine (ATARAX/VISTARIL) 25 MG tablet TAKE 3 TO 4 TABLETS BY MOUTH EVERY 8 HOURS AS NEEDED FOR ANXIETY. MAX AMOUNT PER INSURANCE Patient taking differently: Take 75-100  mg by mouth every 8 (eight) hours as needed for anxiety. TAKE 3 TO 4 TABLETS BY MOUTH EVERY 8 HOURS AS NEEDED FOR ANXIETY. MAX AMOUNT PER INSURANCE 05/02/19   Shelda Pal, DO  levothyroxine (SYNTHROID) 50 MCG tablet TAKE 1 TABLET BY MOUTH EVERY DAY Patient taking differently: Take 50 mcg by mouth daily. 02/14/19   Shelda Pal, DO  lidocaine (LIDODERM) 5 % Place 3 patches onto the skin daily. Remove & Discard patch within 12 hours or as directed by MD 04/26/20   Courtney Heys, MD  loratadine (CLARITIN) 10 MG tablet Take 10 mg by mouth 2 (two) times daily as needed (for seasonal allergies).     [provider]  meclizine (ANTIVERT) 25 MG tablet Take 1 tablet (25 mg total) by mouth 3 (three) times daily as needed for dizziness. 10/07/18   Shelda Pal, DO  metoprolol tartrate (LOPRESSOR) 25 MG tablet TAKE 1 TABLET BY MOUTH TWICE A DAY 07/16/20   Nahser, Wonda Cheng, MD  naproxen (NAPROSYN) 500 MG tablet TAKE 1 TABLET DAILY AS NEEDED FOR PAIN. USE SPARINGLY. 02/02/22   Shelda Pal, DO  orphenadrine (NORFLEX) 100 MG tablet Take 1 tablet (100 mg total) by mouth 2 (two) times daily as needed for muscle spasms. 11/03/21   Lovorn, Jinny Blossom, MD  risperiDONE (RISPERDAL) 1 MG tablet TAKE 1 TABLET BY MOUTH EVERYDAY AT BEDTIME 10/28/21   Mozingo, Berdie Ogren, NP  rivaroxaban (XARELTO) 20 MG TABS tablet Take 1 tablet (20 mg total) by mouth daily with supper. 12/05/21   Wendling, Crosby Oyster, DO  tiZANidine (ZANAFLEX) 4 MG tablet TAKE 1 & 1/2 TO 2 (ONE & ONE-HALF TO TWO) TABLETS BY MOUTH THREE TIMES DAILY 12/29/21   Lovorn, Jinny Blossom, MD      Allergies    Morphine and related, Oxycodone, Lyrica [pregabalin], and Neurontin [gabapentin]    Review of Systems   Review of Systems  Physical Exam Updated Vital Signs BP 135/71   Pulse (!) 108   Temp 97.9 F (36.6 C)   Resp 16   Ht 5' 5.5" (1.664 m)   Wt 67.1 kg   SpO2 97%   BMI 24.25 kg/m  Physical Exam Vitals and  nursing note reviewed.  Constitutional:      General: She is not in acute distress.    Appearance: Normal appearance. She is well-developed.  HENT:     Head: Normocephalic and atraumatic.  Eyes:     Pupils: Pupils are equal, round, and reactive to light.  Cardiovascular:     Rate and Rhythm: Normal rate and regular rhythm.     Heart sounds: Normal heart sounds. No murmur heard.    No friction rub.  Pulmonary:     Effort: Pulmonary effort is normal.     Breath sounds:  Normal breath sounds. No wheezing or rales.  Chest:     Chest wall: Tenderness present.    Abdominal:     General: Bowel sounds are normal. There is no distension.     Palpations: Abdomen is soft.     Tenderness: There is no abdominal tenderness. There is no guarding or rebound.  Musculoskeletal:        General: Swelling, tenderness and signs of injury present. Normal range of motion.     Cervical back: Normal range of motion and neck supple.     Right knee: Swelling, ecchymosis and bony tenderness present. Normal range of motion. Tenderness present over the medial joint line and lateral joint line.     Comments: Numerous puncture wounds present over bilateral wrists and hands and fingers.  Larger macerated laceration and defect to the left palmar forearm.  Patient has full flexion and extension of bilateral wrists with normal flexion extension of the PIP, DIP and MCP joint. Patient also has numerous puncture wounds and lacerations to bilateral ankles, feet.  On the left ankle there is a lacerated tendon but she has normal plantar and dorsiflexion and sensation is intact.  Skin:    General: Skin is warm and dry.     Findings: No rash.  Neurological:     Mental Status: She is alert and oriented to person, place, and time. Mental status is at baseline.     Cranial Nerves: No cranial nerve deficit.  Psychiatric:        Behavior: Behavior normal.     ED Results / Procedures / Treatments   Labs (all labs ordered are  listed, but only abnormal results are displayed) Labs Reviewed - No data to display  EKG None  Radiology DG Wrist Complete Left  Result Date: 02/12/2022 CLINICAL DATA:  Left wrist pain after fall. EXAM: LEFT WRIST - COMPLETE 3+ VIEW COMPARISON:  None Available. FINDINGS: There is no evidence of fracture or dislocation. There is no evidence of arthropathy or other focal bone abnormality. Soft tissues are unremarkable. IMPRESSION: Negative. Electronically Signed   By: Lupita Raider M.D.   On: 02/12/2022 14:56   DG Forearm Left  Result Date: 02/12/2022 CLINICAL DATA:  Dog bite.  Fall. EXAM: LEFT FOREARM - 2 VIEW COMPARISON:  None Available. FINDINGS: There is no evidence of fracture or other focal bone lesions. No radiopaque foreign body is noted. Soft tissue gas is noted in the volar tissues consistent with dog bite. IMPRESSION: No fracture or other bony abnormality is noted. Electronically Signed   By: Lupita Raider M.D.   On: 02/12/2022 14:54   DG Knee Complete 4 Views Right  Result Date: 02/12/2022 CLINICAL DATA:  Right knee pain after fall. EXAM: RIGHT KNEE - COMPLETE 4+ VIEW COMPARISON:  None Available. FINDINGS: No evidence of fracture, dislocation, or joint effusion. No evidence of arthropathy or other focal bone abnormality. Soft tissues are unremarkable. IMPRESSION: Negative. Electronically Signed   By: Lupita Raider M.D.   On: 02/12/2022 14:52   DG Foot Complete Right  Result Date: 02/12/2022 CLINICAL DATA:  Right foot pain after fall. EXAM: RIGHT FOOT COMPLETE - 3+ VIEW COMPARISON:  None Available. FINDINGS: There is no evidence of fracture or dislocation. There is no evidence of arthropathy or other focal bone abnormality. Soft tissues are unremarkable. IMPRESSION: Negative. Electronically Signed   By: Lupita Raider M.D.   On: 02/12/2022 14:51   DG Ankle Complete Right  Result Date: 02/12/2022 CLINICAL  DATA:  Bilateral ankle pain after fall. EXAM: RIGHT ANKLE - COMPLETE 3+  VIEW COMPARISON:  None Available. FINDINGS: There is no evidence of fracture, dislocation, or joint effusion. There is no evidence of arthropathy or other focal bone abnormality. Soft tissues are unremarkable. IMPRESSION: Negative. Electronically Signed   By: Marijo Conception M.D.   On: 02/12/2022 14:50   DG Ankle Complete Left  Result Date: 02/12/2022 CLINICAL DATA:  Bilateral ankle pain after fall.  Dog bite. EXAM: LEFT ANKLE COMPLETE - 3+ VIEW COMPARISON:  None Available. FINDINGS: Extensive soft tissue abnormality is noted medially concerning for dog bite. Irregular cortical fracture is noted anteriorly in the distal tibia most consistent with dog bite. No radiopaque foreign body is noted. IMPRESSION: Irregular cortical fracture is noted anteriorly in the distal tibia most consistent with dog bite. Electronically Signed   By: Marijo Conception M.D.   On: 02/12/2022 14:49   DG Ribs Unilateral W/Chest Left  Result Date: 02/12/2022 CLINICAL DATA:  Fall. EXAM: LEFT RIBS AND CHEST - 3+ VIEW COMPARISON:  June 21, 2018. FINDINGS: Mildly displaced left eighth rib fracture is noted. There is no evidence of pneumothorax or pleural effusion. Both lungs are clear. Heart size and mediastinal contours are within normal limits. IMPRESSION: Mildly displaced left eighth rib fracture. Electronically Signed   By: Marijo Conception M.D.   On: 02/12/2022 14:46   CT Head Wo Contrast  Result Date: 02/12/2022 CLINICAL DATA:  Head trauma moderate to severe.  Fall.  Dog bite EXAM: CT HEAD WITHOUT CONTRAST TECHNIQUE: Contiguous axial images were obtained from the base of the skull through the vertex without intravenous contrast. RADIATION DOSE REDUCTION: This exam was performed according to the departmental dose-optimization program which includes automated exposure control, adjustment of the mA and/or kV according to patient size and/or use of iterative reconstruction technique. COMPARISON:  None Available. FINDINGS: Brain: No  evidence of acute infarction, hemorrhage, hydrocephalus, extra-axial collection or mass lesion/mass effect. Vascular: Negative for hyperdense vessel Skull: Negative Sinuses/Orbits: Mild mucosal edema right ethmoid sinus. Remaining sinuses clear. Negative orbit Other: None IMPRESSION: No acute intracranial abnormality. Electronically Signed   By: Franchot Gallo M.D.   On: 02/12/2022 13:47    Procedures Procedures    LACERATION REPAIR Performed by: [employer] Authorized by: Blanchie Dessert Consent: Verbal consent obtained. Risks and benefits: risks, benefits and alternatives were discussed Consent given by: patient Patient identity confirmed: provided demographic data Prepped and Draped in normal sterile fashion Wound explored  Laceration Location: left forearm multiple wounds   Laceration Length: total length of all wounds 5cm  No Foreign Bodies seen or palpated  Anesthesia: local infiltration  Local anesthetic: lidocaine 2% with epinephrine  Anesthetic total: 4 ml  Irrigation method: syringe Amount of cleaning: standard  Skin closure: 5.0 vicryl rapide  Number of sutures: 9  Technique: simple interrupted  Patient tolerance: Patient tolerated the procedure well with no immediate complications.  LACERATION REPAIR Performed by: [employer] Authorized by: Blanchie Dessert Consent: Verbal consent obtained. Risks and benefits: risks, benefits and alternatives were discussed Consent given by: patient Patient identity confirmed: provided demographic data Prepped and Draped in normal sterile fashion Wound explored  Laceration Location: left ankle/foot multiple  Laceration Length: total of 6cm  No Foreign Bodies seen or palpated  Anesthesia: local infiltration  Local anesthetic: lidocaine 2% with epinephrine  Anesthetic total: 3 ml  Irrigation method: syringe Amount of cleaning: standard  Skin closure: 5.0 vicryl rapide  Number of sutures:  10  Technique: simple interrupted  Patient tolerance: Patient tolerated the procedure well with no immediate complications.  LACERATION REPAIR Performed by: [employer] Authorized by: Blanchie Dessert Consent: Verbal consent obtained. Risks and benefits: risks, benefits and alternatives were discussed Consent given by: patient Patient identity confirmed: provided demographic data Prepped and Draped in normal sterile fashion Wound explored  Laceration Location: right plantar foot multiple wounds  Laceration Length: total length 3cm  No Foreign Bodies seen or palpated  Anesthesia: local infiltration  Local anesthetic: lidocaine 2% with epinephrine  Anesthetic total: 2 ml  Irrigation method: syringe Amount of cleaning: standard  Skin closure: 5.0 vicryl rapide  Number of sutures: 6  Technique: simple interrupted  Patient tolerance: Patient tolerated the procedure well with no immediate complications.  LACERATION REPAIR Performed by: [employer] Authorized by: Blanchie Dessert Consent: Verbal consent obtained. Risks and benefits: risks, benefits and alternatives were discussed Consent given by: patient Patient identity confirmed: provided demographic data Prepped and Draped in normal sterile fashion Wound explored  Laceration Location: right hand  Laceration Length: 2cm  No Foreign Bodies seen or palpated  Anesthesia: local infiltration  Local anesthetic: lidocaine 2% with epinephrine  Anesthetic total: 1 ml  Irrigation method: syringe Amount of cleaning: standard  Skin closure: 5.0 vicryl rapide  Number of sutures: 2  Technique: simple interrupted  Patient tolerance: Patient tolerated the procedure well with no immediate complications.   Medications Ordered in ED Medications  HYDROmorphone (DILAUDID) injection 0.5 mg (0.5 mg Intravenous Given 02/12/22 1143)  ondansetron (ZOFRAN) injection 4 mg (4 mg Intravenous Given 02/12/22 1142)   lidocaine-EPINEPHrine (XYLOCAINE W/EPI) 2 %-1:200000 (PF) injection (20 mLs  Given 02/12/22 1143)  HYDROmorphone (DILAUDID) injection 0.5 mg (0.5 mg Intravenous Given 02/12/22 1216)  HYDROmorphone (DILAUDID) injection 0.5 mg (0.5 mg Intravenous Given 02/12/22 1501)  HYDROcodone-acetaminophen (NORCO/VICODIN) 5-325 MG per tablet 2 tablet (2 tablets Oral Given 02/12/22 1501)  Thrombi-Pad 3"X3" pad 1 each (1 each Topical Given 02/12/22 1519)    ED Course/ Medical Decision Making/ A&P                           Medical Decision Making Amount and/or Complexity of Data Reviewed Radiology: ordered and independent interpretation performed. Decision-making details documented in ED Course.  Risk Prescription drug management.   Pt with multiple medical problems and comorbidities and presenting today with a complaint that caries a high risk for morbidity and mortality.  Presenting today after being attacked by her dogs.  Patient has numerous dog bites with some lacerations and multiple punctures.  Wounds were repaired to achieve hemostasis.  Patient given Augmentin.  Tetanus shot is up-to-date.  Dogs are vaccinated and low suspicion for rabies.  Patient was given pain medication.  3:37 PM I have independently visualized and interpreted pt's images today. Chest x-ray does show a left-sided rib fracture without pneumothorax.  Bilateral ankles and right foot and knee are negative for acute fracture.  Left arm is negative for fracture.  Head CT without intracranial bleed.  Unfortunately patient continued to ooze from her wound on her left palmar forearm.  Dressing was removed and thrombin pad placed with pressure.  Will ensure bleeding has stopped.         Final Clinical Impression(s) / ED Diagnoses Final diagnoses:  Dog bite, initial encounter  Multiple lacerations  Closed fracture of one rib of left side, initial encounter    Rx / DC Orders ED Discharge Orders  Ordered     HYDROcodone-acetaminophen (NORCO/VICODIN) 5-325 MG tablet  Every 6 hours PRN        02/12/22 1537    amoxicillin-clavulanate (AUGMENTIN) 875-125 MG tablet  Every 12 hours        02/12/22 1537              Gwyneth Sprout, MD 02/12/22 1557    Gwyneth Sprout, MD 02/12/22 1558

## 2022-02-12 NOTE — Discharge Instructions (Addendum)
Hold your Xarelto today and tomorrow to prevent further oozing from your arm.  Do not attempt to change any bandages until tomorrow evening.  If there is still some oozing you can apply the other clotting pad.  Make sure you are elevating your legs and wear the boot anytime you are up moving around.  You were given pain medication and antibiotics.  You can continue your chronic pain medication as well.

## 2022-02-12 NOTE — ED Notes (Signed)
MD at bedside suturing wounds

## 2022-02-13 ENCOUNTER — Telehealth: Payer: Self-pay | Admitting: Cardiovascular Disease

## 2022-02-13 ENCOUNTER — Other Ambulatory Visit: Payer: Self-pay | Admitting: Adult Health

## 2022-02-13 DIAGNOSIS — F411 Generalized anxiety disorder: Secondary | ICD-10-CM

## 2022-02-13 MED ORDER — METOPROLOL TARTRATE 25 MG PO TABS: 25.0000 mg | ORAL_TABLET | Freq: Two times a day (BID) | ORAL | 1 refills | Status: DC

## 2022-02-13 NOTE — Telephone Encounter (Signed)
Pt's medication was sent to pt's pharmacy as requested. Confirmation received.  °

## 2022-02-16 ENCOUNTER — Other Ambulatory Visit (HOSPITAL_BASED_OUTPATIENT_CLINIC_OR_DEPARTMENT_OTHER): Payer: Self-pay

## 2022-02-16 ENCOUNTER — Ambulatory Visit: Payer: BLUE CROSS/BLUE SHIELD | Admitting: Family Medicine

## 2022-02-16 VITALS — BP 140/76 | Ht 66.0 in | Wt 148.0 lb

## 2022-02-16 DIAGNOSIS — S91351A Open bite, right foot, initial encounter: Secondary | ICD-10-CM | POA: Insufficient documentation

## 2022-02-16 DIAGNOSIS — M25422 Effusion, left elbow: Secondary | ICD-10-CM

## 2022-02-16 DIAGNOSIS — S82302A Unspecified fracture of lower end of left tibia, initial encounter for closed fracture: Secondary | ICD-10-CM | POA: Insufficient documentation

## 2022-02-16 DIAGNOSIS — S82392A Other fracture of lower end of left tibia, initial encounter for closed fracture: Secondary | ICD-10-CM | POA: Diagnosis not present

## 2022-02-16 DIAGNOSIS — S2232XA Fracture of one rib, left side, initial encounter for closed fracture: Secondary | ICD-10-CM

## 2022-02-16 DIAGNOSIS — W540XXA Bitten by dog, initial encounter: Secondary | ICD-10-CM

## 2022-02-16 MED ORDER — HYDROCODONE-ACETAMINOPHEN 5-325 MG PO TABS
1.0000 | ORAL_TABLET | Freq: Four times a day (QID) | ORAL | 0 refills | Status: DC | PRN
  Filled 2022-02-16: qty 15, 4d supply, fill #0

## 2022-02-17 ENCOUNTER — Other Ambulatory Visit (HOSPITAL_BASED_OUTPATIENT_CLINIC_OR_DEPARTMENT_OTHER): Payer: Self-pay

## 2022-02-17 ENCOUNTER — Ambulatory Visit (HOSPITAL_BASED_OUTPATIENT_CLINIC_OR_DEPARTMENT_OTHER)
Admission: RE | Admit: 2022-02-17 | Discharge: 2022-02-17 | Disposition: A | Discharge status: Home / Self Care | Payer: BLUE CROSS/BLUE SHIELD | Source: Ambulatory Visit | Attending: Family Medicine | Admitting: Family Medicine

## 2022-02-17 DIAGNOSIS — M25422 Effusion, left elbow: Secondary | ICD-10-CM | POA: Insufficient documentation

## 2022-02-17 DIAGNOSIS — M25522 Pain in left elbow: Secondary | ICD-10-CM | POA: Diagnosis not present

## 2022-02-18 ENCOUNTER — Telehealth: Payer: Self-pay | Admitting: Family Medicine

## 2022-02-18 MED ORDER — AMOXICILLIN-POT CLAVULANATE 875-125 MG PO TABS
1.0000 | ORAL_TABLET | Freq: Two times a day (BID) | ORAL | 0 refills | Status: DC
Start: 2022-02-18 — End: 2022-02-28

## 2022-02-18 NOTE — Telephone Encounter (Signed)
Pt's husband called states 3 out of the 14 Augmentin pills were destroyed while in bottle (exposed to water) & they are asking for a small refill to complete required dosage : ( Prescribe by ED doctor) AUGMENTIN) 875-125 MG tablet [[id number]]   Order Details Dose: 1 tablet Route: Oral Frequency: Every 12 hours  Dispense Quantity: 14 tablet Refills: 0 Fills remaining: 0       Sig: Take 1 tablet by mouth every 12 (twelve) hours.   Pt uses :  [facility]  [address], [address] [place] [address]  Phone:  [phone]  Fax:  [phone]   ---Forwarding message to med asst to review w/ provider.  --glh

## 2022-02-18 NOTE — Telephone Encounter (Signed)
Sent in more augmentin.   Myra Rude, MD [facility] 02/18/2022, 4:57 PM

## 2022-02-19 ENCOUNTER — Telehealth: Payer: Self-pay | Admitting: Family Medicine

## 2022-02-19 NOTE — Telephone Encounter (Signed)
Informed of results.   Myra Rude, MD [facility] 02/19/2022, 8:37 AM

## 2022-02-26 ENCOUNTER — Other Ambulatory Visit: Payer: Self-pay

## 2022-02-26 ENCOUNTER — Emergency Department (HOSPITAL_COMMUNITY): Payer: BLUE CROSS/BLUE SHIELD

## 2022-02-26 ENCOUNTER — Inpatient Hospital Stay (HOSPITAL_COMMUNITY)
Admission: EM | Admit: 2022-02-26 | Discharge: 2022-02-28 | DRG: 580 | Disposition: A | Discharge status: Home / Self Care | Payer: BLUE CROSS/BLUE SHIELD | Attending: Internal Medicine | Admitting: Internal Medicine

## 2022-02-26 ENCOUNTER — Ambulatory Visit: Payer: BLUE CROSS/BLUE SHIELD | Admitting: Family Medicine

## 2022-02-26 ENCOUNTER — Encounter: Payer: Self-pay | Admitting: Family Medicine

## 2022-02-26 VITALS — BP 108/60 | Ht 66.0 in | Wt 148.0 lb

## 2022-02-26 DIAGNOSIS — Z833 Family history of diabetes mellitus: Secondary | ICD-10-CM

## 2022-02-26 DIAGNOSIS — I10 Essential (primary) hypertension: Secondary | ICD-10-CM | POA: Diagnosis present

## 2022-02-26 DIAGNOSIS — E440 Moderate protein-calorie malnutrition: Secondary | ICD-10-CM | POA: Diagnosis present

## 2022-02-26 DIAGNOSIS — L089 Local infection of the skin and subcutaneous tissue, unspecified: Secondary | ICD-10-CM | POA: Diagnosis present

## 2022-02-26 DIAGNOSIS — D539 Nutritional anemia, unspecified: Secondary | ICD-10-CM | POA: Diagnosis not present

## 2022-02-26 DIAGNOSIS — S51852A Open bite of left forearm, initial encounter: Secondary | ICD-10-CM | POA: Diagnosis present

## 2022-02-26 DIAGNOSIS — Z79890 Hormone replacement therapy (postmenopausal): Secondary | ICD-10-CM

## 2022-02-26 DIAGNOSIS — T148XXA Other injury of unspecified body region, initial encounter: Secondary | ICD-10-CM | POA: Diagnosis not present

## 2022-02-26 DIAGNOSIS — Z86718 Personal history of other venous thrombosis and embolism: Secondary | ICD-10-CM

## 2022-02-26 DIAGNOSIS — F319 Bipolar disorder, unspecified: Secondary | ICD-10-CM | POA: Diagnosis not present

## 2022-02-26 DIAGNOSIS — Z820 Family history of epilepsy and other diseases of the nervous system: Secondary | ICD-10-CM

## 2022-02-26 DIAGNOSIS — L03114 Cellulitis of left upper limb: Principal | ICD-10-CM | POA: Diagnosis present

## 2022-02-26 DIAGNOSIS — I96 Gangrene, not elsewhere classified: Secondary | ICD-10-CM | POA: Diagnosis not present

## 2022-02-26 DIAGNOSIS — E861 Hypovolemia: Secondary | ICD-10-CM | POA: Diagnosis present

## 2022-02-26 DIAGNOSIS — F17210 Nicotine dependence, cigarettes, uncomplicated: Secondary | ICD-10-CM | POA: Diagnosis present

## 2022-02-26 DIAGNOSIS — Z8249 Family history of ischemic heart disease and other diseases of the circulatory system: Secondary | ICD-10-CM

## 2022-02-26 DIAGNOSIS — M25561 Pain in right knee: Secondary | ICD-10-CM | POA: Diagnosis present

## 2022-02-26 DIAGNOSIS — I110 Hypertensive heart disease with heart failure: Secondary | ICD-10-CM | POA: Diagnosis not present

## 2022-02-26 DIAGNOSIS — D75839 Thrombocytosis, unspecified: Secondary | ICD-10-CM | POA: Diagnosis not present

## 2022-02-26 DIAGNOSIS — S91052A Open bite, left ankle, initial encounter: Secondary | ICD-10-CM | POA: Diagnosis present

## 2022-02-26 DIAGNOSIS — W540XXA Bitten by dog, initial encounter: Secondary | ICD-10-CM | POA: Diagnosis not present

## 2022-02-26 DIAGNOSIS — M797 Fibromyalgia: Secondary | ICD-10-CM | POA: Diagnosis present

## 2022-02-26 DIAGNOSIS — S91351D Open bite, right foot, subsequent encounter: Secondary | ICD-10-CM | POA: Diagnosis not present

## 2022-02-26 DIAGNOSIS — E871 Hypo-osmolality and hyponatremia: Secondary | ICD-10-CM | POA: Diagnosis not present

## 2022-02-26 DIAGNOSIS — Z7901 Long term (current) use of anticoagulants: Secondary | ICD-10-CM | POA: Diagnosis not present

## 2022-02-26 DIAGNOSIS — Z79899 Other long term (current) drug therapy: Secondary | ICD-10-CM

## 2022-02-26 DIAGNOSIS — S81852A Open bite, left lower leg, initial encounter: Secondary | ICD-10-CM | POA: Diagnosis not present

## 2022-02-26 DIAGNOSIS — S91351A Open bite, right foot, initial encounter: Secondary | ICD-10-CM | POA: Diagnosis not present

## 2022-02-26 DIAGNOSIS — S51851A Open bite of right forearm, initial encounter: Secondary | ICD-10-CM | POA: Diagnosis not present

## 2022-02-26 DIAGNOSIS — E1152 Type 2 diabetes mellitus with diabetic peripheral angiopathy with gangrene: Secondary | ICD-10-CM | POA: Diagnosis not present

## 2022-02-26 DIAGNOSIS — L03116 Cellulitis of left lower limb: Secondary | ICD-10-CM | POA: Diagnosis not present

## 2022-02-26 DIAGNOSIS — Z6823 Body mass index (BMI) 23.0-23.9, adult: Secondary | ICD-10-CM

## 2022-02-26 DIAGNOSIS — S51802D Unspecified open wound of left forearm, subsequent encounter: Secondary | ICD-10-CM | POA: Insufficient documentation

## 2022-02-26 DIAGNOSIS — W540XXD Bitten by dog, subsequent encounter: Secondary | ICD-10-CM

## 2022-02-26 DIAGNOSIS — E039 Hypothyroidism, unspecified: Secondary | ICD-10-CM

## 2022-02-26 DIAGNOSIS — F411 Generalized anxiety disorder: Secondary | ICD-10-CM | POA: Diagnosis present

## 2022-02-26 DIAGNOSIS — I5032 Chronic diastolic (congestive) heart failure: Secondary | ICD-10-CM | POA: Diagnosis not present

## 2022-02-26 LAB — COMPREHENSIVE METABOLIC PANEL
ALT: 15 U/L (ref 0–44)
AST: 15 U/L (ref 15–41)
Albumin: 3.4 g/dL — ABNORMAL LOW (ref 3.5–5.0)
Alkaline Phosphatase: 76 U/L (ref 38–126)
Anion gap: 14 (ref 5–15)
BUN: 6 mg/dL (ref 6–20)
CO2: 22 mmol/L (ref 22–32)
Calcium: 9 mg/dL (ref 8.9–10.3)
Chloride: 93 mmol/L — ABNORMAL LOW (ref 98–111)
Creatinine, Ser: 0.63 mg/dL (ref 0.44–1.00)
GFR, Estimated: >60 mL/min (ref >60)
Glucose, Bld: 83 mg/dL (ref 70–99)
Potassium: 3.6 mmol/L (ref 3.5–5.1)
Sodium: 129 mmol/L — ABNORMAL LOW (ref 135–145)
Total Bilirubin: 0.3 mg/dL (ref 0.3–1.2)
Total Protein: 7 g/dL (ref 6.5–8.1)

## 2022-02-26 LAB — CBC WITH DIFFERENTIAL/PLATELET
Abs Immature Granulocytes: 0.04 10*3/uL (ref 0.00–0.07)
Basophils Absolute: 0.1 10*3/uL (ref 0.0–0.1)
Basophils Relative: 2 %
Eosinophils Absolute: 0.1 10*3/uL (ref 0.0–0.5)
Eosinophils Relative: 1 %
HCT: 30.4 % — ABNORMAL LOW (ref 36.0–46.0)
Hemoglobin: 10.2 g/dL — ABNORMAL LOW (ref 12.0–15.0)
Immature Granulocytes: 0 %
Lymphocytes Relative: 32 %
Lymphs Abs: 2.9 10*3/uL (ref 0.7–4.0)
MCH: 34.3 pg — ABNORMAL HIGH (ref 26.0–34.0)
MCHC: 33.6 g/dL (ref 30.0–36.0)
MCV: 102.4 fL — ABNORMAL HIGH (ref 80.0–100.0)
Monocytes Absolute: 0.7 10*3/uL (ref 0.1–1.0)
Monocytes Relative: 7 %
Neutro Abs: 5.2 10*3/uL (ref 1.7–7.7)
Neutrophils Relative %: 58 %
Platelets: 602 10*3/uL — ABNORMAL HIGH (ref 150–400)
RBC: 2.97 MIL/uL — ABNORMAL LOW (ref 3.87–5.11)
RDW: 13.6 % (ref 11.5–15.5)
WBC: 9 10*3/uL (ref 4.0–10.5)
nRBC: 0 % (ref 0.0–0.2)

## 2022-02-26 NOTE — ED Provider Triage Note (Signed)
Emergency Medicine Provider Triage Evaluation Note  [name] , a 61 y.o. female  was evaluated in triage.  Pt complains of possible wound infection.  Patient sustained several dog bites on 02/12/2022 which required suture repair.  She was evaluated by sports medicine provider last week and then again today sent to the ER for concern for ongoing infection due to her purulent drainage and foul-smelling drainage.  She completed her course of antibiotics initially.  Denies any fevers.  Review of Systems  Positive: Wound infection Negative: Fever  Physical Exam  BP (!) 177/74   Pulse 62   Temp 98.3 F (36.8 C) (Oral)   Resp 15   SpO2 98%  Gen:   Awake, no distress   Resp:  Normal effort  MSK:   Moves extremities without difficulty  Other:  Wounds wrapped  Medical Decision Making  Medically screening exam initiated at 6:42 PM.  Appropriate orders placed.  [name] was informed that the remainder of the evaluation will be completed by another provider, this initial triage assessment does not replace that evaluation, and the importance of remaining in the ED until their evaluation is complete.  Viewed images from office visit today.  I have ordered x-rays to evaluate for deeper infection as well as labs   Dietrich Pates, PA-C 02/26/22 1844

## 2022-02-26 NOTE — Progress Notes (Signed)
[name] - 61 y.o. female MRN [id number]  Date of birth: October 04, 1960  SUBJECTIVE:  Including CC & ROS.  No chief complaint on file.   [name] is a 61 y.o. female that is  following up for her dog bites on her left ankle and left forearm. She is having worsening pain with the left forearm and it is smelling. Having pain on the left medial malleolus as well.   Review of Systems See HPI   HISTORY: Past Medical, Surgical, Social, and Family History Reviewed & Updated per EMR.   Pertinent Historical Findings include:  Past Medical History:  Diagnosis Date   Back injury    Chronic   Bipolar disorder ([place])    Cardiomyopathy ([place])    hypertension controlled-helped with edema-Dr Nahser   Deafness    Left ear   Degenerative joint disease    with neck surgery   Depression    Diastolic dysfunction    with possible mild LVOT gradient   Dyspnea    Dysrhythmia    per patient, took too much naproxen which caused an "irregular heart beat"   Family history of adverse reaction to anesthesia    " my daughter has PONV and gets very grumpy"   Fibromyalgia    Former cigarette smoker    Gestational diabetes    H/O blood clots    due to side effect of medication   Headache(784.0)    migraines   Hypertension    Hypothyroidism    Leg cramps    Neuropathy    Pain    chronic    Pneumonia    PTSD (post-traumatic stress disorder)    Respiratory disorder    Schizophrenia ([place])     Past Surgical History:  Procedure Laterality Date   ABDOMINAL HYSTERECTOMY     partial   ANTERIOR CERVICAL DECOMP/DISCECTOMY FUSION N/A 01/30/2020   Procedure: Cervical Three-FourAnterior cervical decompression/discectomy/fusion with Hardware Removal.;  Surgeon: Kristeen Miss, MD;  Location: [facility];  Service: Neurosurgery;  Laterality: N/A;  anterior   APPENDECTOMY     CARDIAC CATHETERIZATION     Ejection Fraction 65-70%   CERVICAL DISC ARTHROPLASTY N/A 02/21/2016   Procedure: Cervical  three-four artificial disc replacement;  Surgeon: Kristeen Miss, MD;  Location: [place] [facility];  Service: Neurosurgery;  Laterality: N/A;   CERVICAL FUSION     C5-6   EXTERNAL EAR SURGERY     tumor at age 46, 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   HERNIA REPAIR     " as a baby"   NECK SURGERY     OTHER SURGICAL HISTORY     History of cervical and lumbar disk surgery   PERCUTANEOUS PINNING Left 12/01/2019   Procedure: Closed reduction percutaneous pinning left ring finger proximal phalanx fracture;  Surgeon: Cindra Presume, MD;  Location: [facility];  Service: Plastics;  Laterality: Left;   polypectomies     tubes   POSTERIOR CERVICAL LAMINECTOMY WITH MET- RX Left 06/13/2018   Procedure: Left Cervical three-four Laminotomy/foraminotomy;  Surgeon: Kristeen Miss, MD;  Location: [facility];  Service: Neurosurgery;  Laterality: Left;   TONSILLECTOMY     TONSILLECTOMY AND ADENOIDECTOMY       PHYSICAL EXAM:  VS: BP 108/60 (BP Location: Left Arm, Patient Position: Sitting)   Ht $R'5\' 6"'Fu$  (1.676 m)   Wt 148 lb (67.1 kg)   BMI 23.89 kg/m  Physical Exam Gen: NAD, alert, cooperative with exam, well-appearing MSK:  Neurovascularly intact  ASSESSMENT & PLAN:   Wound, open, arm, forearm, left, subsequent encounter Initial injury on 6/22.  Was dressed and having improvement in the ecchymosis but the wound appears to be infected.  -Counseled on supportive care. -Advised to be seen in the emergency department for the consideration of surgical debridement.  Dog bite of right foot Appears to be improving with no signs of infection. -Advised to continue the postop shoe.

## 2022-02-26 NOTE — ED Triage Notes (Signed)
Pt c/o pain & redness to dog bites, concern for infection. Pt w wounds to L forearm, L ankle sutures to L ankle. Increased pain & foul smell from L forearm, increased pain to both L forearm & L ankle. Pt denies fevers, SHOB

## 2022-02-26 NOTE — Patient Instructions (Signed)
Good to see you  Please send me a message in MyChart with any questions or updates.  Please be seen in the Emergency department today. .   --Dr. Jordan Likes

## 2022-02-26 NOTE — Assessment & Plan Note (Signed)
Appears to be improving with no signs of infection. -Advised to continue the postop shoe.

## 2022-02-26 NOTE — Assessment & Plan Note (Signed)
Initial injury on 6/22.  Was dressed and having improvement in the ecchymosis but the wound appears to be infected.  -Counseled on supportive care. -Advised to be seen in the emergency department for the consideration of surgical debridement.

## 2022-02-27 ENCOUNTER — Encounter (HOSPITAL_COMMUNITY): Payer: Self-pay | Admitting: Internal Medicine

## 2022-02-27 ENCOUNTER — Inpatient Hospital Stay (HOSPITAL_COMMUNITY): Payer: BLUE CROSS/BLUE SHIELD | Admitting: Anesthesiology

## 2022-02-27 ENCOUNTER — Emergency Department (HOSPITAL_COMMUNITY): Payer: BLUE CROSS/BLUE SHIELD

## 2022-02-27 ENCOUNTER — Other Ambulatory Visit: Payer: Self-pay

## 2022-02-27 ENCOUNTER — Encounter (HOSPITAL_COMMUNITY)
Admission: EM | Disposition: A | Discharge status: Home / Self Care | Payer: Self-pay | Source: Home / Self Care | Attending: Internal Medicine

## 2022-02-27 DIAGNOSIS — L03114 Cellulitis of left upper limb: Secondary | ICD-10-CM | POA: Diagnosis present

## 2022-02-27 DIAGNOSIS — E039 Hypothyroidism, unspecified: Secondary | ICD-10-CM | POA: Diagnosis present

## 2022-02-27 DIAGNOSIS — E861 Hypovolemia: Secondary | ICD-10-CM | POA: Diagnosis present

## 2022-02-27 DIAGNOSIS — M25561 Pain in right knee: Secondary | ICD-10-CM | POA: Diagnosis present

## 2022-02-27 DIAGNOSIS — I96 Gangrene, not elsewhere classified: Secondary | ICD-10-CM | POA: Diagnosis present

## 2022-02-27 DIAGNOSIS — Z7901 Long term (current) use of anticoagulants: Secondary | ICD-10-CM | POA: Diagnosis not present

## 2022-02-27 DIAGNOSIS — Z833 Family history of diabetes mellitus: Secondary | ICD-10-CM | POA: Diagnosis not present

## 2022-02-27 DIAGNOSIS — E871 Hypo-osmolality and hyponatremia: Secondary | ICD-10-CM

## 2022-02-27 DIAGNOSIS — M797 Fibromyalgia: Secondary | ICD-10-CM | POA: Diagnosis present

## 2022-02-27 DIAGNOSIS — I5032 Chronic diastolic (congestive) heart failure: Secondary | ICD-10-CM | POA: Diagnosis present

## 2022-02-27 DIAGNOSIS — W540XXD Bitten by dog, subsequent encounter: Secondary | ICD-10-CM | POA: Diagnosis not present

## 2022-02-27 DIAGNOSIS — T148XXA Other injury of unspecified body region, initial encounter: Secondary | ICD-10-CM | POA: Diagnosis present

## 2022-02-27 DIAGNOSIS — L089 Local infection of the skin and subcutaneous tissue, unspecified: Secondary | ICD-10-CM

## 2022-02-27 DIAGNOSIS — Z86718 Personal history of other venous thrombosis and embolism: Secondary | ICD-10-CM

## 2022-02-27 DIAGNOSIS — D75839 Thrombocytosis, unspecified: Secondary | ICD-10-CM | POA: Diagnosis present

## 2022-02-27 DIAGNOSIS — W540XXA Bitten by dog, initial encounter: Secondary | ICD-10-CM | POA: Diagnosis present

## 2022-02-27 DIAGNOSIS — Z820 Family history of epilepsy and other diseases of the nervous system: Secondary | ICD-10-CM | POA: Diagnosis not present

## 2022-02-27 DIAGNOSIS — S91052A Open bite, left ankle, initial encounter: Secondary | ICD-10-CM | POA: Diagnosis present

## 2022-02-27 DIAGNOSIS — I10 Essential (primary) hypertension: Secondary | ICD-10-CM

## 2022-02-27 DIAGNOSIS — Z79890 Hormone replacement therapy (postmenopausal): Secondary | ICD-10-CM | POA: Diagnosis not present

## 2022-02-27 DIAGNOSIS — S51852A Open bite of left forearm, initial encounter: Secondary | ICD-10-CM | POA: Diagnosis present

## 2022-02-27 DIAGNOSIS — F319 Bipolar disorder, unspecified: Secondary | ICD-10-CM | POA: Diagnosis present

## 2022-02-27 DIAGNOSIS — F411 Generalized anxiety disorder: Secondary | ICD-10-CM | POA: Diagnosis present

## 2022-02-27 DIAGNOSIS — D539 Nutritional anemia, unspecified: Secondary | ICD-10-CM

## 2022-02-27 DIAGNOSIS — F17210 Nicotine dependence, cigarettes, uncomplicated: Secondary | ICD-10-CM | POA: Diagnosis present

## 2022-02-27 DIAGNOSIS — I110 Hypertensive heart disease with heart failure: Secondary | ICD-10-CM | POA: Diagnosis present

## 2022-02-27 DIAGNOSIS — Z8249 Family history of ischemic heart disease and other diseases of the circulatory system: Secondary | ICD-10-CM | POA: Diagnosis not present

## 2022-02-27 DIAGNOSIS — E440 Moderate protein-calorie malnutrition: Secondary | ICD-10-CM | POA: Diagnosis present

## 2022-02-27 HISTORY — PX: I & D EXTREMITY: SHX5045

## 2022-02-27 LAB — SURGICAL PCR SCREEN
MRSA, PCR: NEGATIVE
Staphylococcus aureus: NEGATIVE

## 2022-02-27 LAB — CBC WITH DIFFERENTIAL/PLATELET
Abs Immature Granulocytes: 0.03 10*3/uL (ref 0.00–0.07)
Basophils Absolute: 0.1 10*3/uL (ref 0.0–0.1)
Basophils Relative: 2 %
Eosinophils Absolute: 0.1 10*3/uL (ref 0.0–0.5)
Eosinophils Relative: 1 %
HCT: 27.1 % — ABNORMAL LOW (ref 36.0–46.0)
Hemoglobin: 9.4 g/dL — ABNORMAL LOW (ref 12.0–15.0)
Immature Granulocytes: 0 %
Lymphocytes Relative: 19 %
Lymphs Abs: 1.6 10*3/uL (ref 0.7–4.0)
MCH: 34.2 pg — ABNORMAL HIGH (ref 26.0–34.0)
MCHC: 34.7 g/dL (ref 30.0–36.0)
MCV: 98.5 fL (ref 80.0–100.0)
Monocytes Absolute: 0.7 10*3/uL (ref 0.1–1.0)
Monocytes Relative: 8 %
Neutro Abs: 5.7 10*3/uL (ref 1.7–7.7)
Neutrophils Relative %: 70 %
Platelets: 550 10*3/uL — ABNORMAL HIGH (ref 150–400)
RBC: 2.75 MIL/uL — ABNORMAL LOW (ref 3.87–5.11)
RDW: 13.6 % (ref 11.5–15.5)
WBC: 8.1 10*3/uL (ref 4.0–10.5)
nRBC: 0 % (ref 0.0–0.2)

## 2022-02-27 LAB — COMPREHENSIVE METABOLIC PANEL
ALT: 14 U/L (ref 0–44)
AST: 12 U/L — ABNORMAL LOW (ref 15–41)
Albumin: 3 g/dL — ABNORMAL LOW (ref 3.5–5.0)
Alkaline Phosphatase: 68 U/L (ref 38–126)
Anion gap: 13 (ref 5–15)
BUN: 6 mg/dL (ref 6–20)
CO2: 23 mmol/L (ref 22–32)
Calcium: 8.7 mg/dL — ABNORMAL LOW (ref 8.9–10.3)
Chloride: 97 mmol/L — ABNORMAL LOW (ref 98–111)
Creatinine, Ser: 0.68 mg/dL (ref 0.44–1.00)
GFR, Estimated: >60 mL/min (ref >60)
Glucose, Bld: 117 mg/dL — ABNORMAL HIGH (ref 70–99)
Potassium: 3.8 mmol/L (ref 3.5–5.1)
Sodium: 133 mmol/L — ABNORMAL LOW (ref 135–145)
Total Bilirubin: 0.6 mg/dL (ref 0.3–1.2)
Total Protein: 6 g/dL — ABNORMAL LOW (ref 6.5–8.1)

## 2022-02-27 LAB — VITAMIN B12: Vitamin B-12: 433 pg/mL (ref 180–914)

## 2022-02-27 LAB — PHOSPHORUS: Phosphorus: 3.4 mg/dL (ref 2.5–4.6)

## 2022-02-27 LAB — MAGNESIUM: Magnesium: 2 mg/dL (ref 1.7–2.4)

## 2022-02-27 LAB — FOLATE: Folate: 18.1 ng/mL (ref >5.9)

## 2022-02-27 SURGERY — IRRIGATION AND DEBRIDEMENT EXTREMITY
Anesthesia: General | Laterality: Left

## 2022-02-27 MED ORDER — LIDOCAINE 2% (20 MG/ML) 5 ML SYRINGE
INTRAMUSCULAR | Status: AC
  Filled 2022-02-27: qty 5

## 2022-02-27 MED ORDER — VANCOMYCIN HCL IN DEXTROSE 1-5 GM/200ML-% IV SOLN: 1000.0000 mg | INTRAVENOUS | Status: DC

## 2022-02-27 MED ORDER — ONDANSETRON HCL 4 MG/2ML IJ SOLN
INTRAMUSCULAR | Status: DC | PRN
  Administered 2022-02-27: 4 mg via INTRAVENOUS

## 2022-02-27 MED ORDER — FENTANYL CITRATE (PF) 100 MCG/2ML IJ SOLN
INTRAMUSCULAR | Status: AC
  Filled 2022-02-27: qty 2

## 2022-02-27 MED ORDER — DEXAMETHASONE SODIUM PHOSPHATE 10 MG/ML IJ SOLN
INTRAMUSCULAR | Status: AC
  Filled 2022-02-27: qty 1

## 2022-02-27 MED ORDER — PROPOFOL 10 MG/ML IV BOLUS
INTRAVENOUS | Status: DC | PRN
  Administered 2022-02-27: 140 mg via INTRAVENOUS

## 2022-02-27 MED ORDER — ACETAMINOPHEN 160 MG/5ML PO SOLN: 325.0000 mg | ORAL | Status: DC | PRN

## 2022-02-27 MED ORDER — VANCOMYCIN HCL 1250 MG/250ML IV SOLN
1250.0000 mg | Freq: Once | INTRAVENOUS | Status: AC
  Administered 2022-02-27: 1250 mg via INTRAVENOUS
  Filled 2022-02-27: qty 250

## 2022-02-27 MED ORDER — CITALOPRAM HYDROBROMIDE 20 MG PO TABS
20.0000 mg | ORAL_TABLET | Freq: Every day | ORAL | Status: DC
  Administered 2022-02-27 – 2022-02-28 (×2): 20 mg via ORAL
  Filled 2022-02-27: qty 1
  Filled 2022-02-27: qty 2

## 2022-02-27 MED ORDER — FENTANYL CITRATE PF 50 MCG/ML IJ SOSY
50.0000 ug | PREFILLED_SYRINGE | Freq: Once | INTRAMUSCULAR | Status: AC
  Administered 2022-02-27: 50 ug via INTRAVENOUS
  Filled 2022-02-27: qty 1

## 2022-02-27 MED ORDER — FENTANYL CITRATE (PF) 250 MCG/5ML IJ SOLN
INTRAMUSCULAR | Status: AC
  Filled 2022-02-27: qty 5

## 2022-02-27 MED ORDER — OXYCODONE HCL 5 MG PO TABS: 5.0000 mg | ORAL_TABLET | Freq: Once | ORAL | Status: DC | PRN

## 2022-02-27 MED ORDER — 0.9 % SODIUM CHLORIDE (POUR BTL) OPTIME
TOPICAL | Status: DC | PRN
  Administered 2022-02-27: 1000 mL

## 2022-02-27 MED ORDER — MEPERIDINE HCL 25 MG/ML IJ SOLN: 6.2500 mg | INTRAMUSCULAR | Status: DC | PRN

## 2022-02-27 MED ORDER — FENTANYL CITRATE (PF) 100 MCG/2ML IJ SOLN
25.0000 ug | INTRAMUSCULAR | Status: DC | PRN
  Administered 2022-02-27 (×2): 25 ug via INTRAVENOUS

## 2022-02-27 MED ORDER — LIDOCAINE 2% (20 MG/ML) 5 ML SYRINGE
INTRAMUSCULAR | Status: DC | PRN
  Administered 2022-02-27: 100 mg via INTRAVENOUS

## 2022-02-27 MED ORDER — ACETAMINOPHEN 325 MG PO TABS: 650.0000 mg | ORAL_TABLET | Freq: Four times a day (QID) | ORAL | Status: DC | PRN

## 2022-02-27 MED ORDER — ORAL CARE MOUTH RINSE: 15.0000 mL | Freq: Once | OROMUCOSAL | Status: AC

## 2022-02-27 MED ORDER — ALPRAZOLAM 0.5 MG PO TABS
1.0000 mg | ORAL_TABLET | Freq: Two times a day (BID) | ORAL | Status: DC | PRN
Start: 2022-02-27 — End: 2022-02-28
  Administered 2022-02-27: 1 mg via ORAL
  Filled 2022-02-27: qty 2

## 2022-02-27 MED ORDER — CHLORHEXIDINE GLUCONATE 0.12 % MT SOLN
OROMUCOSAL | Status: AC
  Administered 2022-02-27: 15 mL via OROMUCOSAL
  Filled 2022-02-27: qty 15

## 2022-02-27 MED ORDER — ONDANSETRON HCL 4 MG/2ML IJ SOLN
4.0000 mg | Freq: Once | INTRAMUSCULAR | Status: DC | PRN
Start: 2022-02-27 — End: 2022-02-27

## 2022-02-27 MED ORDER — MIDAZOLAM HCL 2 MG/2ML IJ SOLN
INTRAMUSCULAR | Status: AC
  Filled 2022-02-27: qty 2

## 2022-02-27 MED ORDER — HYDROMORPHONE HCL 1 MG/ML IJ SOLN
0.5000 mg | INTRAMUSCULAR | Status: DC | PRN
  Administered 2022-02-27 (×2): 0.5 mg via INTRAVENOUS
  Filled 2022-02-27 (×2): qty 1

## 2022-02-27 MED ORDER — CHLORHEXIDINE GLUCONATE 0.12 % MT SOLN: 15.0000 mL | Freq: Once | OROMUCOSAL | Status: AC

## 2022-02-27 MED ORDER — SODIUM CHLORIDE 0.9 % IV SOLN: INTRAVENOUS | Status: DC

## 2022-02-27 MED ORDER — BUSPIRONE HCL 5 MG PO TABS
15.0000 mg | ORAL_TABLET | Freq: Three times a day (TID) | ORAL | Status: DC
  Administered 2022-02-27 – 2022-02-28 (×3): 15 mg via ORAL
  Filled 2022-02-27: qty 2
  Filled 2022-02-27 (×2): qty 1

## 2022-02-27 MED ORDER — ASCORBIC ACID 500 MG PO TABS
1000.0000 mg | ORAL_TABLET | Freq: Every day | ORAL | Status: DC
  Administered 2022-02-27 – 2022-02-28 (×2): 1000 mg via ORAL
  Filled 2022-02-27 (×2): qty 2

## 2022-02-27 MED ORDER — BUPIVACAINE HCL (PF) 0.25 % IJ SOLN
INTRAMUSCULAR | Status: AC
Start: 2022-02-27 — End: ?
  Filled 2022-02-27: qty 30

## 2022-02-27 MED ORDER — ONDANSETRON HCL 4 MG/2ML IJ SOLN
INTRAMUSCULAR | Status: AC
  Filled 2022-02-27: qty 2

## 2022-02-27 MED ORDER — CEFAZOLIN SODIUM-DEXTROSE 2-4 GM/100ML-% IV SOLN
2.0000 g | INTRAVENOUS | Status: DC
  Filled 2022-02-27: qty 100

## 2022-02-27 MED ORDER — CLINDAMYCIN PHOSPHATE 900 MG/50ML IV SOLN
900.0000 mg | Freq: Four times a day (QID) | INTRAVENOUS | Status: DC
  Administered 2022-02-27 – 2022-02-28 (×4): 900 mg via INTRAVENOUS
  Filled 2022-02-27 (×5): qty 50

## 2022-02-27 MED ORDER — MIDAZOLAM HCL 2 MG/2ML IJ SOLN
INTRAMUSCULAR | Status: AC
  Administered 2022-02-27: 2 mg via INTRAVENOUS
  Filled 2022-02-27: qty 2

## 2022-02-27 MED ORDER — ENOXAPARIN SODIUM 40 MG/0.4ML IJ SOSY: 40.0000 mg | PREFILLED_SYRINGE | INTRAMUSCULAR | Status: DC

## 2022-02-27 MED ORDER — EPHEDRINE 5 MG/ML INJ
INTRAVENOUS | Status: AC
Start: 2022-02-27 — End: ?
  Filled 2022-02-27: qty 5

## 2022-02-27 MED ORDER — HYDRALAZINE HCL 25 MG PO TABS
25.0000 mg | ORAL_TABLET | ORAL | Status: DC | PRN
  Administered 2022-02-27: 25 mg via ORAL
  Filled 2022-02-27: qty 1

## 2022-02-27 MED ORDER — LACTATED RINGERS IV SOLN: INTRAVENOUS | Status: DC

## 2022-02-27 MED ORDER — PROCHLORPERAZINE EDISYLATE 10 MG/2ML IJ SOLN
10.0000 mg | Freq: Four times a day (QID) | INTRAMUSCULAR | Status: DC | PRN
Start: 2022-02-27 — End: 2022-02-28

## 2022-02-27 MED ORDER — LABETALOL HCL 5 MG/ML IV SOLN: 10.0000 mg | INTRAVENOUS | Status: DC | PRN

## 2022-02-27 MED ORDER — FENTANYL CITRATE (PF) 100 MCG/2ML IJ SOLN
INTRAMUSCULAR | Status: DC | PRN
  Administered 2022-02-27 (×2): 25 ug via INTRAVENOUS

## 2022-02-27 MED ORDER — LIDOCAINE 2% (20 MG/ML) 5 ML SYRINGE
INTRAMUSCULAR | Status: AC
Start: 2022-02-27 — End: ?
  Filled 2022-02-27: qty 5

## 2022-02-27 MED ORDER — PHENYLEPHRINE 80 MCG/ML (10ML) SYRINGE FOR IV PUSH (FOR BLOOD PRESSURE SUPPORT)
PREFILLED_SYRINGE | INTRAVENOUS | Status: AC
  Filled 2022-02-27: qty 20

## 2022-02-27 MED ORDER — ACETAMINOPHEN 325 MG PO TABS: 325.0000 mg | ORAL_TABLET | ORAL | Status: DC | PRN

## 2022-02-27 MED ORDER — DEXAMETHASONE SODIUM PHOSPHATE 10 MG/ML IJ SOLN
INTRAMUSCULAR | Status: DC | PRN
  Administered 2022-02-27: 5 mg via INTRAVENOUS

## 2022-02-27 MED ORDER — POVIDONE-IODINE 10 % EX SWAB
2.0000 | Freq: Once | CUTANEOUS | Status: AC
  Administered 2022-02-27: 2 via TOPICAL

## 2022-02-27 MED ORDER — LEVOTHYROXINE SODIUM 50 MCG PO TABS
50.0000 ug | ORAL_TABLET | Freq: Every day | ORAL | Status: DC
  Filled 2022-02-27: qty 2
  Filled 2022-02-27: qty 1

## 2022-02-27 MED ORDER — SODIUM CHLORIDE 0.9 % IV SOLN: 2.0000 g | INTRAVENOUS | Status: DC

## 2022-02-27 MED ORDER — RIVAROXABAN 10 MG PO TABS: 20.0000 mg | ORAL_TABLET | Freq: Every day | ORAL | Status: DC

## 2022-02-27 MED ORDER — IOHEXOL 300 MG/ML  SOLN
100.0000 mL | Freq: Once | INTRAMUSCULAR | Status: AC | PRN
Start: 2022-02-27 — End: 2022-02-27
  Administered 2022-02-27: 90 mL via INTRAVENOUS

## 2022-02-27 MED ORDER — PROPOFOL 10 MG/ML IV BOLUS
INTRAVENOUS | Status: AC
  Filled 2022-02-27: qty 20

## 2022-02-27 MED ORDER — RISPERIDONE 1 MG PO TABS
1.0000 mg | ORAL_TABLET | Freq: Every day | ORAL | Status: DC
  Administered 2022-02-27: 1 mg via ORAL
  Filled 2022-02-27 (×2): qty 1

## 2022-02-27 MED ORDER — METOPROLOL TARTRATE 12.5 MG HALF TABLET
ORAL_TABLET | ORAL | Status: AC
  Filled 2022-02-27: qty 2

## 2022-02-27 MED ORDER — OXYCODONE HCL 5 MG/5ML PO SOLN: 5.0000 mg | Freq: Once | ORAL | Status: DC | PRN

## 2022-02-27 MED ORDER — METOPROLOL TARTRATE 25 MG PO TABS
25.0000 mg | ORAL_TABLET | Freq: Once | ORAL | Status: AC
Start: 2022-02-27 — End: 2022-02-27
  Administered 2022-02-27: 25 mg via ORAL

## 2022-02-27 MED ORDER — LACTATED RINGERS IV BOLUS
1000.0000 mL | Freq: Once | INTRAVENOUS | Status: AC
  Administered 2022-02-27: 1000 mL via INTRAVENOUS

## 2022-02-27 MED ORDER — SODIUM CHLORIDE 0.9 % IV SOLN
1.0000 g | Freq: Three times a day (TID) | INTRAVENOUS | Status: DC
  Administered 2022-02-27 – 2022-02-28 (×4): 1 g via INTRAVENOUS
  Filled 2022-02-27 (×5): qty 20

## 2022-02-27 MED ORDER — MIDAZOLAM HCL 2 MG/2ML IJ SOLN
INTRAMUSCULAR | Status: DC | PRN
  Administered 2022-02-27: 1 mg via INTRAVENOUS

## 2022-02-27 MED ORDER — BUPIVACAINE HCL (PF) 0.25 % IJ SOLN
INTRAMUSCULAR | Status: DC | PRN
  Administered 2022-02-27: 10 mL

## 2022-02-27 MED ORDER — HYDROMORPHONE HCL 1 MG/ML IJ SOLN
0.5000 mg | INTRAMUSCULAR | Status: DC | PRN
  Administered 2022-02-27 – 2022-02-28 (×10): 0.5 mg via INTRAVENOUS
  Filled 2022-02-27 (×2): qty 0.5
  Filled 2022-02-27 (×2): qty 1
  Filled 2022-02-27: qty 0.5
  Filled 2022-02-27: qty 1
  Filled 2022-02-27 (×4): qty 0.5

## 2022-02-27 MED ORDER — ALPRAZOLAM 1 MG PO TABS: 1.0000 mg | ORAL_TABLET | Freq: Two times a day (BID) | ORAL | Status: DC | PRN

## 2022-02-27 MED ORDER — CEFAZOLIN SODIUM-DEXTROSE 2-4 GM/100ML-% IV SOLN
INTRAVENOUS | Status: AC
  Filled 2022-02-27: qty 100

## 2022-02-27 MED ORDER — CHLORHEXIDINE GLUCONATE 4 % EX LIQD: 60.0000 mL | Freq: Once | CUTANEOUS | Status: DC

## 2022-02-27 MED ORDER — MIDAZOLAM HCL 2 MG/2ML IJ SOLN
2.0000 mg | Freq: Once | INTRAMUSCULAR | Status: AC | PRN
Start: 2022-02-27 — End: 2022-02-27

## 2022-02-27 SURGICAL SUPPLY — 64 items
ADAPTER CATH SYR TO TUBING 38M (ADAPTER) ×3 IMPLANT
ADPR CATH LL SYR 3/32 TPR (ADAPTER) ×1
BAG COUNTER SPONGE SURGICOUNT (BAG) ×3 IMPLANT
BAG SPNG CNTER NS LX DISP (BAG) ×1
BNDG CMPR 9X4 STRL LF SNTH (GAUZE/BANDAGES/DRESSINGS)
BNDG COHESIVE 2X5 TAN STRL LF (GAUZE/BANDAGES/DRESSINGS) IMPLANT
BNDG ELASTIC 3X5.8 VLCR STR LF (GAUZE/BANDAGES/DRESSINGS) ×3 IMPLANT
BNDG ELASTIC 4X5.8 VLCR STR LF (GAUZE/BANDAGES/DRESSINGS) ×3 IMPLANT
BNDG ESMARK 4X9 LF (GAUZE/BANDAGES/DRESSINGS) IMPLANT
BNDG GAUZE DERMACEA FLUFF (GAUZE/BANDAGES/DRESSINGS) ×1
BNDG GAUZE DERMACEA FLUFF 4 (GAUZE/BANDAGES/DRESSINGS) IMPLANT
BNDG GAUZE ELAST 4 BULKY (GAUZE/BANDAGES/DRESSINGS) ×3 IMPLANT
BNDG GZE DERMACEA 4 6PLY (GAUZE/BANDAGES/DRESSINGS) ×1
CANNULA VESSEL 3MM 2 BLNT TIP (CANNULA) IMPLANT
CORD BIPOLAR FORCEPS 12FT (ELECTRODE) ×3 IMPLANT
COVER SURGICAL LIGHT HANDLE (MISCELLANEOUS) ×3 IMPLANT
CUFF TOURN SGL QUICK 18X4 (TOURNIQUET CUFF) IMPLANT
CUFF TOURN SGL QUICK 24 (TOURNIQUET CUFF)
CUFF TRNQT CYL 24X4X16.5-23 (TOURNIQUET CUFF) IMPLANT
DRAIN PENROSE 1/4X12 LTX STRL (WOUND CARE) IMPLANT
DRSG PAD ABDOMINAL 8X10 ST (GAUZE/BANDAGES/DRESSINGS) ×5 IMPLANT
GAUZE PACKING IODOFORM 1/2 (PACKING) ×1 IMPLANT
GAUZE SPONGE 4X4 12PLY STRL (GAUZE/BANDAGES/DRESSINGS) ×3 IMPLANT
GAUZE XEROFORM 1X8 LF (GAUZE/BANDAGES/DRESSINGS) ×3 IMPLANT
GAUZE XEROFORM 5X9 LF (GAUZE/BANDAGES/DRESSINGS) ×1 IMPLANT
GLOVE BIO SURGEON STRL SZ7.5 (GLOVE) ×3 IMPLANT
GLOVE BIOGEL PI IND STRL 8 (GLOVE) ×2 IMPLANT
GLOVE BIOGEL PI IND STRL 8.5 (GLOVE) IMPLANT
GLOVE BIOGEL PI INDICATOR 8 (GLOVE) ×1
GLOVE BIOGEL PI INDICATOR 8.5 (GLOVE)
GLOVE BIOGEL PI ORTHO PRO SZ8 (GLOVE)
GLOVE PI ORTHO PRO STRL SZ8 (GLOVE) IMPLANT
GLOVE SURG ORTHO 8.0 STRL STRW (GLOVE) IMPLANT
GOWN STRL REUS W/ TWL LRG LVL3 (GOWN DISPOSABLE) ×2 IMPLANT
GOWN STRL REUS W/ TWL XL LVL3 (GOWN DISPOSABLE) ×2 IMPLANT
GOWN STRL REUS W/TWL LRG LVL3 (GOWN DISPOSABLE) ×2
GOWN STRL REUS W/TWL XL LVL3 (GOWN DISPOSABLE) ×2
KIT BASIN OR (CUSTOM PROCEDURE TRAY) ×3 IMPLANT
KIT TURNOVER KIT B (KITS) ×3 IMPLANT
LOOP VESSEL MAXI BLUE (MISCELLANEOUS) IMPLANT
MANIFOLD NEPTUNE II (INSTRUMENTS) IMPLANT
NDL HYPO 25X1 1.5 SAFETY (NEEDLE) IMPLANT
NEEDLE HYPO 25X1 1.5 SAFETY (NEEDLE) IMPLANT
NS IRRIG 1000ML POUR BTL (IV SOLUTION) ×3 IMPLANT
PACK ORTHO EXTREMITY (CUSTOM PROCEDURE TRAY) ×3 IMPLANT
PAD ARMBOARD 7.5X6 YLW CONV (MISCELLANEOUS) ×6 IMPLANT
SET CYSTO W/LG BORE CLAMP LF (SET/KITS/TRAYS/PACK) IMPLANT
SOL PREP POV-IOD 4OZ 10% (MISCELLANEOUS) ×6 IMPLANT
SPIKE FLUID TRANSFER (MISCELLANEOUS) ×3 IMPLANT
SPLINT PLASTER EXTRA FAST 3X15 (CAST SUPPLIES) ×1
SPLINT PLASTER GYPS XFAST 3X15 (CAST SUPPLIES) IMPLANT
SPONGE T-LAP 4X18 ~~LOC~~+RFID (SPONGE) ×3 IMPLANT
SUT ETHILON 4 0 P 3 18 (SUTURE) IMPLANT
SUT ETHILON 4 0 PS 2 18 (SUTURE) IMPLANT
SUT MON AB 5-0 P3 18 (SUTURE) IMPLANT
SWAB COLLECTION DEVICE MRSA (MISCELLANEOUS) IMPLANT
SWAB CULTURE ESWAB REG 1ML (MISCELLANEOUS) IMPLANT
SYR 20ML LL LF (SYRINGE) ×3 IMPLANT
SYR CONTROL 10ML LL (SYRINGE) IMPLANT
TOWEL GREEN STERILE (TOWEL DISPOSABLE) ×3 IMPLANT
TUBE CONNECTING 12X1/4 (SUCTIONS) ×3 IMPLANT
TUBE FEEDING ENTERAL 5FR 16IN (TUBING) IMPLANT
UNDERPAD 30X36 HEAVY ABSORB (UNDERPADS AND DIAPERS) ×3 IMPLANT
YANKAUER SUCT BULB TIP NO VENT (SUCTIONS) ×3 IMPLANT

## 2022-02-27 NOTE — ED Notes (Signed)
Patient transported to CT 

## 2022-02-27 NOTE — Assessment & Plan Note (Signed)
Continue Risperdal.

## 2022-02-27 NOTE — Op Note (Signed)
NAME: [name] MEDICAL RECORD NO: [id number] DATE OF BIRTH: Jun 09, 1961 FACILITY: [facility] LOCATION: [facility] OR PHYSICIAN: Tami Ribas, MD   OPERATIVE REPORT   DATE OF PROCEDURE: 02/27/22    PREOPERATIVE DIAGNOSIS: Left forearm infected dog bite with necrosis   POSTOPERATIVE DIAGNOSIS: Left forearm infected dog bite with necrosis   PROCEDURE: Irrigation and debridement left forearm dog bite including skin subcutaneous tissues and muscle approximately 7 x 4 cm   SURGEON:  Betha Loa, M.D.   ASSISTANT: none   ANESTHESIA:  General   INTRAVENOUS FLUIDS:  Per anesthesia flow sheet.   ESTIMATED BLOOD LOSS:  Minimal.   COMPLICATIONS:  None.   SPECIMENS: Cultures to micro   TOURNIQUET TIME:    Total Tourniquet Time Documented: Upper Arm (Left) - 33 minutes Total: Upper Arm (Left) - 33 minutes    DISPOSITION:  Stable to PACU.   INDICATIONS: 61 year old female states she was bitten on the left forearm by her dogs approximate 2 weeks ago.  She was seen in the emergency department where the wounds were cleaned and sutured.  She was started on antibiotics.  She states the wounds were healing until a couple of days ago when they started to breakdown.  She had wound breakdown and purulent drainage when she visited her primary care physician yesterday and was sent to the emergency room for further care.  No fevers chills or night sweats.  I recommended irrigation and debridement of the wound in the operating room.  Risks, benefits and alternatives of surgery were discussed including the risks of blood loss, infection, damage to nerves, vessels, tendons, ligaments, bone for surgery, need for additional surgery, complications with wound healing, continued pain, stiffness, , need for repeat irrigation and debridement.  She voiced understanding of these risks and elected to proceed.  OPERATIVE COURSE:  After being identified preoperatively by myself,  the patient and I agreed on the  procedure and site of the procedure.  The surgical site was marked.  Surgical consent had been signed.  She is on scheduled antibiotics.  She was transferred to the operating room and placed on the operating table in supine position with the Left upper extremity on an arm board.  General anesthesia was induced by the anesthesiologist.  Left upper extremity was prepped and draped in normal sterile orthopedic fashion.  A surgical pause was performed between the surgeons, anesthesia, and operating room staff and all were in agreement as to the patient, procedure, and site of procedure.  Tourniquet at the proximal aspect of the extremity was inflated to 250 mmHg after exsanguination of the arm with an Esmarch bandage.  The wound was explored.  There was no gross purulence.  There was necrotic area of tissue and the ulnar side of the wound.  There was exposed neurovascular structures in the subcutaneous tissues.  These were friable and brittle and broke during the debridement.  The ends were bipolared and allowed to retract.  The wound at the central and radial aspect did not go deep to the fascia.  The FCR muscle was seen underneath the fascia.  The fibrinous tissue over top was debrided with a knife and Ray-Tec sponge.  Skin edges were debrided of any devitalized tissue including skin and subcutaneous tissues.  There was some what appeared to be Vicryl suture remaining which was also removed.  There was a puncture wound at the proximal ulnar aspect which was incorporated into the skin edge debridement to create a larger wound with  a more stable edge.  The wound tracked deeper at the ulnar side and went down into the FDS muscle.  The FDP muscle was noted at the floor of the cavity.  There was contaminated hematoma.  This was removed.  A curette was used to debride the muscle of any devitalized appearing tissue.  The muscle overall appeared viable.  Once all devitalized tissue had been removed the wound was copiously  irrigated with sterile saline by cystoscopy tubing.  3000 cc of saline was used.  The wound was then packed with half-inch iodoform gauze including the cavity at the FDS muscle.  Wound edges were injected with quarter percent plain Marcaine to aid in postoperative analgesia.  The wound was dressed with sterile Xeroform at the edges with the center open and allowed to drain, 4 x 4 and ABD and wrapped with a Kerlix bandage.  Volar splint was placed and wrapped with Kerlix and Ace bandage.  The tourniquet was deflated at 33 minutes.  Fingertips were pink with brisk capillary refill after deflation of tourniquet.  The operative  drapes were broken down.  The patient was awoken from anesthesia safely.  She was transferred back to the stretcher and taken to PACU in stable condition.  She is currently admitted to the hospitalist for IV antibiotics.  We will start hydrotherapy in 2 to 4 days.  Follow-up in the office.  Betha Loa, MD Electronically signed, 02/27/22

## 2022-02-27 NOTE — ED Notes (Signed)
Pt c/o of increasing pain. MD Margo Aye stated pt can have another dose of dilaudid.

## 2022-02-27 NOTE — Assessment & Plan Note (Addendum)
-   Resume Xanax.  Verified on database -Continue BuSpar

## 2022-02-27 NOTE — Assessment & Plan Note (Signed)
-   Considered reactive in setting of acute infection -Platelets had downtrended prior to discharge

## 2022-02-27 NOTE — Progress Notes (Signed)
Pt admitted to [facility].  Pt A&O X4 and neuro intact.  Pt placed on telemetry and CCMD notified.  CHG bath completed.  Pt oriented to unit and call light within reach.

## 2022-02-27 NOTE — Assessment & Plan Note (Signed)
-   Found to have age-indeterminate DVT involving right proximal profunda vein on 06/21/2018 - Remains on Xarelto chronically -Xarelto resumed at discharge

## 2022-02-27 NOTE — Assessment & Plan Note (Signed)
-   bitten approx 2 weeks ago to left leg and left forearm. Left leg has sutures in place and healing. Left forearm has ongoing wound that has worsened recently. Does not appear to warrant need to treat for tetanus or rabies (she is uptodate on tetanus and dogs have been in quarantine > 10 days with no sxms) - evaluated by hand surgery with rec's for undergoing OR washout; performed on 02/27/22 - s/p meropenem and clindamycin during hospitalization.  Transitioned to Bactrim and Flagyl to complete course at discharge - Outpatient follow-up with Dr. Merlyn Lot

## 2022-02-27 NOTE — Anesthesia Preprocedure Evaluation (Addendum)
Anesthesia Evaluation  Patient identified by MRN, date of birth, ID band Patient awake    Reviewed: Allergy & Precautions, H&P , NPO status , Patient's Chart, lab work & pertinent test results, reviewed documented beta blocker date and time   History of Anesthesia Complications (+) Family history of anesthesia reaction  Airway Mallampati: II  TM Distance: >3 FB Neck ROM: Limited    Dental no notable dental hx. (+) Poor Dentition, Chipped, Missing, Dental Advisory Given,    Pulmonary neg pulmonary ROS, Current Smoker,    Pulmonary exam normal breath sounds clear to auscultation       Cardiovascular Exercise Tolerance: Good hypertension, Pt. on medications and Pt. on home beta blockers Normal cardiovascular exam+ dysrhythmias Atrial Fibrillation  Rhythm:Regular Rate:Normal  1. Left ventricular ejection fraction, by visual estimation, is 60 to 65%. The left ventricle has normal function. Normal left ventricular size. Left ventricular septal wall thickness was normal. Normal left ventricular posterior wall thickness. There is no left ventricular hypertrophy. 2. Global right ventricle has normal systolic function.The right ventricular size is normal. No increase in right ventricular wall thickness. 3. Left atrial size was normal. 4. Right atrial size was normal. 5. Presence of pericardial fat pad. 6. The mitral valve is normal in structure. No evidence of mitral valve regurgitation. No evidence of mitral stenosis. 7. The tricuspid valve is normal in structure. Tricuspid valve regurgitation was not visualized by color flow Doppler. 8. The aortic valve is normal in structure. Aortic valve regurgitation was not visualized by color flow Doppler. Structurally normal aortic valve, with no evidence of sclerosis or stenosis. 9. The pulmonic valve was normal in structure. Pulmonic valve regurgitation is not visualized by color flow  Doppler. 10. TR signal is inadequate   Neuro/Psych PSYCHIATRIC DISORDERS Anxiety Depression Bipolar Disorder Schizophrenia  Neuromuscular disease    GI/Hepatic negative GI ROS, Neg liver ROS,   Endo/Other  negative endocrine ROSdiabetesHypothyroidism   Renal/GU negative Renal ROS  negative genitourinary   Musculoskeletal  (+) Arthritis , Osteoarthritis,  Fibromyalgia -  Abdominal   Peds  Hematology  (+) Blood dyscrasia, anemia ,   Anesthesia Other Findings   Reproductive/Obstetrics negative OB ROS                           Anesthesia Physical Anesthesia Plan  ASA: 3  Anesthesia Plan: General   Post-op Pain Management: Minimal or no pain anticipated   Induction: Intravenous  PONV Risk Score and Plan: 3 and Ondansetron and Dexamethasone  Airway Management Planned: LMA and Oral ETT  Additional Equipment: None  Intra-op Plan:   Post-operative Plan: Extubation in OR  Informed Consent: I have reviewed the patients History and Physical, chart, labs and discussed the procedure including the risks, benefits and alternatives for the proposed anesthesia with the patient or authorized representative who has indicated his/her understanding and acceptance.     Dental Advisory Given  Plan Discussed with: CRNA and Anesthesiologist  Anesthesia Plan Comments: (  HPI: [name] is a 61 y.o. female with medical history significant for hypertension, hypothyroidism, polyneuropathy, chronic diastolic CHF, chronic anxiety/depression, PTSD, schizophrenia, bipolar disorder, who presented to [facility] ED at her PCPs recommendation due to worsening of left forearm and left ankle dog bites wound infection despite outpatient therapy with oral antibiotics.  Associated with left forearm edema, erythema, purulent drainage, and tenderness with palpation.  She originally had these dog bites 2 weeks ago.  She was taking her  prescribed Augmentin, they were improving, and  then suddenly worsened for the past 2 days.)        Anesthesia Quick Evaluation

## 2022-02-27 NOTE — Progress Notes (Signed)
ANTICOAGULATION CONSULT NOTE - Initial Consult  Pharmacy Consult for Xarelto Indication:  h/o VTE  Allergies  Allergen Reactions   Morphine And Related Other (See Comments)    "Didn't agree with me/wasn't very effective.."   Oxycodone    Lyrica [Pregabalin] Rash   Neurontin [Gabapentin] Other (See Comments)    Left patient sleepy all the time    Vital Signs: Temp: 98.4 F (36.9 C) (07/07 0207) Temp Source: Oral (07/07 0207) BP: 176/95 (07/07 0525) Pulse Rate: 72 (07/07 0525)  Labs: Recent Labs    02/26/22 1843 02/27/22 0507  HGB 10.2* 9.4*  HCT 30.4* 27.1*  PLT 602* 550*  CREATININE 0.63 0.68    Estimated Creatinine Clearance: 70.9 mL/min (by C-G formula based on SCr of 0.68 mg/dL).   Medical History: Past Medical History:  Diagnosis Date   Back injury    Chronic   Bipolar disorder (HCC)    Cardiomyopathy (HCC)    hypertension controlled-helped with edema-Dr Nahser   Deafness    Left ear   Degenerative joint disease    with neck surgery   Depression    Diastolic dysfunction    with possible mild LVOT gradient   Dyspnea    Dysrhythmia    per patient, took too much naproxen which caused an "irregular heart beat"   Family history of adverse reaction to anesthesia    " my daughter has PONV and gets very grumpy"   Fibromyalgia    Former cigarette smoker    Gestational diabetes    H/O blood clots    due to side effect of medication   Headache(784.0)    migraines   Hypertension    Hypothyroidism    Leg cramps    Neuropathy    Pain    chronic    Pneumonia    PTSD (post-traumatic stress disorder)    Respiratory disorder    Schizophrenia (HCC)     Assessment: 61yo female admitted with infected dog bite, had stopped her Xarelto 2wk ago to prevent bleeding with dog bite, now to be resumed.   Plan:  Resume Xarelto 20mg  PO daily.  , PharmD, BCPS  02/27/2022,6:39 AM

## 2022-02-27 NOTE — H&P (Addendum)
History and Physical  [name] OBS:[id number] DOB: Nov 04, 1960 DOA: 02/26/2022  Referring physician: Dr. Dolly Rias, [place]  PCP: Shelda Pal, DO  Outpatient Specialists: None Patient coming from: Home  Chief Complaint: Wounds infections   HPI: [name] is a 61 y.o. female with medical history significant for hypertension, hypothyroidism, polyneuropathy, chronic diastolic CHF, chronic anxiety/depression, PTSD, schizophrenia, bipolar disorder, who presented to [facility] ED at her PCPs recommendation due to worsening of left forearm and left ankle dog bites wound infection despite outpatient therapy with oral antibiotics.  Associated with left forearm edema, erythema, purulent drainage, and tenderness with palpation.  She originally had these dog bites 2 weeks ago.  She was taking her prescribed Augmentin, they were improving, and then suddenly worsened for the past 2 days.  In the ED, work-up is notable for cellulitis and left forearm wound infection. She was started on IV antibiotics empirically IV vancomycin.  TRH, hospitalist service, was asked to admit.  ED Course: Tmax 98 4.  BP 182/83, pulse 70, respiratory 18, saturation 100% on room air.  Lab studies remarkable for serum sodium 129, chloride 93.  Albumin 3.4.  Hemoglobin 10.2, MCV 102.4, platelet 602  Review of Systems: Review of systems as noted in the HPI. All other systems reviewed and are negative.   Past Medical History:  Diagnosis Date   Back injury    Chronic   Bipolar disorder ([place])    Cardiomyopathy ([place])    hypertension controlled-helped with edema-Dr Nahser   Deafness    Left ear   Degenerative joint disease    with neck surgery   Depression    Diastolic dysfunction    with possible mild LVOT gradient   Dyspnea    Dysrhythmia    per patient, took too much naproxen which caused an "irregular heart beat"   Family history of adverse reaction to anesthesia    " my daughter has PONV and gets very  grumpy"   Fibromyalgia    Former cigarette smoker    Gestational diabetes    H/O blood clots    due to side effect of medication   Headache(784.0)    migraines   Hypertension    Hypothyroidism    Leg cramps    Neuropathy    Pain    chronic    Pneumonia    PTSD (post-traumatic stress disorder)    Respiratory disorder    Schizophrenia ([place])    Past Surgical History:  Procedure Laterality Date   ABDOMINAL HYSTERECTOMY     partial   ANTERIOR CERVICAL DECOMP/DISCECTOMY FUSION N/A 01/30/2020   Procedure: Cervical Three-FourAnterior cervical decompression/discectomy/fusion with Hardware Removal.;  Surgeon: Kristeen Miss, MD;  Location: [place];  Service: Neurosurgery;  Laterality: N/A;  anterior   APPENDECTOMY     CARDIAC CATHETERIZATION     Ejection Fraction 65-70%   CERVICAL DISC ARTHROPLASTY N/A 02/21/2016   Procedure: Cervical three-four artificial disc replacement;  Surgeon: Kristeen Miss, MD;  Location: [place] [facility];  Service: Neurosurgery;  Laterality: N/A;   CERVICAL FUSION     C5-6   EXTERNAL EAR SURGERY     tumor at age 72, 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   HERNIA REPAIR     " as a baby"   NECK SURGERY     OTHER SURGICAL HISTORY     History of cervical and lumbar disk surgery   PERCUTANEOUS PINNING Left 12/01/2019   Procedure: Closed reduction percutaneous pinning left ring finger proximal phalanx fracture;  Surgeon: Claudia Desanctis,  Steffanie Dunn, MD;  Location: [place];  Service: Plastics;  Laterality: Left;   polypectomies     tubes   POSTERIOR CERVICAL LAMINECTOMY WITH MET- RX Left 06/13/2018   Procedure: Left Cervical three-four Laminotomy/foraminotomy;  Surgeon: Kristeen Miss, MD;  Location: [place];  Service: Neurosurgery;  Laterality: Left;   TONSILLECTOMY     TONSILLECTOMY AND ADENOIDECTOMY      Social History:  reports that she has been smoking cigarettes. She has a 12.50 pack-year smoking history. She has never used smokeless tobacco. She reports current alcohol use. She reports that she  does not use drugs.   Allergies  Allergen Reactions   Morphine And Related Other (See Comments)    "Didn't agree with me/wasn't very effective.."   Oxycodone    Lyrica [Pregabalin] Rash   Neurontin [Gabapentin] Other (See Comments)    Left patient sleepy all the time    Family History  Problem Relation Age of Onset   Coronary artery disease Father    Parkinson's disease Mother    Cancer Sister        breast   Diabetes Sister    Diabetes Brother       Prior to Admission medications   Medication Sig Start Date End Date Taking? Authorizing Provider  acetaminophen (TYLENOL) 500 MG tablet Take 1,000 mg by mouth every 6 (six) hours as needed for mild pain or headache.     [provider]  ALPRAZolam Duanne Moron) 1 MG tablet TAKE 1 TABLET BY MOUTH TWICE A DAY AS NEEDED FOR ANXIETY 02/13/22   Mozingo, Berdie Ogren, NP  amoxicillin-clavulanate (AUGMENTIN) 875-125 MG tablet Take 1 tablet by mouth every 12 (twelve) hours. 02/18/22   Rosemarie Ax, MD  bisacodyl (DULCOLAX) 5 MG EC tablet Take 5 mg by mouth 2 (two) times daily as needed for moderate constipation.     [provider]  busPIRone (BUSPAR) 15 MG tablet Take 1 tablet (15 mg total) by mouth 3 (three) times daily. 10/28/21   Mozingo, Berdie Ogren, NP  ciprofloxacin (CILOXAN) 0.3 % ophthalmic solution 4 drops 2 (two) times daily. 01/05/22   [provider]  citalopram (CELEXA) 20 MG tablet Take 1 tablet (20 mg total) by mouth daily. 10/28/21   Mozingo, Berdie Ogren, NP  dexamethasone (DECADRON) 0.1 % ophthalmic solution 2 drops 2 (two) times daily. 01/06/22   [provider]  hydrochlorothiazide (HYDRODIURIL) 25 MG tablet Take 1 tablet (25 mg total) by mouth daily. APPOINTMENT NEEDED FOR FURTHER REFILLS CALL [phone] 07/03/20   Nahser, Wonda Cheng, MD  HYDROcodone-acetaminophen (NORCO/VICODIN) 5-325 MG tablet Take 1 tablet by mouth every 6 (six) hours as needed. 02/16/22   Rosemarie Ax, MD   hydrOXYzine (ATARAX/VISTARIL) 25 MG tablet TAKE 3 TO 4 TABLETS BY MOUTH EVERY 8 HOURS AS NEEDED FOR ANXIETY. MAX AMOUNT PER INSURANCE Patient taking differently: Take 75-100 mg by mouth every 8 (eight) hours as needed for anxiety. TAKE 3 TO 4 TABLETS BY MOUTH EVERY 8 HOURS AS NEEDED FOR ANXIETY. MAX AMOUNT PER INSURANCE 05/02/19   Shelda Pal, DO  levothyroxine (SYNTHROID) 50 MCG tablet TAKE 1 TABLET BY MOUTH EVERY DAY Patient taking differently: Take 50 mcg by mouth daily. 02/14/19   Shelda Pal, DO  lidocaine (LIDODERM) 5 % Place 3 patches onto the skin daily. Remove & Discard patch within 12 hours or as directed by MD 04/26/20   Courtney Heys, MD  loratadine (CLARITIN) 10 MG tablet Take 10 mg by mouth 2 (two) times  daily as needed (for seasonal allergies).     [provider]  meclizine (ANTIVERT) 25 MG tablet Take 1 tablet (25 mg total) by mouth 3 (three) times daily as needed for dizziness. 10/07/18   Shelda Pal, DO  metoprolol tartrate (LOPRESSOR) 25 MG tablet Take 1 tablet (25 mg total) by mouth 2 (two) times daily. 02/13/22   Nahser, Wonda Cheng, MD  naproxen (NAPROSYN) 500 MG tablet TAKE 1 TABLET DAILY AS NEEDED FOR PAIN. USE SPARINGLY. 02/02/22   Shelda Pal, DO  orphenadrine (NORFLEX) 100 MG tablet Take 1 tablet (100 mg total) by mouth 2 (two) times daily as needed for muscle spasms. 11/03/21   Lovorn, Jinny Blossom, MD  risperiDONE (RISPERDAL) 1 MG tablet TAKE 1 TABLET BY MOUTH EVERYDAY AT BEDTIME 10/28/21   Mozingo, Berdie Ogren, NP  rivaroxaban (XARELTO) 20 MG TABS tablet Take 1 tablet (20 mg total) by mouth daily with supper. 12/05/21   Wendling, Crosby Oyster, DO  tiZANidine (ZANAFLEX) 4 MG tablet TAKE 1 & 1/2 TO 2 (ONE & ONE-HALF TO TWO) TABLETS BY MOUTH THREE TIMES DAILY 12/29/21   Lovorn, Jinny Blossom, MD    Physical Exam: BP (!) 183/83 (BP Location: Right Arm)   Pulse 70   Temp 98.4 F (36.9 C) (Oral)   Resp 18   SpO2 100%   General: 61 y.o.  year-old female well developed well nourished in no acute distress.  Alert and oriented x3. Cardiovascular: Regular rate and rhythm with no rubs or gallops.  No thyromegaly or JVD noted.  No lower extremity edema. 2/4 pulses in all 4 extremities. Respiratory: Clear to auscultation with no wheezes or rales. Good inspiratory effort. Abdomen: Soft nontender nondistended with normal bowel sounds x4 quadrants. Muskuloskeletal: No cyanosis, clubbing or edema noted bilaterally Neuro: CN II-XII intact, strength, sensation, reflexes Skin:    Left forearm. Psychiatry: Judgement and insight appear normal. Mood is appropriate for condition and setting          Labs on Admission:  Basic Metabolic Panel: Recent Labs  Lab 02/26/22 1843  NA 129*  K 3.6  CL 93*  CO2 22  GLUCOSE 83  BUN 6  CREATININE 0.63  CALCIUM 9.0   Liver Function Tests: Recent Labs  Lab 02/26/22 1843  AST 15  ALT 15  ALKPHOS 76  BILITOT 0.3  PROT 7.0  ALBUMIN 3.4*   No results for input(s): "LIPASE", "AMYLASE" in the last 168 hours. No results for input(s): "AMMONIA" in the last 168 hours. CBC: Recent Labs  Lab 02/26/22 1843  WBC 9.0  NEUTROABS 5.2  HGB 10.2*  HCT 30.4*  MCV 102.4*  PLT 602*   Cardiac Enzymes: No results for input(s): "CKTOTAL", "CKMB", "CKMBINDEX", "TROPONINI" in the last 168 hours.  BNP (last 3 results) No results for input(s): "BNP" in the last 8760 hours.  ProBNP (last 3 results) No results for input(s): "PROBNP" in the last 8760 hours.  CBG: No results for input(s): "GLUCAP" in the last 168 hours.  Radiological Exams on Admission: CT TIBIA FIBULA LEFT W CONTRAST  Result Date: 02/27/2022 CLINICAL DATA:  Dog bite EXAM: CT OF THE LOWER LEFT EXTREMITY WITH CONTRAST TECHNIQUE: Multidetector CT imaging of the lower left extremity was performed according to the standard protocol following intravenous contrast administration. RADIATION DOSE REDUCTION: This exam was performed  according to the departmental dose-optimization program which includes automated exposure control, adjustment of the mA and/or kV according to patient size and/or use of iterative reconstruction technique. CONTRAST:  32m  OMNIPAQUE IOHEXOL 300 MG/ML  SOLN COMPARISON:  None Available. FINDINGS: Bones/Joint/Cartilage Normal Ligaments Suboptimally assessed by CT. Muscles and Tendons Unremarkable Soft tissues No fluid collection or tracking soft tissue gas. IMPRESSION: No specific findings of infection in the left leg. Electronically Signed   By: Ulyses Jarred M.D.   On: 02/27/2022 03:23   CT FOREARM LEFT W CONTRAST  Result Date: 02/27/2022 CLINICAL DATA:  Dog bites EXAM: CT OF THE UPPER LEFT EXTREMITY WITH CONTRAST TECHNIQUE: Multidetector CT imaging of the upper left extremity was performed according to the standard protocol following intravenous contrast administration. RADIATION DOSE REDUCTION: This exam was performed according to the departmental dose-optimization program which includes automated exposure control, adjustment of the mA and/or kV according to patient size and/or use of iterative reconstruction technique. CONTRAST:  77m OMNIPAQUE IOHEXOL 300 MG/ML  SOLN COMPARISON:  None Available. FINDINGS: Bones/Joint/Cartilage Normal Ligaments Suboptimally assessed by CT. Muscles and Tendons Unremarkable Soft tissues There is a large soft tissue wound superficial to the left radius. There is no fluid collection. There is a small amount of subcutaneous gas but no gas tracking along fascial planes. IMPRESSION: Large soft tissue wound superficial to the left radius without fluid collection. Electronically Signed   By: KUlyses JarredM.D.   On: 02/27/2022 03:21   DG Forearm Right  Result Date: 02/26/2022 CLINICAL DATA:  Recent dog bite with local infection, initial encounter EXAM: RIGHT FOREARM - 2 VIEW COMPARISON:  None Available. FINDINGS: No acute fracture or dislocation is noted. No focal soft tissue  abnormality is noted to correspond with the given clinical history. IMPRESSION: No acute bony abnormality noted. Electronically Signed   By: MInez CatalinaM.D.   On: 02/26/2022 19:29   DG Foot Complete Right  Result Date: 02/26/2022 CLINICAL DATA:  infection.  Dog bite EXAM: RIGHT FOOT COMPLETE - 3+ VIEW COMPARISON:  None Available. FINDINGS: No cortical erosion or destruction. There is no evidence of fracture or dislocation. There is no evidence of arthropathy or other focal bone abnormality. Plantar calcaneal spur. Dorsal forefoot/midfoot subcutaneus soft tissue edema. No retained radiopaque foreign body. IMPRESSION: 1. No radiographic findings to  suggest osteomyelitis. 2.  No acute displaced fracture or dislocation. 3. No retained radiopaque foreign body. Electronically Signed   By: MIven FinnM.D.   On: 02/26/2022 19:27    EKG: I independently viewed the EKG done and my findings are as followed: None available at the time of the visit.  Assessment/Plan Present on Admission:  Wound infection  Principal Problem:   Wound infection  Dog bite wounds infections, POA She originally had these dog bites 2 weeks ago.  She was taking her prescribed Augmentin, they were improving, and then suddenly worsened for the past 2 days. Obtain blood cultures Started on IV vancomycin in the ED, continue Added Cefepime to broaden coverage then de-escalate when appropriate. Pain control and bowel regimen Consult hand surgery in the morning for left forearm wound infection Monitor fever curve and WBC  Hypovolemic hyponatremia, likely contributed by home HCTZ Continue to hold HCTZ Gentle IV hydration normal saline Repeat chemistry panel in the morning  Thrombocytosis in the setting of active infective process, likely reactive Platelet count 602K Strict underlying conditions Gentle IV fluid hydration Monitor for now  Macrocytic anemia Hemoglobin 10.2, MCV 102.4 Monitor  Moderate protein calorie  malnutrition Albumin 3.4 BUN 23 Encourage oral intake as tolerated  Hypothyroidism Resume home levothyroxine  History of DVT on Xarelto She had held Xarelto for 2 weeks  Resume home Xarelto, pharmacy consulted for dosing  Chronic anxiety/depression/bipolar disorder Stable Resume home regimen    DVT prophylaxis: Xarelto  Code Status: Full code  Family Communication: Husband at bedside  Disposition Plan: Admitted to telemetry surgical unit  Consults called: Consult orthopedic/hand surgery in the morning for left forearm wound infection.  Admission status: Inpatient status.   Status is: Inpatient The patient will require at least 2 midnights for further evaluation and treatment of current condition.   Kayleen Memos MD [facility] Pager [phone]  If 7PM-7AM, please contact night-coverage [email or web address] Password [facility]  02/27/2022, 3:53 AM

## 2022-02-27 NOTE — Progress Notes (Signed)
Progress Note    [name]   DGL:[id number]  DOB: January 25, 1961  DOA: 02/26/2022     0 PCP: Sharlene Dory, DO  Initial CC: dog bite  Hospital Course: Ms. Mcclellan is a 61 yo female with PMH depression, neuropathy, bipolar disorder, hard of hearing, fibromyalgia, HTN, hypothyroidism, PTSD who presented to the ER after a nonresolving dog bite to her left forearm. History and collateral information provided by the patient and her husband.  They state that the dogs were taken into quarantine approximately 2 weeks ago after the bite.  She was also on outpatient Augmentin but states that the wound on her left forearm has worsened the last couple days. She endorsed that she is up to date on current tetanus shot with last shot within the last 2 to 3 years.  They also state they have not heard anything from quarantine about the dogs so it is assumed they are asymptomatic.  She was seen initially for the dog bites 2 weeks ago when she was started on antibiotics as well. CT left leg showed no acute findings concerning for infection as she was also bitten in the leg.  CT left forearm showed a large soft tissue wound with subcutaneous gas. She was started on meropenem and clindamycin on admission. Hand surgery was also consulted and she was recommended for undergoing washout in the OR.  Interval History:  Seen in the ER hallway this morning.  Husband present next to stretcher.  She has been evaluated by surgery and plan is for debridement this afternoon.  She is still having ongoing pain in her left forearm due to worsening of the wound.  Assessment and Plan: * Dog bite - bitten approx 2 weeks ago to left leg and left forearm. Left leg has sutures in place and healing. Left forearm has ongoing wound that has worsened recently. Does not appear to warrant need to treat for tetanus or rabies (she is uptodate on tetanus and dogs have been in quarantine > 10 days with no sxms) - evaluated by hand  surgery with rec's for undergoing OR washout - continue meropenem and clinda; follow up any cultures   History of DVT (deep vein thrombosis) - Found to have age-indeterminate DVT involving right proximal profunda vein on 06/21/2018 - Remains on Xarelto chronically -On hold for now in setting of surgery  Bipolar disorder (H[facility]) - Continue Risperdal  Hypothyroidism - Continue Synthroid  Macrocytic anemia - check folate and B12  Thrombocytosis - Considered reactive in setting of acute infection - Continue trending CBC  Hyponatremia - HCTZ on hold - Continue IV fluids - Repeat BMP in a.m.  GAD (generalized anxiety disorder) - Resume Xanax.  Verified on database -Continue BuSpar  Hypertension - Hold HCTZ and metoprolol for now - Use labetalol or hydralazine as needed   Old records reviewed in assessment of this patient  Antimicrobials: Vanc 7/7 x 1 Meropenem 7/7 >> current Clinda 7/7 >> current  DVT prophylaxis:  Lovenox postop   Code Status:   Code Status: Full Code  Mobility Assessment (last 72 hours)     Mobility Assessment   No documentation.           Disposition Plan:  Home 1-2 days Status is: Inpt  Objective: Blood pressure (!) 185/71, pulse 84, temperature 99 F (37.2 C), temperature source Oral, resp. rate 20, height 5\' 6"  (1.676 m), weight 67.1 kg, SpO2 97 %.  Examination:  Physical Exam Constitutional:  Appearance: Normal appearance.  HENT:     Head: Normocephalic and atraumatic.     Mouth/Throat:     Mouth: Mucous membranes are moist.  Cardiovascular:     Rate and Rhythm: Normal rate and regular rhythm.  Pulmonary:     Effort: Pulmonary effort is normal.     Breath sounds: Normal breath sounds.  Abdominal:     General: Bowel sounds are normal. There is no distension.     Palpations: Abdomen is soft.  Musculoskeletal:     Cervical back: Normal range of motion and neck supple.     Comments: L forearm noted in dressing, not  taken down; no erythema or edema noted prox to bandage. LLE noted with sutures in distal leg with good healing of skin   Skin:    General: Skin is warm.  Neurological:     General: No focal deficit present.     Mental Status: She is alert.  Psychiatric:        Mood and Affect: Mood normal.        Behavior: Behavior normal.      Consultants:  Hand surgery  Procedures:    Data Reviewed: Results for orders placed or performed during the hospital encounter of 02/26/22 (from the past 24 hour(s))  Comprehensive metabolic panel     Status: Abnormal   Collection Time: 02/26/22  6:43 PM  Result Value Ref Range   Sodium 129 (L) 135 - 145 mmol/L   Potassium 3.6 3.5 - 5.1 mmol/L   Chloride 93 (L) 98 - 111 mmol/L   CO2 22 22 - 32 mmol/L   Glucose, Bld 83 70 - 99 mg/dL   BUN 6 6 - 20 mg/dL   Creatinine, Ser 1.61 0.44 - 1.00 mg/dL   Calcium 9.0 8.9 - 09.6 mg/dL   Total Protein 7.0 6.5 - 8.1 g/dL   Albumin 3.4 (L) 3.5 - 5.0 g/dL   AST 15 15 - 41 U/L   ALT 15 0 - 44 U/L   Alkaline Phosphatase 76 38 - 126 U/L   Total Bilirubin 0.3 0.3 - 1.2 mg/dL   GFR, Estimated >04 >54 mL/min   Anion gap 14 5 - 15  CBC with Differential     Status: Abnormal   Collection Time: 02/26/22  6:43 PM  Result Value Ref Range   WBC 9.0 4.0 - 10.5 K/uL   RBC 2.97 (L) 3.87 - 5.11 MIL/uL   Hemoglobin 10.2 (L) 12.0 - 15.0 g/dL   HCT 09.8 (L) 11.9 - 14.7 %   MCV 102.4 (H) 80.0 - 100.0 fL   MCH 34.3 (H) 26.0 - 34.0 pg   MCHC 33.6 30.0 - 36.0 g/dL   RDW 82.9 56.2 - 13.0 %   Platelets 602 (H) 150 - 400 K/uL   nRBC 0.0 0.0 - 0.2 %   Neutrophils Relative % 58 %   Neutro Abs 5.2 1.7 - 7.7 K/uL   Lymphocytes Relative 32 %   Lymphs Abs 2.9 0.7 - 4.0 K/uL   Monocytes Relative 7 %   Monocytes Absolute 0.7 0.1 - 1.0 K/uL   Eosinophils Relative 1 %   Eosinophils Absolute 0.1 0.0 - 0.5 K/uL   Basophils Relative 2 %   Basophils Absolute 0.1 0.0 - 0.1 K/uL   Immature Granulocytes 0 %   Abs Immature Granulocytes  0.04 0.00 - 0.07 K/uL  Blood culture (routine x 2)     Status: None (Preliminary result)   Collection Time: 02/27/22  1:27 AM   Specimen: BLOOD  Result Value Ref Range   Specimen Description BLOOD LEFT ANTECUBITAL    Special Requests      BOTTLES DRAWN AEROBIC AND ANAEROBIC Blood Culture adequate volume   Culture      NO GROWTH < 12 HOURS Performed at [facility], [address], [place], [place] [address]    Report Status PENDING   Blood culture (routine x 2)     Status: None (Preliminary result)   Collection Time: 02/27/22  2:56 AM   Specimen: BLOOD RIGHT HAND  Result Value Ref Range   Specimen Description BLOOD RIGHT HAND    Special Requests AEROBIC BOTTLE ONLY Blood Culture adequate volume    Culture      NO GROWTH < 12 HOURS Performed at [facility], [address] [place], [place] [address]    Report Status PENDING   CBC with Differential/Platelet     Status: Abnormal   Collection Time: 02/27/22  5:07 AM  Result Value Ref Range   WBC 8.1 4.0 - 10.5 K/uL   RBC 2.75 (L) 3.87 - 5.11 MIL/uL   Hemoglobin 9.4 (L) 12.0 - 15.0 g/dL   HCT 70.6 (L) 23.7 - 62.8 %   MCV 98.5 80.0 - 100.0 fL   MCH 34.2 (H) 26.0 - 34.0 pg   MCHC 34.7 30.0 - 36.0 g/dL   RDW 31.5 17.6 - 16.0 %   Platelets 550 (H) 150 - 400 K/uL   nRBC 0.0 0.0 - 0.2 %   Neutrophils Relative % 70 %   Neutro Abs 5.7 1.7 - 7.7 K/uL   Lymphocytes Relative 19 %   Lymphs Abs 1.6 0.7 - 4.0 K/uL   Monocytes Relative 8 %   Monocytes Absolute 0.7 0.1 - 1.0 K/uL   Eosinophils Relative 1 %   Eosinophils Absolute 0.1 0.0 - 0.5 K/uL   Basophils Relative 2 %   Basophils Absolute 0.1 0.0 - 0.1 K/uL   Immature Granulocytes 0 %   Abs Immature Granulocytes 0.03 0.00 - 0.07 K/uL  Comprehensive metabolic panel     Status: Abnormal   Collection Time: 02/27/22  5:07 AM  Result Value Ref Range   Sodium 133 (L) 135 - 145 mmol/L   Potassium 3.8 3.5 - 5.1 mmol/L   Chloride 97 (L) 98 - 111 mmol/L   CO2 23 22 - 32  mmol/L   Glucose, Bld 117 (H) 70 - 99 mg/dL   BUN 6 6 - 20 mg/dL   Creatinine, Ser 7.37 0.44 - 1.00 mg/dL   Calcium 8.7 (L) 8.9 - 10.3 mg/dL   Total Protein 6.0 (L) 6.5 - 8.1 g/dL   Albumin 3.0 (L) 3.5 - 5.0 g/dL   AST 12 (L) 15 - 41 U/L   ALT 14 0 - 44 U/L   Alkaline Phosphatase 68 38 - 126 U/L   Total Bilirubin 0.6 0.3 - 1.2 mg/dL   GFR, Estimated >10 >62 mL/min   Anion gap 13 5 - 15  Magnesium     Status: None   Collection Time: 02/27/22  5:07 AM  Result Value Ref Range   Magnesium 2.0 1.7 - 2.4 mg/dL  Phosphorus     Status: None   Collection Time: 02/27/22  5:07 AM  Result Value Ref Range   Phosphorus 3.4 2.5 - 4.6 mg/dL    I have Reviewed nursing notes, Vitals, and Lab results since pt's last encounter. Pertinent lab results : see above I  have ordered test including BMP, CBC, Mg I have reviewed the last note from staff over past 24 hours I have discussed pt's care plan and test results with nursing staff, case manager   LOS: 0 days   Lewie Chamber, MD [facility] 02/27/2022, 3:10 PM

## 2022-02-27 NOTE — Assessment & Plan Note (Signed)
Continue Synthroid °

## 2022-02-27 NOTE — ED Notes (Signed)
Pt is dressed in a gown and belongings with husband. Consent signed and placed at desk

## 2022-02-27 NOTE — Consult Note (Addendum)
Reason for Consult:Left FA wound Referring Physician: Dwyane Dee Time called: 1173 Time at bedside: [place] is an 61 y.o. female.  HPI: [name] slipped and fell in between her 2 dogs who were fighting 2 weeks ago. They turned their attention to her and she was bit several times. In addition to other injuries she had a left forearm wound. She was sutured and given oral antibiotics. She seemed to be doing well from these but about 3d ago worsened again. She was evaluated in ED yesterday and admitted with wound infection despite abx treatment. She is RHD.  Past Medical History:  Diagnosis Date   Back injury    Chronic   Bipolar disorder ([place])    Cardiomyopathy ([place])    hypertension controlled-helped with edema-Dr Nahser   Deafness    Left ear   Degenerative joint disease    with neck surgery   Depression    Diastolic dysfunction    with possible mild LVOT gradient   Dyspnea    Dysrhythmia    per patient, took too much naproxen which caused an "irregular heart beat"   Family history of adverse reaction to anesthesia    " my daughter has PONV and gets very grumpy"   Fibromyalgia    Former cigarette smoker    Gestational diabetes    H/O blood clots    due to side effect of medication   Headache(784.0)    migraines   Hypertension    Hypothyroidism    Leg cramps    Neuropathy    Pain    chronic    Pneumonia    PTSD (post-traumatic stress disorder)    Respiratory disorder    Schizophrenia ([place])     Past Surgical History:  Procedure Laterality Date   ABDOMINAL HYSTERECTOMY     partial   ANTERIOR CERVICAL DECOMP/DISCECTOMY FUSION N/A 01/30/2020   Procedure: Cervical Three-FourAnterior cervical decompression/discectomy/fusion with Hardware Removal.;  Surgeon: Kristeen Miss, MD;  Location: [facility];  Service: Neurosurgery;  Laterality: N/A;  anterior   APPENDECTOMY     CARDIAC CATHETERIZATION     Ejection Fraction 65-70%   CERVICAL DISC ARTHROPLASTY N/A  02/21/2016   Procedure: Cervical three-four artificial disc replacement;  Surgeon: Kristeen Miss, MD;  Location: [facility];  Service: Neurosurgery;  Laterality: N/A;   CERVICAL FUSION     C5-6   EXTERNAL EAR SURGERY     tumor at age 68, 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   HERNIA REPAIR     " as a baby"   NECK SURGERY     OTHER SURGICAL HISTORY     History of cervical and lumbar disk surgery   PERCUTANEOUS PINNING Left 12/01/2019   Procedure: Closed reduction percutaneous pinning left ring finger proximal phalanx fracture;  Surgeon: Cindra Presume, MD;  Location: [facility];  Service: Plastics;  Laterality: Left;   polypectomies     tubes   POSTERIOR CERVICAL LAMINECTOMY WITH MET- RX Left 06/13/2018   Procedure: Left Cervical three-four Laminotomy/foraminotomy;  Surgeon: Kristeen Miss, MD;  Location: [facility];  Service: Neurosurgery;  Laterality: Left;   TONSILLECTOMY     TONSILLECTOMY AND ADENOIDECTOMY      Family History  Problem Relation Age of Onset   Coronary artery disease Father    Parkinson's disease Mother    Cancer Sister        breast   Diabetes Sister    Diabetes Brother     Social History:  reports that  she has been smoking cigarettes. She has a 12.50 pack-year smoking history. She has never used smokeless tobacco. She reports current alcohol use. She reports that she does not use drugs.  Allergies:  Allergies  Allergen Reactions   Morphine And Related Other (See Comments)    "Didn't agree with me/wasn't very effective.."   Oxycodone    Lyrica [Pregabalin] Rash   Neurontin [Gabapentin] Other (See Comments)    Left patient sleepy all the time    Medications: I have reviewed the patient's current medications.  Results for orders placed or performed during the hospital encounter of 02/26/22 (from the past 48 hour(s))  Comprehensive metabolic panel     Status: Abnormal   Collection Time: 02/26/22  6:43 PM  Result Value Ref Range   Sodium 129 (L) 135 - 145 mmol/L   Potassium 3.6  3.5 - 5.1 mmol/L   Chloride 93 (L) 98 - 111 mmol/L   CO2 22 22 - 32 mmol/L   Glucose, Bld 83 70 - 99 mg/dL    Comment: Glucose reference range applies only to samples taken after fasting for at least 8 hours.   BUN 6 6 - 20 mg/dL   Creatinine, Ser 0.63 0.44 - 1.00 mg/dL   Calcium 9.0 8.9 - 10.3 mg/dL   Total Protein 7.0 6.5 - 8.1 g/dL   Albumin 3.4 (L) 3.5 - 5.0 g/dL   AST 15 15 - 41 U/L   ALT 15 0 - 44 U/L   Alkaline Phosphatase 76 38 - 126 U/L   Total Bilirubin 0.3 0.3 - 1.2 mg/dL   GFR, Estimated >60 >60 mL/min    Comment: (NOTE) Calculated using the CKD-EPI Creatinine Equation (2021)    Anion gap 14 5 - 15    Comment: Performed at [facility] [address], [place], [place] [address]  CBC with Differential     Status: Abnormal   Collection Time: 02/26/22  6:43 PM  Result Value Ref Range   WBC 9.0 4.0 - 10.5 K/uL   RBC 2.97 (L) 3.87 - 5.11 MIL/uL   Hemoglobin 10.2 (L) 12.0 - 15.0 g/dL   HCT 30.4 (L) 36.0 - 46.0 %   MCV 102.4 (H) 80.0 - 100.0 fL   MCH 34.3 (H) 26.0 - 34.0 pg   MCHC 33.6 30.0 - 36.0 g/dL   RDW 13.6 11.5 - 15.5 %   Platelets 602 (H) 150 - 400 K/uL   nRBC 0.0 0.0 - 0.2 %   Neutrophils Relative % 58 %   Neutro Abs 5.2 1.7 - 7.7 K/uL   Lymphocytes Relative 32 %   Lymphs Abs 2.9 0.7 - 4.0 K/uL   Monocytes Relative 7 %   Monocytes Absolute 0.7 0.1 - 1.0 K/uL   Eosinophils Relative 1 %   Eosinophils Absolute 0.1 0.0 - 0.5 K/uL   Basophils Relative 2 %   Basophils Absolute 0.1 0.0 - 0.1 K/uL   Immature Granulocytes 0 %   Abs Immature Granulocytes 0.04 0.00 - 0.07 K/uL    Comment: Performed at [facility], [place] [address], [place], [place] [address]  CBC with Differential/Platelet     Status: Abnormal   Collection Time: 02/27/22  5:07 AM  Result Value Ref Range   WBC 8.1 4.0 - 10.5 K/uL   RBC 2.75 (L) 3.87 - 5.11 MIL/uL   Hemoglobin 9.4 (L) 12.0 - 15.0 g/dL   HCT 27.1 (L) 36.0 - 46.0 %   MCV 98.5 80.0 - 100.0  fL   MCH 34.2 (H) 26.0 - 34.0  pg   MCHC 34.7 30.0 - 36.0 g/dL   RDW 13.6 11.5 - 15.5 %   Platelets 550 (H) 150 - 400 K/uL   nRBC 0.0 0.0 - 0.2 %   Neutrophils Relative % 70 %   Neutro Abs 5.7 1.7 - 7.7 K/uL   Lymphocytes Relative 19 %   Lymphs Abs 1.6 0.7 - 4.0 K/uL   Monocytes Relative 8 %   Monocytes Absolute 0.7 0.1 - 1.0 K/uL   Eosinophils Relative 1 %   Eosinophils Absolute 0.1 0.0 - 0.5 K/uL   Basophils Relative 2 %   Basophils Absolute 0.1 0.0 - 0.1 K/uL   Immature Granulocytes 0 %   Abs Immature Granulocytes 0.03 0.00 - 0.07 K/uL    Comment: Performed at [facility] [address], [place], [place] [address]  Comprehensive metabolic panel     Status: Abnormal   Collection Time: 02/27/22  5:07 AM  Result Value Ref Range   Sodium 133 (L) 135 - 145 mmol/L   Potassium 3.8 3.5 - 5.1 mmol/L   Chloride 97 (L) 98 - 111 mmol/L   CO2 23 22 - 32 mmol/L   Glucose, Bld 117 (H) 70 - 99 mg/dL    Comment: Glucose reference range applies only to samples taken after fasting for at least 8 hours.   BUN 6 6 - 20 mg/dL   Creatinine, Ser 0.68 0.44 - 1.00 mg/dL   Calcium 8.7 (L) 8.9 - 10.3 mg/dL   Total Protein 6.0 (L) 6.5 - 8.1 g/dL   Albumin 3.0 (L) 3.5 - 5.0 g/dL   AST 12 (L) 15 - 41 U/L   ALT 14 0 - 44 U/L   Alkaline Phosphatase 68 38 - 126 U/L   Total Bilirubin 0.6 0.3 - 1.2 mg/dL   GFR, Estimated >60 >60 mL/min    Comment: (NOTE) Calculated using the CKD-EPI Creatinine Equation (2021)    Anion gap 13 5 - 15    Comment: Performed at [facility], [place] [address], [place], [place] [address]  Magnesium     Status: None   Collection Time: 02/27/22  5:07 AM  Result Value Ref Range   Magnesium 2.0 1.7 - 2.4 mg/dL    Comment: Performed at [facility] [address], [place], [place] [address]  Phosphorus     Status: None   Collection Time: 02/27/22  5:07 AM  Result Value Ref Range   Phosphorus 3.4 2.5 - 4.6 mg/dL    Comment: Performed at [facility] [address], [place],  [place] [address]    CT TIBIA FIBULA LEFT W CONTRAST  Result Date: 02/27/2022 CLINICAL DATA:  Dog bite EXAM: CT OF THE LOWER LEFT EXTREMITY WITH CONTRAST TECHNIQUE: Multidetector CT imaging of the lower left extremity was performed according to the standard protocol following intravenous contrast administration. RADIATION DOSE REDUCTION: This exam was performed according to the departmental dose-optimization program which includes automated exposure control, adjustment of the mA and/or kV according to patient size and/or use of iterative reconstruction technique. CONTRAST:  27m OMNIPAQUE IOHEXOL 300 MG/ML  SOLN COMPARISON:  None Available. FINDINGS: Bones/Joint/Cartilage Normal Ligaments Suboptimally assessed by CT. Muscles and Tendons Unremarkable Soft tissues No fluid collection or tracking soft tissue gas. IMPRESSION: No specific findings of infection in the left leg. Electronically Signed   By: KUlyses JarredM.D.   On: 02/27/2022 03:23   CT  FOREARM LEFT W CONTRAST  Result Date: 02/27/2022 CLINICAL DATA:  Dog bites EXAM: CT OF THE UPPER LEFT EXTREMITY WITH CONTRAST TECHNIQUE: Multidetector CT imaging of the upper left extremity was performed according to the standard protocol following intravenous contrast administration. RADIATION DOSE REDUCTION: This exam was performed according to the departmental dose-optimization program which includes automated exposure control, adjustment of the mA and/or kV according to patient size and/or use of iterative reconstruction technique. CONTRAST:  20m OMNIPAQUE IOHEXOL 300 MG/ML  SOLN COMPARISON:  None Available. FINDINGS: Bones/Joint/Cartilage Normal Ligaments Suboptimally assessed by CT. Muscles and Tendons Unremarkable Soft tissues There is a large soft tissue wound superficial to the left radius. There is no fluid collection. There is a small amount of subcutaneous gas but no gas tracking along fascial planes. IMPRESSION: Large soft tissue wound superficial to the left  radius without fluid collection. Electronically Signed   By: KUlyses JarredM.D.   On: 02/27/2022 03:21   DG Forearm Right  Result Date: 02/26/2022 CLINICAL DATA:  Recent dog bite with local infection, initial encounter EXAM: RIGHT FOREARM - 2 VIEW COMPARISON:  None Available. FINDINGS: No acute fracture or dislocation is noted. No focal soft tissue abnormality is noted to correspond with the given clinical history. IMPRESSION: No acute bony abnormality noted. Electronically Signed   By: MInez CatalinaM.D.   On: 02/26/2022 19:29   DG Foot Complete Right  Result Date: 02/26/2022 CLINICAL DATA:  infection.  Dog bite EXAM: RIGHT FOOT COMPLETE - 3+ VIEW COMPARISON:  None Available. FINDINGS: No cortical erosion or destruction. There is no evidence of fracture or dislocation. There is no evidence of arthropathy or other focal bone abnormality. Plantar calcaneal spur. Dorsal forefoot/midfoot subcutaneus soft tissue edema. No retained radiopaque foreign body. IMPRESSION: 1. No radiographic findings to  suggest osteomyelitis. 2.  No acute displaced fracture or dislocation. 3. No retained radiopaque foreign body. Electronically Signed   By: MIven FinnM.D.   On: 02/26/2022 19:27    Review of Systems  HENT:  Negative for ear discharge, ear pain, hearing loss and tinnitus.   Eyes:  Negative for photophobia and pain.  Respiratory:  Negative for cough and shortness of breath.   Cardiovascular:  Positive for chest pain.  Gastrointestinal:  Negative for abdominal pain, nausea and vomiting.  Genitourinary:  Negative for dysuria, flank pain, frequency and urgency.  Musculoskeletal:  Positive for arthralgias (Left FA, ankle). Negative for back pain, myalgias and neck pain.  Neurological:  Negative for dizziness and headaches.  Hematological:  Does not bruise/bleed easily.  Psychiatric/Behavioral:  The patient is not nervous/anxious.    Blood pressure (!) 176/95, pulse 72, temperature 98.4 F (36.9 C),  temperature source Oral, resp. rate 12, SpO2 99 %. Physical Exam Constitutional:      General: She is not in acute distress.    Appearance: She is well-developed. She is not diaphoretic.  HENT:     Head: Normocephalic and atraumatic.  Eyes:     General: No scleral icterus.       Right eye: No discharge.        Left eye: No discharge.     Conjunctiva/sclera: Conjunctivae normal.  Cardiovascular:     Rate and Rhythm: Normal rate and regular rhythm.  Pulmonary:     Effort: Pulmonary effort is normal. No respiratory distress.  Musculoskeletal:     Cervical back: Normal range of motion.     Comments: Left shoulder, elbow, wrist, digits- Irregular laceration volar FA with some  tissue loss, mild purulent slough in wound, mod TTP, no instability, no blocks to motion  Sens  Ax/R/M/U intact though some numbness to tips of 2-4th fingers  Mot   Ax/ R/ PIN/ M/ AIN/ U intact  Rad 2+  Skin:    General: Skin is warm and dry.  Neurological:     Mental Status: She is alert.  Psychiatric:        Mood and Affect: Mood normal.        Behavior: Behavior normal.     Assessment/Plan: Left FA wound -- Will take to OR for debridement this afternoon. Wet-to-dry dressings q12 for now. Given degree of tissue loss she may need treatment with STSG or biologics down the road. She should f/u with Dr. Leanora Cover next week.    Lisette Abu, PA-C Orthopedic Surgery [phone] 02/27/2022, 9:10 AM   Addendum (02/27/2022) Patient seen and examined.  Agree with above. History: Patient states she was bitten by her dog while they were fighting 2 weeks ago.  Seen at the emergency department where the wounds were cleaned and sutured.  She was placed on antibiotics.  She followed up with her primary care physician yesterday and the wound had started to breakdown.  She was sent to the emergency department for evaluation.  No fevers chills or night sweats. Exam: Decree sensation in the index long ring and small  fingers left hand though she is able to feel light touch.  Intact capillary refill.  She can flex the fingers into the palm with the exception of the ring finger which she states does not move normally secondary to a dog bite a couple of years ago.  The right hand she has a sutured wound between the ring and small finger metacarpal heads without erythema.  Good range of motion the right hand.  Photographs in the chart show a wound on the mid dorsal forearm with necrotic tissue in the middle and some purulence.  No erythema. A/P: Left forearm dog bite wound with wound breakdown.  Recommend debridement in the operating room.  Risks, benefits and alternatives of surgery were discussed including risks of blood loss, infection, damage to nerves/vessels/tendons/ligament/bone, failure of surgery, need for additional surgery, complication with wound healing, stiffness, need for repeat irrigation and debridement.  She voiced understanding of these risks and elected to proceed.

## 2022-02-27 NOTE — ED Notes (Signed)
This RN unable to obtain 2nd set of cultures. Antibiotics started. Phlebotomist notified and will attempt for 2nd set once available.

## 2022-02-27 NOTE — Assessment & Plan Note (Signed)
-   check folate and B12

## 2022-02-27 NOTE — Hospital Course (Signed)
[name] is a 61 yo female with PMH depression, neuropathy, bipolar disorder, hard of hearing, fibromyalgia, HTN, hypothyroidism, PTSD who presented to the ER after a nonresolving dog bite to her left forearm. History and collateral information provided by the patient and her husband.  They state that the dogs were taken into quarantine approximately 2 weeks ago after the bite.  She was also on outpatient Augmentin but states that the wound on her left forearm has worsened the last couple days. She endorsed that she is up to date on current tetanus shot with last shot within the last 2 to 3 years.  They also state they have not heard anything from quarantine about the dogs so it is assumed they are asymptomatic.  She was seen initially for the dog bites 2 weeks ago when she was started on antibiotics as well. CT left leg showed no acute findings concerning for infection as she was also bitten in the leg.  CT left forearm showed a large soft tissue wound with subcutaneous gas. She was started on meropenem and clindamycin on admission. Hand surgery was also consulted and she was recommended for undergoing washout in the OR.

## 2022-02-27 NOTE — ED Provider Notes (Signed)
[facility] EMERGENCY DEPARTMENT Provider Note   CSN: [id number] Arrival date & time: 02/26/22  1654     History  Chief Complaint  Patient presents with   Wound Infection    [name] is a 61 y.o. female.  HPI 61 year old female who presents the ER today with worsening wound infections.  Patient was bit by dog on her left arm and left lower leg a couple weeks ago.  She had the wounds clean and was started on Augmentin and for the first week did fine but a couple days ago the wound on the left arm started having worsening pain erythema and draining purulent material that was malodorous.  Followed up with her primary doctor who suggested her to come to the ER for reevaluation.  Chills at home but no fever.  She also has a wound to her left lower leg that they did not notice any changes until the last few hours is now getting more red and also has drainage associated with it.    Home Medications Prior to Admission medications   Medication Sig Start Date End Date Taking? Authorizing Provider  acetaminophen (TYLENOL) 500 MG tablet Take 1,000 mg by mouth every 6 (six) hours as needed for mild pain or headache.     [provider]  ALPRAZolam Prudy Feeler) 1 MG tablet TAKE 1 TABLET BY MOUTH TWICE A DAY AS NEEDED FOR ANXIETY 02/13/22   Mozingo, Thereasa Solo, NP  amoxicillin-clavulanate (AUGMENTIN) 875-125 MG tablet Take 1 tablet by mouth every 12 (twelve) hours. 02/18/22   Myra Rude, MD  bisacodyl (DULCOLAX) 5 MG EC tablet Take 5 mg by mouth 2 (two) times daily as needed for moderate constipation.     [provider]  busPIRone (BUSPAR) 15 MG tablet Take 1 tablet (15 mg total) by mouth 3 (three) times daily. 10/28/21   Mozingo, Thereasa Solo, NP  ciprofloxacin (CILOXAN) 0.3 % ophthalmic solution 4 drops 2 (two) times daily. 01/05/22   [provider]  citalopram (CELEXA) 20 MG tablet Take 1 tablet (20 mg total) by mouth daily. 10/28/21    Mozingo, Thereasa Solo, NP  dexamethasone (DECADRON) 0.1 % ophthalmic solution 2 drops 2 (two) times daily. 01/06/22   [provider]  hydrochlorothiazide (HYDRODIURIL) 25 MG tablet Take 1 tablet (25 mg total) by mouth daily. APPOINTMENT NEEDED FOR FURTHER REFILLS CALL [phone] 07/03/20   Nahser, Deloris Ping, MD  HYDROcodone-acetaminophen (NORCO/VICODIN) 5-325 MG tablet Take 1 tablet by mouth every 6 (six) hours as needed. 02/16/22   Myra Rude, MD  hydrOXYzine (ATARAX/VISTARIL) 25 MG tablet TAKE 3 TO 4 TABLETS BY MOUTH EVERY 8 HOURS AS NEEDED FOR ANXIETY. MAX AMOUNT PER INSURANCE Patient taking differently: Take 75-100 mg by mouth every 8 (eight) hours as needed for anxiety. TAKE 3 TO 4 TABLETS BY MOUTH EVERY 8 HOURS AS NEEDED FOR ANXIETY. MAX AMOUNT PER INSURANCE 05/02/19   Sharlene Dory, DO  levothyroxine (SYNTHROID) 50 MCG tablet TAKE 1 TABLET BY MOUTH EVERY DAY Patient taking differently: Take 50 mcg by mouth daily. 02/14/19   Sharlene Dory, DO  lidocaine (LIDODERM) 5 % Place 3 patches onto the skin daily. Remove & Discard patch within 12 hours or as directed by MD 04/26/20   Genice Rouge, MD  loratadine (CLARITIN) 10 MG tablet Take 10 mg by mouth 2 (two) times daily as needed (for seasonal allergies).     [provider]  meclizine (ANTIVERT) 25 MG tablet Take 1 tablet (  25 mg total) by mouth 3 (three) times daily as needed for dizziness. 10/07/18   Sharlene Dory, DO  metoprolol tartrate (LOPRESSOR) 25 MG tablet Take 1 tablet (25 mg total) by mouth 2 (two) times daily. 02/13/22   Nahser, Deloris Ping, MD  naproxen (NAPROSYN) 500 MG tablet TAKE 1 TABLET DAILY AS NEEDED FOR PAIN. USE SPARINGLY. 02/02/22   Sharlene Dory, DO  orphenadrine (NORFLEX) 100 MG tablet Take 1 tablet (100 mg total) by mouth 2 (two) times daily as needed for muscle spasms. 11/03/21   Lovorn, Aundra Millet, MD  risperiDONE (RISPERDAL) 1 MG tablet TAKE 1 TABLET BY MOUTH EVERYDAY AT  BEDTIME 10/28/21   Mozingo, Thereasa Solo, NP  rivaroxaban (XARELTO) 20 MG TABS tablet Take 1 tablet (20 mg total) by mouth daily with supper. 12/05/21   Wendling, Jilda Roche, DO  tiZANidine (ZANAFLEX) 4 MG tablet TAKE 1 & 1/2 TO 2 (ONE & ONE-HALF TO TWO) TABLETS BY MOUTH THREE TIMES DAILY 12/29/21   Lovorn, Aundra Millet, MD      Allergies    Morphine and related, Oxycodone, Lyrica [pregabalin], and Neurontin [gabapentin]    Review of Systems   Review of Systems  Physical Exam Updated Vital Signs BP (!) 176/95   Pulse 72   Temp 98.4 F (36.9 C) (Oral)   Resp 12   SpO2 99%  Physical Exam Vitals and nursing note reviewed.  Constitutional:      Appearance: She is well-developed.  HENT:     Head: Normocephalic and atraumatic.  Eyes:     Pupils: Pupils are equal, round, and reactive to light.  Cardiovascular:     Rate and Rhythm: Normal rate and regular rhythm.  Pulmonary:     Effort: Pulmonary effort is normal. No respiratory distress.     Breath sounds: No stridor.  Abdominal:     General: Abdomen is flat. There is no distension.  Musculoskeletal:     Cervical back: Normal range of motion.  Skin:    General: Skin is warm and dry.     Comments: See pictures below for pictorial description of the wound taken in office today.  Neurological:     General: No focal deficit present.     Mental Status: She is alert.          ED Results / Procedures / Treatments   Labs (all labs ordered are listed, but only abnormal results are displayed) Labs Reviewed  COMPREHENSIVE METABOLIC PANEL - Abnormal; Notable for the following components:      Result Value   Sodium 129 (*)    Chloride 93 (*)    Albumin 3.4 (*)    All other components within normal limits  CBC WITH DIFFERENTIAL/PLATELET - Abnormal; Notable for the following components:   RBC 2.97 (*)    Hemoglobin 10.2 (*)    HCT 30.4 (*)    MCV 102.4 (*)    MCH 34.3 (*)    Platelets 602 (*)    All other components within  normal limits  CBC WITH DIFFERENTIAL/PLATELET - Abnormal; Notable for the following components:   RBC 2.75 (*)    Hemoglobin 9.4 (*)    HCT 27.1 (*)    MCH 34.2 (*)    Platelets 550 (*)    All other components within normal limits  COMPREHENSIVE METABOLIC PANEL - Abnormal; Notable for the following components:   Sodium 133 (*)    Chloride 97 (*)    Glucose, Bld 117 (*)    Calcium  8.7 (*)    Total Protein 6.0 (*)    Albumin 3.0 (*)    AST 12 (*)    All other components within normal limits  CULTURE, BLOOD (ROUTINE X 2)  CULTURE, BLOOD (ROUTINE X 2)  MAGNESIUM  PHOSPHORUS    EKG None  Radiology CT TIBIA FIBULA LEFT W CONTRAST  Result Date: 02/27/2022 CLINICAL DATA:  Dog bite EXAM: CT OF THE LOWER LEFT EXTREMITY WITH CONTRAST TECHNIQUE: Multidetector CT imaging of the lower left extremity was performed according to the standard protocol following intravenous contrast administration. RADIATION DOSE REDUCTION: This exam was performed according to the departmental dose-optimization program which includes automated exposure control, adjustment of the mA and/or kV according to patient size and/or use of iterative reconstruction technique. CONTRAST:  25mL OMNIPAQUE IOHEXOL 300 MG/ML  SOLN COMPARISON:  None Available. FINDINGS: Bones/Joint/Cartilage Normal Ligaments Suboptimally assessed by CT. Muscles and Tendons Unremarkable Soft tissues No fluid collection or tracking soft tissue gas. IMPRESSION: No specific findings of infection in the left leg. Electronically Signed   By: Deatra Robinson M.D.   On: 02/27/2022 03:23   CT FOREARM LEFT W CONTRAST  Result Date: 02/27/2022 CLINICAL DATA:  Dog bites EXAM: CT OF THE UPPER LEFT EXTREMITY WITH CONTRAST TECHNIQUE: Multidetector CT imaging of the upper left extremity was performed according to the standard protocol following intravenous contrast administration. RADIATION DOSE REDUCTION: This exam was performed according to the departmental  dose-optimization program which includes automated exposure control, adjustment of the mA and/or kV according to patient size and/or use of iterative reconstruction technique. CONTRAST:  73mL OMNIPAQUE IOHEXOL 300 MG/ML  SOLN COMPARISON:  None Available. FINDINGS: Bones/Joint/Cartilage Normal Ligaments Suboptimally assessed by CT. Muscles and Tendons Unremarkable Soft tissues There is a large soft tissue wound superficial to the left radius. There is no fluid collection. There is a small amount of subcutaneous gas but no gas tracking along fascial planes. IMPRESSION: Large soft tissue wound superficial to the left radius without fluid collection. Electronically Signed   By: Deatra Robinson M.D.   On: 02/27/2022 03:21   DG Forearm Right  Result Date: 02/26/2022 CLINICAL DATA:  Recent dog bite with local infection, initial encounter EXAM: RIGHT FOREARM - 2 VIEW COMPARISON:  None Available. FINDINGS: No acute fracture or dislocation is noted. No focal soft tissue abnormality is noted to correspond with the given clinical history. IMPRESSION: No acute bony abnormality noted. Electronically Signed   By: Alcide Clever M.D.   On: 02/26/2022 19:29   DG Foot Complete Right  Result Date: 02/26/2022 CLINICAL DATA:  infection.  Dog bite EXAM: RIGHT FOOT COMPLETE - 3+ VIEW COMPARISON:  None Available. FINDINGS: No cortical erosion or destruction. There is no evidence of fracture or dislocation. There is no evidence of arthropathy or other focal bone abnormality. Plantar calcaneal spur. Dorsal forefoot/midfoot subcutaneus soft tissue edema. No retained radiopaque foreign body. IMPRESSION: 1. No radiographic findings to  suggest osteomyelitis. 2.  No acute displaced fracture or dislocation. 3. No retained radiopaque foreign body. Electronically Signed   By: Tish Frederickson M.D.   On: 02/26/2022 19:27    Procedures Procedures    Medications Ordered in ED Medications  rivaroxaban (XARELTO) tablet 20 mg (has no  administration in time range)  levothyroxine (SYNTHROID) tablet 50 mcg (50 mcg Oral Not Given 02/27/22 0608)  citalopram (CELEXA) tablet 20 mg (has no administration in time range)  busPIRone (BUSPAR) tablet 15 mg (has no administration in time range)  acetaminophen (TYLENOL) tablet 650  mg (has no administration in time range)  HYDROmorphone (DILAUDID) injection 0.5 mg (0.5 mg Intravenous Given 02/27/22 0629)  0.9 %  sodium chloride infusion ( Intravenous New Bag/Given 02/27/22 0600)  prochlorperazine (COMPAZINE) injection 10 mg (has no administration in time range)  clindamycin (CLEOCIN) IVPB 900 mg (0 mg Intravenous Stopped 02/27/22 0701)  meropenem (MERREM) 1 g in sodium chloride 0.9 % 100 mL IVPB (has no administration in time range)  vancomycin (VANCOREADY) IVPB 1250 mg/250 mL (0 mg Intravenous Stopped 02/27/22 0349)  lactated ringers bolus 1,000 mL (0 mLs Intravenous Stopped 02/27/22 0349)  fentaNYL (SUBLIMAZE) injection 50 mcg (50 mcg Intravenous Given 02/27/22 0125)  iohexol (OMNIPAQUE) 300 MG/ML solution 100 mL (90 mLs Intravenous Contrast Given 02/27/22 0249)    ED Course/ Medical Decision Making/ A&P                           Medical Decision Making Amount and/or Complexity of Data Reviewed Radiology: ordered.  Risk Prescription drug management. Decision regarding hospitalization.   We will CT scan and the areas of concern to see if there is any deep-seated abscess that needs surgical consultation.  Will initiate broad-spectrum antibiotics, fluids and add on lactic acid and blood cultures as well.  Suspect will need to be admitted at least for IV antibiotics for cellulitis and possible superficial infection but she has something deep will need to coordinate with appropriate surgeon.  CT with no evidence of abscess in either leg or arm (reviewed and independently interpreted by myself and radiology read reviewed as well).   No indication for surgical consultation at this time. Discussed  with Dr. Margo Aye for admission.   Final Clinical Impression(s) / ED Diagnoses Final diagnoses:  Cellulitis of left upper extremity  Wound infection    Rx / DC Orders ED Discharge Orders     None         Veona Bittman, Barbara Cower, MD 02/27/22 [phone]

## 2022-02-27 NOTE — Transfer of Care (Signed)
Immediate Anesthesia Transfer of Care Note  Patient: [name]  Procedure(s) Performed: IRRIGATION AND DEBRIDEMENT FOREARM (Left)  Patient Location: PACU  Anesthesia Type:General  Level of Consciousness: drowsy and patient cooperative  Airway & Oxygen Therapy: Patient Spontanous Breathing and Patient connected to nasal cannula oxygen  Post-op Assessment: Report given to RN, Post -op Vital signs reviewed and stable and Patient moving all extremities  Post vital signs: Reviewed and stable  Last Vitals:  Vitals Value Taken Time  BP 161/92 02/27/22 1657  Temp    Pulse 77 02/27/22 1659  Resp 15 02/27/22 1659  SpO2 99 % 02/27/22 1659  Vitals shown include unvalidated device data.  Last Pain:  Vitals:   02/27/22 1534  TempSrc:   PainSc: 8          Complications: No notable events documented.

## 2022-02-27 NOTE — Discharge Instructions (Signed)

## 2022-02-27 NOTE — Progress Notes (Addendum)
Pharmacy Antibiotic Note  [name] is a 61 y.o. female admitted on 02/26/2022 with infected dog bite.  Pharmacy has been consulted for meropenem dosing.  Initially vanc and cefepime were ordered; d/w Dr Margo Aye re: coverage for Pasteurella, anaerobes, and staphylococci >> meropenem and clindamycin provides appropriate coverage at this time after failure of outpt Augmentin.  Plan: Meropenem 1g IV Q8H. Clindamycin 900mg  IV Q6H.  Temp (24hrs), Avg:98.3 F (36.8 C), Min:98.1 F (36.7 C), Max:98.4 F (36.9 C)  Recent Labs  Lab 02/26/22 1843 02/27/22 0507  WBC 9.0 8.1  CREATININE 0.63 0.68    Estimated Creatinine Clearance: 70.9 mL/min (by C-G formula based on SCr of 0.68 mg/dL).    Allergies  Allergen Reactions   Morphine And Related Other (See Comments)    "Didn't agree with me/wasn't very effective.."   Oxycodone    Lyrica [Pregabalin] Rash   Neurontin [Gabapentin] Other (See Comments)    Left patient sleepy all the time     Thank you for allowing pharmacy to be a part of this patient's care.  04/30/22, PharmD, BCPS  02/27/2022 6:12 AM

## 2022-02-27 NOTE — Anesthesia Procedure Notes (Signed)
Procedure Name: LMA Insertion Date/Time: 02/27/2022 4:00 PM  Performed by: Audie Pinto, CRNAPre-anesthesia Checklist: Patient identified, Emergency Drugs available, Suction available and Patient being monitored Patient Re-evaluated:Patient Re-evaluated prior to induction Oxygen Delivery Method: Circle system utilized Preoxygenation: Pre-oxygenation with 100% oxygen Induction Type: IV induction LMA: LMA inserted LMA Size: 4.0 Placement Confirmation: positive ETCO2 Dental Injury: Teeth and Oropharynx as per pre-operative assessment

## 2022-02-27 NOTE — Assessment & Plan Note (Signed)
-   Home regimen resumed at discharge 

## 2022-02-27 NOTE — Assessment & Plan Note (Signed)
Improved with fluids 

## 2022-02-28 ENCOUNTER — Encounter (HOSPITAL_COMMUNITY): Payer: Self-pay | Admitting: Orthopedic Surgery

## 2022-02-28 DIAGNOSIS — I10 Essential (primary) hypertension: Secondary | ICD-10-CM | POA: Diagnosis not present

## 2022-02-28 DIAGNOSIS — W540XXD Bitten by dog, subsequent encounter: Secondary | ICD-10-CM | POA: Diagnosis not present

## 2022-02-28 LAB — BASIC METABOLIC PANEL
Anion gap: 8 (ref 5–15)
BUN: 5 mg/dL — ABNORMAL LOW (ref 6–20)
CO2: 24 mmol/L (ref 22–32)
Calcium: 8.5 mg/dL — ABNORMAL LOW (ref 8.9–10.3)
Chloride: 101 mmol/L (ref 98–111)
Creatinine, Ser: 0.69 mg/dL (ref 0.44–1.00)
GFR, Estimated: >60 mL/min (ref >60)
Glucose, Bld: 136 mg/dL — ABNORMAL HIGH (ref 70–99)
Potassium: 4.1 mmol/L (ref 3.5–5.1)
Sodium: 133 mmol/L — ABNORMAL LOW (ref 135–145)

## 2022-02-28 LAB — CBC WITH DIFFERENTIAL/PLATELET
Abs Immature Granulocytes: 0.03 10*3/uL (ref 0.00–0.07)
Basophils Absolute: 0 10*3/uL (ref 0.0–0.1)
Basophils Relative: 0 %
Eosinophils Absolute: 0 10*3/uL (ref 0.0–0.5)
Eosinophils Relative: 0 %
HCT: 31.7 % — ABNORMAL LOW (ref 36.0–46.0)
Hemoglobin: 10.8 g/dL — ABNORMAL LOW (ref 12.0–15.0)
Immature Granulocytes: 1 %
Lymphocytes Relative: 8 %
Lymphs Abs: 0.5 10*3/uL — ABNORMAL LOW (ref 0.7–4.0)
MCH: 33.9 pg (ref 26.0–34.0)
MCHC: 34.1 g/dL (ref 30.0–36.0)
MCV: 99.4 fL (ref 80.0–100.0)
Monocytes Absolute: 0.2 10*3/uL (ref 0.1–1.0)
Monocytes Relative: 4 %
Neutro Abs: 5.2 10*3/uL (ref 1.7–7.7)
Neutrophils Relative %: 87 %
Platelets: 434 10*3/uL — ABNORMAL HIGH (ref 150–400)
RBC: 3.19 MIL/uL — ABNORMAL LOW (ref 3.87–5.11)
RDW: 13.6 % (ref 11.5–15.5)
WBC: 5.9 10*3/uL (ref 4.0–10.5)
nRBC: 0 % (ref 0.0–0.2)

## 2022-02-28 LAB — HIV ANTIBODY (ROUTINE TESTING W REFLEX): HIV Screen 4th Generation wRfx: NONREACTIVE

## 2022-02-28 LAB — MAGNESIUM: Magnesium: 2.2 mg/dL (ref 1.7–2.4)

## 2022-02-28 MED ORDER — OXYCODONE HCL 5 MG PO TABS: 5.0000 mg | ORAL_TABLET | ORAL | 0 refills | Status: AC | PRN

## 2022-02-28 MED ORDER — METRONIDAZOLE 500 MG PO TABS
500.0000 mg | ORAL_TABLET | Freq: Three times a day (TID) | ORAL | Status: DC
Start: 2022-02-28 — End: 2022-02-28
  Administered 2022-02-28: 500 mg via ORAL
  Filled 2022-02-28: qty 1

## 2022-02-28 MED ORDER — SULFAMETHOXAZOLE-TRIMETHOPRIM 800-160 MG PO TABS
1.0000 | ORAL_TABLET | Freq: Two times a day (BID) | ORAL | Status: DC
  Administered 2022-02-28: 1 via ORAL
  Filled 2022-02-28 (×2): qty 1

## 2022-02-28 MED ORDER — TIZANIDINE HCL 4 MG PO TABS: 6.0000 mg | ORAL_TABLET | Freq: Three times a day (TID) | ORAL | Status: DC | PRN

## 2022-02-28 MED ORDER — SULFAMETHOXAZOLE-TRIMETHOPRIM 800-160 MG PO TABS: 1.0000 | ORAL_TABLET | Freq: Two times a day (BID) | ORAL | 0 refills | Status: AC

## 2022-02-28 MED ORDER — METRONIDAZOLE 500 MG PO TABS: 500.0000 mg | ORAL_TABLET | Freq: Three times a day (TID) | ORAL | 0 refills | Status: AC

## 2022-02-28 NOTE — Anesthesia Postprocedure Evaluation (Signed)
Anesthesia Post Note  Patient: [name]  Procedure(s) Performed: IRRIGATION AND DEBRIDEMENT FOREARM (Left)     Patient location during evaluation: PACU Anesthesia Type: General Level of consciousness: awake and alert Pain management: pain level controlled Vital Signs Assessment: post-procedure vital signs reviewed and stable Respiratory status: spontaneous breathing, nonlabored ventilation and respiratory function stable Cardiovascular status: blood pressure returned to baseline and stable Postop Assessment: no apparent nausea or vomiting Anesthetic complications: no   No notable events documented.  Last Vitals:  Vitals:   02/28/22 0432 02/28/22 0734  BP: (!) 155/83 (!) 158/89  Pulse: 91 83  Resp: 16 17  Temp: 36.5 C 36.5 C  SpO2: 100% 97%    Last Pain:  Vitals:   02/28/22 0734  TempSrc: Oral  PainSc:                  Alya Smaltz

## 2022-02-28 NOTE — Progress Notes (Signed)
Pt discharged to home with family.  Pt taken off telemetry and CCMD notified.  Pt's IV removed.  Pt left with all of their personal belongings.  AVS documentation reviewed and sent home with Pt and all questions were answered.    

## 2022-02-28 NOTE — Discharge Summary (Signed)
Physician Discharge Summary   [name] FYB:[id number] DOB: August 29, 1960 DOA: 02/26/2022  PCP: Jeanette Dory, DO  Admit date: 02/26/2022 Discharge date: 02/28/2022   Admitted From: Home Disposition:  Home Discharging physician: Jeanette Chamber, MD  Recommendations for Outpatient Follow-up:  Follow up with [name]  Discharge Condition: stable CODE STATUS: Full Diet recommendation:  Diet Orders (From admission, onward)     Start     Ordered   02/28/22 0640  Diet regular Room service appropriate? Yes; Fluid consistency: Thin  Diet effective now       Question Answer Comment  Room service appropriate? Yes   Fluid consistency: Thin      02/28/22 0639   02/28/22 0000  Diet general        02/28/22 1053            Hospital Course: [name] is a 61 yo female with PMH depression, neuropathy, bipolar disorder, hard of hearing, fibromyalgia, HTN, hypothyroidism, PTSD who presented to the ER after a nonresolving dog bite to her left forearm. History and collateral information provided by the patient and her husband.  They state that the dogs were taken into quarantine approximately 2 weeks ago after the bite.  She was also on outpatient Augmentin but states that the wound on her left forearm has worsened the last couple days. She endorsed that she is up to date on current tetanus shot with last shot within the last 2 to 3 years.  They also state they have not heard anything from quarantine about the dogs so it is assumed they are asymptomatic.  She was seen initially for the dog bites 2 weeks ago when she was started on antibiotics as well. CT left leg showed no acute findings concerning for infection as she was also bitten in the leg.  CT left forearm showed a large soft tissue wound with subcutaneous gas. She was started on meropenem and clindamycin on admission. Hand surgery was also consulted and she was recommended for undergoing washout in the OR.  She tolerated  surgery well and will plan to follow-up outpatient with [name].  She was continued on bactrim and flagyl for coverage at discharge as well.   Assessment and Plan: * Dog bite - bitten approx 2 weeks ago to left leg and left forearm. Left leg has sutures in place and healing. Left forearm has ongoing wound that has worsened recently. Does not appear to warrant need to treat for tetanus or rabies (she is uptodate on tetanus and dogs have been in quarantine > 10 days with no sxms) - evaluated by hand surgery with rec's for undergoing OR washout; performed on 02/27/22 - s/p meropenem and clindamycin during hospitalization.  Transitioned to Bactrim and Flagyl to complete course at discharge - Outpatient follow-up with [name]  History of DVT (deep vein thrombosis) - Found to have age-indeterminate DVT involving right proximal profunda vein on 06/21/2018 - Remains on Xarelto chronically -Xarelto resumed at discharge  Bipolar disorder (HCC) - Continue Risperdal  Hypothyroidism - Continue Synthroid  Macrocytic anemia - Folate and B12 levels appropriate  Thrombocytosis - Considered reactive in setting of acute infection -Platelets had downtrended prior to discharge  Hyponatremia - Improved with fluids  GAD (generalized anxiety disorder) - Resume Xanax.  Verified on database -Continue BuSpar  Hypertension - Home regimen resumed at discharge       The patient's chronic medical conditions were treated accordingly per the patient's home medication regimen except as noted.  On day of discharge, patient was felt deemed stable for discharge. Patient/family member advised to call PCP or come back to ER if needed.   Principal Diagnosis: Dog bite  Discharge Diagnoses: Active Hospital Problems   Diagnosis Date Noted   Dog bite 02/27/2022    Priority: 1.   History of DVT (deep vein thrombosis) 02/27/2022    Priority: 2.   Hyponatremia 02/27/2022   Thrombocytosis 02/27/2022    Macrocytic anemia 02/27/2022   Hypothyroidism 02/27/2022   Bipolar disorder (HCC) 02/27/2022   GAD (generalized anxiety disorder) 03/09/2018   Hypertension 08/27/2014    Resolved Hospital Problems  No resolved problems to display.     Discharge Instructions     Diet general   Complete by: As directed    Increase activity slowly   Complete by: As directed    Leave dressing on - Keep it clean, dry, and intact until clinic visit   Complete by: As directed       Allergies as of 02/28/2022       Reactions   Morphine And Related Other (See Comments)   "Didn't agree with me/wasn't very effective.."   Oxycodone    Lyrica [pregabalin] Rash   Neurontin [gabapentin] Other (See Comments)   Left patient sleepy all the time        Medication List     STOP taking these medications    amoxicillin-clavulanate 875-125 MG tablet Commonly known as: AUGMENTIN   HYDROcodone-acetaminophen 5-325 MG tablet Commonly known as: NORCO/VICODIN   lidocaine 5 % Commonly known as: Lidoderm       TAKE these medications    acetaminophen 500 MG tablet Commonly known as: TYLENOL Take 1,000 mg by mouth every 6 (six) hours as needed for mild pain or headache.   ALPRAZolam 1 MG tablet Commonly known as: XANAX TAKE 1 TABLET BY MOUTH TWICE A DAY AS NEEDED FOR ANXIETY What changed: See the new instructions.   bisacodyl 5 MG EC tablet Commonly known as: DULCOLAX Take 5 mg by mouth 2 (two) times daily as needed for moderate constipation.   busPIRone 15 MG tablet Commonly known as: BUSPAR Take 1 tablet (15 mg total) by mouth 3 (three) times daily.   ciprofloxacin 0.3 % ophthalmic solution Commonly known as: CILOXAN 4 drops See admin instructions. Instill to right ear 2 times daily as needed for ear infection   citalopram 20 MG tablet Commonly known as: CELEXA Take 1 tablet (20 mg total) by mouth daily.   dexamethasone 0.1 % ophthalmic solution Commonly known as: DECADRON 2 drops See  admin instructions. Instill into right ear as needed for ear infection   hydrochlorothiazide 25 MG tablet Commonly known as: HYDRODIURIL Take 1 tablet (25 mg total) by mouth daily. APPOINTMENT NEEDED FOR FURTHER REFILLS CALL [phone] What changed: additional instructions   hydrOXYzine 25 MG tablet Commonly known as: ATARAX TAKE 3 TO 4 TABLETS BY MOUTH EVERY 8 HOURS AS NEEDED FOR ANXIETY. MAX AMOUNT PER INSURANCE What changed: See the new instructions.   levothyroxine 50 MCG tablet Commonly known as: SYNTHROID TAKE 1 TABLET BY MOUTH EVERY DAY   loratadine 10 MG tablet Commonly known as: CLARITIN Take 10 mg by mouth 2 (two) times daily as needed for allergies (for seasonal allergies).   meclizine 25 MG tablet Commonly known as: ANTIVERT Take 1 tablet (25 mg total) by mouth 3 (three) times daily as needed for dizziness.   metoprolol tartrate 25 MG tablet Commonly known as: LOPRESSOR Take 1 tablet (25  mg total) by mouth 2 (two) times daily.   metroNIDAZOLE 500 MG tablet Commonly known as: FLAGYL Take 1 tablet (500 mg total) by mouth 3 (three) times daily for 7 days.   naproxen 500 MG tablet Commonly known as: NAPROSYN TAKE 1 TABLET DAILY AS NEEDED FOR PAIN. USE SPARINGLY. What changed: See the new instructions.   orphenadrine 100 MG tablet Commonly known as: NORFLEX Take 1 tablet (100 mg total) by mouth 2 (two) times daily as needed for muscle spasms. What changed: when to take this   oxyCODONE 5 MG immediate release tablet Commonly known as: Roxicodone Take 1 tablet (5 mg total) by mouth every 4 (four) hours as needed for up to 5 days.   risperiDONE 1 MG tablet Commonly known as: RISPERDAL TAKE 1 TABLET BY MOUTH EVERYDAY AT BEDTIME What changed:  how much to take how to take this when to take this additional instructions   rivaroxaban 20 MG Tabs tablet Commonly known as: XARELTO Take 1 tablet (20 mg total) by mouth daily with supper.    sulfamethoxazole-trimethoprim 800-160 MG tablet Commonly known as: BACTRIM DS Take 1 tablet by mouth every 12 (twelve) hours for 7 days.   tiZANidine 4 MG tablet Commonly known as: ZANAFLEX TAKE 1 & 1/2 TO 2 (ONE & ONE-HALF TO TWO) TABLETS BY MOUTH THREE TIMES DAILY What changed:  how much to take how to take this when to take this reasons to take this additional instructions               Discharge Care Instructions  (From admission, onward)           Start     Ordered   02/28/22 0000  Leave dressing on - Keep it clean, dry, and intact until clinic visit        02/28/22 1053            Follow-up Information     Betha Loa, MD. Call.   Specialty: Orthopedic Surgery Contact information: [address] [place] [address] [phone]                Allergies  Allergen Reactions   Morphine And Related Other (See Comments)    "Didn't agree with me/wasn't very effective.."   Oxycodone    Lyrica [Pregabalin] Rash   Neurontin [Gabapentin] Other (See Comments)    Left patient sleepy all the time    Consultations: Hand surgery  Procedures:  Irrigation and debridement left forearm dog bite including skin subcutaneous tissues and muscle approximately 7 x 4 cm, 02/27/22  Discharge Exam: BP (!) 158/89 (BP Location: Right Arm)   Pulse (!) 103   Temp 97.7 F (36.5 C) (Oral)   Resp 18   Ht 5\' 6"  (1.676 m)   Wt 67.1 kg   SpO2 98%   BMI 23.88 kg/m  Physical Exam Constitutional:      Appearance: Normal appearance.  HENT:     Head: Normocephalic and atraumatic.     Mouth/Throat:     Mouth: Mucous membranes are moist.  Cardiovascular:     Rate and Rhythm: Normal rate and regular rhythm.  Pulmonary:     Effort: Pulmonary effort is normal.     Breath sounds: Normal breath sounds.  Abdominal:     General: Bowel sounds are normal. There is no distension.     Palpations: Abdomen is soft.  Musculoskeletal:     Cervical back: Normal range  of motion and neck supple.  Comments: L forearm now wrapped in surgical bandage   Skin:    General: Skin is warm.  Neurological:     General: No focal deficit present.     Mental Status: She is alert.  Psychiatric:        Mood and Affect: Mood normal.        Behavior: Behavior normal.      The results of significant diagnostics from this hospitalization (including imaging, microbiology, ancillary and laboratory) are listed below for reference.   Microbiology: Recent Results (from the past 240 hour(s))  Blood culture (routine x 2)     Status: None (Preliminary result)   Collection Time: 02/27/22  1:27 AM   Specimen: BLOOD  Result Value Ref Range Status   Specimen Description BLOOD LEFT ANTECUBITAL  Final   Special Requests   Final    BOTTLES DRAWN AEROBIC AND ANAEROBIC Blood Culture adequate volume   Culture   Final    NO GROWTH 1 DAY Performed at [facility], [address] [place], [place] [address]    Report Status PENDING  Incomplete  Blood culture (routine x 2)     Status: None (Preliminary result)   Collection Time: 02/27/22  2:56 AM   Specimen: BLOOD RIGHT HAND  Result Value Ref Range Status   Specimen Description BLOOD RIGHT HAND  Final   Special Requests AEROBIC BOTTLE ONLY Blood Culture adequate volume  Final   Culture   Final    NO GROWTH 1 DAY Performed at [facility], [address], [place], [place] [address]    Report Status PENDING  Incomplete  Surgical pcr screen     Status: None   Collection Time: 02/27/22  2:33 PM   Specimen: Nasal Mucosa; Nasal Swab  Result Value Ref Range Status   MRSA, PCR NEGATIVE NEGATIVE Final   Staphylococcus aureus NEGATIVE NEGATIVE Final    Comment: (NOTE) The Xpert SA Assay (FDA approved for NASAL specimens in patients 31 years of age and older), is one component of a comprehensive surveillance program. It is not intended to diagnose infection nor to guide or monitor treatment. Performed at [facility], [address], [place], [place] [address]   Aerobic/Anaerobic Culture w Gram Stain (surgical/deep wound)     Status: None (Preliminary result)   Collection Time: 02/27/22  4:18 PM   Specimen: Arm, Left; Wound  Result Value Ref Range Status   Specimen Description WOUND  Final   Special Requests LEFT FOREARM  Final   Gram Stain   Final    ABUNDANT WBC PRESENT, PREDOMINANTLY PMN RARE GRAM POSITIVE COCCI IN PAIRS RARE GRAM NEGATIVE RODS    Culture   Final    MODERATE PASTEURELLA MULTOCIDA Usually susceptible to penicillin and other beta lactam agents,quinolones,macrolides and tetracyclines. Performed at [facility], [address], [place], [place] [address]    Report Status PENDING  Incomplete     Labs: BNP (last 3 results) No results for input(s): "BNP" in the last 8760 hours. Basic Metabolic Panel: Recent Labs  Lab 02/26/22 1843 02/27/22 0507 02/28/22 0113  NA 129* 133* 133*  K 3.6 3.8 4.1  CL 93* 97* 101  CO2 22 23 24   GLUCOSE 83 117* 136*  BUN 6 6 5*  CREATININE 0.63 0.68 0.69  CALCIUM 9.0 8.7* 8.5*  MG  --  2.0 2.2  PHOS  --  3.4  --    Liver Function Tests: Recent Labs  Lab 02/26/22 1843 02/27/22 0507  AST 15 12*  ALT 15 14  ALKPHOS 76 68  BILITOT 0.3 0.6  PROT 7.0 6.0*  ALBUMIN 3.4* 3.0*   No results for input(s): "LIPASE", "AMYLASE" in the last 168 hours. No results for input(s): "AMMONIA" in the last 168 hours. CBC: Recent Labs  Lab 02/26/22 1843 02/27/22 0507 02/28/22 0113  WBC 9.0 8.1 5.9  NEUTROABS 5.2 5.7 5.2  HGB 10.2* 9.4* 10.8*  HCT 30.4* 27.1* 31.7*  MCV 102.4* 98.5 99.4  PLT 602* 550* 434*   Cardiac Enzymes: No results for input(s): "CKTOTAL", "CKMB", "CKMBINDEX", "TROPONINI" in the last 168 hours. BNP: Invalid input(s): "POCBNP" CBG: No results for input(s): "GLUCAP" in the last 168 hours. D-Dimer No results for input(s): "DDIMER" in the last 72 hours. Hgb A1c No results for input(s): "HGBA1C" in the last  72 hours. Lipid Profile No results for input(s): "CHOL", "HDL", "LDLCALC", "TRIG", "CHOLHDL", "LDLDIRECT" in the last 72 hours. Thyroid function studies No results for input(s): "TSH", "T4TOTAL", "T3FREE", "THYROIDAB" in the last 72 hours.  Invalid input(s): "FREET3" Anemia work up Recent Labs    02/27/22 1836  VITAMINB12 433  FOLATE 18.1   Urinalysis    Component Value Date/Time   COLORURINE YELLOW 06/11/2009 1814   APPEARANCEUR CLEAR 06/11/2009 1814   LABSPEC 1.011 06/11/2009 1814   PHURINE 6.5 06/11/2009 1814   GLUCOSEU NEGATIVE 06/11/2009 1814   HGBUR SMALL (A) 06/11/2009 1814   BILIRUBINUR NEGATIVE 06/11/2009 1814   KETONESUR 40 (A) 06/11/2009 1814   PROTEINUR NEGATIVE 06/11/2009 1814   UROBILINOGEN 1.0 06/11/2009 1814   NITRITE NEGATIVE 06/11/2009 1814   LEUKOCYTESUR NEGATIVE 06/11/2009 1814   Sepsis Labs Recent Labs  Lab 02/26/22 1843 02/27/22 0507 02/28/22 0113  WBC 9.0 8.1 5.9   Microbiology Recent Results (from the past 240 hour(s))  Blood culture (routine x 2)     Status: None (Preliminary result)   Collection Time: 02/27/22  1:27 AM   Specimen: BLOOD  Result Value Ref Range Status   Specimen Description BLOOD LEFT ANTECUBITAL  Final   Special Requests   Final    BOTTLES DRAWN AEROBIC AND ANAEROBIC Blood Culture adequate volume   Culture   Final    NO GROWTH 1 DAY Performed at [facility], [address], [place], [place] [address]    Report Status PENDING  Incomplete  Blood culture (routine x 2)     Status: None (Preliminary result)   Collection Time: 02/27/22  2:56 AM   Specimen: BLOOD RIGHT HAND  Result Value Ref Range Status   Specimen Description BLOOD RIGHT HAND  Final   Special Requests AEROBIC BOTTLE ONLY Blood Culture adequate volume  Final   Culture   Final    NO GROWTH 1 DAY Performed at [facility], [address], [place], [place] [address]    Report Status PENDING  Incomplete  Surgical pcr screen     Status: None    Collection Time: 02/27/22  2:33 PM   Specimen: Nasal Mucosa; Nasal Swab  Result Value Ref Range Status   MRSA, PCR NEGATIVE NEGATIVE Final   Staphylococcus aureus NEGATIVE NEGATIVE Final    Comment: (NOTE) The Xpert SA Assay (FDA approved for NASAL specimens in patients 34 years of age and older), is one component of a comprehensive surveillance program. It is not intended to diagnose infection nor to guide or monitor treatment. Performed at [facility], [address] [place], [place] [address]  Aerobic/Anaerobic Culture w Gram Stain (surgical/deep wound)     Status: None (Preliminary result)   Collection Time: 02/27/22  4:18 PM   Specimen: Arm, Left; Wound  Result Value Ref Range Status   Specimen Description WOUND  Final   Special Requests LEFT FOREARM  Final   Gram Stain   Final    ABUNDANT WBC PRESENT, PREDOMINANTLY PMN RARE GRAM POSITIVE COCCI IN PAIRS RARE GRAM NEGATIVE RODS    Culture   Final    MODERATE PASTEURELLA MULTOCIDA Usually susceptible to penicillin and other beta lactam agents,quinolones,macrolides and tetracyclines. Performed at [facility], [address], [place], [place] [address]    Report Status PENDING  Incomplete    Procedures/Studies: CT TIBIA FIBULA LEFT W CONTRAST  Result Date: 02/27/2022 CLINICAL DATA:  Dog bite EXAM: CT OF THE LOWER LEFT EXTREMITY WITH CONTRAST TECHNIQUE: Multidetector CT imaging of the lower left extremity was performed according to the standard protocol following intravenous contrast administration. RADIATION DOSE REDUCTION: This exam was performed according to the departmental dose-optimization program which includes automated exposure control, adjustment of the mA and/or kV according to patient size and/or use of iterative reconstruction technique. CONTRAST:  33mL OMNIPAQUE IOHEXOL 300 MG/ML  SOLN COMPARISON:  None Available. FINDINGS: Bones/Joint/Cartilage Normal Ligaments Suboptimally assessed by CT. Muscles and  Tendons Unremarkable Soft tissues No fluid collection or tracking soft tissue gas. IMPRESSION: No specific findings of infection in the left leg. Electronically Signed   By: Deatra Robinson M.D.   On: 02/27/2022 03:23   CT FOREARM LEFT W CONTRAST  Result Date: 02/27/2022 CLINICAL DATA:  Dog bites EXAM: CT OF THE UPPER LEFT EXTREMITY WITH CONTRAST TECHNIQUE: Multidetector CT imaging of the upper left extremity was performed according to the standard protocol following intravenous contrast administration. RADIATION DOSE REDUCTION: This exam was performed according to the departmental dose-optimization program which includes automated exposure control, adjustment of the mA and/or kV according to patient size and/or use of iterative reconstruction technique. CONTRAST:  79mL OMNIPAQUE IOHEXOL 300 MG/ML  SOLN COMPARISON:  None Available. FINDINGS: Bones/Joint/Cartilage Normal Ligaments Suboptimally assessed by CT. Muscles and Tendons Unremarkable Soft tissues There is a large soft tissue wound superficial to the left radius. There is no fluid collection. There is a small amount of subcutaneous gas but no gas tracking along fascial planes. IMPRESSION: Large soft tissue wound superficial to the left radius without fluid collection. Electronically Signed   By: Deatra Robinson M.D.   On: 02/27/2022 03:21   DG Forearm Right  Result Date: 02/26/2022 CLINICAL DATA:  Recent dog bite with local infection, initial encounter EXAM: RIGHT FOREARM - 2 VIEW COMPARISON:  None Available. FINDINGS: No acute fracture or dislocation is noted. No focal soft tissue abnormality is noted to correspond with the given clinical history. IMPRESSION: No acute bony abnormality noted. Electronically Signed   By: Alcide Clever M.D.   On: 02/26/2022 19:29   DG Foot Complete Right  Result Date: 02/26/2022 CLINICAL DATA:  infection.  Dog bite EXAM: RIGHT FOOT COMPLETE - 3+ VIEW COMPARISON:  None Available. FINDINGS: No cortical erosion or destruction.  There is no evidence of fracture or dislocation. There is no evidence of arthropathy or other focal bone abnormality. Plantar calcaneal spur. Dorsal forefoot/midfoot subcutaneus soft tissue edema. No retained radiopaque foreign body. IMPRESSION: 1. No radiographic findings to  suggest osteomyelitis. 2.  No acute displaced fracture or dislocation. 3. No retained radiopaque foreign body. Electronically Signed   By: Normajean Glasgow.D.  On: 02/26/2022 19:27   DG ELBOW COMPLETE LEFT (3+VIEW)  Result Date: 02/18/2022 CLINICAL DATA:  Left elbow pain. EXAM: LEFT ELBOW - COMPLETE 3+ VIEW COMPARISON:  Left forearm 02/12/2022. FINDINGS: A bandage with small radiopacities appears to be present over the distal forearm on one-view. No evidence of effusion. Mild degenerative change. Punctate loose body appears to be present over the anterior aspect of the elbow joint. No acute bony or joint abnormality identified. No evidence of acute fracture or dislocation. Radial head is intact. IMPRESSION: Mild degenerative change with punctate loose body. No evidence of acute fracture or dislocation. No evidence of effusion. Electronically Signed   By: Maisie Fus  Register M.D.   On: 02/18/2022 13:05   DG Wrist Complete Left  Result Date: 02/12/2022 CLINICAL DATA:  Left wrist pain after fall. EXAM: LEFT WRIST - COMPLETE 3+ VIEW COMPARISON:  None Available. FINDINGS: There is no evidence of fracture or dislocation. There is no evidence of arthropathy or other focal bone abnormality. Soft tissues are unremarkable. IMPRESSION: Negative. Electronically Signed   By: Lupita Raider M.D.   On: 02/12/2022 14:56   DG Forearm Left  Result Date: 02/12/2022 CLINICAL DATA:  Dog bite.  Fall. EXAM: LEFT FOREARM - 2 VIEW COMPARISON:  None Available. FINDINGS: There is no evidence of fracture or other focal bone lesions. No radiopaque foreign body is noted. Soft tissue gas is noted in the volar tissues consistent with dog bite. IMPRESSION: No  fracture or other bony abnormality is noted. Electronically Signed   By: Lupita Raider M.D.   On: 02/12/2022 14:54   DG Knee Complete 4 Views Right  Result Date: 02/12/2022 CLINICAL DATA:  Right knee pain after fall. EXAM: RIGHT KNEE - COMPLETE 4+ VIEW COMPARISON:  None Available. FINDINGS: No evidence of fracture, dislocation, or joint effusion. No evidence of arthropathy or other focal bone abnormality. Soft tissues are unremarkable. IMPRESSION: Negative. Electronically Signed   By: Lupita Raider M.D.   On: 02/12/2022 14:52   DG Foot Complete Right  Result Date: 02/12/2022 CLINICAL DATA:  Right foot pain after fall. EXAM: RIGHT FOOT COMPLETE - 3+ VIEW COMPARISON:  None Available. FINDINGS: There is no evidence of fracture or dislocation. There is no evidence of arthropathy or other focal bone abnormality. Soft tissues are unremarkable. IMPRESSION: Negative. Electronically Signed   By: Lupita Raider M.D.   On: 02/12/2022 14:51   DG Ankle Complete Right  Result Date: 02/12/2022 CLINICAL DATA:  Bilateral ankle pain after fall. EXAM: RIGHT ANKLE - COMPLETE 3+ VIEW COMPARISON:  None Available. FINDINGS: There is no evidence of fracture, dislocation, or joint effusion. There is no evidence of arthropathy or other focal bone abnormality. Soft tissues are unremarkable. IMPRESSION: Negative. Electronically Signed   By: Lupita Raider M.D.   On: 02/12/2022 14:50   DG Ankle Complete Left  Result Date: 02/12/2022 CLINICAL DATA:  Bilateral ankle pain after fall.  Dog bite. EXAM: LEFT ANKLE COMPLETE - 3+ VIEW COMPARISON:  None Available. FINDINGS: Extensive soft tissue abnormality is noted medially concerning for dog bite. Irregular cortical fracture is noted anteriorly in the distal tibia most consistent with dog bite. No radiopaque foreign body is noted. IMPRESSION: Irregular cortical fracture is noted anteriorly in the distal tibia most consistent with dog bite. Electronically Signed   By: Lupita Raider M.D.   On: 02/12/2022 14:49   DG Ribs Unilateral W/Chest Left  Result Date: 02/12/2022 CLINICAL DATA:  Fall. EXAM: LEFT RIBS  AND CHEST - 3+ VIEW COMPARISON:  June 21, 2018. FINDINGS: Mildly displaced left eighth rib fracture is noted. There is no evidence of pneumothorax or pleural effusion. Both lungs are clear. Heart size and mediastinal contours are within normal limits. IMPRESSION: Mildly displaced left eighth rib fracture. Electronically Signed   By: Lupita RaiderJames  Green Jr M.D.   On: 02/12/2022 14:46   CT Head Wo Contrast  Result Date: 02/12/2022 CLINICAL DATA:  Head trauma moderate to severe.  Fall.  Dog bite EXAM: CT HEAD WITHOUT CONTRAST TECHNIQUE: Contiguous axial images were obtained from the base of the skull through the vertex without intravenous contrast. RADIATION DOSE REDUCTION: This exam was performed according to the departmental dose-optimization program which includes automated exposure control, adjustment of the mA and/or kV according to patient size and/or use of iterative reconstruction technique. COMPARISON:  None Available. FINDINGS: Brain: No evidence of acute infarction, hemorrhage, hydrocephalus, extra-axial collection or mass lesion/mass effect. Vascular: Negative for hyperdense vessel Skull: Negative Sinuses/Orbits: Mild mucosal edema right ethmoid sinus. Remaining sinuses clear. Negative orbit Other: None IMPRESSION: No acute intracranial abnormality. Electronically Signed   By: Marlan Palauharles  Clark M.D.   On: 02/12/2022 13:47     Time coordinating discharge: Over 30 minutes    Jeanette Chamberavid Izabela Ow, MD  [facility] 02/28/2022, 5:14 PM

## 2022-03-02 ENCOUNTER — Encounter: Payer: BLUE CROSS/BLUE SHIELD | Admitting: Physical Medicine and Rehabilitation

## 2022-03-02 DIAGNOSIS — T148XXA Other injury of unspecified body region, initial encounter: Secondary | ICD-10-CM | POA: Diagnosis not present

## 2022-03-02 DIAGNOSIS — Z4889 Encounter for other specified surgical aftercare: Secondary | ICD-10-CM | POA: Diagnosis not present

## 2022-03-02 DIAGNOSIS — M79602 Pain in left arm: Secondary | ICD-10-CM | POA: Diagnosis not present

## 2022-03-02 DIAGNOSIS — S51852D Open bite of left forearm, subsequent encounter: Secondary | ICD-10-CM | POA: Diagnosis not present

## 2022-03-03 ENCOUNTER — Telehealth: Payer: Self-pay | Admitting: Family Medicine

## 2022-03-03 LAB — AEROBIC/ANAEROBIC CULTURE W GRAM STAIN (SURGICAL/DEEP WOUND)

## 2022-03-03 NOTE — Telephone Encounter (Signed)
Pt called stating she was recently released from the hospital following a dog bite and surgery from that and is wanting to know if she should resume taking her xarelto.  I advised pt she will also need a hosp f/up with PCP.  She tentatively made that appt for Mon 7/17 and will check with her husband to make sure that date/time will work for them.  In the interim,she wants to check on the xarelto as she has been told conflicting information from being in the hospital.

## 2022-03-03 NOTE — Telephone Encounter (Signed)
Called informed the patients husband of PCP's instructions. He stated she is seeing her surgeon on Thursday and can discuss at that time.

## 2022-03-04 LAB — CULTURE, BLOOD (ROUTINE X 2)
Culture: NO GROWTH
Culture: NO GROWTH
Special Requests: ADEQUATE
Special Requests: ADEQUATE

## 2022-03-05 DIAGNOSIS — Z4889 Encounter for other specified surgical aftercare: Secondary | ICD-10-CM | POA: Diagnosis not present

## 2022-03-05 DIAGNOSIS — M79602 Pain in left arm: Secondary | ICD-10-CM | POA: Diagnosis not present

## 2022-03-05 DIAGNOSIS — S51852D Open bite of left forearm, subsequent encounter: Secondary | ICD-10-CM | POA: Diagnosis not present

## 2022-03-05 DIAGNOSIS — T148XXA Other injury of unspecified body region, initial encounter: Secondary | ICD-10-CM | POA: Diagnosis not present

## 2022-03-08 ENCOUNTER — Other Ambulatory Visit: Payer: Self-pay | Admitting: Cardiovascular Disease

## 2022-03-09 ENCOUNTER — Inpatient Hospital Stay: Payer: BLUE CROSS/BLUE SHIELD | Admitting: Family Medicine

## 2022-03-09 DIAGNOSIS — T148XXA Other injury of unspecified body region, initial encounter: Secondary | ICD-10-CM | POA: Diagnosis not present

## 2022-03-09 DIAGNOSIS — S51852D Open bite of left forearm, subsequent encounter: Secondary | ICD-10-CM | POA: Diagnosis not present

## 2022-03-09 DIAGNOSIS — L089 Local infection of the skin and subcutaneous tissue, unspecified: Secondary | ICD-10-CM | POA: Diagnosis not present

## 2022-03-09 DIAGNOSIS — Z4889 Encounter for other specified surgical aftercare: Secondary | ICD-10-CM | POA: Diagnosis not present

## 2022-03-11 DIAGNOSIS — T148XXA Other injury of unspecified body region, initial encounter: Secondary | ICD-10-CM | POA: Diagnosis not present

## 2022-03-11 DIAGNOSIS — S51852D Open bite of left forearm, subsequent encounter: Secondary | ICD-10-CM | POA: Diagnosis not present

## 2022-03-11 DIAGNOSIS — M79602 Pain in left arm: Secondary | ICD-10-CM | POA: Diagnosis not present

## 2022-03-11 DIAGNOSIS — Z4889 Encounter for other specified surgical aftercare: Secondary | ICD-10-CM | POA: Diagnosis not present

## 2022-03-12 ENCOUNTER — Encounter: Payer: Self-pay | Admitting: Family Medicine

## 2022-03-12 ENCOUNTER — Telehealth (HOSPITAL_BASED_OUTPATIENT_CLINIC_OR_DEPARTMENT_OTHER): Payer: Self-pay

## 2022-03-12 ENCOUNTER — Ambulatory Visit: Payer: BLUE CROSS/BLUE SHIELD | Admitting: Family Medicine

## 2022-03-12 VITALS — BP 130/62 | HR 59 | Temp 97.8°F | Resp 16 | Wt 149.0 lb

## 2022-03-12 DIAGNOSIS — H6240 Otitis externa in other diseases classified elsewhere, unspecified ear: Secondary | ICD-10-CM

## 2022-03-12 DIAGNOSIS — Z1231 Encounter for screening mammogram for malignant neoplasm of breast: Secondary | ICD-10-CM | POA: Diagnosis not present

## 2022-03-12 DIAGNOSIS — Z1211 Encounter for screening for malignant neoplasm of colon: Secondary | ICD-10-CM | POA: Diagnosis not present

## 2022-03-12 DIAGNOSIS — Z0000 Encounter for general adult medical examination without abnormal findings: Secondary | ICD-10-CM | POA: Diagnosis not present

## 2022-03-12 DIAGNOSIS — B369 Superficial mycosis, unspecified: Secondary | ICD-10-CM

## 2022-03-12 MED ORDER — CLOTRIMAZOLE 1 % EX SOLN: 1.0000 | Freq: Two times a day (BID) | CUTANEOUS | 0 refills | Status: AC

## 2022-03-12 NOTE — Progress Notes (Signed)
Chief Complaint  Patient presents with   [facility] follow up after falling and betting bit by her dogs.      Well Woman [name] is here for a complete physical.   Her last physical was >1 year ago.  Current diet: in general, a "healthy" diet. Current exercise: none currently. Weight is stable and she denies fatigue out of ordinary. No LMP recorded. Patient has had a hysterectomy.  Seatbelt? Yes Advanced directive? No  Health Maintenance Mammogram- Due Colon cancer screening-Due Shingrix- No Tetanus- Yes Hep C screening- Yes HIV screening- Yes  Past Medical History:  Diagnosis Date   Back injury    Chronic   Bipolar disorder ([place])    Cardiomyopathy ([place])    hypertension controlled-helped with edema-Dr Nahser   Deafness    Left ear   Degenerative joint disease    with neck surgery   Depression    Diastolic dysfunction    with possible mild LVOT gradient   Dyspnea    Dysrhythmia    per patient, took too much naproxen which caused an "irregular heart beat"   Family history of adverse reaction to anesthesia    " my daughter has PONV and gets very grumpy"   Fibromyalgia    Former cigarette smoker    Gestational diabetes    H/O blood clots    due to side effect of medication   Headache(784.0)    migraines   Hypertension    Hypothyroidism    Leg cramps    Neuropathy    Pain    chronic    Pneumonia    PTSD (post-traumatic stress disorder)    Respiratory disorder    Schizophrenia ([place])      Past Surgical History:  Procedure Laterality Date   ABDOMINAL HYSTERECTOMY     partial   ANTERIOR CERVICAL DECOMP/DISCECTOMY FUSION N/A 01/30/2020   Procedure: Cervical Three-FourAnterior cervical decompression/discectomy/fusion with Hardware Removal.;  Surgeon: Kristeen Miss, MD;  Location: [place];  Service: Neurosurgery;  Laterality: N/A;  anterior   APPENDECTOMY     CARDIAC CATHETERIZATION     Ejection Fraction 65-70%   CERVICAL DISC ARTHROPLASTY  N/A 02/21/2016   Procedure: Cervical three-four artificial disc replacement;  Surgeon: Kristeen Miss, MD;  Location: [facility];  Service: Neurosurgery;  Laterality: N/A;   CERVICAL FUSION     C5-6   EXTERNAL EAR SURGERY     tumor at age 13, 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   HERNIA REPAIR     " as a baby"   I & D EXTREMITY Left 02/27/2022   Procedure: IRRIGATION AND DEBRIDEMENT FOREARM;  Surgeon: Leanora Cover, MD;  Location: [facility];  Service: Orthopedics;  Laterality: Left;   NECK SURGERY     OTHER SURGICAL HISTORY     History of cervical and lumbar disk surgery   PERCUTANEOUS PINNING Left 12/01/2019   Procedure: Closed reduction percutaneous pinning left ring finger proximal phalanx fracture;  Surgeon: Cindra Presume, MD;  Location: [facility];  Service: Plastics;  Laterality: Left;   polypectomies     tubes   POSTERIOR CERVICAL LAMINECTOMY WITH MET- RX Left 06/13/2018   Procedure: Left Cervical three-four Laminotomy/foraminotomy;  Surgeon: Kristeen Miss, MD;  Location: [facility];  Service: Neurosurgery;  Laterality: Left;   TONSILLECTOMY     TONSILLECTOMY AND ADENOIDECTOMY      Medications  Current Outpatient Medications on File Prior to Visit  Medication Sig Dispense Refill   acetaminophen (TYLENOL) 500 MG  tablet Take 1,000 mg by mouth every 6 (six) hours as needed for mild pain or headache.      ALPRAZolam (XANAX) 1 MG tablet TAKE 1 TABLET BY MOUTH TWICE A DAY AS NEEDED FOR ANXIETY (Patient taking differently: Take 1 mg by mouth in the morning and at bedtime.) 60 tablet 2   bisacodyl (DULCOLAX) 5 MG EC tablet Take 5 mg by mouth 2 (two) times daily as needed for moderate constipation.      busPIRone (BUSPAR) 15 MG tablet Take 1 tablet (15 mg total) by mouth 3 (three) times daily. 270 tablet 3   ciprofloxacin (CILOXAN) 0.3 % ophthalmic solution 4 drops See admin instructions. Instill to right ear 2 times daily as needed for ear infection     citalopram (CELEXA) 20 MG tablet Take 1 tablet (20 mg total) by  mouth daily. 90 tablet 3   dexamethasone (DECADRON) 0.1 % ophthalmic solution 2 drops See admin instructions. Instill into right ear as needed for ear infection     hydrochlorothiazide (HYDRODIURIL) 25 MG tablet Take 1 tablet (25 mg total) by mouth daily. APPOINTMENT NEEDED FOR FURTHER REFILLS CALL [phone] (Patient taking differently: Take 25 mg by mouth daily.) 15 tablet 0   hydrOXYzine (ATARAX/VISTARIL) 25 MG tablet TAKE 3 TO 4 TABLETS BY MOUTH EVERY 8 HOURS AS NEEDED FOR ANXIETY. MAX AMOUNT PER INSURANCE (Patient taking differently: Take 25 mg by mouth daily.) 360 tablet 2   levothyroxine (SYNTHROID) 50 MCG tablet TAKE 1 TABLET BY MOUTH EVERY DAY (Patient not taking: Reported on 02/27/2022) 90 tablet 0   loratadine (CLARITIN) 10 MG tablet Take 10 mg by mouth 2 (two) times daily as needed for allergies (for seasonal allergies).     meclizine (ANTIVERT) 25 MG tablet Take 1 tablet (25 mg total) by mouth 3 (three) times daily as needed for dizziness. 30 tablet 3   metoprolol tartrate (LOPRESSOR) 25 MG tablet TAKE 1 TABLET BY MOUTH TWICE A DAY 60 tablet 0   naproxen (NAPROSYN) 500 MG tablet TAKE 1 TABLET DAILY AS NEEDED FOR PAIN. USE SPARINGLY. (Patient taking differently: Take 500 mg by mouth daily. noon) 30 tablet 0   orphenadrine (NORFLEX) 100 MG tablet Take 1 tablet (100 mg total) by mouth 2 (two) times daily as needed for muscle spasms. (Patient taking differently: Take 100 mg by mouth at bedtime.) 60 tablet 5   risperiDONE (RISPERDAL) 1 MG tablet TAKE 1 TABLET BY MOUTH EVERYDAY AT BEDTIME (Patient taking differently: Take 1 mg by mouth at bedtime.) 90 tablet 3   rivaroxaban (XARELTO) 20 MG TABS tablet Take 1 tablet (20 mg total) by mouth daily with supper. 30 tablet 3   tiZANidine (ZANAFLEX) 4 MG tablet TAKE 1 & 1/2 TO 2 (ONE & ONE-HALF TO TWO) TABLETS BY MOUTH THREE TIMES DAILY (Patient taking differently: Take 6-8 mg by mouth 3 (three) times daily as needed for muscle spasms.) 180 tablet 5     Allergies Allergies  Allergen Reactions   Morphine And Related Other (See Comments)    "Didn't agree with me/wasn't very effective.."   Oxycodone    Lyrica [Pregabalin] Rash   Neurontin [Gabapentin] Other (See Comments)    Left patient sleepy all the time    Review of Systems: Constitutional:  no unexpected weight changes Eye:  no recent significant change in vision Ear/Nose/Mouth/Throat:  Ears:  no recent change in hearing Nose/Mouth/Throat:  no complaints of nasal congestion, no sore throat Cardiovascular: no chest pain Respiratory:  no shortness of breath  Gastrointestinal:  +diarrhea from abx GU:  Female: negative for dysuria or pelvic pain Musculoskeletal/Extremities:  no pain of the joints Integumentary (Skin/Breast): +wound on LUE Neurologic:  no headaches Endocrine:  denies fatigue  Exam BP 130/62 (BP Location: Right Arm, Patient Position: Sitting, Cuff Size: Small)   Pulse (!) 59   Temp 97.8 F (36.6 C) (Oral)   Resp 16   Wt 149 lb (67.6 kg)   SpO2 100%   BMI 24.05 kg/m  General:  well developed, well nourished, in no apparent distress Skin: Excoriated medial left lower extremity wound without surrounding erythema or excessive warmth/fluctuance; left forearm with dressing and sleeve over it Head:  no masses, lesions, or tenderness Eyes:  pupils equal and round, sclera anicteric without injection Ears:  canal with fungal spores noted on the right, TMs shiny without retraction, no obvious effusion, no erythema Nose:  nares patent, septum midline, mucosa normal, and no drainage or sinus tenderness Throat/Pharynx:  lips and gingiva without lesion; tongue and uvula midline; non-inflamed pharynx; no exudates or postnasal drainage Neck: neck supple without adenopathy, thyromegaly, or masses Lungs:  clear to auscultation, breath sounds equal bilaterally, no respiratory distress Cardio:  regular rate and rhythm, no LE edema Abdomen:  abdomen soft, nontender; bowel  sounds normal; no masses or organomegaly Genital: Defer to GYN Musculoskeletal:  symmetrical muscle groups noted without atrophy or deformity Extremities:  no clubbing, cyanosis, or edema, no deformities, no skin discoloration Neuro:  gait normal; deep tendon reflexes normal and symmetric Psych: well oriented with normal range of affect and appropriate judgment/insight  Assessment and Plan  Well adult exam - Plan: CBC, Comprehensive metabolic panel, Lipid panel  Screen for colon cancer - Plan: Ambulatory referral to Gastroenterology  Encounter for screening mammogram for malignant neoplasm of breast - Plan: MM DIGITAL SCREENING BILATERAL  Fungal ear infection - Plan: clotrimazole (LOTRIMIN) 1 % external solution   Well 61 y.o. female. Counseled on diet and exercise. Advanced directive form requested today.  CCS: Refer GI Shingrix rec'd.  Calcium and vitamin D recommended. Refer for mammogram.  The patient has fungus in her right ear canal.  We will send in 2 weeks of clotrimazole solution to use twice daily.  Put a cotton ball inside ear canal for 15 minutes after use. Other orders as above. Follow up in 6 mo or prn. The patient voiced understanding and agreement to the plan.  [place], DO 03/12/22 12:01 PM

## 2022-03-12 NOTE — Patient Instructions (Addendum)
Give [place] 2-3 business days to get the results of your labs back.   Keep the diet clean and stay active.  Take 1200 mg of calcium daily and at least 1000 units of vitamin D3 daily.   If you do not hear anything about your referral in the next 1-2 weeks, call our office and ask for an update.  The Shingrix vaccine (for shingles) is a 2 shot series spaced 2-6 months apart. It can make people feel low energy, achy and almost like they have the flu for 48 hours after injection. 1/5 people can have nausea and/or vomiting. Please plan accordingly when deciding on when to get this shot. Call our office for a nurse visit appointment to get this. The second shot of the series is less severe regarding the side effects, but it still lasts 48 hours.   Let us know if you need anything.

## 2022-03-13 ENCOUNTER — Other Ambulatory Visit: Payer: Self-pay | Admitting: Family Medicine

## 2022-03-16 DIAGNOSIS — S51852D Open bite of left forearm, subsequent encounter: Secondary | ICD-10-CM | POA: Diagnosis not present

## 2022-03-16 DIAGNOSIS — T148XXA Other injury of unspecified body region, initial encounter: Secondary | ICD-10-CM | POA: Diagnosis not present

## 2022-03-16 DIAGNOSIS — Z4889 Encounter for other specified surgical aftercare: Secondary | ICD-10-CM | POA: Diagnosis not present

## 2022-03-16 DIAGNOSIS — M79602 Pain in left arm: Secondary | ICD-10-CM | POA: Diagnosis not present

## 2022-03-17 ENCOUNTER — Other Ambulatory Visit (HOSPITAL_BASED_OUTPATIENT_CLINIC_OR_DEPARTMENT_OTHER): Payer: Self-pay

## 2022-03-19 ENCOUNTER — Telehealth: Payer: Self-pay | Admitting: Family Medicine

## 2022-03-19 DIAGNOSIS — Z4889 Encounter for other specified surgical aftercare: Secondary | ICD-10-CM | POA: Diagnosis not present

## 2022-03-19 DIAGNOSIS — S51852D Open bite of left forearm, subsequent encounter: Secondary | ICD-10-CM | POA: Diagnosis not present

## 2022-03-19 DIAGNOSIS — T148XXA Other injury of unspecified body region, initial encounter: Secondary | ICD-10-CM | POA: Diagnosis not present

## 2022-03-19 DIAGNOSIS — M79602 Pain in left arm: Secondary | ICD-10-CM | POA: Diagnosis not present

## 2022-03-19 NOTE — Telephone Encounter (Signed)
Patient called to find out where she is supposed to apply solution she received. The instructions say topically but the fungal infection is in her ear. She needs clarity. Please call to advise. [phone]

## 2022-03-19 NOTE — Telephone Encounter (Signed)
Called the patient informed of PCP instructions 

## 2022-03-19 NOTE — Telephone Encounter (Signed)
Does not look like you prescribed?

## 2022-03-23 DIAGNOSIS — S51852D Open bite of left forearm, subsequent encounter: Secondary | ICD-10-CM | POA: Diagnosis not present

## 2022-03-23 DIAGNOSIS — M79602 Pain in left arm: Secondary | ICD-10-CM | POA: Diagnosis not present

## 2022-03-23 DIAGNOSIS — T148XXA Other injury of unspecified body region, initial encounter: Secondary | ICD-10-CM | POA: Diagnosis not present

## 2022-03-23 DIAGNOSIS — Z4889 Encounter for other specified surgical aftercare: Secondary | ICD-10-CM | POA: Diagnosis not present

## 2022-03-26 ENCOUNTER — Other Ambulatory Visit (HOSPITAL_COMMUNITY): Payer: Self-pay

## 2022-03-26 DIAGNOSIS — M79602 Pain in left arm: Secondary | ICD-10-CM | POA: Diagnosis not present

## 2022-03-26 DIAGNOSIS — S51852D Open bite of left forearm, subsequent encounter: Secondary | ICD-10-CM | POA: Diagnosis not present

## 2022-03-26 DIAGNOSIS — T148XXA Other injury of unspecified body region, initial encounter: Secondary | ICD-10-CM | POA: Diagnosis not present

## 2022-03-26 DIAGNOSIS — Z4889 Encounter for other specified surgical aftercare: Secondary | ICD-10-CM | POA: Diagnosis not present

## 2022-03-29 ENCOUNTER — Encounter: Payer: Self-pay | Admitting: Cardiovascular Disease

## 2022-03-29 NOTE — Progress Notes (Signed)
This encounter was created in error - please disregard.

## 2022-03-30 DIAGNOSIS — M79602 Pain in left arm: Secondary | ICD-10-CM | POA: Diagnosis not present

## 2022-03-30 DIAGNOSIS — T148XXA Other injury of unspecified body region, initial encounter: Secondary | ICD-10-CM | POA: Diagnosis not present

## 2022-03-30 DIAGNOSIS — S51852D Open bite of left forearm, subsequent encounter: Secondary | ICD-10-CM | POA: Diagnosis not present

## 2022-03-30 DIAGNOSIS — Z4889 Encounter for other specified surgical aftercare: Secondary | ICD-10-CM | POA: Diagnosis not present

## 2022-03-31 ENCOUNTER — Encounter: Payer: BLUE CROSS/BLUE SHIELD | Admitting: Cardiovascular Disease

## 2022-04-01 ENCOUNTER — Other Ambulatory Visit: Payer: Self-pay | Admitting: Cardiovascular Disease

## 2022-04-02 DIAGNOSIS — S51852D Open bite of left forearm, subsequent encounter: Secondary | ICD-10-CM | POA: Diagnosis not present

## 2022-04-02 DIAGNOSIS — Z4889 Encounter for other specified surgical aftercare: Secondary | ICD-10-CM | POA: Diagnosis not present

## 2022-04-02 DIAGNOSIS — T148XXA Other injury of unspecified body region, initial encounter: Secondary | ICD-10-CM | POA: Diagnosis not present

## 2022-04-02 DIAGNOSIS — M79602 Pain in left arm: Secondary | ICD-10-CM | POA: Diagnosis not present

## 2022-04-02 LAB — FUNGUS CULTURE RESULT

## 2022-04-02 LAB — FUNGAL ORGANISM REFLEX

## 2022-04-02 LAB — FUNGUS CULTURE WITH STAIN

## 2022-04-06 DIAGNOSIS — M79602 Pain in left arm: Secondary | ICD-10-CM | POA: Diagnosis not present

## 2022-04-06 DIAGNOSIS — T148XXA Other injury of unspecified body region, initial encounter: Secondary | ICD-10-CM | POA: Diagnosis not present

## 2022-04-06 DIAGNOSIS — Z4889 Encounter for other specified surgical aftercare: Secondary | ICD-10-CM | POA: Diagnosis not present

## 2022-04-06 DIAGNOSIS — S51852D Open bite of left forearm, subsequent encounter: Secondary | ICD-10-CM | POA: Diagnosis not present

## 2022-04-12 ENCOUNTER — Other Ambulatory Visit: Payer: Self-pay | Admitting: Family Medicine

## 2022-04-13 ENCOUNTER — Encounter: Payer: Self-pay | Admitting: Physical Medicine and Rehabilitation

## 2022-04-13 ENCOUNTER — Encounter
Payer: BLUE CROSS/BLUE SHIELD | Attending: Physical Medicine and Rehabilitation | Admitting: Physical Medicine and Rehabilitation

## 2022-04-13 VITALS — BP 182/95 | HR 74 | Ht 66.0 in | Wt 143.0 lb

## 2022-04-13 DIAGNOSIS — M7918 Myalgia, other site: Secondary | ICD-10-CM | POA: Insufficient documentation

## 2022-04-13 DIAGNOSIS — M797 Fibromyalgia: Secondary | ICD-10-CM | POA: Insufficient documentation

## 2022-04-13 MED ORDER — LIDOCAINE HCL 1 % IJ SOLN: 6.0000 mL | Freq: Once | INTRAMUSCULAR | Status: AC

## 2022-04-13 NOTE — Progress Notes (Signed)
Patient is a 61 yr old female with hx of multiple spinal surgeries- all surgeries on neck and neck and back pain, fibromyalgia, and previous L carpal tunnel surgery Here for trigger point injections and f/u on chronic pain.    No falls except on dog fall/blood.   Has been in bed for 7 weeks- was in dog fight- slipped on blood.   Had ot put both dogs down.  Had stitches in bottom of feet; and hole in L arm "size of avocado".  Has also been in w/c due to foot issues.   PT has been "hell"- now "graduated to bandaid".  Was getting hydrotherapy- done now with hydrotherapy.   Has gained some strength and had to learn to bring cup to mouth again. Done with PT and OT.   Shoulders feel like someone standing on shoulders this AM. Hasn't felt that in long time. .  Today 7/10- little better.    Exam: Scarring on L forearm Scab L medial malleolus Scarring on bottom of feet B/L   Plan: Con't Zanaflex 6-8 mg as needed for muscle spasms.   2. Patient here for trigger point injections for  Consent done and on chart.  Cleaned areas with alcohol and injected using a 27 gauge 1.5 inch needle  Injected  5.5 cc- wasted 0.5 cc Using 1% Lidocaine with no EPI  Upper traps B/L  Levators- B/L  Posterior scalenes Middle scalenes- B/L  Splenius Capitus Pectoralis Major- pt doesn't want Rhomboids B/L  Infraspinatus Teres Major/minor Thoracic paraspinals Lumbar paraspinals Other injections- cervical paraspinals- B/L    Patient's level of pain prior was 7/10 Current level of pain after injections is 7/10  There was no bleeding or complications.  Patient was advised to drink a lot of water on day after injections to flush system Will have increased soreness for 12-48 hours after injections.  Can use Lidocaine patches the day AFTER injections Can use theracane on day of injections in places didn't inject Can use heating pad 4-6 hours AFTER injections     3. F/u in 6 weeks for trigger point  injections.

## 2022-04-13 NOTE — Patient Instructions (Signed)
Plan: Con't Zanaflex 6-8 mg as needed for muscle spasms.   2. Patient here for trigger point injections for  Consent done and on chart.  Cleaned areas with alcohol and injected using a 27 gauge 1.5 inch needle  Injected  2.5 cc- wasted 0.5 cc Using 1% Lidocaine with no EPI  Upper traps B/L  Levators- B/L  Posterior scalenes Middle scalenes- B/L  Splenius Capitus Pectoralis Major- pt doesn't want Rhomboids B/L  Infraspinatus Teres Major/minor Thoracic paraspinals Lumbar paraspinals Other injections- cervical paraspinals- B/L    Patient's level of pain prior was 7/10 Current level of pain after injections is 7/10  There was no bleeding or complications.  Patient was advised to drink a lot of water on day after injections to flush system Will have increased soreness for 12-48 hours after injections.  Can use Lidocaine patches the day AFTER injections Can use theracane on day of injections in places didn't inject Can use heating pad 4-6 hours AFTER injections     3. F/u in 6 weeks for trigger point injections.

## 2022-04-30 ENCOUNTER — Encounter: Payer: Self-pay | Admitting: Adult Health

## 2022-04-30 ENCOUNTER — Ambulatory Visit (INDEPENDENT_AMBULATORY_CARE_PROVIDER_SITE_OTHER): Payer: BLUE CROSS/BLUE SHIELD | Admitting: Adult Health

## 2022-04-30 DIAGNOSIS — F3181 Bipolar II disorder: Secondary | ICD-10-CM

## 2022-04-30 DIAGNOSIS — F411 Generalized anxiety disorder: Secondary | ICD-10-CM

## 2022-04-30 DIAGNOSIS — T148XXA Other injury of unspecified body region, initial encounter: Secondary | ICD-10-CM | POA: Diagnosis not present

## 2022-04-30 DIAGNOSIS — L089 Local infection of the skin and subcutaneous tissue, unspecified: Secondary | ICD-10-CM | POA: Diagnosis not present

## 2022-04-30 DIAGNOSIS — Z4889 Encounter for other specified surgical aftercare: Secondary | ICD-10-CM | POA: Diagnosis not present

## 2022-04-30 DIAGNOSIS — F4310 Post-traumatic stress disorder, unspecified: Secondary | ICD-10-CM

## 2022-04-30 DIAGNOSIS — F331 Major depressive disorder, recurrent, moderate: Secondary | ICD-10-CM

## 2022-04-30 DIAGNOSIS — S51852D Open bite of left forearm, subsequent encounter: Secondary | ICD-10-CM | POA: Diagnosis not present

## 2022-04-30 MED ORDER — BUSPIRONE HCL 15 MG PO TABS: 15.0000 mg | ORAL_TABLET | Freq: Three times a day (TID) | ORAL | 3 refills | Status: DC

## 2022-04-30 MED ORDER — RISPERIDONE 1 MG PO TABS: ORAL_TABLET | ORAL | 3 refills | Status: DC

## 2022-04-30 MED ORDER — CITALOPRAM HYDROBROMIDE 20 MG PO TABS: 20.0000 mg | ORAL_TABLET | Freq: Every day | ORAL | 3 refills | Status: DC

## 2022-04-30 MED ORDER — ALPRAZOLAM 1 MG PO TABS: 1.0000 mg | ORAL_TABLET | Freq: Two times a day (BID) | ORAL | 2 refills | Status: DC

## 2022-04-30 NOTE — Progress Notes (Signed)
[name] [id number] 11-09-1960 61 y.o.  Subjective:   Patient ID:  [name] is a 61 y.o. (DOB [birth year]) female.  Chief Complaint: No chief complaint on file.   HPI PIYA MESCH presents to the office today for follow-up of MDD, GAD, PTSD, BPD 2.   Describes mood today as "so-so". Pleasant. Denies tearfulness. Mood symptoms - reports depression, anxiety, and irritability. Mood is lower. Stating "I'm not doing as well as I was". Reports a dog attack - her dogs - 9 weeks ago and had to be hospitalized with multiple injuries. Dogs were surrendered to animal control. She is now at home and still recovering. Stating "it was all very traumatic". Chronic pain issues - working with pain management. She and husband doing well. Stable interest and motivation. Taking medications as prescribed.  Energy levels lower. Active, does not have a regular exercise routine.  Enjoys some usual interests and activities. Married. Lives with husband of 40 years. Talking to children - 1 son and 1 daughter. Spending time with family. Appetite adequate. Weight loss - 138 from 145 pounds. Sleeps better some nights than others. Averages 5 to 6 hours. Napping during the day. Focus and concentration stable. Completing tasks. Managing aspects of household. Disabled. Denies SI or HI.  Denies AH or VH.  Previous medications - Abilify, Wellbutrin, Gabapentin, Lyrica, Prozac     PHQ2-9    Flowsheet Row Office Visit from 04/13/2022 in [facility] Physical Medicine and Rehabilitation Office Visit from 01/26/2022 in [facility] Physical Medicine and Rehabilitation Office Visit from 12/22/2021 in [facility] Physical Medicine and Rehabilitation Office Visit from 11/03/2021 in [facility] Physical Medicine and Rehabilitation Office Visit from 09/22/2021 in [facility] Physical Medicine and Rehabilitation  PHQ-2 Total Score 0 2 0 0 0      Flowsheet Row ED to Hosp-Admission (Discharged) from 02/26/2022 in [facility] 4E  CV SURGICAL PROGRESSIVE CARE ED from 02/12/2022 in [facility] EMERGENCY DEPARTMENT  C-SSRS RISK CATEGORY No Risk No Risk        Review of Systems:  Review of Systems  Musculoskeletal:  Negative for gait problem.  Neurological:  Negative for tremors.  Psychiatric/Behavioral:         Please refer to HPI    Medications: I have reviewed the patient's current medications.  Current Outpatient Medications  Medication Sig Dispense Refill   acetaminophen (TYLENOL) 500 MG tablet Take 1,000 mg by mouth every 6 (six) hours as needed for mild pain or headache.      ALPRAZolam (XANAX) 1 MG tablet Take 1 tablet (1 mg total) by mouth in the morning and at bedtime. 60 tablet 2   bisacodyl (DULCOLAX) 5 MG EC tablet Take 5 mg by mouth 2 (two) times daily as needed for moderate constipation.      busPIRone (BUSPAR) 15 MG tablet Take 1 tablet (15 mg total) by mouth 3 (three) times daily. 270 tablet 3   ciprofloxacin (CILOXAN) 0.3 % ophthalmic solution 4 drops See admin instructions. Instill to right ear 2 times daily as needed for ear infection     citalopram (CELEXA) 20 MG tablet Take 1 tablet (20 mg total) by mouth daily. 90 tablet 3   dexamethasone (DECADRON) 0.1 % ophthalmic solution 2 drops See admin instructions. Instill into right ear as needed for ear infection     hydrochlorothiazide (HYDRODIURIL) 25 MG tablet Take 1 tablet (25 mg total) by mouth daily. APPOINTMENT NEEDED FOR FURTHER REFILLS CALL [phone] (Patient taking differently: Take  25 mg by mouth daily.) 15 tablet 0   hydrOXYzine (ATARAX/VISTARIL) 25 MG tablet TAKE 3 TO 4 TABLETS BY MOUTH EVERY 8 HOURS AS NEEDED FOR ANXIETY. MAX AMOUNT PER INSURANCE (Patient taking differently: Take 25 mg by mouth daily.) 360 tablet 2   levothyroxine (SYNTHROID) 50 MCG tablet TAKE 1 TABLET BY MOUTH EVERY DAY 90 tablet 0   loratadine (CLARITIN) 10 MG tablet Take 10 mg by mouth 2 (two) times daily as needed for allergies (for seasonal allergies).      meclizine (ANTIVERT) 25 MG tablet Take 1 tablet (25 mg total) by mouth 3 (three) times daily as needed for dizziness. 30 tablet 3   metoprolol tartrate (LOPRESSOR) 25 MG tablet TAKE 1 TABLET BY MOUTH TWICE A DAY 180 tablet 3   naproxen (NAPROSYN) 500 MG tablet TAKE 1 TABLET DAILY AS NEEDED FOR PAIN. USE SPARINGLY. 30 tablet 0   orphenadrine (NORFLEX) 100 MG tablet Take 1 tablet (100 mg total) by mouth 2 (two) times daily as needed for muscle spasms. (Patient taking differently: Take 100 mg by mouth at bedtime.) 60 tablet 5   risperiDONE (RISPERDAL) 1 MG tablet TAKE 1 TABLET BY MOUTH EVERYDAY AT BEDTIME 90 tablet 3   rivaroxaban (XARELTO) 20 MG TABS tablet Take 1 tablet (20 mg total) by mouth daily with supper. 30 tablet 3   tiZANidine (ZANAFLEX) 4 MG tablet TAKE 1 & 1/2 TO 2 (ONE & ONE-HALF TO TWO) TABLETS BY MOUTH THREE TIMES DAILY (Patient taking differently: Take 6-8 mg by mouth 3 (three) times daily as needed for muscle spasms.) 180 tablet 5   Current Facility-Administered Medications  Medication Dose Route Frequency Provider Last Rate Last Admin   lidocaine (XYLOCAINE) 1 % (with pres) injection 6 mL  6 mL Other Once Lovorn, Megan, MD        Medication Side Effects: None  Allergies:  Allergies  Allergen Reactions   Morphine And Related Other (See Comments)    "Didn't agree with me/wasn't very effective.."   Oxycodone    Lyrica [Pregabalin] Rash   Neurontin [Gabapentin] Other (See Comments)    Left patient sleepy all the time    Past Medical History:  Diagnosis Date   Back injury    Chronic   Bipolar disorder (HCC)    Cardiomyopathy (HCC)    hypertension controlled-helped with edema-Dr Nahser   Deafness    Left ear   Degenerative joint disease    with neck surgery   Depression    Diastolic dysfunction    with possible mild LVOT gradient   Dyspnea    Dysrhythmia    per patient, took too much naproxen which caused an "irregular heart beat"   Family history of adverse  reaction to anesthesia    " my daughter has PONV and gets very grumpy"   Fibromyalgia    Former cigarette smoker    Gestational diabetes    H/O blood clots    due to side effect of medication   Headache(784.0)    migraines   Hypertension    Hypothyroidism    Leg cramps    Neuropathy    Pain    chronic    Pneumonia    PTSD (post-traumatic stress disorder)    Respiratory disorder    Schizophrenia (HCC)     Past Medical History, Surgical history, Social history, and Family history were reviewed and updated as appropriate.   Please see review of systems for further details on the patient's review from today.  Objective:   Physical Exam:  There were no vitals taken for this visit.  Physical Exam Constitutional:      General: She is not in acute distress. Musculoskeletal:        General: No deformity.  Neurological:     Mental Status: She is alert and oriented to person, place, and time.     Coordination: Coordination normal.  Psychiatric:        Attention and Perception: Attention and perception normal. She does not perceive auditory or visual hallucinations.        Mood and Affect: Mood normal. Mood is not anxious or depressed. Affect is not labile, blunt, angry or inappropriate.        Speech: Speech normal.        Behavior: Behavior normal.        Thought Content: Thought content normal. Thought content is not paranoid or delusional. Thought content does not include homicidal or suicidal ideation. Thought content does not include homicidal or suicidal plan.        Cognition and Memory: Cognition and memory normal.        Judgment: Judgment normal.     Comments: Insight intact     Lab Review:     Component Value Date/Time   NA 133 (L) 02/28/2022 0113   K 4.1 02/28/2022 0113   CL 101 02/28/2022 0113   CO2 24 02/28/2022 0113   GLUCOSE 136 (H) 02/28/2022 0113   BUN 5 (L) 02/28/2022 0113   CREATININE 0.69 02/28/2022 0113   CREATININE 0.85 01/19/2020 1507    CALCIUM 8.5 (L) 02/28/2022 0113   PROT 6.0 (L) 02/27/2022 0507   ALBUMIN 3.0 (L) 02/27/2022 0507   AST 12 (L) 02/27/2022 0507   ALT 14 02/27/2022 0507   ALKPHOS 68 02/27/2022 0507   BILITOT 0.6 02/27/2022 0507   GFRNONAA >60 02/28/2022 0113   GFRAA >60 01/26/2020 0839       Component Value Date/Time   WBC 5.9 02/28/2022 0113   RBC 3.19 (L) 02/28/2022 0113   HGB 10.8 (L) 02/28/2022 0113   HCT 31.7 (L) 02/28/2022 0113   PLT 434 (H) 02/28/2022 0113   MCV 99.4 02/28/2022 0113   MCH 33.9 02/28/2022 0113   MCHC 34.1 02/28/2022 0113   RDW 13.6 02/28/2022 0113   LYMPHSABS 0.5 (L) 02/28/2022 0113   MONOABS 0.2 02/28/2022 0113   EOSABS 0.0 02/28/2022 0113   BASOSABS 0.0 02/28/2022 0113    No results found for: "POCLITH", "LITHIUM"   No results found for: "PHENYTOIN", "PHENOBARB", "VALPROATE", "CBMZ"   .res Assessment: Plan:    Plan:  Buspar 15mg  TID Xanax 1mg  BID  Celexa 20mg  daily Risperdal 1mg  at bedtime   6 months  Therapy -  Patient advised to contact office with any questions, adverse effects, or acute worsening in signs and symptoms.  Discussed potential benefits, risk, and side effects of benzodiazepines to include potential risk of tolerance and dependence, as well as possible drowsiness.  Advised patient not to drive if experiencing drowsiness and to take lowest possible effective dose to minimize risk of dependence and tolerance.  Discussed potential metabolic side effects asso Diagnoses and all orders for this visit:  GAD (generalized anxiety disorder) -     ALPRAZolam (XANAX) 1 MG tablet; Take 1 tablet (1 mg total) by mouth in the morning and at bedtime. -     busPIRone (BUSPAR) 15 MG tablet; Take 1 tablet (15 mg total) by mouth 3 (three) times daily.  Major depressive disorder, recurrent episode, moderate (HCC) -     citalopram (CELEXA) 20 MG tablet; Take 1 tablet (20 mg total) by mouth daily. -     risperiDONE (RISPERDAL) 1 MG tablet; TAKE 1  TABLET BY MOUTH EVERYDAY AT BEDTIME  Bipolar II disorder (HCC) -     risperiDONE (RISPERDAL) 1 MG tablet; TAKE 1 TABLET BY MOUTH EVERYDAY AT BEDTIME  PTSD (post-traumatic stress disorder) -     risperiDONE (RISPERDAL) 1 MG tablet; TAKE 1 TABLET BY MOUTH EVERYDAY AT BEDTIME     Please see After Visit Summary for patient specific instructions.  Future Appointments  Date Time Provider Department Center  05/25/2022  3:00 PM Genice Rouge, MD [facility]  07/24/2022  2:20 PM Genice Rouge, MD [facility]  09/14/2022  4:00 PM Sharlene Dory, DO [facility]  10/29/2022  5:20 PM Daianna Vasques, Thereasa Solo, NP [facility] None    No orders of the defined types were placed in this encounter.   -------------------------------

## 2022-05-10 ENCOUNTER — Other Ambulatory Visit: Payer: Self-pay | Admitting: Family Medicine

## 2022-05-11 ENCOUNTER — Other Ambulatory Visit: Payer: Self-pay | Admitting: Family Medicine

## 2022-05-25 ENCOUNTER — Encounter: Payer: BLUE CROSS/BLUE SHIELD | Admitting: Physical Medicine and Rehabilitation

## 2022-06-01 ENCOUNTER — Other Ambulatory Visit: Payer: Self-pay | Admitting: Physical Medicine and Rehabilitation

## 2022-06-07 ENCOUNTER — Other Ambulatory Visit: Payer: Self-pay | Admitting: Family Medicine

## 2022-07-03 ENCOUNTER — Other Ambulatory Visit: Payer: Self-pay | Admitting: Physical Medicine and Rehabilitation

## 2022-07-06 ENCOUNTER — Other Ambulatory Visit: Payer: Self-pay | Admitting: Family Medicine

## 2022-07-24 ENCOUNTER — Ambulatory Visit: Payer: BLUE CROSS/BLUE SHIELD | Admitting: Physical Medicine and Rehabilitation

## 2022-07-31 DIAGNOSIS — H7012 Chronic mastoiditis, left ear: Secondary | ICD-10-CM | POA: Diagnosis not present

## 2022-07-31 DIAGNOSIS — Z9089 Acquired absence of other organs: Secondary | ICD-10-CM | POA: Diagnosis not present

## 2022-07-31 DIAGNOSIS — H906 Mixed conductive and sensorineural hearing loss, bilateral: Secondary | ICD-10-CM | POA: Diagnosis not present

## 2022-08-03 ENCOUNTER — Other Ambulatory Visit: Payer: Self-pay | Admitting: Adult Health

## 2022-08-03 ENCOUNTER — Other Ambulatory Visit: Payer: Self-pay | Admitting: Family Medicine

## 2022-08-03 DIAGNOSIS — F411 Generalized anxiety disorder: Secondary | ICD-10-CM

## 2022-08-06 NOTE — Telephone Encounter (Signed)
Pt  called and wanted to know why her xanax had not been sent in yet. She is out.

## 2022-08-07 ENCOUNTER — Other Ambulatory Visit: Payer: Self-pay | Admitting: Physical Medicine and Rehabilitation

## 2022-08-07 NOTE — Telephone Encounter (Signed)
Patient needs refill or Norflex

## 2022-08-28 DIAGNOSIS — H7291 Unspecified perforation of tympanic membrane, right ear: Secondary | ICD-10-CM | POA: Diagnosis not present

## 2022-08-28 DIAGNOSIS — H906 Mixed conductive and sensorineural hearing loss, bilateral: Secondary | ICD-10-CM | POA: Diagnosis not present

## 2022-08-31 ENCOUNTER — Encounter: Payer: Self-pay | Admitting: Physical Medicine and Rehabilitation

## 2022-08-31 ENCOUNTER — Encounter
Payer: BLUE CROSS/BLUE SHIELD | Attending: Physical Medicine and Rehabilitation | Admitting: Physical Medicine and Rehabilitation

## 2022-08-31 VITALS — BP 111/71 | HR 62 | Temp 97.6°F | Ht 66.0 in | Wt 151.0 lb

## 2022-08-31 DIAGNOSIS — M7918 Myalgia, other site: Secondary | ICD-10-CM

## 2022-08-31 MED ORDER — LIDOCAINE HCL 1 % IJ SOLN: 6.0000 mL | Freq: Once | INTRAMUSCULAR | Status: AC

## 2022-08-31 NOTE — Progress Notes (Signed)
Patient is a 62 yr old female with hx of multiple spinal surgeries- all surgeries on neck and neck and back pain, fibromyalgia, and previous L carpal tunnel surgery Here for trigger point injections and f/u on chronic pain.        Considering hasn't had shots in months,  Has a lot of pressure in shoulders- like someone standing on shoulders No Falls  Just aches and pains, esp from colder weather.  Just starting to feel everything.    Section of head just goes numb- like it's associated with trigger points.   Ready for trigger point injections   Plan:  Patient here for trigger point injections for  Consent done and on chart.  Cleaned areas with alcohol and injected using a 27 gauge 1.5 inch needle  Injected 3cc Using 1% Lidocaine with no EPI  Upper traps B/L  Levators B/L  Posterior scalenes R only Middle scalenes B/L  Splenius Capitus- didn't want Pectoralis Major- doesn't want Rhomboids B/L and cervical paraspinals B/L  Infraspinatus Teres Major/minor Thoracic paraspinals Lumbar paraspinals Other injections-    Patient's level of pain prior was 9/10 Current level of pain after injections is down to 8/10- and going downwards- more relaxed  There was no bleeding or complications.  Patient was advised to drink a lot of water on day after injections to flush system Will have increased soreness for 12-48 hours after injections.  Can use Lidocaine patches the day AFTER injections Can use theracane on day of injections in places didn't inject Can use heating pad 4-6 hours AFTER injections  2. Wants to increase zanaflex/tizanidine- but husband doesn't want her to.    3. F/U in 6 weeks for trigger point injections.

## 2022-08-31 NOTE — Patient Instructions (Signed)
Plan:  Patient here for trigger point injections for  Consent done and on chart.  Cleaned areas with alcohol and injected using a 27 gauge 1.5 inch needle  Injected 3cc Using 1% Lidocaine with no EPI  Upper traps B/L  Levators B/L  Posterior scalenes R only Middle scalenes B/L  Splenius Capitus- didn't want Pectoralis Major- doesn't want Rhomboids B/L and cervical paraspinals B/L  Infraspinatus Teres Major/minor Thoracic paraspinals Lumbar paraspinals Other injections-    Patient's level of pain prior was 9/10 Current level of pain after injections is down to 8/10- and going downwards- more relaxed  There was no bleeding or complications.  Patient was advised to drink a lot of water on day after injections to flush system Will have increased soreness for 12-48 hours after injections.  Can use Lidocaine patches the day AFTER injections Can use theracane on day of injections in places didn't inject Can use heating pad 4-6 hours AFTER injections  2. Wants to increase zanaflex/tizanidine- but husband doesn't want her to.    3. F/U in 6 weeks for trigger point injections.

## 2022-09-03 ENCOUNTER — Other Ambulatory Visit: Payer: Self-pay | Admitting: Family Medicine

## 2022-09-14 ENCOUNTER — Ambulatory Visit: Payer: BLUE CROSS/BLUE SHIELD | Admitting: Family Medicine

## 2022-10-01 ENCOUNTER — Other Ambulatory Visit: Payer: Self-pay | Admitting: Family Medicine

## 2022-10-29 ENCOUNTER — Other Ambulatory Visit: Payer: Self-pay | Admitting: Adult Health

## 2022-10-29 ENCOUNTER — Other Ambulatory Visit: Payer: Self-pay | Admitting: Family Medicine

## 2022-10-29 ENCOUNTER — Ambulatory Visit (INDEPENDENT_AMBULATORY_CARE_PROVIDER_SITE_OTHER): Payer: Self-pay | Admitting: Adult Health

## 2022-10-29 DIAGNOSIS — F489 Nonpsychotic mental disorder, unspecified: Secondary | ICD-10-CM

## 2022-10-29 DIAGNOSIS — F411 Generalized anxiety disorder: Secondary | ICD-10-CM

## 2022-10-29 NOTE — Progress Notes (Signed)
Patient no show appointment. ? ?

## 2022-10-29 NOTE — Telephone Encounter (Signed)
Please call to schedule an appt, was a no show today, due this month.

## 2022-10-30 NOTE — Telephone Encounter (Signed)
Next visit is 11/25/22. Requesting refill on Xanax called to:  [id number] [place]  Phone: [phone] Fax: [phone]

## 2022-10-30 NOTE — Telephone Encounter (Signed)
Lvm for patient to call and schedule 

## 2022-11-09 ENCOUNTER — Encounter
Payer: BLUE CROSS/BLUE SHIELD | Attending: Physical Medicine and Rehabilitation | Admitting: Physical Medicine and Rehabilitation

## 2022-11-09 ENCOUNTER — Encounter: Payer: Self-pay | Admitting: Physical Medicine and Rehabilitation

## 2022-11-09 VITALS — BP 110/67 | HR 60 | Ht 66.0 in | Wt 156.4 lb

## 2022-11-09 DIAGNOSIS — M5412 Radiculopathy, cervical region: Secondary | ICD-10-CM | POA: Insufficient documentation

## 2022-11-09 DIAGNOSIS — M7918 Myalgia, other site: Secondary | ICD-10-CM | POA: Insufficient documentation

## 2022-11-09 DIAGNOSIS — M797 Fibromyalgia: Secondary | ICD-10-CM | POA: Diagnosis not present

## 2022-11-09 MED ORDER — TIZANIDINE HCL 4 MG PO TABS: 8.0000 mg | ORAL_TABLET | Freq: Three times a day (TID) | ORAL | 5 refills | Status: DC

## 2022-11-09 MED ORDER — LIDOCAINE HCL 1 % IJ SOLN
6.0000 mL | Freq: Once | INTRAMUSCULAR | Status: AC
  Administered 2022-11-09: 6 mL via INTRADERMAL

## 2022-11-09 MED ORDER — BACLOFEN 10 MG PO TABS: 10.0000 mg | ORAL_TABLET | Freq: Three times a day (TID) | ORAL | 5 refills | Status: DC

## 2022-11-09 NOTE — Progress Notes (Signed)
Patient is a 62 yr old female with hx of multiple spinal surgeries- all surgeries on neck and neck and back pain, fibromyalgia, and previous L carpal tunnel surgery Here for trigger point injections and f/u on chronic pain.              Having a LOT of muscle spasms in neck, back, legs, etc- really in agony.   Taking Zanaflex and Magnesium- not enough- usually takes 6 mg, but sometimes takes 8 mg.  Made it to garden - hasn't done much- started 2 weeks ago.  Doing a little bit- 8 hours to vacation one way this past weekday.    Plan: Use tennis ball in car and at home- back of thighs, buttocks, back and back of calves. 2-10 minutes on each trigger point/sore area.   2. Can increase Zanaflex to 8 mg 3x/day every time- take 2 tabs 3x/day.    3. Baclofen 10 mg 3x/day either as needed OR scheduled- for muscle spasms- can take WITH Zanaflex.   4. Patient here for trigger point injections for  Consent done and on chart.  Cleaned areas with alcohol and injected using a 27 gauge 1.5 inch needle  Injected 6cc- none wasted Using 1% Lidocaine with no EPI  Upper traps- B/L  Levators- B/L  Posterior scalenes Middle scalenes- B/L  Splenius Capitus Pectoralis Major Rhomboids- B/L x2 Infraspinatus Teres Major/minor Thoracic paraspinals- B/L  Lumbar paraspinals- B/L  Other injections-    Patient's level of pain prior was 9/10- horrible today Current level of pain after injections is down to 8/10  There was no bleeding or complications.  Patient was advised to drink a lot of water on day after injections to flush system Will have increased soreness for 12-48 hours after injections.  Can use Lidocaine patches the day AFTER injections Can use theracane on day of injections in places didn't inject Can use heating pad 4-6 hours AFTER injections   5. F/U in 2 months- trP injections   I spent a total of  24  minutes on total care today- >50% coordination of care- due to 8 minutes on  injections- 16 minutes discussing muscle spasms and how ot treat- as detailed above.

## 2022-11-09 NOTE — Patient Instructions (Signed)
Plan: Use tennis ball in car and at home- back of thighs, buttocks, back and back of calves. 2-10 minutes on each trigger point/sore area.   2. Can increase Zanaflex to 8 mg 3x/day every time- take 2 tabs 3x/day.    3. Baclofen 10 mg 3x/day either as needed OR scheduled- for muscle spasms- can take WITH Zanaflex.   4. Patient here for trigger point injections for  Consent done and on chart.  Cleaned areas with alcohol and injected using a 27 gauge 1.5 inch needle  Injected 6cc- none wasted Using 1% Lidocaine with no EPI  Upper traps- B/L  Levators- B/L  Posterior scalenes Middle scalenes- B/L  Splenius Capitus Pectoralis Major Rhomboids- B/L x2 Infraspinatus Teres Major/minor Thoracic paraspinals- B/L  Lumbar paraspinals- B/L  Other injections-    Patient's level of pain prior was 9/10- horrible today Current level of pain after injections is down to 8/10  There was no bleeding or complications.  Patient was advised to drink a lot of water on day after injections to flush system Will have increased soreness for 12-48 hours after injections.  Can use Lidocaine patches the day AFTER injections Can use theracane on day of injections in places didn't inject Can use heating pad 4-6 hours AFTER injections   5. F/U in 2 months- trP injections

## 2022-11-25 ENCOUNTER — Ambulatory Visit (INDEPENDENT_AMBULATORY_CARE_PROVIDER_SITE_OTHER): Payer: BLUE CROSS/BLUE SHIELD | Admitting: Adult Health

## 2022-11-25 ENCOUNTER — Encounter: Payer: Self-pay | Admitting: Adult Health

## 2022-11-25 DIAGNOSIS — F3181 Bipolar II disorder: Secondary | ICD-10-CM | POA: Diagnosis not present

## 2022-11-25 DIAGNOSIS — F411 Generalized anxiety disorder: Secondary | ICD-10-CM | POA: Diagnosis not present

## 2022-11-25 DIAGNOSIS — F4310 Post-traumatic stress disorder, unspecified: Secondary | ICD-10-CM

## 2022-11-25 MED ORDER — ALPRAZOLAM 1 MG PO TABS: 1.0000 mg | ORAL_TABLET | Freq: Two times a day (BID) | ORAL | 2 refills | Status: DC

## 2022-11-25 NOTE — Progress Notes (Signed)
[name] RC:[id number] 1960/12/16 62 y.o.  Subjective:   Patient ID:  [name] is a 62 y.o. (DOB Oct 14, 1960) female.  Chief Complaint: No chief complaint on file.   HPI [name] presents to the office today for follow-up of GAD, PTSD, BPD 2.   Describes mood today as "so-so". Pleasant. Denies tearfulness. Mood symptoms - reports depression, anxiety, and irritability. Mood is lower. Stating "I'm not doing as well as I was". Reports a dog attack - her dogs - 9 weeks ago and had to be hospitalized with multiple injuries. Dogs were surrendered to animal control. She is now at home and still recovering. Stating "it was all very traumatic". Chronic pain issues - working with pain management. She and husband doing well. Stable interest and motivation. Taking medications as prescribed.  Energy levels lower. Active, does not have a regular exercise routine.  Enjoys some usual interests and activities. Married. Lives with husband of 53 years. Talking to children - 1 son and 1 daughter. Spending time with family. Appetite adequate. Weight loss - 138 from 145 pounds. Sleeps better some nights than others. Averages 5 to 6 hours. Napping during the day. Focus and concentration stable. Completing tasks. Managing aspects of household. Disabled. Denies SI or HI.  Denies AH or VH.  Previous medications - Abilify, Wellbutrin, Gabapentin, Lyrica, Prozac     PHQ2-9    Flowsheet Row Office Visit from 11/09/2022 in [place] Office Visit from 08/31/2022 in [place] Visit from 04/13/2022 in [place] Visit from 01/26/2022 in [place] Visit from 12/22/2021 in [place]  PHQ-2 Total Score 0 2 0 2 0      Flowsheet Row ED to Hosp-Admission (Discharged) from 02/26/2022 in [facility] [place] ED from 02/12/2022 in [facility] Emergency Department at [place] No Risk No Risk        Review of Systems:  Review of Systems  Musculoskeletal:  Negative for gait problem.  Neurological:  Negative for tremors.  Psychiatric/Behavioral:         Please refer to HPI    Medications: I have reviewed the patient's current medications.  Current Outpatient Medications  Medication Sig Dispense Refill   acetaminophen (TYLENOL) 500 MG tablet Take 1,000 mg by mouth every 6 (six) hours as needed for mild pain or headache.      ALPRAZolam (XANAX) 1 MG tablet TAKE 1 TABLET (1 MG TOTAL) BY MOUTH IN THE MORNING AND AT BEDTIME. 60 tablet 0   baclofen (LIORESAL) 10 MG tablet Take 1 tablet (10 mg total) by mouth 3 (three) times daily. With Zanaflex- for muscle spasms 90 each 5   bisacodyl (DULCOLAX) 5 MG EC tablet Take 5 mg by mouth 2 (two) times daily as needed for moderate constipation.      busPIRone (BUSPAR) 15 MG tablet Take 1 tablet (15 mg total) by mouth 3 (three) times daily. 270 tablet 3   ciprofloxacin (CILOXAN) 0.3 % ophthalmic solution 4 drops See admin instructions. Instill to right ear 2 times daily as needed for ear infection     citalopram (CELEXA) 20 MG tablet Take 1 tablet (20 mg total) by mouth daily. 90 tablet 3   dexamethasone (DECADRON) 0.1 % ophthalmic solution 2 drops See admin instructions. Instill into right ear as needed for ear infection  hydrochlorothiazide (HYDRODIURIL) 25 MG tablet Take 1 tablet (25 mg total) by mouth daily. APPOINTMENT NEEDED FOR FURTHER REFILLS CALL [phone] (Patient taking differently: Take 25 mg by mouth daily.) 15 tablet 0   hydrOXYzine (ATARAX/VISTARIL) 25 MG tablet TAKE 3 TO 4 TABLETS BY MOUTH EVERY 8 HOURS AS NEEDED FOR ANXIETY. MAX AMOUNT PER INSURANCE (Patient taking differently: Take 25 mg by mouth daily.) 360 tablet 2   levothyroxine (SYNTHROID) 50 MCG tablet TAKE 1 TABLET BY MOUTH EVERY DAY  90 tablet 0   loratadine (CLARITIN) 10 MG tablet Take 10 mg by mouth 2 (two) times daily as needed for allergies (for seasonal allergies).     meclizine (ANTIVERT) 25 MG tablet Take 1 tablet (25 mg total) by mouth 3 (three) times daily as needed for dizziness. 30 tablet 3   metoprolol tartrate (LOPRESSOR) 25 MG tablet TAKE 1 TABLET BY MOUTH TWICE A DAY 180 tablet 3   naproxen (NAPROSYN) 500 MG tablet TAKE 1 TABLET DAILY AS NEEDED FOR PAIN. USE SPARINGLY. 30 tablet 0   orphenadrine (NORFLEX) 100 MG tablet TAKE 1 TABLET (100 MG TOTAL) BY MOUTH TWICE A DAY AS NEEDED FOR MUSCLE SPASM 60 tablet 5   risperiDONE (RISPERDAL) 1 MG tablet TAKE 1 TABLET BY MOUTH EVERYDAY AT BEDTIME 90 tablet 3   tiZANidine (ZANAFLEX) 4 MG tablet Take 2 tablets (8 mg total) by mouth 3 (three) times daily. TAKE ONE AND ONE HALF TO TWO TABLETS BY MOUTH THREE TIMES DAILY 180 tablet 5   XARELTO 20 MG TABS tablet TAKE 1 TABLET BY MOUTH DAILY WITH SUPPER. 30 tablet 3   Current Facility-Administered Medications  Medication Dose Route Frequency Provider Last Rate Last Admin   lidocaine (XYLOCAINE) 1 % (with pres) injection 6 mL  6 mL Other Once Lovorn, Megan, MD       lidocaine (XYLOCAINE) 1 % (with pres) injection 6 mL  6 mL Other Once Lovorn, Megan, MD        Medication Side Effects: None  Allergies:  Allergies  Allergen Reactions   Morphine And Related Other (See Comments)    "Didn't agree with me/wasn't very effective.."   Oxycodone    Lyrica [Pregabalin] Rash   Neurontin [Gabapentin] Other (See Comments)    Left patient sleepy all the time    Past Medical History:  Diagnosis Date   Back injury    Chronic   Bipolar disorder    Cardiomyopathy    hypertension controlled-helped with edema-Dr Nahser   Deafness    Left ear   Degenerative joint disease    with neck surgery   Depression    Diastolic dysfunction    with possible mild LVOT gradient   Dyspnea    Dysrhythmia    per patient, took too much naproxen  which caused an "irregular heart beat"   Family history of adverse reaction to anesthesia    " my daughter has PONV and gets very grumpy"   Fibromyalgia    Former cigarette smoker    Gestational diabetes    H/O blood clots    due to side effect of medication   Headache(784.0)    migraines   Hypertension    Hypothyroidism    Leg cramps    Neuropathy    Pain    chronic    Pneumonia    PTSD (post-traumatic stress disorder)    Respiratory disorder    Schizophrenia     Past Medical History, Surgical history, Social history, and Family history were reviewed  and updated as appropriate.   Please see review of systems for further details on the patient's review from today.   Objective:   Physical Exam:  There were no vitals taken for this visit.  Physical Exam  Lab Review:     Component Value Date/Time   NA 133 (L) 02/28/2022 0113   K 4.1 02/28/2022 0113   CL 101 02/28/2022 0113   CO2 24 02/28/2022 0113   GLUCOSE 136 (H) 02/28/2022 0113   BUN 5 (L) 02/28/2022 0113   CREATININE 0.69 02/28/2022 0113   CREATININE 0.85 01/19/2020 1507   CALCIUM 8.5 (L) 02/28/2022 0113   PROT 6.0 (L) 02/27/2022 0507   ALBUMIN 3.0 (L) 02/27/2022 0507   AST 12 (L) 02/27/2022 0507   ALT 14 02/27/2022 0507   ALKPHOS 68 02/27/2022 0507   BILITOT 0.6 02/27/2022 0507   GFRNONAA >60 02/28/2022 0113   GFRAA >60 01/26/2020 0839       Component Value Date/Time   WBC 5.9 02/28/2022 0113   RBC 3.19 (L) 02/28/2022 0113   HGB 10.8 (L) 02/28/2022 0113   HCT 31.7 (L) 02/28/2022 0113   PLT 434 (H) 02/28/2022 0113   MCV 99.4 02/28/2022 0113   MCH 33.9 02/28/2022 0113   MCHC 34.1 02/28/2022 0113   RDW 13.6 02/28/2022 0113   LYMPHSABS 0.5 (L) 02/28/2022 0113   MONOABS 0.2 02/28/2022 0113   EOSABS 0.0 02/28/2022 0113   BASOSABS 0.0 02/28/2022 0113    No results found for: "POCLITH", "LITHIUM"   No results found for: "PHENYTOIN", "PHENOBARB", "VALPROATE", "CBMZ"   .res Assessment: Plan:     Plan:  Buspar 15mg  TID Xanax 1mg  BID  Celexa 20mg  daily Risperdal 1mg  at bedtime   6 months  Therapy - Rinaldo Cloud  Patient advised to contact office with any questions, adverse effects, or acute worsening in signs and symptoms.  Discussed potential benefits, risk, and side effects of benzodiazepines to include potential risk of tolerance and dependence, as well as possible drowsiness.  Advised patient not to drive if experiencing drowsiness and to take lowest possible effective dose to minimize risk of dependence and tolerance.  Discussed potential metabolic side effects associated with atypical antipsychotics, as well as potential risk for movement side effects. Advised pt to contact office if movement side effects occur.     There are no diagnoses linked to this encounter.   Please see After Visit Summary for patient specific instructions.  Future Appointments  Date Time Provider [place]  01/27/2023 11:00 AM Lovorn, Jinny Blossom, MD [facility]    No orders of the defined types were placed in this encounter.   -------------------------------

## 2022-11-26 ENCOUNTER — Other Ambulatory Visit: Payer: Self-pay | Admitting: Family Medicine

## 2022-12-09 ENCOUNTER — Encounter: Payer: Self-pay | Admitting: *Deleted

## 2022-12-27 ENCOUNTER — Other Ambulatory Visit: Payer: Self-pay | Admitting: Family Medicine

## 2023-01-08 DIAGNOSIS — H906 Mixed conductive and sensorineural hearing loss, bilateral: Secondary | ICD-10-CM | POA: Diagnosis not present

## 2023-01-24 ENCOUNTER — Other Ambulatory Visit: Payer: Self-pay | Admitting: Family Medicine

## 2023-01-25 NOTE — Telephone Encounter (Signed)
Ok to refill or does she need an appt? Last OV 03/12/22

## 2023-01-26 DIAGNOSIS — H906 Mixed conductive and sensorineural hearing loss, bilateral: Secondary | ICD-10-CM | POA: Diagnosis not present

## 2023-01-27 ENCOUNTER — Encounter: Payer: BLUE CROSS/BLUE SHIELD | Admitting: Physical Medicine and Rehabilitation

## 2023-02-13 ENCOUNTER — Other Ambulatory Visit: Payer: Self-pay | Admitting: Family Medicine

## 2023-02-15 ENCOUNTER — Encounter: Payer: Self-pay | Admitting: Physical Medicine and Rehabilitation

## 2023-02-15 ENCOUNTER — Encounter
Payer: BLUE CROSS/BLUE SHIELD | Attending: Physical Medicine and Rehabilitation | Admitting: Physical Medicine and Rehabilitation

## 2023-02-15 VITALS — BP 156/73 | HR 73 | Ht 66.0 in | Wt 156.0 lb

## 2023-02-15 DIAGNOSIS — S91351S Open bite, right foot, sequela: Secondary | ICD-10-CM

## 2023-02-15 DIAGNOSIS — G8929 Other chronic pain: Secondary | ICD-10-CM | POA: Diagnosis not present

## 2023-02-15 DIAGNOSIS — M542 Cervicalgia: Secondary | ICD-10-CM | POA: Insufficient documentation

## 2023-02-15 DIAGNOSIS — M797 Fibromyalgia: Secondary | ICD-10-CM | POA: Insufficient documentation

## 2023-02-15 DIAGNOSIS — W540XXS Bitten by dog, sequela: Secondary | ICD-10-CM | POA: Diagnosis not present

## 2023-02-15 DIAGNOSIS — M5412 Radiculopathy, cervical region: Secondary | ICD-10-CM | POA: Diagnosis not present

## 2023-02-15 DIAGNOSIS — M7918 Myalgia, other site: Secondary | ICD-10-CM | POA: Diagnosis not present

## 2023-02-15 MED ORDER — LIDOCAINE HCL 1 % IJ SOLN
3.0000 mL | Freq: Once | INTRAMUSCULAR | Status: AC
Start: 2023-02-15 — End: 2023-02-15
  Administered 2023-02-15: 3 mL

## 2023-02-15 NOTE — Addendum Note (Signed)
Addended by: Silas Sacramento T on: 02/15/2023 03:56 PM   Modules accepted: Orders

## 2023-02-15 NOTE — Progress Notes (Signed)
Patient is a 62 yr old female with hx of multiple spinal surgeries- all surgeries on neck and neck and back pain, fibromyalgia, and previous L carpal tunnel surgery- also hx of bipolarII d/o;  Here for trigger point injections and f/u on chronic pain.              Beat lately- cold and heat are both awful for pt.   Feels like she's a big balloon that will explode.   Legs and hips hurting-  Still hard time walking on R foot- scar tissue falls off and then grows back.  Looks like large callus- hurts like "hell"-  Hard to walk-  Since on arch of foot, but has pretty flat feet- so it touches the ground in this area and very painful  Also feet swelling by the end of night- R>>L  More pain on R side of neck than before- used to be more on L side and now moe on R side.   Taking Zanaflex 8 mg TID Along with Baclofen 10 mg 3x/day- It beginning made her really sleepy- but got used to it, so not sleepy anymore.   Getting hearing aids.  Thought was getting more clots in legs so is on Xarelto- doesn't want to go to another doctor.   Can only handle grocery store or going to home dept, dinner and then cannot go anymore- has to take a nap.    Exam: Awake, alert, very HOH; no hearing aids; NAD Hoarse COPD cough when laughing Trigger points in upper traps, levators, scaelenes and rhomboids and pecs   Plan: Con't Baclofen  10 mg 3x/day- has refills  2. Con't Tizanidine 8 mg 3x/day- d/w pt how to take and won't increase for now.   3. Not taking Orphedrine- stopped it.   4. Patient here for trigger point injections for  Consent done and on chart.  Cleaned areas with alcohol and injected using a 27 gauge 1.5 inch needle  Injected 3cc- none wasted Using 1% Lidocaine with no EPI  Upper traps B/L  Levators- B/L  Posterior scalenes Middle scalenes- B/L  Splenius Capitus Pectoralis Major Rhomboids- B/L  Infraspinatus Teres Major/minor Thoracic paraspinals Lumbar paraspinals Other  injections-    Patient's level of pain prior was Current level of pain after injections is  There was no bleeding or complications.  Patient was advised to drink a lot of water on day after injections to flush system Will have increased soreness for 12-48 hours after injections.  Can use Lidocaine patches the day AFTER injections Can use theracane on day of injections in places didn't inject Can use heating pad 4-6 hours AFTER injections  5. Needs to stop smoking- starting to sound more like COPD.   6. F/U in 6 weeks.Trp injections.

## 2023-02-15 NOTE — Patient Instructions (Signed)
Plan: Con't Baclofen  10 mg 3x/day- has refills  2. Con't Tizanidine 8 mg 3x/day-   3. Not taking Orphedrine- stopped it.   4. Patient here for trigger point injections for  Consent done and on chart.  Cleaned areas with alcohol and injected using a 27 gauge 1.5 inch needle  Injected 3cc- none wasted Using 1% Lidocaine with no EPI  Upper traps B/L  Levators- B/L  Posterior scalenes Middle scalenes- B/L  Splenius Capitus Pectoralis Major Rhomboids- B/L  Infraspinatus Teres Major/minor Thoracic paraspinals Lumbar paraspinals Other injections-    Patient's level of pain prior was Current level of pain after injections is  There was no bleeding or complications.  Patient was advised to drink a lot of water on day after injections to flush system Will have increased soreness for 12-48 hours after injections.  Can use Lidocaine patches the day AFTER injections Can use theracane on day of injections in places didn't inject Can use heating pad 4-6 hours AFTER injections  5. Needs to stop smoking- starting to sound more like COPD.   6. F/U in 6 weeks.Trp injections.

## 2023-02-23 ENCOUNTER — Other Ambulatory Visit: Payer: Self-pay | Admitting: Adult Health

## 2023-02-23 DIAGNOSIS — F411 Generalized anxiety disorder: Secondary | ICD-10-CM

## 2023-02-23 NOTE — Telephone Encounter (Signed)
Pt called requesting RF Alprazolam 1 mg. [facility] [place] Apt [facility]

## 2023-03-08 ENCOUNTER — Other Ambulatory Visit: Payer: Self-pay | Admitting: Family Medicine

## 2023-03-08 DIAGNOSIS — Z1211 Encounter for screening for malignant neoplasm of colon: Secondary | ICD-10-CM

## 2023-03-09 ENCOUNTER — Ambulatory Visit: Payer: BLUE CROSS/BLUE SHIELD | Admitting: Family Medicine

## 2023-03-09 ENCOUNTER — Ambulatory Visit (HOSPITAL_BASED_OUTPATIENT_CLINIC_OR_DEPARTMENT_OTHER)
Admission: RE | Admit: 2023-03-09 | Discharge: 2023-03-09 | Disposition: A | Discharge status: Home / Self Care | Payer: BLUE CROSS/BLUE SHIELD | Source: Ambulatory Visit | Attending: Family Medicine | Admitting: Family Medicine

## 2023-03-09 ENCOUNTER — Encounter: Payer: Self-pay | Admitting: Family Medicine

## 2023-03-09 VITALS — BP 120/80 | HR 61 | Temp 97.7°F | Ht 66.0 in | Wt 156.5 lb

## 2023-03-09 DIAGNOSIS — M79641 Pain in right hand: Secondary | ICD-10-CM

## 2023-03-09 DIAGNOSIS — M19041 Primary osteoarthritis, right hand: Secondary | ICD-10-CM | POA: Diagnosis not present

## 2023-03-09 DIAGNOSIS — S62334A Displaced fracture of neck of fourth metacarpal bone, right hand, initial encounter for closed fracture: Secondary | ICD-10-CM | POA: Diagnosis not present

## 2023-03-09 DIAGNOSIS — S62614A Displaced fracture of proximal phalanx of right ring finger, initial encounter for closed fracture: Secondary | ICD-10-CM | POA: Diagnosis not present

## 2023-03-09 MED ORDER — CLOTRIMAZOLE-BETAMETHASONE 1-0.05 % EX CREA: 1.0000 | TOPICAL_CREAM | Freq: Every day | CUTANEOUS | 0 refills | Status: DC

## 2023-03-09 MED ORDER — HYDROCHLOROTHIAZIDE 25 MG PO TABS: 25.0000 mg | ORAL_TABLET | Freq: Every day | ORAL | 1 refills | Status: DC

## 2023-03-09 MED ORDER — OXYCODONE HCL 5 MG PO TABS
5.0000 mg | ORAL_TABLET | ORAL | 0 refills | Status: DC | PRN
Start: 2023-03-09 — End: 2023-03-12

## 2023-03-09 NOTE — Progress Notes (Signed)
Musculoskeletal Exam  Patient: [name] DOB: [birth year]  DOS: 03/09/2023  SUBJECTIVE:  Chief Complaint:   Chief Complaint  Patient presents with   Hand Injury    BRITINY DEFRAIN is a 62 y.o.  female for evaluation and treatment of R hand pain.   Onset:  2 days ago. FOOSH Location: R hand, 4th digit Character:  aching  Progression of issue:  is unchanged Associated symptoms: swelling, bruising, decreased ROM No redness Treatment: to date has been ice, OTC NSAIDS, and acetaminophen.   Neurovascular symptoms: no  Past Medical History:  Diagnosis Date   Back injury    Chronic   Bipolar disorder (HCC)    Cardiomyopathy (HCC)    hypertension controlled-helped with edema-Dr Nahser   Deafness    Left ear   Degenerative joint disease    with neck surgery   Depression    Diastolic dysfunction    with possible mild LVOT gradient   Dyspnea    Dysrhythmia    per patient, took too much naproxen which caused an "irregular heart beat"   Family history of adverse reaction to anesthesia    " my daughter has PONV and gets very grumpy"   Fibromyalgia    Former cigarette smoker    Gestational diabetes    H/O blood clots    due to side effect of medication   Headache(784.0)    migraines   Hypertension    Hypothyroidism    Leg cramps    Neuropathy    Pain    chronic    Pneumonia    PTSD (post-traumatic stress disorder)    Respiratory disorder    Schizophrenia (HCC)     Objective: VITAL SIGNS: BP 120/80 (BP Location: Left Arm, Patient Position: Sitting, Cuff Size: Normal)   Pulse 61   Temp 97.7 F (36.5 C) (Oral)   Ht 5\' 6"  (1.676 m)   Wt 156 lb 8 oz (71 kg)   SpO2 98%   BMI 25.26 kg/m  Constitutional: Well formed, well developed. No acute distress. Thorax & Lungs: No accessory muscle use Musculoskeletal: R hand.   Normal active range of motion: no.   Normal passive range of motion: no Tenderness to palpation: yes over distal 4th MC and prox 4th  phalanx Deformity: soft tissue swelling noted Ecchymosis: yes Neurologic: Normal sensory function. No focal deficits noted. DTR's equal and symmetric in LE's. No clonus. Psychiatric: Normal mood. Age appropriate judgment and insight. Alert & oriented x 3.         Assessment:  Right hand pain - Plan: DG Hand Complete Right, oxyCODONE (ROXICODONE) 5 MG immediate release tablet  Plan: Check x-ray of the right hand downstairs.  I did warn her about the facility fee and she verbalized understanding and willingness to get it downstairs anyway.  Oxycodone as needed, warnings about this provided.  Ice, Tylenol.  F/u as originally scheduled. The patient voiced understanding and agreement to the plan.   Jilda Roche [place], DO 03/09/23  12:10 PM

## 2023-03-09 NOTE — Patient Instructions (Addendum)
OK to take Tylenol 1000 mg (2 extra strength tabs) or 975 mg (3 regular strength tabs) every 6 hours as needed.  Ice/cold pack over area for 10-15 min twice daily.  Do not drink alcohol, do any illicit/street drugs, drive or do anything that requires alertness while on this medicine.   We will be in touch regarding your X-ray results.   Let us know if you need anything.

## 2023-03-10 DIAGNOSIS — H906 Mixed conductive and sensorineural hearing loss, bilateral: Secondary | ICD-10-CM | POA: Diagnosis not present

## 2023-03-10 DIAGNOSIS — H9212 Otorrhea, left ear: Secondary | ICD-10-CM | POA: Diagnosis not present

## 2023-03-10 DIAGNOSIS — H938X1 Other specified disorders of right ear: Secondary | ICD-10-CM | POA: Diagnosis not present

## 2023-03-10 DIAGNOSIS — H7293 Unspecified perforation of tympanic membrane, bilateral: Secondary | ICD-10-CM | POA: Diagnosis not present

## 2023-03-11 ENCOUNTER — Telehealth: Payer: Self-pay | Admitting: Family Medicine

## 2023-03-11 NOTE — Telephone Encounter (Signed)
Patient called to get her results from the xray for her hand. Advised radiologist hasn't read it yet. Please follow up with imaging and call pt to advise

## 2023-03-12 ENCOUNTER — Telehealth: Payer: Self-pay | Admitting: Family Medicine

## 2023-03-12 ENCOUNTER — Other Ambulatory Visit: Payer: Self-pay | Admitting: Family Medicine

## 2023-03-12 DIAGNOSIS — M79641 Pain in right hand: Secondary | ICD-10-CM

## 2023-03-12 DIAGNOSIS — S62645B Nondisplaced fracture of proximal phalanx of left ring finger, initial encounter for open fracture: Secondary | ICD-10-CM

## 2023-03-12 MED ORDER — OXYCODONE HCL 5 MG PO TABS
5.0000 mg | ORAL_TABLET | Freq: Four times a day (QID) | ORAL | 0 refills | Status: DC | PRN
Start: 2023-03-12 — End: 2023-03-18

## 2023-03-12 NOTE — Telephone Encounter (Signed)
Patient called and would like a callback regarding xray results of finger please call back.

## 2023-03-12 NOTE — Telephone Encounter (Signed)
Patient informed Should referral be urgent///and diagnosis

## 2023-03-12 NOTE — Telephone Encounter (Signed)
Please refer to ortho hand, looks like she broke her finger. Radiology has not read yet. Plz inform pt. Ty.

## 2023-03-12 NOTE — Addendum Note (Signed)
Addended by: Scharlene Gloss B on: 03/12/2023 11:37 AM   Modules accepted: Orders

## 2023-03-15 ENCOUNTER — Encounter: Payer: Self-pay | Admitting: Orthopaedic Surgery

## 2023-03-15 ENCOUNTER — Ambulatory Visit: Payer: BLUE CROSS/BLUE SHIELD | Admitting: Orthopaedic Surgery

## 2023-03-15 ENCOUNTER — Other Ambulatory Visit (INDEPENDENT_AMBULATORY_CARE_PROVIDER_SITE_OTHER): Payer: BLUE CROSS/BLUE SHIELD

## 2023-03-15 DIAGNOSIS — M79641 Pain in right hand: Secondary | ICD-10-CM | POA: Diagnosis not present

## 2023-03-15 DIAGNOSIS — S62614A Displaced fracture of proximal phalanx of right ring finger, initial encounter for closed fracture: Secondary | ICD-10-CM

## 2023-03-15 MED ORDER — HYDROCODONE-ACETAMINOPHEN 5-325 MG PO TABS: 1.0000 | ORAL_TABLET | Freq: Four times a day (QID) | ORAL | 0 refills | Status: DC | PRN

## 2023-03-15 NOTE — Progress Notes (Signed)
The patient is a 62 year old female with multiple comorbidities.  She is on chronic Xarelto.  She injured her hand a week ago after mechanical fall.  She was seen by primary care physician and x-ray showed a proximal phalanx fracture of the ring finger and a metacarpal head fracture of the fourth ray.  This is on the right dominant side.  She has bruise significantly due to being on blood thinning medication.  She denies any significant loss of function other than pain that she is having.  She is not interested in any type of surgical intervention.  She does have multiple comorbidities.  Examination right hand shows significant bruising of her hand from being on blood thinning medication.  She actually surprisingly has good flexion extension of all of her fingers and good abduction and adduction.  There is no rotational deformity.  X-rays of her right hand are seen today and compared to previous x-rays.  She does have fractures of the proximal phalanx at the MCP joint on the fourth ray as well as the metacarpal head of the fourth ray.  These have not changed significantly when reviewing x-rays from a week ago.  I agree with her treating this conservatively since she is of low demand.  I would like to see her back in 3 weeks with a repeat 3 views of her right hand.  We can also try some therapy at that point if needed.  All questions concerns were answered and addressed.  Will send in some hydrocodone for pain.

## 2023-03-18 ENCOUNTER — Other Ambulatory Visit: Payer: Self-pay | Admitting: Orthopaedic Surgery

## 2023-03-18 ENCOUNTER — Telehealth: Payer: Self-pay | Admitting: Orthopaedic Surgery

## 2023-03-18 MED ORDER — HYDROCODONE-ACETAMINOPHEN 5-325 MG PO TABS: 1.0000 | ORAL_TABLET | Freq: Four times a day (QID) | ORAL | 0 refills | Status: DC | PRN

## 2023-03-18 NOTE — Telephone Encounter (Signed)
Pt would like her Hydrocodone sent to [facility] in [place] being it was on back order at other pharmacy please advise

## 2023-03-22 ENCOUNTER — Encounter: Payer: BLUE CROSS/BLUE SHIELD | Admitting: Physical Medicine and Rehabilitation

## 2023-03-23 ENCOUNTER — Other Ambulatory Visit: Payer: Self-pay | Admitting: Family Medicine

## 2023-03-25 ENCOUNTER — Telehealth: Payer: Self-pay | Admitting: Orthopaedic Surgery

## 2023-03-25 ENCOUNTER — Other Ambulatory Visit: Payer: Self-pay | Admitting: Orthopaedic Surgery

## 2023-03-25 MED ORDER — HYDROCODONE-ACETAMINOPHEN 5-325 MG PO TABS: 1.0000 | ORAL_TABLET | Freq: Four times a day (QID) | ORAL | 0 refills | Status: DC | PRN

## 2023-03-25 NOTE — Telephone Encounter (Signed)
Patient called asked if Rx for Hydrocodone can be sent to [facility] on [address]  [facility]   Pt number [phone]

## 2023-03-25 NOTE — Telephone Encounter (Signed)
Patient aware this was called in for her  

## 2023-03-25 NOTE — Telephone Encounter (Signed)
Patient called needing Rx refilled Hydrocodone. The number to contact patient is [phone]

## 2023-03-30 ENCOUNTER — Telehealth: Payer: Self-pay | Admitting: Orthopaedic Surgery

## 2023-03-30 ENCOUNTER — Other Ambulatory Visit: Payer: Self-pay | Admitting: Physician Assistant

## 2023-03-30 MED ORDER — HYDROCODONE-ACETAMINOPHEN 5-325 MG PO TABS: 1.0000 | ORAL_TABLET | Freq: Four times a day (QID) | ORAL | 0 refills | Status: DC | PRN

## 2023-03-30 NOTE — Telephone Encounter (Signed)
Patient called. She would like hydrocodone called in to [facility] on [address]. Her cb# [phone]

## 2023-03-31 ENCOUNTER — Other Ambulatory Visit: Payer: Self-pay | Admitting: Family Medicine

## 2023-04-07 ENCOUNTER — Telehealth: Payer: Self-pay | Admitting: Orthopaedic Surgery

## 2023-04-07 ENCOUNTER — Other Ambulatory Visit: Payer: Self-pay | Admitting: Orthopaedic Surgery

## 2023-04-07 ENCOUNTER — Telehealth: Payer: Self-pay | Admitting: *Deleted

## 2023-04-07 MED ORDER — TIZANIDINE HCL 4 MG PO TABS: 8.0000 mg | ORAL_TABLET | Freq: Three times a day (TID) | ORAL | 0 refills | Status: DC

## 2023-04-07 MED ORDER — BACLOFEN 10 MG PO TABS: 10.0000 mg | ORAL_TABLET | Freq: Three times a day (TID) | ORAL | 0 refills | Status: DC

## 2023-04-07 MED ORDER — HYDROCODONE-ACETAMINOPHEN 5-325 MG PO TABS: 1.0000 | ORAL_TABLET | Freq: Three times a day (TID) | ORAL | 0 refills | Status: DC | PRN

## 2023-04-07 NOTE — Telephone Encounter (Signed)
Patient requesting refills on baclofen and tizanidine

## 2023-04-07 NOTE — Telephone Encounter (Signed)
Patient called. She hit her finger. In some pain. Would like hydrocodone called in to [facility] [address] Her cb# is [phone]

## 2023-04-09 ENCOUNTER — Telehealth: Payer: Self-pay

## 2023-04-09 NOTE — Telephone Encounter (Signed)
Lvm advising pt.

## 2023-04-09 NOTE — Telephone Encounter (Signed)
[facility] pharmacy called stating that they need clarification on a drug interaction with Hydrocodone and Xanax.  Per pharmacist Xanax was provided by another provider.  Cb# [phone].  Please advise.  Thank you

## 2023-04-09 NOTE — Telephone Encounter (Signed)
Called and confirmed pt received message to not take medications together

## 2023-04-12 ENCOUNTER — Encounter
Payer: BLUE CROSS/BLUE SHIELD | Attending: Physical Medicine and Rehabilitation | Admitting: Physical Medicine and Rehabilitation

## 2023-04-12 ENCOUNTER — Encounter: Payer: Self-pay | Admitting: Physical Medicine and Rehabilitation

## 2023-04-12 VITALS — BP 88/56 | HR 57 | Ht 66.0 in | Wt 154.0 lb

## 2023-04-12 DIAGNOSIS — M47812 Spondylosis without myelopathy or radiculopathy, cervical region: Secondary | ICD-10-CM

## 2023-04-12 DIAGNOSIS — S13100S Subluxation of unspecified cervical vertebrae, sequela: Secondary | ICD-10-CM | POA: Diagnosis not present

## 2023-04-12 DIAGNOSIS — M542 Cervicalgia: Secondary | ICD-10-CM | POA: Diagnosis not present

## 2023-04-12 DIAGNOSIS — G8929 Other chronic pain: Secondary | ICD-10-CM | POA: Diagnosis not present

## 2023-04-12 DIAGNOSIS — M7918 Myalgia, other site: Secondary | ICD-10-CM

## 2023-04-12 DIAGNOSIS — M797 Fibromyalgia: Secondary | ICD-10-CM | POA: Diagnosis not present

## 2023-04-12 MED ORDER — LIDOCAINE HCL 1 % IJ SOLN
6.0000 mL | Freq: Once | INTRAMUSCULAR | Status: AC
Start: 2023-04-12 — End: 2023-04-12
  Administered 2023-04-12: 6 mL

## 2023-04-12 MED ORDER — TIZANIDINE HCL 4 MG PO TABS: 8.0000 mg | ORAL_TABLET | Freq: Three times a day (TID) | ORAL | 5 refills | Status: DC

## 2023-04-12 MED ORDER — BACLOFEN 10 MG PO TABS: 10.0000 mg | ORAL_TABLET | Freq: Three times a day (TID) | ORAL | 5 refills | Status: DC

## 2023-04-12 NOTE — Progress Notes (Signed)
Patient is a 62 yr old female with hx of multiple spinal surgeries- all surgeries on neck and neck and back pain, fibromyalgia, and previous L carpal tunnel surgery- also hx of bipolarII d/o;  Here for trigger point injections and f/u on chronic pain.                     Picking up dirty laundry off floor- crouched over- stuck around fet-  Tried to save herself with R hand and hurts really bad- Broke 4th digit in 2 places  On Norco 5/325 mg 3x/day-   Helps other pain as well  esp in shoulder.   Niece just passed away=  drove to [place]- rented a Mercedes SUV to go there.   Did notice a difference with vehicle- [employer] SUV- much more comfortable.  Got back last Tuesday- gone ~ 1 week.   Feet are cracking- the bumps are "splitting"- cream from PCP- causing pain as well   "Getting bedsores"-   Slept a lot since got home from [place].   Being depressed doesn't help.   On Celexa 20 mg daily and Xanax for anxiety.      Plan:  Refill Baclofen  5 mg 3x/day- # 90- 5 refills.    2. Con't Zanaflex  8 mg 3x/day- for muscle spasms- #180- 5 refills.    3. Patient here for trigger point injections for myofascial pain  Consent done and on chart.  Cleaned areas with alcohol and injected using a 27 gauge 1.5 inch needle  Injected  5cc- wasted 1cc Using 1% Lidocaine with no EPI  Upper traps B/L x2 Levators BB/L x2 Posterior scalenes-  Middle scalenes- B/L  Splenius Capitus Pectoralis Major- doesn't like done Rhomboids B/L x2 Infraspinatus Teres Major/minor Thoracic paraspinals B/L  Lumbar paraspinals- B/L  Other injections-      There was no bleeding or complications.  Patient was advised to drink a lot of water on day after injections to flush system Will have increased soreness for 12-48 hours after injections.  Can use Lidocaine patches the day AFTER injections Can use theracane on day of injections in places didn't inject Can use heating pad 4-6 hours AFTER  injections  4. F/U in 6 weeks- trp injections. And f/u on pain  5. BP 88/52- asked pt to check at home- if stays low, needs to hold BP meds.

## 2023-04-12 NOTE — Patient Instructions (Signed)
Plan:  Refill Baclofen  5 mg 3x/day- # 90- 5 refills.    2. Con't Zanaflex  8 mg 3x/day- for muscle spasms- #180- 5 refills.    3. Patient here for trigger point injections for myofascial pain  Consent done and on chart.  Cleaned areas with alcohol and injected using a 27 gauge 1.5 inch needle  Injected  5cc- wasted 1cc Using 1% Lidocaine with no EPI  Upper traps B/L x2 Levators BB/L x2 Posterior scalenes-  Middle scalenes- B/L  Splenius Capitus Pectoralis Major- doesn't like done Rhomboids B/L x2 Infraspinatus Teres Major/minor Thoracic paraspinals B/L  Lumbar paraspinals- B/L  Other injections-      There was no bleeding or complications.  Patient was advised to drink a lot of water on day after injections to flush system Will have increased soreness for 12-48 hours after injections.  Can use Lidocaine patches the day AFTER injections Can use theracane on day of injections in places didn't inject Can use heating pad 4-6 hours AFTER injections  4. F/U in 6 weeks- trp injections. And f/u on pain

## 2023-04-14 ENCOUNTER — Ambulatory Visit: Payer: BLUE CROSS/BLUE SHIELD | Admitting: Orthopaedic Surgery

## 2023-04-14 ENCOUNTER — Other Ambulatory Visit (INDEPENDENT_AMBULATORY_CARE_PROVIDER_SITE_OTHER): Payer: BLUE CROSS/BLUE SHIELD

## 2023-04-14 ENCOUNTER — Encounter: Payer: Self-pay | Admitting: Orthopaedic Surgery

## 2023-04-14 DIAGNOSIS — S62614A Displaced fracture of proximal phalanx of right ring finger, initial encounter for closed fracture: Secondary | ICD-10-CM | POA: Diagnosis not present

## 2023-04-14 DIAGNOSIS — M79641 Pain in right hand: Secondary | ICD-10-CM

## 2023-04-14 MED ORDER — HYDROCODONE-ACETAMINOPHEN 5-325 MG PO TABS: 1.0000 | ORAL_TABLET | Freq: Three times a day (TID) | ORAL | 0 refills | Status: DC | PRN

## 2023-04-14 NOTE — Progress Notes (Signed)
The patient is now over 5 weeks status post injury to her right dominant hand when she sustained fractures to the fourth metacarpal and fourth proximal phalanx.  She says she still have some pain but overall is doing well.  On exam she can make a composite fist.  There is some clicking at the fracture site itself of the fourth metacarpal but no malalignment or malrotation.  3 views of the hand show the fractures are healing with no complicating features.  I gave her reassurance that this should be healing fine.  She knows to avoid heavy gripping activities for at least another 4 to 6 weeks.  I will send in 1 more prescription for hydrocodone.  Follow-up is as needed.

## 2023-04-22 ENCOUNTER — Other Ambulatory Visit: Payer: Self-pay | Admitting: Family Medicine

## 2023-05-09 ENCOUNTER — Other Ambulatory Visit: Payer: Self-pay | Admitting: Adult Health

## 2023-05-09 DIAGNOSIS — F4310 Post-traumatic stress disorder, unspecified: Secondary | ICD-10-CM

## 2023-05-09 DIAGNOSIS — F331 Major depressive disorder, recurrent, moderate: Secondary | ICD-10-CM

## 2023-05-09 DIAGNOSIS — F3181 Bipolar II disorder: Secondary | ICD-10-CM

## 2023-05-11 ENCOUNTER — Other Ambulatory Visit: Payer: Self-pay | Admitting: Cardiovascular Disease

## 2023-05-22 ENCOUNTER — Other Ambulatory Visit: Payer: Self-pay | Admitting: Family Medicine

## 2023-05-24 ENCOUNTER — Other Ambulatory Visit: Payer: Self-pay | Admitting: Adult Health

## 2023-05-24 ENCOUNTER — Telehealth: Payer: Self-pay | Admitting: Adult Health

## 2023-05-24 DIAGNOSIS — F411 Generalized anxiety disorder: Secondary | ICD-10-CM

## 2023-05-24 NOTE — Telephone Encounter (Signed)
Pended.

## 2023-05-24 NOTE — Telephone Encounter (Signed)
Pt called to request RF on her Xanax. Please send to : [id number] [place], [place] - [address]  [address]  Has Appt Oct 3

## 2023-05-25 DIAGNOSIS — H7293 Unspecified perforation of tympanic membrane, bilateral: Secondary | ICD-10-CM | POA: Diagnosis not present

## 2023-05-25 DIAGNOSIS — H906 Mixed conductive and sensorineural hearing loss, bilateral: Secondary | ICD-10-CM | POA: Diagnosis not present

## 2023-05-25 DIAGNOSIS — Z9089 Acquired absence of other organs: Secondary | ICD-10-CM | POA: Diagnosis not present

## 2023-05-25 DIAGNOSIS — H9212 Otorrhea, left ear: Secondary | ICD-10-CM | POA: Diagnosis not present

## 2023-05-27 ENCOUNTER — Ambulatory Visit (INDEPENDENT_AMBULATORY_CARE_PROVIDER_SITE_OTHER): Payer: Self-pay | Admitting: Adult Health

## 2023-05-27 DIAGNOSIS — Z0389 Encounter for observation for other suspected diseases and conditions ruled out: Secondary | ICD-10-CM

## 2023-05-27 NOTE — Progress Notes (Signed)
Patient no show appointment. ? ?

## 2023-05-28 ENCOUNTER — Other Ambulatory Visit: Payer: Self-pay | Admitting: Family Medicine

## 2023-05-28 ENCOUNTER — Ambulatory Visit: Payer: BLUE CROSS/BLUE SHIELD | Admitting: Adult Health

## 2023-05-28 ENCOUNTER — Encounter: Payer: Self-pay | Admitting: Adult Health

## 2023-05-28 DIAGNOSIS — Z1211 Encounter for screening for malignant neoplasm of colon: Secondary | ICD-10-CM

## 2023-05-28 DIAGNOSIS — F3181 Bipolar II disorder: Secondary | ICD-10-CM

## 2023-05-28 DIAGNOSIS — F411 Generalized anxiety disorder: Secondary | ICD-10-CM

## 2023-05-28 DIAGNOSIS — F4310 Post-traumatic stress disorder, unspecified: Secondary | ICD-10-CM

## 2023-05-28 DIAGNOSIS — Z1212 Encounter for screening for malignant neoplasm of rectum: Secondary | ICD-10-CM

## 2023-05-28 MED ORDER — ALPRAZOLAM 1 MG PO TABS: 1.0000 mg | ORAL_TABLET | Freq: Two times a day (BID) | ORAL | 2 refills | Status: DC

## 2023-05-28 MED ORDER — CITALOPRAM HYDROBROMIDE 20 MG PO TABS: 20.0000 mg | ORAL_TABLET | Freq: Every day | ORAL | 3 refills | Status: DC

## 2023-05-28 MED ORDER — RISPERIDONE 1 MG PO TABS
ORAL_TABLET | ORAL | 3 refills | Status: DC
Start: 2023-05-28 — End: 2024-03-24

## 2023-05-28 MED ORDER — BUSPIRONE HCL 15 MG PO TABS
15.0000 mg | ORAL_TABLET | Freq: Three times a day (TID) | ORAL | 3 refills | Status: DC
Start: 2023-05-28 — End: 2024-03-24

## 2023-05-28 NOTE — Progress Notes (Signed)
CANDID CERNAK [id number] [birth year] 62 y.o.  Subjective:   Patient ID:  [name] is a 62 y.o. (DOB Dec 31, 1960) female.  Chief Complaint: No chief complaint on file.   HPI [name] presents to the office today for follow-up of GAD, PTSD, BPD 2.   Describes mood today as "so-so". Pleasant. Denies tearfulness. Mood symptoms - reports depression, anxiety, and irritability. Reports panic attacks. Denies worry, rumination and over thinking. Mood is lower. Stating "I feel like I'm managing". Feels like medications as helpful. Chronic pain issues - working with pain management. She and husband doing well. Stable interest and motivation. Taking medications as prescribed.  Energy levels lower. Active, does not have a regular exercise routine.  Enjoys some usual interests and activities. Married. Lives with husband. Talking to children - 1 son and 1 daughter. Spending time with family. Appetite adequate. Weight loss - 148 pounds. Sleeps well most nights. Averages 12 hours a day in increments. Focus and concentration stable. Completing tasks. Managing aspects of household. Disabled. Denies SI or HI.  Denies AH or VH. Denies self harm. Denies substance use.  Previous medications - Abilify, Wellbutrin, Gabapentin, Lyrica, Prozac       PHQ2-9    Flowsheet Row Office Visit from 04/12/2023 in [facility] Physical Medicine & Rehabilitation Office Visit from 11/09/2022 in [facility] Physical Medicine & Rehabilitation Office Visit from 08/31/2022 in [facility] Physical Medicine & Rehabilitation Office Visit from 04/13/2022 in [facility] Physical Medicine & Rehabilitation Office Visit from 01/26/2022 in [facility] Physical Medicine & Rehabilitation  PHQ-2 Total Score 2 0 2 0 2      Flowsheet Row ED to Hosp-Admission (Discharged) from 02/26/2022 in [facility] 4E CV SURGICAL PROGRESSIVE CARE ED from 02/12/2022 in [facility] Emergency Department at [facility]  C-SSRS RISK CATEGORY No  Risk No Risk        Review of Systems:  Review of Systems  Musculoskeletal:  Negative for gait problem.  Neurological:  Negative for tremors.  Psychiatric/Behavioral:         Please refer to HPI    Medications: I have reviewed the patient's current medications.  Current Outpatient Medications  Medication Sig Dispense Refill   acetaminophen (TYLENOL) 500 MG tablet Take 1,000 mg by mouth every 6 (six) hours as needed for mild pain or headache.      ALPRAZolam (XANAX) 1 MG tablet TAKE 1 TABLET (1 MG TOTAL) BY MOUTH IN THE MORNING AND AT BEDTIME. 60 tablet 0   baclofen (LIORESAL) 10 MG tablet Take 1 tablet (10 mg total) by mouth 3 (three) times daily. With Zanaflex- for muscle spasms 90 each 5   bisacodyl (DULCOLAX) 5 MG EC tablet Take 5 mg by mouth 2 (two) times daily as needed for moderate constipation.      busPIRone (BUSPAR) 15 MG tablet Take 1 tablet (15 mg total) by mouth 3 (three) times daily. 270 tablet 3   ciprofloxacin (CILOXAN) 0.3 % ophthalmic solution 4 drops See admin instructions. Instill to right ear 2 times daily as needed for ear infection     citalopram (CELEXA) 20 MG tablet Take 1 tablet (20 mg total) by mouth daily. 90 tablet 3   clotrimazole-betamethasone (LOTRISONE) cream Apply 1 Application topically daily. 30 g 0   dexamethasone (DECADRON) 0.1 % ophthalmic solution 2 drops See admin instructions. Instill into right ear as needed for ear infection     hydrochlorothiazide (HYDRODIURIL) 25 MG tablet TAKE 1 TABLET (25 MG TOTAL) BY  MOUTH DAILY. 90 tablet 1   HYDROcodone-acetaminophen (NORCO/VICODIN) 5-325 MG tablet Take 1 tablet by mouth 3 (three) times daily as needed for moderate pain. 20 tablet 0   hydrOXYzine (ATARAX/VISTARIL) 25 MG tablet TAKE 3 TO 4 TABLETS BY MOUTH EVERY 8 HOURS AS NEEDED FOR ANXIETY. MAX AMOUNT PER INSURANCE (Patient taking differently: Take 25 mg by mouth daily.) 360 tablet 2   levothyroxine (SYNTHROID) 50 MCG tablet TAKE 1 TABLET BY MOUTH EVERY  DAY 90 tablet 0   loratadine (CLARITIN) 10 MG tablet Take 10 mg by mouth 2 (two) times daily as needed for allergies (for seasonal allergies).     meclizine (ANTIVERT) 25 MG tablet Take 1 tablet (25 mg total) by mouth 3 (three) times daily as needed for dizziness. 30 tablet 3   metoprolol tartrate (LOPRESSOR) 25 MG tablet TAKE 1 TABLET BY MOUTH TWICE A DAY 60 tablet 0   naproxen (NAPROSYN) 500 MG tablet TAKE 1 TABLET DAILY AS NEEDED FOR PAIN. USE SPARINGLY. 30 tablet 0   risperiDONE (RISPERDAL) 1 MG tablet TAKE 1 TABLET BY MOUTH EVERYDAY AT BEDTIME 90 tablet 3   tiZANidine (ZANAFLEX) 4 MG tablet Take 2 tablets (8 mg total) by mouth 3 (three) times daily. TAKE ONE AND ONE HALF TO TWO TABLETS BY MOUTH THREE TIMES DAILY 180 tablet 5   XARELTO 20 MG TABS tablet TAKE 1 TABLET BY MOUTH DAILY WITH SUPPER. 30 tablet 3   Current Facility-Administered Medications  Medication Dose Route Frequency Provider Last Rate Last Admin   lidocaine (XYLOCAINE) 1 % (with pres) injection 6 mL  6 mL Other Once Lovorn, Megan, MD       lidocaine (XYLOCAINE) 1 % (with pres) injection 6 mL  6 mL Other Once Lovorn, Megan, MD        Medication Side Effects: None  Allergies:  Allergies  Allergen Reactions   Morphine And Codeine Other (See Comments)    "Didn't agree with me/wasn't very effective.."   Oxycodone-Acetaminophen     Other Reaction(s): Dizziness  Rash,did not help with pain   Lyrica [Pregabalin] Rash   Neurontin [Gabapentin] Other (See Comments)    Left patient sleepy all the time    Past Medical History:  Diagnosis Date   Back injury    Chronic   Bipolar disorder (HCC)    Cardiomyopathy (HCC)    hypertension controlled-helped with edema-Dr Nahser   Deafness    Left ear   Degenerative joint disease    with neck surgery   Depression    Diastolic dysfunction    with possible mild LVOT gradient   Dyspnea    Dysrhythmia    per patient, took too much naproxen which caused an "irregular heart  beat"   Family history of adverse reaction to anesthesia    " my daughter has PONV and gets very grumpy"   Fibromyalgia    Former cigarette smoker    Gestational diabetes    H/O blood clots    due to side effect of medication   Headache(784.0)    migraines   Hypertension    Hypothyroidism    Leg cramps    Neuropathy    Pain    chronic    Pneumonia    PTSD (post-traumatic stress disorder)    Respiratory disorder    Schizophrenia (HCC)     Past Medical History, Surgical history, Social history, and Family history were reviewed and updated as appropriate.   Please see review of systems for further details on  the patient's review from today.   Objective:   Physical Exam:  There were no vitals taken for this visit.  Physical Exam Constitutional:      General: She is not in acute distress. Musculoskeletal:        General: No deformity.  Neurological:     Mental Status: She is alert and oriented to person, place, and time.     Coordination: Coordination normal.  Psychiatric:        Attention and Perception: Attention and perception normal. She does not perceive auditory or visual hallucinations.        Mood and Affect: Affect is not labile, blunt, angry or inappropriate.        Speech: Speech normal.        Behavior: Behavior normal.        Thought Content: Thought content normal. Thought content is not paranoid or delusional. Thought content does not include homicidal or suicidal ideation. Thought content does not include homicidal or suicidal plan.        Cognition and Memory: Cognition and memory normal.        Judgment: Judgment normal.     Comments: Insight intact     Lab Review:     Component Value Date/Time   NA 133 (L) 02/28/2022 0113   K 4.1 02/28/2022 0113   CL 101 02/28/2022 0113   CO2 24 02/28/2022 0113   GLUCOSE 136 (H) 02/28/2022 0113   BUN 5 (L) 02/28/2022 0113   CREATININE 0.69 02/28/2022 0113   CREATININE 0.85 01/19/2020 1507   CALCIUM 8.5 (L)  02/28/2022 0113   PROT 6.0 (L) 02/27/2022 0507   ALBUMIN 3.0 (L) 02/27/2022 0507   AST 12 (L) 02/27/2022 0507   ALT 14 02/27/2022 0507   ALKPHOS 68 02/27/2022 0507   BILITOT 0.6 02/27/2022 0507   GFRNONAA >60 02/28/2022 0113   GFRAA >60 01/26/2020 0839       Component Value Date/Time   WBC 5.9 02/28/2022 0113   RBC 3.19 (L) 02/28/2022 0113   HGB 10.8 (L) 02/28/2022 0113   HCT 31.7 (L) 02/28/2022 0113   PLT 434 (H) 02/28/2022 0113   MCV 99.4 02/28/2022 0113   MCH 33.9 02/28/2022 0113   MCHC 34.1 02/28/2022 0113   RDW 13.6 02/28/2022 0113   LYMPHSABS 0.5 (L) 02/28/2022 0113   MONOABS 0.2 02/28/2022 0113   EOSABS 0.0 02/28/2022 0113   BASOSABS 0.0 02/28/2022 0113    No results found for: "POCLITH", "LITHIUM"   No results found for: "PHENYTOIN", "PHENOBARB", "VALPROATE", "CBMZ"   .res Assessment: Plan:    Plan:  Buspar 15mg  TID Xanax 1mg  BID  Celexa 20mg  daily Risperdal 1mg  at bedtime   6 months  Therapy - Rockne Menghini  Patient advised to contact office with any questions, adverse effects, or acute worsening in signs and symptoms.  Discussed potential benefits, risk, and side effects of benzodiazepines to include potential risk of tolerance and dependence, as well as possible drowsiness.  Advised patient not to drive if experiencing drowsiness and to take lowest possible effective dose to minimize risk of dependence and tolerance.  Discussed potential metabolic side effects associated with atypical antipsychotics, as well as potential risk for movement side effects. Advised pt to contact office if movement side effects occur.   There are no diagnoses linked to this encounter.   Please see After Visit Summary for patient specific instructions.  Future Appointments  Date Time Provider Department Center  05/28/2023  4:00 PM [place], [place]  Wilhemina Bonito, NP CP-CP None  05/31/2023  1:40 PM Lovorn, Aundra Millet, MD [facility] CPR    No orders of the defined types were placed in  this encounter.   -------------------------------

## 2023-05-31 ENCOUNTER — Encounter: Payer: BLUE CROSS/BLUE SHIELD | Admitting: Physical Medicine and Rehabilitation

## 2023-06-01 ENCOUNTER — Other Ambulatory Visit: Payer: Self-pay | Admitting: Family Medicine

## 2023-06-04 ENCOUNTER — Ambulatory Visit: Payer: BLUE CROSS/BLUE SHIELD | Admitting: Family Medicine

## 2023-06-07 ENCOUNTER — Ambulatory Visit: Payer: BLUE CROSS/BLUE SHIELD | Admitting: Family Medicine

## 2023-06-07 ENCOUNTER — Encounter: Payer: Self-pay | Admitting: Family Medicine

## 2023-06-07 VITALS — BP 120/83 | HR 89 | Temp 98.0°F | Ht 65.0 in | Wt 152.1 lb

## 2023-06-07 DIAGNOSIS — K644 Residual hemorrhoidal skin tags: Secondary | ICD-10-CM

## 2023-06-07 MED ORDER — HYDROCORTISONE ACETATE 25 MG RE SUPP
25.0000 mg | Freq: Two times a day (BID) | RECTAL | 0 refills | Status: AC
Start: 2023-06-07 — End: ?

## 2023-06-07 MED ORDER — HYDROCODONE-ACETAMINOPHEN 5-325 MG PO TABS
1.0000 | ORAL_TABLET | Freq: Three times a day (TID) | ORAL | 0 refills | Status: DC | PRN
Start: 2023-06-07 — End: 2023-06-11

## 2023-06-07 MED ORDER — NITROGLYCERIN 0.4 % RE OINT
TOPICAL_OINTMENT | RECTAL | 0 refills | Status: AC
Start: 2023-06-07 — End: ?

## 2023-06-07 NOTE — Patient Instructions (Addendum)
If no significant improvement, please come back and we will discuss an office procedure.  Don't fill the ointment if too expensive.  Try to drink 55-60 oz of water daily outside of exercise.  Take Metamucil or Benefiber daily.  Do not drink alcohol, do any illicit/street drugs, drive or do anything that requires alertness while on this medicine.   Let us know if you need anything.

## 2023-06-07 NOTE — Progress Notes (Signed)
Chief Complaint  Patient presents with   Hemorrhoids    [name] is a 62 y.o. female here for a skin complaint.  Duration:  2.5  weeks Location: bottom Pruritic? No  Painful? Yes Bleeding? Yes New soaps/lotions/topicals/detergents? No Trauma? No Other associated symptoms: no fevers or drainage Therapies tried thus far: Prep H  Past Medical History:  Diagnosis Date   Back injury    Chronic   Bipolar disorder (HCC)    Cardiomyopathy (HCC)    hypertension controlled-helped with edema-Dr Nahser   Deafness    Left ear   Degenerative joint disease    with neck surgery   Depression    Diastolic dysfunction    with possible mild LVOT gradient   Dyspnea    Dysrhythmia    per patient, took too much naproxen which caused an "irregular heart beat"   Family history of adverse reaction to anesthesia    " my daughter has PONV and gets very grumpy"   Fibromyalgia    Former cigarette smoker    Gestational diabetes    H/O blood clots    due to side effect of medication   Headache(784.0)    migraines   Hypertension    Hypothyroidism    Leg cramps    Neuropathy    Pain    chronic    Pneumonia    PTSD (post-traumatic stress disorder)    Respiratory disorder    Schizophrenia (HCC)     BP 120/83 (BP Location: Left Arm, Patient Position: Sitting, Cuff Size: Normal)   Pulse 89   Temp 98 F (36.7 C) (Oral)   Ht 5\' 5"  (1.651 m)   Wt 152 lb 2 oz (69 kg)   SpO2 93%   BMI 25.31 kg/m  Gen: awake, alert, appearing stated age Lungs: No accessory muscle use Skin: Examined in the presence of a female chaperone.  Over the left portion of the anus, there is an external hemorrhoid that does not feel thrombosed.  Mild TTP but no obvious bleeding or fissures.   Psych: Age appropriate judgment and insight  External hemorrhoid - Plan: hydrocortisone (ANUSOL-HC) 25 MG suppository, Nitroglycerin 0.4 % OINT, HYDROcodone-acetaminophen (NORCO/VICODIN) 5-325 MG tablet  Hydrocortisone  suppositories, nitroglycerin ointment which appears to be covered by her insurance, Norco for breakthrough pain.  Warnings about narcotics verbalized and written down.  Stay hydrated and consider taking a fiber supplement daily.  If no improvement, will have to consider an office procedure to remove a potential thrombus. The patient voiced understanding and agreement to the plan.  Jilda Roche [place], DO 06/07/23 4:44 PM

## 2023-06-08 ENCOUNTER — Other Ambulatory Visit: Payer: Self-pay | Admitting: Cardiovascular Disease

## 2023-06-11 ENCOUNTER — Telehealth: Payer: Self-pay | Admitting: Family Medicine

## 2023-06-11 DIAGNOSIS — K644 Residual hemorrhoidal skin tags: Secondary | ICD-10-CM

## 2023-06-11 MED ORDER — HYDROCODONE-ACETAMINOPHEN 5-325 MG PO TABS: 1.0000 | ORAL_TABLET | Freq: Three times a day (TID) | ORAL | 0 refills | Status: DC | PRN

## 2023-06-11 NOTE — Telephone Encounter (Signed)
I'll send in one more, if treatment is working, she shouldn't need another refill. Ty.

## 2023-06-11 NOTE — Addendum Note (Signed)
Addended by: Radene Gunning on: 06/11/2023 12:30 PM   Modules accepted: Orders

## 2023-06-11 NOTE — Telephone Encounter (Signed)
Patient made aware.

## 2023-06-11 NOTE — Telephone Encounter (Signed)
#  10 last RF 06/07/23 OV---06/07/2023

## 2023-06-11 NOTE — Telephone Encounter (Signed)
Prescription Request  06/11/2023  Is this a "Controlled Substance" medicine? Yes  LOV: 06/07/2023  What is the name of the medication or equipment?   HYDROcodone-acetaminophen (NORCO/VICODIN) 5-325 MG tablet [[id number]]  Have you contacted your pharmacy to request a refill? No   Which pharmacy would you like this sent to?   [id number] [place], [place] - [address] [place] [address] Phone: [phone] Fax: [phone]    Patient notified that their request is being sent to the clinical staff for review and that they should receive a response within 2 business days.   Please advise at Mobile There is no such number on file (mobile).

## 2023-06-21 ENCOUNTER — Other Ambulatory Visit: Payer: Self-pay | Admitting: Family Medicine

## 2023-06-23 ENCOUNTER — Other Ambulatory Visit: Payer: Self-pay | Admitting: Cardiovascular Disease

## 2023-07-09 ENCOUNTER — Encounter: Payer: Self-pay | Admitting: Physical Medicine and Rehabilitation

## 2023-07-09 ENCOUNTER — Encounter
Payer: BLUE CROSS/BLUE SHIELD | Attending: Physical Medicine and Rehabilitation | Admitting: Physical Medicine and Rehabilitation

## 2023-07-09 VITALS — BP 173/97 | HR 71 | Ht 65.0 in | Wt 154.8 lb

## 2023-07-09 DIAGNOSIS — M5412 Radiculopathy, cervical region: Secondary | ICD-10-CM | POA: Diagnosis not present

## 2023-07-09 DIAGNOSIS — M7918 Myalgia, other site: Secondary | ICD-10-CM | POA: Insufficient documentation

## 2023-07-09 DIAGNOSIS — M797 Fibromyalgia: Secondary | ICD-10-CM | POA: Insufficient documentation

## 2023-07-09 MED ORDER — LIDOCAINE HCL 1 % IJ SOLN
6.0000 mL | Freq: Once | INTRAMUSCULAR | Status: AC
Start: 2023-07-09 — End: 2023-07-09
  Administered 2023-07-09: 6 mL

## 2023-07-09 NOTE — Patient Instructions (Signed)
Plan: Con't Tizanidine- has refills- from 04/12/23  2. Con't refills of Baclofen- has refills from 04/12/23  3. Patient here for trigger point injections for  Consent done and on chart.  Cleaned areas with alcohol and injected using a 27 gauge 1.5 inch needle  Injected 5 cc- 1 cc wasted Using 1% Lidocaine with no EPI  Upper traps B/L  Levators- B/L  Posterior scalenes Middle scalenes- b/L  Splenius Capitus- B/L  Pectoralis Major Rhomboids- B/L x2 Infraspinatus Teres Major/minor Thoracic paraspinals- B/L  Lumbar paraspinals- B/L x2 Other injections-    Patient's level of pain prior was 9/10 Current level of pain after injections is- still about the same  There was no bleeding or complications.  Patient was advised to drink a lot of water on day after injections to flush system Will have increased soreness for 12-48 hours after injections.  Can use Lidocaine patches the day AFTER injections Can use theracane on day of injections in places didn't inject Can use heating pad 4-6 hours AFTER injections  5. F/U in 2 months- trp injections and chronic pain

## 2023-07-09 NOTE — Progress Notes (Signed)
Patient is a 62 yr old female with hx of multiple spinal surgeries- all surgeries on neck and neck and back pain, fibromyalgia, and previous L carpal tunnel surgery- also hx of bipolarII d/o;  Here for trigger point injections and f/u on chronic pain.                       BP is through the roof for days-  has BP meds, but ran out of Zanaflex-  tried to get, but pharmacy was out of the meds.     Out of pain meds for 2 days. To get Pain meds filled today.  Norco  5/325 mg TID.   Feeling old- getting slower- and finger stil screwed up- to see Dr Carmelia Roller- about that.   Life kind of frustrating lately-  Husband talking about retiring- and not sure what to do for money.  Original plans to get RV-   Thinks muscle spasms somewhat better with Baclofen AND Zanaflex- comb seems ot help more.    Wants more injections-  Plan: Con't Tizanidine- has refills- from 04/12/23  2. Con't refills of Baclofen- has refills from 04/12/23  3. Patient here for trigger point injections for  Consent done and on chart.  Cleaned areas with alcohol and injected using a 27 gauge 1.5 inch needle  Injected 5 cc- 1 cc wasted Using 1% Lidocaine with no EPI  Upper traps B/L  Levators- B/L  Posterior scalenes Middle scalenes- b/L  Splenius Capitus- B/L  Pectoralis Major Rhomboids- B/L x2 Infraspinatus Teres Major/minor Thoracic paraspinals- B/L  Lumbar paraspinals- B/L x2 Other injections-    Patient's level of pain prior was 9/10 Current level of pain after injections is- still about the same  There was no bleeding or complications.  Patient was advised to drink a lot of water on day after injections to flush system Will have increased soreness for 12-48 hours after injections.  Can use Lidocaine patches the day AFTER injections Can use theracane on day of injections in places didn't inject Can use heating pad 4-6 hours AFTER injections  5. F/U in 2 months- trp injections and chronic  pain  6. Educated that feeling of stiffness, not moving as well is probably combo of age, lack of movement and cold-

## 2023-07-15 ENCOUNTER — Telehealth: Payer: Self-pay

## 2023-07-15 NOTE — Telephone Encounter (Signed)
Patient called stating that she can't find zanaflex in stock at any [facility], [facility] and [facility]. I advised patient to call the [facility] pharmacy, [facility], [facility] or [facility].

## 2023-07-19 ENCOUNTER — Telehealth: Payer: Self-pay | Admitting: Family Medicine

## 2023-07-19 NOTE — Telephone Encounter (Signed)
Pt would like it sent to an alternative pharmacy.   [facility] [address], [place], [place] [address]

## 2023-07-19 NOTE — Telephone Encounter (Signed)
Caled pt lvm letting her know she will need a visit and to call to schedule appt.

## 2023-07-19 NOTE — Telephone Encounter (Signed)
**  Pt would also like to know if her hemorrhoids have returned, should she continue the medication she was given.**   Prescription Request  07/19/2023  Is this a "Controlled Substance" medicine? Yes  LOV: 06/07/2023  What is the name of the medication or equipment?   HYDROcodone-acetaminophen (NORCO/VICODIN) 5-325 MG tablet [[id number]]  Have you contacted your pharmacy to request a refill? No   Which pharmacy would you like this sent to?   [facility] [address], [place] - [address] [place] [address] Phone: [phone] Fax: [phone]  Patient notified that their request is being sent to the clinical staff for review and that they should receive a response within 2 business days.   Please advise at Mobile There is no such number on file (mobile).

## 2023-07-20 ENCOUNTER — Other Ambulatory Visit: Payer: Self-pay | Admitting: Family Medicine

## 2023-07-21 ENCOUNTER — Encounter: Payer: Self-pay | Admitting: Family Medicine

## 2023-07-21 ENCOUNTER — Ambulatory Visit: Payer: BLUE CROSS/BLUE SHIELD | Admitting: Family Medicine

## 2023-07-21 ENCOUNTER — Telehealth (HOSPITAL_BASED_OUTPATIENT_CLINIC_OR_DEPARTMENT_OTHER): Payer: Self-pay

## 2023-07-21 VITALS — BP 134/86 | HR 81 | Temp 98.0°F | Resp 16 | Ht 65.0 in | Wt 154.8 lb

## 2023-07-21 DIAGNOSIS — Z1231 Encounter for screening mammogram for malignant neoplasm of breast: Secondary | ICD-10-CM | POA: Diagnosis not present

## 2023-07-21 DIAGNOSIS — K644 Residual hemorrhoidal skin tags: Secondary | ICD-10-CM | POA: Diagnosis not present

## 2023-07-21 DIAGNOSIS — B353 Tinea pedis: Secondary | ICD-10-CM

## 2023-07-21 MED ORDER — HYDROCODONE-ACETAMINOPHEN 5-325 MG PO TABS: 1.0000 | ORAL_TABLET | Freq: Three times a day (TID) | ORAL | 0 refills | Status: DC | PRN

## 2023-07-21 MED ORDER — KETOCONAZOLE 2 % EX CREA
1.0000 | TOPICAL_CREAM | Freq: Every day | CUTANEOUS | 0 refills | Status: AC
Start: 2023-07-21 — End: 2023-09-01

## 2023-07-21 NOTE — Patient Instructions (Addendum)
If you do not hear anything about your referrals in the next 1-2 weeks, call our office and ask for an update.  Use a pumice stone or foot file for the right foot.   Let us know if you need anything.

## 2023-07-21 NOTE — Progress Notes (Signed)
Chief Complaint  Patient presents with   Hemorrhoids    Discuss Hemorrhois    Subjective: Patient is a 62 y.o. female here for f/u hemorrhoids.  Patient was seen 6 weeks ago for external hemorrhoids.  She was treated with nitroglycerin ointment and hydrocortisone cream without significant relief.  She also did sitz bath's.  She is having some bleeding but no drainage.  She was using hydrocodone for breakthrough pain.  The patient has had an over 1 year history of scaling over both of her feet, worse on the left.  She has tried Lotrisone which helped for little bit but symptoms returned.  No bleeding, drainage, itching, fevers, spreading.  Past Medical History:  Diagnosis Date   Back injury    Chronic   Bipolar disorder (HCC)    Cardiomyopathy (HCC)    hypertension controlled-helped with edema-Dr Nahser   Deafness    Left ear   Degenerative joint disease    with neck surgery   Depression    Diastolic dysfunction    with possible mild LVOT gradient   Dyspnea    Dysrhythmia    per patient, took too much naproxen which caused an "irregular heart beat"   Family history of adverse reaction to anesthesia    " my daughter has PONV and gets very grumpy"   Fibromyalgia    Former cigarette smoker    Gestational diabetes    H/O blood clots    due to side effect of medication   Headache(784.0)    migraines   Hypertension    Hypothyroidism    Leg cramps    Neuropathy    Pain    chronic    Pneumonia    PTSD (post-traumatic stress disorder)    Respiratory disorder    Schizophrenia (HCC)     Objective: BP 134/86 (BP Location: Left Arm, Patient Position: Sitting, Cuff Size: Normal)   Pulse 81   Temp 98 F (36.7 C) (Oral)   Resp 16   Ht 5\' 5"  (1.651 m)   Wt 154 lb 12.8 oz (70.2 kg)   SpO2 97%   BMI 25.76 kg/m  General: Awake, appears stated age Rectal: Examined in the presence of a female chaperone. No fissures or active bleeding. Large ext hemorrhoid noted without  evidence of thrombosis.  Skin: Hyperkeratotic and thickened skin with scale over the left plantar surface of the foot.  Some cracks/fissures noted.  No active bleeding, fluctuance, drainage, ecchymosis, or erythema.  Feels much smaller area noted over the midfoot on the right. Lungs: No accessory muscle use Psych: Age appropriate judgment and insight, normal affect and mood  Assessment and Plan: External hemorrhoid - Plan: Ambulatory referral to General Surgery, HYDROcodone-acetaminophen (NORCO/VICODIN) 5-325 MG tablet  Tinea pedis of both feet - Plan: ketoconazole (NIZORAL) 2 % cream, Ambulatory referral to Podiatry  Encounter for screening mammogram for malignant neoplasm of breast - Plan: MM 3D SCREENING MAMMOGRAM BILATERAL BREAST, CANCELED: MM DIGITAL SCREENING BILATERAL  Refer gen surg.  Refill hydrocodone for breakthrough pain.  Continue hydrocortisone and nitroglycerin topical treatment.  Sitz bath recommended. Chronic, not controlled.  Refer to podiatry for their opinion.  6 weeks of ketoconazole cream.  Looks like moccasin dermatitis. Mammogram ordered. The patient voiced understanding and agreement to the plan.  Jilda Roche [place], DO 07/21/23  5:13 PM

## 2023-07-27 ENCOUNTER — Telehealth: Payer: Self-pay | Admitting: Family Medicine

## 2023-07-27 DIAGNOSIS — K644 Residual hemorrhoidal skin tags: Secondary | ICD-10-CM

## 2023-07-27 MED ORDER — HYDROCODONE-ACETAMINOPHEN 5-325 MG PO TABS
1.0000 | ORAL_TABLET | Freq: Three times a day (TID) | ORAL | 0 refills | Status: DC | PRN
Start: 2023-07-27 — End: 2023-08-03

## 2023-07-27 NOTE — Addendum Note (Signed)
Addended by: Radene Gunning on: 07/27/2023 04:20 PM   Modules accepted: Orders

## 2023-07-27 NOTE — Telephone Encounter (Signed)
Medication: HYDROcodone-acetaminophen (NORCO/VICODIN) 5-325 MG tablet  Has the patient contacted their pharmacy? Yes.     Preferred Pharmacy:   [facility] [address], [place] - [address] [place] [address] Phone: [phone]  Fax: [phone]

## 2023-08-02 ENCOUNTER — Telehealth: Payer: Self-pay | Admitting: Family Medicine

## 2023-08-02 NOTE — Telephone Encounter (Signed)
Called pt lvm regarding medication need to give the office a call, Dr.Wendling said he refill it on 07/27/2023. Needed to see if she was using medication for pain and have she gotten appt with speciality yet.

## 2023-08-02 NOTE — Telephone Encounter (Signed)
Prescription Request  08/02/2023  Is this a "Controlled Substance" medicine? Yes  LOV: 07/21/2023  What is the name of the medication or equipment?  HYDROcodone-acetaminophen (NORCO/VICODIN) 5-325 MG tablet   Have you contacted your pharmacy to request a refill? No   Which pharmacy would you like this sent to?   [id number] [place], [place] - [address] [place] [address] Phone: [phone] Fax: [phone]    Patient notified that their request is being sent to the clinical staff for review and that they should receive a response within 2 business days.   Please advise at [facility] [phone]

## 2023-08-03 MED ORDER — HYDROCODONE-ACETAMINOPHEN 5-325 MG PO TABS: 1.0000 | ORAL_TABLET | Freq: Three times a day (TID) | ORAL | 0 refills | Status: DC | PRN

## 2023-08-03 NOTE — Telephone Encounter (Signed)
Pt called stated she is taking Hydrocodone 2-3  times daily depend on pain. Have appt with speciality but not until January.

## 2023-08-09 ENCOUNTER — Encounter (HOSPITAL_BASED_OUTPATIENT_CLINIC_OR_DEPARTMENT_OTHER): Payer: Self-pay

## 2023-08-09 ENCOUNTER — Ambulatory Visit (HOSPITAL_BASED_OUTPATIENT_CLINIC_OR_DEPARTMENT_OTHER)
Admission: RE | Admit: 2023-08-09 | Discharge: 2023-08-09 | Disposition: A | Discharge status: Home / Self Care | Payer: BLUE CROSS/BLUE SHIELD | Source: Ambulatory Visit | Attending: Family Medicine | Admitting: Family Medicine

## 2023-08-09 DIAGNOSIS — Z1231 Encounter for screening mammogram for malignant neoplasm of breast: Secondary | ICD-10-CM | POA: Insufficient documentation

## 2023-08-11 ENCOUNTER — Encounter: Payer: Self-pay | Admitting: Podiatry

## 2023-08-11 ENCOUNTER — Ambulatory Visit: Payer: BLUE CROSS/BLUE SHIELD | Admitting: Podiatry

## 2023-08-11 DIAGNOSIS — B353 Tinea pedis: Secondary | ICD-10-CM

## 2023-08-11 MED ORDER — TRIAMCINOLONE ACETONIDE 0.1 % EX CREA: 1.0000 | TOPICAL_CREAM | Freq: Two times a day (BID) | CUTANEOUS | 0 refills | Status: AC

## 2023-08-11 NOTE — Progress Notes (Signed)
  Subjective:  Patient ID: [name], female    DOB: [birth year],   MRN: [id number]  No chief complaint on file.   62 y.o. female presents for concern of scaling itching and pain on the bottom of both of her feet primarily the left foot. Relates this has been ongoing for over a year after she was attached by a dog. She is getting cracks in the heels. Has been on lotrisone which helped for a period of time and was recently put on ketoconazole.  . Denies any other pedal complaints. Denies n/v/f/c.   Past Medical History:  Diagnosis Date   Back injury    Chronic   Bipolar disorder (HCC)    Cardiomyopathy (HCC)    hypertension controlled-helped with edema-Dr Nahser   Deafness    Left ear   Degenerative joint disease    with neck surgery   Depression    Diastolic dysfunction    with possible mild LVOT gradient   Dyspnea    Dysrhythmia    per patient, took too much naproxen which caused an "irregular heart beat"   Family history of adverse reaction to anesthesia    " my daughter has PONV and gets very grumpy"   Fibromyalgia    Former cigarette smoker    Gestational diabetes    H/O blood clots    due to side effect of medication   Headache(784.0)    migraines   Hypertension    Hypothyroidism    Leg cramps    Neuropathy    Pain    chronic    Pneumonia    PTSD (post-traumatic stress disorder)    Respiratory disorder    Schizophrenia (HCC)     Objective:  Physical Exam: Vascular: DP/PT pulses 2/4 bilateral. CFT <3 seconds. Normal hair growth on digits. No edema.  Skin. No lacerations or abrasions bilateral feet. Bilateral scalling and erythema noted to the plantar foot especially on the left in moccassin like distribution.  Musculoskeletal: MMT 5/5 bilateral lower extremities in DF, PF, Inversion and Eversion. Deceased ROM in DF of ankle joint.  Neurological: Sensation intact to light touch.   Assessment:   1. Tinea pedis of both feet      Plan:  Patient was  evaluated and treated and all questions answered. -Examined patient -Discussed treatment options for tinea pedis vs dermatitis  -Will add on triamcinalone to see if this adds with flares and continue ketoconazole  -Patient to return in 3 weeks for recheck.    Louann Sjogren, DPM

## 2023-08-13 ENCOUNTER — Other Ambulatory Visit: Payer: Self-pay | Admitting: Family Medicine

## 2023-08-13 ENCOUNTER — Telehealth: Payer: Self-pay

## 2023-08-13 ENCOUNTER — Ambulatory Visit: Payer: BLUE CROSS/BLUE SHIELD | Admitting: Physical Medicine and Rehabilitation

## 2023-08-13 MED ORDER — LIDOCAINE 5 % EX OINT: 1.0000 | TOPICAL_OINTMENT | Freq: Three times a day (TID) | CUTANEOUS | 0 refills | Status: AC | PRN

## 2023-08-13 NOTE — Telephone Encounter (Signed)
Called pt was advised per Dr. Carmelia Roller has sent in  new medication, topical which should help more with the pain. Pt stated understand.

## 2023-08-13 NOTE — Telephone Encounter (Signed)
Copied from CRM [id number]. Topic: Clinical - Medication Refill >> Aug 13, 2023  9:22 AM Almira Coaster wrote: Most Recent Primary Care Visit:  Provider: Sharlene Dory  Department: [facility]  Visit Type: OFFICE VISIT  Date: 07/21/2023  Medication: HYDROcodone-acetaminophen (NORCO) 5-325 MG tablet   Has the patient contacted their pharmacy? No, per patient this medication needs to be called in everytime. (Agent: If no, request that the patient contact the pharmacy for the refill. If patient does not wish to contact the pharmacy document the reason why and proceed with request.) (Agent: If yes, when and what did the pharmacy advise?)  Is this the correct pharmacy for this prescription? Yes If no, delete pharmacy and type the correct one.  This is the patient's preferred pharmacy:   [id number] [place], [place] - [address] [place] [address] Phone: [phone] Fax: [phone]   Has the prescription been filled recently? Yes  Is the patient out of the medication? Yes  Has the patient been seen for an appointment in the last year OR does the patient have an upcoming appointment? Yes  Can we respond through MyChart? No, patient prefers a phone call.  Agent: Please be advised that Rx refills may take up to 3 business days. We ask that you follow-up with your pharmacy.

## 2023-09-06 ENCOUNTER — Ambulatory Visit: Payer: Self-pay | Admitting: Surgery

## 2023-09-06 DIAGNOSIS — K643 Fourth degree hemorrhoids: Secondary | ICD-10-CM | POA: Diagnosis not present

## 2023-09-06 DIAGNOSIS — Z86718 Personal history of other venous thrombosis and embolism: Secondary | ICD-10-CM | POA: Diagnosis not present

## 2023-09-06 DIAGNOSIS — Z7901 Long term (current) use of anticoagulants: Secondary | ICD-10-CM | POA: Diagnosis not present

## 2023-09-06 DIAGNOSIS — K6289 Other specified diseases of anus and rectum: Secondary | ICD-10-CM | POA: Diagnosis not present

## 2023-09-07 DIAGNOSIS — Z9089 Acquired absence of other organs: Secondary | ICD-10-CM | POA: Diagnosis not present

## 2023-09-07 DIAGNOSIS — H906 Mixed conductive and sensorineural hearing loss, bilateral: Secondary | ICD-10-CM | POA: Diagnosis not present

## 2023-09-07 DIAGNOSIS — H7293 Unspecified perforation of tympanic membrane, bilateral: Secondary | ICD-10-CM | POA: Diagnosis not present

## 2023-09-07 DIAGNOSIS — H9212 Otorrhea, left ear: Secondary | ICD-10-CM | POA: Diagnosis not present

## 2023-09-08 ENCOUNTER — Telehealth: Payer: Self-pay

## 2023-09-08 NOTE — Telephone Encounter (Signed)
 Called pt lvm letting her know she will need office visit  For surgery clearance. Need to call and schedule visit.

## 2023-09-10 ENCOUNTER — Ambulatory Visit: Payer: Self-pay | Admitting: Family Medicine

## 2023-09-10 NOTE — Telephone Encounter (Signed)
  Chief Complaint: Scheduling needed for med clearance Disposition: [] ED /[] Urgent Care (no appt availability in office) / [] Appointment(In office/virtual)/ []  [facility]/ [] Home Care/ [] Refused Recommended Disposition /[] [facility]/ []  Follow-up with PCP Additional Notes: Scheduled according to telephone note dated 09/08/23 for 09/14/23 at 1515 per patient request.   Copied from CRM #[id number]. Topic: Clinical - Red Word Triage >> Sep 10, 2023  2:11 PM Samuel Jester B wrote: [facility] that prompted transfer to Nurse Triage: Pt stated that she has anxiety, swelling, and nauseous, she is also needing to schedule an appointment for her upcoming surgery. Reason for Disposition  General information question, no triage required and triager able to answer question  Answer Assessment - Initial Assessment Questions 1. REASON FOR CALL or QUESTION: "What is your reason for calling today?" or "How can I best help you?" or "What question do you have that I can help answer?"     Patient returning call stating she received a message to call and schedule an appt for medical clearance for upcoming procedure. This RN contacted CAL for scheduling directive, patient scheduled accordingly. Clarified with patient other topics noted in CRM, she states this is not something she wishes to be triaged for, it is ongoing, and she was only calling back for scheduling.  Protocols used: Information Only Call - No Triage-A-AH

## 2023-09-14 ENCOUNTER — Encounter: Payer: Self-pay | Admitting: Family Medicine

## 2023-09-14 ENCOUNTER — Ambulatory Visit: Payer: BLUE CROSS/BLUE SHIELD | Admitting: Family Medicine

## 2023-09-14 VITALS — BP 132/84 | HR 75 | Temp 98.0°F | Resp 16 | Ht 65.0 in | Wt 154.0 lb

## 2023-09-14 DIAGNOSIS — R062 Wheezing: Secondary | ICD-10-CM

## 2023-09-14 DIAGNOSIS — Z01818 Encounter for other preprocedural examination: Secondary | ICD-10-CM | POA: Diagnosis not present

## 2023-09-14 MED ORDER — METHYLPREDNISOLONE ACETATE 80 MG/ML IJ SUSP
80.0000 mg | Freq: Once | INTRAMUSCULAR | Status: AC
  Administered 2023-09-14: 80 mg via INTRAMUSCULAR

## 2023-09-14 NOTE — Addendum Note (Signed)
Addended by: Thelma Barge D on: 09/14/2023 05:48 PM   Modules accepted: Orders

## 2023-09-14 NOTE — Patient Instructions (Addendum)
Give [place] 2-3 business days to get the results of your labs back.   Hold Xarelto for 2 days prior to your procedure.   Let us know if you need anything.

## 2023-09-14 NOTE — Progress Notes (Signed)
Subjective:   Chief Complaint  Patient presents with   Medical Clearance    Medical Clearance    [name]  is here for a Pre-operative physical at the request of Dr. Michaell Cowing.   She  is having hemorrhoidectomy surgery on a date yet to be determined for painful external hemorrhoids.  Personal or family hx of adverse outcome to anesthesia? No  Chipped, cracked, missing, or loose teeth? No  Decreased ROM of neck? No  Able to walk up 2 flights of stairs without becoming significantly short of breath or having chest pain? Unsure; walks without shortness of breath  Revised Goldman Criteria: High Risk Surgery (intraperitoneal, intrathoracic, aortic): No  Ischemic heart disease (Prior MI, +excercise stress test, angina, nitrate use, Qwave):  No; daughter gets grumpy History of heart failure: No  History of cerebrovascular disease: No  History of diabetes: No  Insulin therapy for DM: No  Preoperative Cr >2.0: No   Patient Active Problem List   Diagnosis Date Noted   Dog bite 02/27/2022   Hyponatremia 02/27/2022   Thrombocytosis 02/27/2022   Macrocytic anemia 02/27/2022   Hypothyroidism 02/27/2022   History of DVT (deep vein thrombosis) 02/27/2022   Bipolar disorder (HCC) 02/27/2022   Wound, open, arm, forearm, left, subsequent encounter 02/26/2022   Elbow effusion, left 02/16/2022   Closed fracture of one rib of left side 02/16/2022   Dog bite of right foot 02/16/2022   Closed fracture of left distal tibia 02/16/2022   Frequent falls 09/22/2021   Myofascial pain dysfunction syndrome 04/05/2020   Cervical subluxation 01/30/2020   Chronic otitis externa of right ear 07/27/2019   Dysfunction of right eustachian tube 07/27/2019   Chronic mycotic otitis externa 07/13/2019   Dizziness 10/13/2018   History of mastoiditis 10/13/2018   Vasculitis of skin 08/22/2018   Cervical radiculopathy 06/13/2018   GAD (generalized anxiety disorder) 03/09/2018   Mixed conductive and sensorineural  hearing loss of both ears 09/16/2017   Chronic mastoiditis of left side 05/27/2017   Fibromyalgia 06/19/2016   Spondylosis of cervical joint 02/21/2016   Depression 04/09/2015   Gastritis 04/09/2015   Uncomplicated alcohol dependence (HCC) 04/08/2015   Hypertension 08/27/2014   Allodynia 05/17/2013   Chest pain syndrome 04/28/2013   Mild HOCM (hypertrophic obstructive cardiomyopathy) (HCC) 04/28/2013   Palpitations 04/28/2013   Past Medical History:  Diagnosis Date   Back injury    Chronic   Bipolar disorder (HCC)    Cardiomyopathy (HCC)    hypertension controlled-helped with edema-Dr Nahser   Deafness    Left ear   Degenerative joint disease    with neck surgery   Depression    Diastolic dysfunction    with possible mild LVOT gradient   Family history of adverse reaction to anesthesia    " my daughter has PONV and gets very grumpy"   Fibromyalgia    Former cigarette smoker    Gestational diabetes    H/O blood clots    due to side effect of medication   Headache(784.0)    migraines   Hypertension    Hypothyroidism    Leg cramps    Neuropathy    Pain    chronic    PTSD (post-traumatic stress disorder)    Respiratory disorder    Schizophrenia (HCC)     Past Surgical History:  Procedure Laterality Date   ABDOMINAL HYSTERECTOMY     partial   ANTERIOR CERVICAL DECOMP/DISCECTOMY FUSION N/A 01/30/2020   Procedure: Cervical Three-FourAnterior cervical decompression/discectomy/fusion with Hardware Removal.;  Surgeon: Barnett Abu, MD;  Location: [facility] OR;  Service: Neurosurgery;  Laterality: N/A;  anterior   APPENDECTOMY     CARDIAC CATHETERIZATION     Ejection Fraction 65-70%   CERVICAL DISC ARTHROPLASTY N/A 02/21/2016   Procedure: Cervical three-four artificial disc replacement;  Surgeon: Barnett Abu, MD;  Location: [facility];  Service: Neurosurgery;  Laterality: N/A;   CERVICAL FUSION     C5-6   EXTERNAL EAR SURGERY     tumor at age 50, 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    HERNIA REPAIR     " as a baby"   I & D EXTREMITY Left 02/27/2022   Procedure: IRRIGATION AND DEBRIDEMENT FOREARM;  Surgeon: Betha Loa, MD;  Location: [facility] OR;  Service: Orthopedics;  Laterality: Left;   NECK SURGERY     OTHER SURGICAL HISTORY     History of cervical and lumbar disk surgery   PERCUTANEOUS PINNING Left 12/01/2019   Procedure: Closed reduction percutaneous pinning left ring finger proximal phalanx fracture;  Surgeon: Allena Napoleon, MD;  Location: [facility] OR;  Service: Plastics;  Laterality: Left;   polypectomies     tubes   POSTERIOR CERVICAL LAMINECTOMY WITH MET- RX Left 06/13/2018   Procedure: Left Cervical three-four Laminotomy/foraminotomy;  Surgeon: Barnett Abu, MD;  Location: [facility] OR;  Service: Neurosurgery;  Laterality: Left;   TONSILLECTOMY     TONSILLECTOMY AND ADENOIDECTOMY      Current Outpatient Medications  Medication Sig Dispense Refill   ALPRAZolam (XANAX) 1 MG tablet Take 1 tablet (1 mg total) by mouth in the morning and at bedtime. 60 tablet 2   baclofen (LIORESAL) 10 MG tablet Take 1 tablet (10 mg total) by mouth 3 (three) times daily. With Zanaflex- for muscle spasms 90 each 5   bisacodyl (DULCOLAX) 5 MG EC tablet Take 5 mg by mouth 2 (two) times daily as needed for moderate constipation.      busPIRone (BUSPAR) 15 MG tablet Take 1 tablet (15 mg total) by mouth 3 (three) times daily. 270 tablet 3   citalopram (CELEXA) 20 MG tablet Take 1 tablet (20 mg total) by mouth daily. 90 tablet 3   clotrimazole-betamethasone (LOTRISONE) cream APPLY 1 APPLICATION TOPICALLY DAILY 30 g 0   dexamethasone (DECADRON) 0.1 % ophthalmic solution 2 drops See admin instructions. Instill into right ear as needed for ear infection     hydrochlorothiazide (HYDRODIURIL) 25 MG tablet TAKE 1 TABLET (25 MG TOTAL) BY MOUTH DAILY. 90 tablet 1   HYDROcodone-acetaminophen (NORCO) 5-325 MG tablet Take 1 tablet by mouth every 8 (eight) hours as needed for moderate pain (pain score 4-6). 30 tablet 0    hydrocortisone (ANUSOL-HC) 25 MG suppository Place 1 suppository (25 mg total) rectally 2 (two) times daily. 24 suppository 0   hydrOXYzine (ATARAX/VISTARIL) 25 MG tablet TAKE 3 TO 4 TABLETS BY MOUTH EVERY 8 HOURS AS NEEDED FOR ANXIETY. MAX AMOUNT PER INSURANCE (Patient taking differently: Take 25 mg by mouth daily.) 360 tablet 2   levothyroxine (SYNTHROID) 50 MCG tablet TAKE 1 TABLET BY MOUTH EVERY DAY 90 tablet 0   lidocaine (XYLOCAINE) 5 % ointment Apply 1 Application topically 3 (three) times daily as needed for moderate pain (pain score 4-6). 35.44 g 0   loratadine (CLARITIN) 10 MG tablet Take 10 mg by mouth 2 (two) times daily as needed for allergies (for seasonal allergies).     meclizine (ANTIVERT) 25 MG tablet Take 1 tablet (25 mg total) by mouth 3 (three) times daily as needed  for dizziness. 30 tablet 3   metoprolol tartrate (LOPRESSOR) 25 MG tablet TAKE 1 TABLET BY MOUTH TWICE A DAY 30 tablet 0   naproxen (NAPROSYN) 500 MG tablet TAKE 1 TABLET DAILY AS NEEDED FOR PAIN. USE SPARINGLY. 30 tablet 0   Nitroglycerin 0.4 % OINT Apply 1 inch (375 mg) ointment intra-anally every 12 hours for anal fissure 30 g 0   risperiDONE (RISPERDAL) 1 MG tablet TAKE 1 TABLET BY MOUTH EVERYDAY AT BEDTIME 90 tablet 3   tiZANidine (ZANAFLEX) 4 MG tablet Take 2 tablets (8 mg total) by mouth 3 (three) times daily. TAKE ONE AND ONE HALF TO TWO TABLETS BY MOUTH THREE TIMES DAILY 180 tablet 5   triamcinolone cream (KENALOG) 0.1 % Apply 1 Application topically 2 (two) times daily. 30 g 0   XARELTO 20 MG TABS tablet TAKE 1 TABLET BY MOUTH DAILY WITH SUPPER. 30 tablet 3   Allergies  Allergen Reactions   Morphine And Codeine Other (See Comments)    "Didn't agree with me/wasn't very effective.."   Lyrica [Pregabalin] Rash   Neurontin [Gabapentin] Other (See Comments)    Left patient sleepy all the time    Family History  Problem Relation Age of Onset   Coronary artery disease Father    Parkinson's disease  Mother    Cancer Sister        breast   Diabetes Sister    Diabetes Brother      Review of Systems:  Constitutional:  no fevers Eye:  no recent significant change in vision Ear:  no hearing loss Nose/Mouth/Throat:  No dental complaints Neck/Thyroid:  no lumps or masses Pulmonary:  No shortness of breath Cardiovascular:  no chest pain Gastrointestinal:  no abdominal pain GU:  negative for dysuria Musculoskeletal/Extremities:  no pain Skin/Integumentary ROS:  no abnormal skin lesions reported Neurologic:  no HA   Objective:   Vitals:   09/14/23 1519  BP: 132/84  Pulse: 75  Resp: 16  Temp: 98 F (36.7 C)  TempSrc: Oral  SpO2: 96%  Weight: 154 lb (69.9 kg)  Height: 5\' 5"  (1.651 m)   Body mass index is 25.63 kg/m.  General:  well developed, well nourished, in no apparent distress Skin:  warm, no pallor or diaphoresis Head:  normocephalic, atraumatic Eyes:  pupils equal and round, sclera anicteric without injection Ears:  canals without lesions, TMs shiny without retraction, no obvious effusion, no erythema Throat/Pharynx:  lips and gingiva without lesion; tongue and uvula midline; non-inflamed pharynx; no exudates or postnasal drainage Neck: neck supple without adenopathy, thyromegaly, or masses, no bruits, no jugular venous distention Lungs: Diffuse expiratory wheezing, no respiratory distress Cardio:  regular rate and rhythm without murmurs Abdomen:  abdomen soft, nontender; bowel sounds normal; no masses, hepatomegaly or splenomegaly Musculoskeletal:  symmetrical muscle groups noted without atrophy or deformity Extremities:  no clubbing, cyanosis, or edema, no deformities, no skin discoloration Neuro:  gait normal; deep tendon reflexes normal and symmetric and alert and oriented to person, place, and time Psych: Age appropriate judgment and insight; normal mood   Assessment:   Pre-op evaluation - Plan: Basic metabolic panel, CBC  Wheezing - Plan:  methylPREDNISolone acetate (DEPO-MEDROL) injection 80 mg   Plan:   Check labs.  She will hold Xarelto for 2 days prior to the procedure.  She is having some wheezing today.  She is cardiovascularly optimized.  I think the cold there is flared her lungs, she is a smoker and likely has emphysema.  Will  give a steroid injection today and see if she clears up by tomorrow.  If she remains wheeze-free, will deem her medically optimized for the proposed procedure.  She will return tomorrow so I can listen to her.  The patient voiced understanding and agreement to the plan.  Jilda Roche [place], DO 09/14/23  4:31 PM

## 2023-09-15 LAB — BASIC METABOLIC PANEL
BUN/Creatinine Ratio: 9 (calc) (ref 6–22)
BUN: 6 mg/dL — ABNORMAL LOW (ref 7–25)
CO2: 31 mmol/L (ref 20–32)
Calcium: 9 mg/dL (ref 8.6–10.4)
Chloride: 86 mmol/L — ABNORMAL LOW (ref 98–110)
Creat: 0.69 mg/dL (ref 0.50–1.05)
Glucose, Bld: 89 mg/dL (ref 65–99)
Potassium: 3.8 mmol/L (ref 3.5–5.3)
Sodium: 129 mmol/L — ABNORMAL LOW (ref 135–146)

## 2023-09-15 LAB — CBC
HCT: 41.8 % (ref 35.0–45.0)
Hemoglobin: 14.2 g/dL (ref 11.7–15.5)
MCH: 34.1 pg — ABNORMAL HIGH (ref 27.0–33.0)
MCHC: 34 g/dL (ref 32.0–36.0)
MCV: 100.2 fL — ABNORMAL HIGH (ref 80.0–100.0)
MPV: 8.8 fL (ref 7.5–12.5)
Platelets: 382 10*3/uL (ref 140–400)
RBC: 4.17 10*6/uL (ref 3.80–5.10)
RDW: 12.7 % (ref 11.0–15.0)
WBC: 8 10*3/uL (ref 3.8–10.8)

## 2023-09-16 ENCOUNTER — Other Ambulatory Visit: Payer: Self-pay | Admitting: Adult Health

## 2023-09-16 DIAGNOSIS — F411 Generalized anxiety disorder: Secondary | ICD-10-CM

## 2023-09-16 NOTE — Telephone Encounter (Signed)
LF 12/26 LV 10/4 NV 04/3

## 2023-09-21 ENCOUNTER — Other Ambulatory Visit: Payer: Self-pay | Admitting: Physical Medicine and Rehabilitation

## 2023-09-22 ENCOUNTER — Other Ambulatory Visit: Payer: Self-pay | Admitting: *Deleted

## 2023-09-27 ENCOUNTER — Other Ambulatory Visit: Payer: Self-pay | Admitting: Adult Health

## 2023-09-27 ENCOUNTER — Ambulatory Visit: Payer: BLUE CROSS/BLUE SHIELD | Admitting: Physical Medicine and Rehabilitation

## 2023-09-27 DIAGNOSIS — F411 Generalized anxiety disorder: Secondary | ICD-10-CM

## 2023-09-29 ENCOUNTER — Other Ambulatory Visit: Payer: Self-pay | Admitting: Family Medicine

## 2023-10-01 ENCOUNTER — Encounter
Payer: BLUE CROSS/BLUE SHIELD | Attending: Physical Medicine and Rehabilitation | Admitting: Physical Medicine and Rehabilitation

## 2023-10-01 ENCOUNTER — Ambulatory Visit: Payer: Self-pay | Admitting: Surgery

## 2023-10-01 ENCOUNTER — Encounter: Payer: Self-pay | Admitting: Physical Medicine and Rehabilitation

## 2023-10-01 VITALS — BP 130/84 | HR 65 | Ht 65.0 in | Wt 150.6 lb

## 2023-10-01 DIAGNOSIS — M7918 Myalgia, other site: Secondary | ICD-10-CM | POA: Insufficient documentation

## 2023-10-01 DIAGNOSIS — M5412 Radiculopathy, cervical region: Secondary | ICD-10-CM | POA: Insufficient documentation

## 2023-10-01 MED ORDER — LIDOCAINE HCL 1 % IJ SOLN
3.0000 mL | Freq: Once | INTRAMUSCULAR | Status: AC
  Administered 2023-10-01: 3 mL

## 2023-10-01 MED ORDER — TIZANIDINE HCL 4 MG PO TABS: 8.0000 mg | ORAL_TABLET | Freq: Three times a day (TID) | ORAL | 1 refills | Status: DC

## 2023-10-01 MED ORDER — BACLOFEN 10 MG PO TABS: 10.0000 mg | ORAL_TABLET | Freq: Three times a day (TID) | ORAL | 1 refills | Status: DC

## 2023-10-01 NOTE — Progress Notes (Signed)
 Patient is a 63 yr old female with hx of multiple spinal surgeries- all surgeries on neck and neck and back pain, fibromyalgia, and previous L carpal tunnel surgery- also hx of bipolarII d/o;  Here for trigger point injections and f/u on chronic pain.        Has surgery scheduled- with  GI?- Dr Sheldon- 10/14/23- for hemorrhoids- pretty bad.   Having major stress lately.  Celebrating 42 years on birthday of marriage.   Needs shots  General surgery- gave her grief for smoking.    Plan: .will refill Zanaflex  8 mg TID and Baclofen  10 mg TID- with 3 months supply- 1 refill  2. Patient here for trigger point injections for  Consent done and on chart.  Cleaned areas with alcohol and injected using a 27 gauge 1.5 inch needle  Injected 3cc none wasted Using 1% Lidocaine  with no EPI  Upper traps- B/L x2 Levators- B/L  x3 Posterior scalenes Middle scalenes- B/L  Splenius Capitus- B/L  Pectoralis Major Rhomboids Infraspinatus Teres Major/minor Thoracic paraspinals Lumbar paraspinals Other injections-     There was no bleeding or complications.  Patient was advised to drink a lot of water on day after injections to flush system Will have increased soreness for 12-48 hours after injections.  Can use Lidocaine  patches the day AFTER injections Can use theracane on day of injections in places didn't inject Can use heating pad 4-6 hours AFTER injections    3. F/U in 6 weeks- trigger point injections and f/u on pain

## 2023-10-01 NOTE — Patient Instructions (Signed)
 Plan: .will refill Zanaflex  8 mg TID and Baclofen  10 mg TID- with 3 months supply- 1 refill  2. Patient here for trigger point injections for  Consent done and on chart.  Cleaned areas with alcohol and injected using a 27 gauge 1.5 inch needle  Injected 3cc none wasted Using 1% Lidocaine  with no EPI  Upper traps- B/L x2 Levators- B/L  x3 Posterior scalenes Middle scalenes- B/L  Splenius Capitus- B/L  Pectoralis Major Rhomboids Infraspinatus Teres Major/minor Thoracic paraspinals Lumbar paraspinals Other injections-     There was no bleeding or complications.  Patient was advised to drink a lot of water on day after injections to flush system Will have increased soreness for 12-48 hours after injections.  Can use Lidocaine  patches the day AFTER injections Can use theracane on day of injections in places didn't inject Can use heating pad 4-6 hours AFTER injections    3. F/U in 6 weeks- trigger point injections and f/u on pain

## 2023-10-05 ENCOUNTER — Other Ambulatory Visit: Payer: Self-pay

## 2023-10-05 ENCOUNTER — Encounter (HOSPITAL_COMMUNITY)
Admission: RE | Admit: 2023-10-05 | Discharge: 2023-10-05 | Disposition: A | Discharge status: Home / Self Care | Payer: BLUE CROSS/BLUE SHIELD | Source: Ambulatory Visit | Attending: Surgery | Admitting: Surgery

## 2023-10-05 ENCOUNTER — Encounter (HOSPITAL_COMMUNITY): Payer: Self-pay

## 2023-10-05 VITALS — BP 179/83 | HR 97 | Temp 97.7°F | Resp 17 | Ht 65.0 in | Wt 145.0 lb

## 2023-10-05 DIAGNOSIS — Z01810 Encounter for preprocedural cardiovascular examination: Secondary | ICD-10-CM | POA: Diagnosis not present

## 2023-10-05 DIAGNOSIS — Z01818 Encounter for other preprocedural examination: Secondary | ICD-10-CM | POA: Insufficient documentation

## 2023-10-05 DIAGNOSIS — R9431 Abnormal electrocardiogram [ECG] [EKG]: Secondary | ICD-10-CM | POA: Insufficient documentation

## 2023-10-05 DIAGNOSIS — Z01812 Encounter for preprocedural laboratory examination: Secondary | ICD-10-CM | POA: Diagnosis not present

## 2023-10-05 HISTORY — DX: Unspecified diabetes mellitus in pregnancy, unspecified trimester: O24.919

## 2023-10-05 HISTORY — DX: Unspecified asthma, uncomplicated: J45.909

## 2023-10-05 LAB — BASIC METABOLIC PANEL
Anion gap: 12 (ref 5–15)
BUN: 5 mg/dL — ABNORMAL LOW (ref 6–20)
CO2: 29 mmol/L (ref 22–32)
Calcium: 9.4 mg/dL (ref 8.9–10.3)
Chloride: 87 mmol/L — ABNORMAL LOW (ref 98–111)
Creatinine, Ser: 0.61 mg/dL (ref 0.44–1.00)
GFR, Estimated: >60 mL/min (ref >60)
Glucose, Bld: 113 mg/dL — ABNORMAL HIGH (ref 70–99)
Potassium: 3.8 mmol/L (ref 3.5–5.1)
Sodium: 128 mmol/L — ABNORMAL LOW (ref 135–145)

## 2023-10-05 NOTE — Progress Notes (Addendum)
Anesthesia Review:DVT, HTN, LBBB on EKG Jessica Ward PA-C aware at preop, Na 129 on labs 09-14-23 repeated at preop, NA 128 at preop  PCP: Arva Chafe , DO Preop eval on 09/14/23  Cardiologist :Dr. Lisa Roca 05-29-2019   see's as needed  CBC 09-14-23 epic Chest x-ray : EKG : Echo : 2020  Stress test: Cardiac Cath :  Cbc, bmp 1-2 Activity level:  Able to climb a flight of stairs without CP or SOB  Sleep Study/ CPAP : Fasting Blood Sugar :      / Checks Blood Sugar -- times a day:   Blood Thinner/ Instructions /Last Dose:Last dose Xarelto 10-11-23  will take at  10:00am  ASA / Instructions/ Last Dose :    Xarelto- 2 days prior

## 2023-10-07 ENCOUNTER — Telehealth: Payer: Self-pay | Admitting: *Deleted

## 2023-10-07 NOTE — Telephone Encounter (Signed)
I will send FYI to requesting office pt will need new pt appt, last seen 2020.

## 2023-10-07 NOTE — Telephone Encounter (Signed)
   Pre-operative Risk Assessment    Patient Name: [name]  DOB: [birth year] MRN: [id number]   Date of last office visit: 05/29/2019 DR. NAHSER Date of next office visit: NONE- PT IS GOING TO NEED A NEW PT APPT   Request for Surgical Clearance    Procedure:   HEMORRHOIDECTOMY   Date of Surgery:  Clearance 10/14/23                                Surgeon:  DR. Karie Soda Surgeon's Group or Practice Name:  [facility] Phone number:  [phone] Fax number:  [phone] Ethlyn Gallery, CMA   Type of Clearance Requested:   - Medical  - Pharmacy:  Hold Rivaroxaban (Xarelto) /PER CLEARANCE NOTES PT HAD ABNORMAL EKG, SHOWING LBBB   Type of Anesthesia:  General    Additional requests/questions:    Elpidio Anis   10/07/2023, 5:13 PM

## 2023-10-08 NOTE — Telephone Encounter (Signed)
I will update all parties the pt has new pt appt for preop clearance with Dr. Royann Shivers 10/12/23.

## 2023-10-12 ENCOUNTER — Encounter: Payer: Self-pay | Admitting: Cardiovascular Disease

## 2023-10-12 ENCOUNTER — Ambulatory Visit: Payer: BLUE CROSS/BLUE SHIELD | Attending: Cardiovascular Disease | Admitting: Cardiovascular Disease

## 2023-10-12 ENCOUNTER — Other Ambulatory Visit: Payer: Self-pay | Admitting: Cardiovascular Disease

## 2023-10-12 VITALS — BP 139/80 | HR 67 | Ht 65.0 in | Wt 144.0 lb

## 2023-10-12 DIAGNOSIS — I10 Essential (primary) hypertension: Secondary | ICD-10-CM | POA: Diagnosis not present

## 2023-10-12 DIAGNOSIS — Z01810 Encounter for preprocedural cardiovascular examination: Secondary | ICD-10-CM | POA: Diagnosis not present

## 2023-10-12 DIAGNOSIS — I447 Left bundle-branch block, unspecified: Secondary | ICD-10-CM | POA: Diagnosis not present

## 2023-10-12 DIAGNOSIS — R079 Chest pain, unspecified: Secondary | ICD-10-CM

## 2023-10-12 NOTE — Patient Instructions (Addendum)
Medication Instructions:  Your physician recommends that you continue on your current medications as directed. Please refer to the Current Medication list given to you today.    *If you need a refill on your cardiac medications before your next appointment, please call your pharmacy*   Lab Work: NONE    If you have labs (blood work) drawn today and your tests are completely normal, you will receive your results only by: MyChart Message (if you have MyChart) OR A paper copy in the mail If you have any lab test that is abnormal or we need to change your treatment, we will call you to review the results.   Testing/Procedures: Echo will be scheduled at [address].  Your physician has requested that you have an echocardiogram. Echocardiography is a painless test that uses sound waves to create images of your heart. It provides your doctor with information about the size and shape of your heart and how well your heart's chambers and valves are working. This procedure takes approximately one hour. There are no restrictions for this procedure. Please do NOT wear cologne, perfume, aftershave, or lotions (deodorant is allowed). Please arrive 15 minutes prior to your appointment time.    Follow-Up: At [facility], you and your health needs are our priority.  As part of our continuing mission to provide you with exceptional heart care, we have created designated Provider Care Teams.  These Care Teams include your primary Cardiologist (physician) and Advanced Practice Providers (APPs -  Physician Assistants and Nurse Practitioners) who all work together to provide you with the care you need, when you need it.  We recommend signing up for the patient portal called "MyChart".  Sign up information is provided on this After Visit Summary.  MyChart is used to connect with patients for Virtual Visits (Telemedicine).  Patients are able to view lab/test results, encounter notes, upcoming  appointments, etc.  Non-urgent messages can be sent to your provider as well.   To learn more about what you can do with MyChart, go to [email or web address].    Your next appointment:   1 year(s)  The format for your next appointment:   In Person  Provider:   Dr. Royann Shivers    Other Instructions   Dr. Royann Shivers recommends you get a smart watch or kardiamobile device.   Kardia Mobile [employer]: Website: [email or web address]  DR. Royann Shivers RECOMMENDS YOU PURCHASE  " Kardia" [employer] FROM THE  [employer]  APP PLAY STORE.  THE APP IS FREE , BUT THE  EQUIPMENT HAS A COST. IT ALLOWS YOU TO OBTAIN A RECORDING OF YOUR HEART RATE AND RHYTHM BY PROVIDING A SHORT STRIP THAT YOU CAN SHARE WITH YOUR PROVIDER.     [facility] - sending an EKG Download app and set up profile. Run EKG - by placing 1-2 fingers on the silver plates After EKG is complete - Download PDF  - Skip password (if you apply a password the provider will need it to view the EKG) Click share button (square with upward arrow) in bottom left corner To send: choose MyChart (first time log into MyChart)  Pop up window about sending ECG Click continue Choose type of message Choose provider Type subject and message Click send (EKG should be attached)  - To send additional EKGs in one message click the paperclip image and bottom of page to attach.

## 2023-10-13 ENCOUNTER — Encounter: Payer: Self-pay | Admitting: Cardiovascular Disease

## 2023-10-13 NOTE — Telephone Encounter (Signed)
[name] calling to follow up clearance, procedure is tomorrow and they are closing early today.

## 2023-10-13 NOTE — Telephone Encounter (Signed)
Good morning ladies,   Thank you ladies for the update. Be safe in the upcoming weather.   Thank you  Okey Regal  ===View-only below this line=== ----- Message ----- From: Reinaldo Meeker Sent: 10/13/2023  10:13 AM EST To: Ethlyn Gallery; Tarri Fuller, CMA Subject: Cardiac Clearance found under letters by Dr Lorain Childes,  We found the cardiac clearance note from Dr Royann Shivers under letters from the cardiologist.  Edison Pace, Triage

## 2023-10-13 NOTE — Telephone Encounter (Signed)
 I will forward to preop APP to review if the pt has been cleared.

## 2023-10-13 NOTE — Anesthesia Preprocedure Evaluation (Signed)
Anesthesia Evaluation  Patient identified by MRN, date of birth, ID band Patient awake    Reviewed: Allergy & Precautions, H&P , NPO status , Patient's Chart, lab work & pertinent test results, reviewed documented beta blocker date and time   History of Anesthesia Complications (+) Family history of anesthesia reaction  Airway Mallampati: II  TM Distance: >3 FB Neck ROM: Limited    Dental no notable dental hx. (+) Poor Dentition, Chipped, Missing, Dental Advisory Given,    Pulmonary asthma , Current Smoker and Patient abstained from smoking.   Pulmonary exam normal breath sounds clear to auscultation       Cardiovascular Exercise Tolerance: Good hypertension, Pt. on medications and Pt. on home beta blockers Normal cardiovascular exam+ dysrhythmias Atrial Fibrillation  Rhythm:Regular Rate:Normal  1. Left ventricular ejection fraction, by visual estimation, is 60 to 65%. The left ventricle has normal function. Normal left ventricular size. Left ventricular septal wall thickness was normal. Normal left ventricular posterior wall thickness. There is no left ventricular hypertrophy. 2. Global right ventricle has normal systolic function.The right ventricular size is normal. No increase in right ventricular wall thickness. 3. Left atrial size was normal. 4. Right atrial size was normal. 5. Presence of pericardial fat pad. 6. The mitral valve is normal in structure. No evidence of mitral valve regurgitation. No evidence of mitral stenosis. 7. The tricuspid valve is normal in structure. Tricuspid valve regurgitation was not visualized by color flow Doppler. 8. The aortic valve is normal in structure. Aortic valve regurgitation was not visualized by color flow Doppler. Structurally normal aortic valve, with no evidence of sclerosis or stenosis. 9. The pulmonic valve was normal in structure. Pulmonic valve regurgitation is not  visualized by color flow Doppler. 10. TR signal is inadequate   Neuro/Psych  Headaches PSYCHIATRIC DISORDERS Anxiety Depression Bipolar Disorder Schizophrenia   Neuromuscular disease    GI/Hepatic negative GI ROS, Neg liver ROS,,,  Endo/Other  negative endocrine ROSdiabetesHypothyroidism    Renal/GU negative Renal ROS  negative genitourinary   Musculoskeletal  (+) Arthritis , Osteoarthritis,  Fibromyalgia -  Abdominal   Peds  Hematology  (+) Blood dyscrasia, anemia   Anesthesia Other Findings   Reproductive/Obstetrics negative OB ROS                             Anesthesia Physical Anesthesia Plan  ASA: 3  Anesthesia Plan: General   Post-op Pain Management: Tylenol PO (pre-op)* and Celebrex PO (pre-op)*   Induction: Intravenous  PONV Risk Score and Plan: 3 and Ondansetron, Dexamethasone, Midazolam and Treatment may vary due to age or medical condition  Airway Management Planned: Oral ETT  Additional Equipment: None  Intra-op Plan:   Post-operative Plan: Extubation in OR  Informed Consent: I have reviewed the patients History and Physical, chart, labs and discussed the procedure including the risks, benefits and alternatives for the proposed anesthesia with the patient or authorized representative who has indicated his/her understanding and acceptance.     Dental Advisory Given  Plan Discussed with: CRNA and Anesthesiologist  Anesthesia Plan Comments: (See PAT note 10/05/2023)        Anesthesia Quick Evaluation

## 2023-10-13 NOTE — Progress Notes (Signed)
Cardiology Office Note:    Date:  10/13/2023   ID:  [name], DOB 1960/11/21, MRN [id number]  PCP:  Sharlene Dory, DO   [facility] Providers Cardiologist:  Kristeen Miss, MD     Referring MD: Sharlene Dory*   Chief Complaint  Patient presents with   New Patient (Initial Visit)  [name] is a 63 y.o. female who is being seen today for the evaluation of LBBB at the request of Sharlene Dory*.   History of Present Illness:    [name] is a 63 y.o. female with a hx of unprovoked DVT of the lower extremity on lifelong anticoagulation, gestational diabetes, controlled hypertension, treated hypothyroidism, here for cardiac evaluation since her preoperative ECG showed a new left bundle branch block.  She is scheduled for hemorrhoidectomy with Dr. Michaell Cowing on 10/14/2023.  She has no cardiovascular complaints.  She goes for a 30-minute walk [place] daily basis.  The patient specifically denies any chest pain at rest or with exertion, dyspnea at rest or with exertion, orthopnea, paroxysmal nocturnal dyspnea, syncope, palpitations, focal neurological deficits, intermittent claudication, lower extremity edema, unexplained weight gain, cough, hemoptysis or wheezing.  Her ECG on 10/05/2023 shows mild sinus tachycardia at 105 bpm and left bundle branch block with a QRS duration of 130 ms.  Previous ECG from 2020 showed sinus rhythm with anterolateral ST segment depression and T wave inversion, repolarization abnormalities which have been present in various degrees on ECGs from 2015-2019 as well.  Echocardiograms in 2014 in 2020 showed normal left ventricular systolic function with a EF of 60 to 65% and no valvular abnormalities.  The study from 2014 commented on mild LVH (posterior wall 12.5 mm), but theEcho from September 2020.  Her brother had a pacemaker around the age of 40.  There is no family history of coronary  disease or other premature vascular disorders.  She smokes a few cigarettes a day and is trying to quit.  Past Medical History:  Diagnosis Date   Asthma    seasonal   Back injury    Chronic   Bipolar disorder (HCC)    Cardiomyopathy (HCC)    hypertension controlled-helped with edema-Dr Nahser   Deafness    Left ear   Degenerative joint disease    with neck surgery   Depression    Diabetes  during pregnancy    Diastolic dysfunction    with possible mild LVOT gradient   Family history of adverse reaction to anesthesia    " my daughter has PONV and gets very grumpy"   Fibromyalgia    neuropathy   Former cigarette smoker    H/O blood clots    due to side effect of medication  DVT   Headache(784.0)    migraines   Hypertension    Hypothyroidism    Leg cramps    Neuropathy    Pain    chronic    Pneumonia    PTSD (post-traumatic stress disorder)    Respiratory disorder    Schizophrenia (HCC)     Past Surgical History:  Procedure Laterality Date   ABDOMINAL HYSTERECTOMY     partial   ANTERIOR CERVICAL DECOMP/DISCECTOMY FUSION N/A 01/30/2020   Procedure: Cervical Three-FourAnterior cervical decompression/discectomy/fusion with Hardware Removal.;  Surgeon: Barnett Abu, MD;  Location: [facility] OR;  Service: Neurosurgery;  Laterality: N/A;  anterior   APPENDECTOMY     CARDIAC CATHETERIZATION     Ejection Fraction  65-70%   CERVICAL DISC ARTHROPLASTY N/A 02/21/2016   Procedure: Cervical three-four artificial disc replacement;  Surgeon: Barnett Abu, MD;  Location: [facility] NEURO ORS;  Service: Neurosurgery;  Laterality: N/A;   CERVICAL FUSION     C5-6   EXTERNAL EAR SURGERY     tumor at age 44, 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   HERNIA REPAIR     " as a baby"   I & D EXTREMITY Left 02/27/2022   Procedure: IRRIGATION AND DEBRIDEMENT FOREARM;  Surgeon: Betha Loa, MD;  Location: [facility] OR;  Service: Orthopedics;  Laterality: Left;   NECK SURGERY     OTHER SURGICAL HISTORY     History of cervical and  lumbar disk surgery   PERCUTANEOUS PINNING Left 12/01/2019   Procedure: Closed reduction percutaneous pinning left ring finger proximal phalanx fracture;  Surgeon: Allena Napoleon, MD;  Location: [facility] OR;  Service: Plastics;  Laterality: Left;   polypectomies     tubes   POSTERIOR CERVICAL LAMINECTOMY WITH MET- RX Left 06/13/2018   Procedure: Left Cervical three-four Laminotomy/foraminotomy;  Surgeon: Barnett Abu, MD;  Location: [facility];  Service: Neurosurgery;  Laterality: Left;   TONSILLECTOMY     TONSILLECTOMY AND ADENOIDECTOMY      Current Medications: Current Meds  Medication Sig   acetaminophen (TYLENOL) 500 MG tablet Take 1,000 mg by mouth every 6 (six) hours as needed for moderate pain (pain score 4-6), mild pain (pain score 1-3) or headache.   ALPRAZolam (XANAX) 1 MG tablet TAKE 1 TABLET (1 MG TOTAL) BY MOUTH IN THE MORNING AND AT BEDTIME. *10/29   baclofen (LIORESAL) 10 MG tablet Take 1 tablet (10 mg total) by mouth 3 (three) times daily.   bisacodyl (DULCOLAX) 5 MG EC tablet Take 5 mg by mouth 2 (two) times daily as needed for moderate constipation.    busPIRone (BUSPAR) 15 MG tablet Take 1 tablet (15 mg total) by mouth 3 (three) times daily.   citalopram (CELEXA) 20 MG tablet TAKE 1 TABLET BY MOUTH EVERY DAY   clotrimazole (LOTRIMIN) 1 % external solution Apply 1 Application topically daily.   hydrochlorothiazide (HYDRODIURIL) 25 MG tablet TAKE 1 TABLET (25 MG TOTAL) BY MOUTH DAILY.   hydrocortisone (ANUSOL-HC) 25 MG suppository Place 1 suppository (25 mg total) rectally 2 (two) times daily.   hydrOXYzine (ATARAX/VISTARIL) 25 MG tablet TAKE 3 TO 4 TABLETS BY MOUTH EVERY 8 HOURS AS NEEDED FOR ANXIETY. MAX AMOUNT PER INSURANCE   ketoconazole (NIZORAL) 2 % cream Apply 1 Application topically daily.   levothyroxine (SYNTHROID) 50 MCG tablet TAKE 1 TABLET BY MOUTH EVERY DAY   lidocaine (LIDODERM) 5 % Place 1 patch onto the skin daily as needed (pain).   lidocaine (XYLOCAINE) 5 %  ointment Apply 1 Application topically 3 (three) times daily as needed for moderate pain (pain score 4-6).   loratadine (CLARITIN) 10 MG tablet Take 10 mg by mouth 3 (three) times daily as needed for allergies (for seasonal allergies).   risperiDONE (RISPERDAL) 1 MG tablet TAKE 1 TABLET BY MOUTH EVERYDAY AT BEDTIME   tiZANidine (ZANAFLEX) 4 MG tablet Take 2 tablets (8 mg total) by mouth 3 (three) times daily.   triamcinolone cream (KENALOG) 0.1 % Apply 1 Application topically 2 (two) times daily.   XARELTO 20 MG TABS tablet TAKE 1 TABLET BY MOUTH DAILY WITH SUPPER   [DISCONTINUED] metoprolol tartrate (LOPRESSOR) 25 MG tablet TAKE 1 TABLET BY MOUTH TWICE A DAY   Current Facility-Administered Medications for the 10/12/23 encounter (Office  Visit) with Raffaela Ladley, MD  Medication   lidocaine (XYLOCAINE) 1 % (with pres) injection 6 mL   lidocaine (XYLOCAINE) 1 % (with pres) injection 6 mL     Allergies:   Morphine and codeine, Lyrica [pregabalin], and Neurontin [gabapentin]   Social History   Socioeconomic History   Marital status: Married    Spouse name: Reuel Boom   Number of children: 2   Years of education: 12   Highest education level: Not on file  Occupational History    Comment: homemaker  Tobacco Use   Smoking status: Every Day    Current packs/day: 0.50    Average packs/day: 0.5 packs/day for 25.0 years (12.5 ttl pk-yrs)    Types: Cigarettes   Smokeless tobacco: Never  Vaping Use   Vaping status: Never Used  Substance and Sexual Activity   Alcohol use: Not Currently    Comment: very rare   Drug use: No   Sexual activity: Not Currently  Other Topics Concern   Not on file  Social History Narrative   Patient is married [name]) and lives at home with her husband.   Patient has two adult children.   Patient is currently not working.   Patient has a high school education.   Patient is right-handed.   Patient drinks 1-2 cups of coffee per day.   Social Drivers of Manufacturing engineer Strain: Not on file  Food Insecurity: Low Risk  (05/25/2023)   Received from [facility]   Hunger Vital Sign    Worried About Running Out of Food in the Last Year: Never true    Ran Out of Food in the Last Year: Never true  Transportation Needs: No Transportation Needs (05/25/2023)   Received from [facility]    In the past 12 months, has lack of reliable transportation kept you from medical appointments, meetings, work or from getting things needed for daily living? : No  Physical Activity: Not on file  Stress: Not on file  Social Connections: Not on file     Family History: The patient's family history includes Cancer in her sister; Coronary artery disease in her father; Diabetes in her brother and sister; Parkinson's disease in her mother.  ROS:   Please see the history of present illness.     All other systems reviewed and are negative.  EKGs/Labs/Other Studies Reviewed:    The following studies were reviewed today:  EKG Interpretation Date/Time:  Tuesday October 12 2023 15:51:47 EST Ventricular Rate:  67 PR Interval:  136 QRS Duration:  138 QT Interval:  482 QTC Calculation: 509 R Axis:   38  Text Interpretation: Normal sinus rhythm Left bundle branch block When compared with ECG of 05-Oct-2023 14:48, Vent. rate has decreased BY  38 BPM T wave inversion now evident in Inferior leads Confirmed by Madalina Rosman ([id number]) on 10/12/2023 4:08:19 PM    Recent Labs: 09/14/2023: Hemoglobin 14.2; Platelets 382 10/05/2023: BUN 5; Creatinine, Ser 0.61; Potassium 3.8; Sodium 128  Recent Lipid Panel    Component Value Date/Time   CHOL 189 11/14/2019 1108   TRIG 164.0 (H) 11/14/2019 1108   HDL 62.80 11/14/2019 1108   CHOLHDL 3 11/14/2019 1108   VLDL 32.8 11/14/2019 1108   LDLCALC 93 11/14/2019 1108     Risk Assessment/Calculations:                Physical Exam:    VS:  BP 139/80   Pulse 67  Ht 5\' 5"  (1.651 m)   Wt 144 lb  (65.3 kg)   SpO2 95%   BMI 23.96 kg/m     Wt Readings from Last 3 Encounters:  10/12/23 144 lb (65.3 kg)  10/05/23 145 lb (65.8 kg)  10/01/23 150 lb 9.6 oz (68.3 kg)     GEN:  Well nourished, well developed in no acute distress HEENT: Normal NECK: No JVD; No carotid bruits LYMPHATICS: No lymphadenopathy CARDIAC: RRR, no murmurs, rubs, gallops RESPIRATORY:  Clear to auscultation without rales, wheezing or rhonchi  ABDOMEN: Soft, non-tender, non-distended MUSCULOSKELETAL:  No edema; No deformity  SKIN: Warm and dry NEUROLOGIC:  Alert and oriented x 3 PSYCHIATRIC:  Normal affect   ASSESSMENT:    1. Left bundle branch block   2. Primary hypertension   3. Preoperative cardiovascular examination    PLAN:    In order of problems listed above:  LBBB: Suspect that this is a rate related abnormality at this point.  We did not repeat her ECG today, but she has a normal second heart sound on physical exam.  Regardless, the most likely etiology for this is age-related conduction system disease.  She had normal echocardiograms in 2014 and 2020.  Will repeat an echocardiogram to make sure there is no evidence of new underlying cardiomyopathy, but I think she can go ahead with her planned hemorrhoidectomy without waiting for this evaluation.  It is interesting that her brother had a pacemaker at a young age of only 61 years old suggesting there may be some family pattern of early conduction abnormalities.  We did discuss the fact that a left bundle branch block makes the ECG much less diagnostic and it generally loses all value except for rhythm diagnosis.  (Suggested purchasing some type of personal electronic rhythm monitoring device so that she can get a prompt rhythm tracing if she develops any symptoms that would be conceivably due to higher grade AV block.) HTN: Fair control.  Continue current medications.  Her beta-blocker should not be interrupted in the perioperative period. Preop CV  eval: She has good functional status and is scheduled for a low risk procedure.  Low risk of major cardiovascular complications with the planned surgery.           Medication Adjustments/Labs and Tests Ordered: Current medicines are reviewed at length with the patient today.  Concerns regarding medicines are outlined above.  Orders Placed This Encounter  Procedures   EKG 12-Lead   ECHOCARDIOGRAM COMPLETE   No orders of the defined types were placed in this encounter.   Patient Instructions  Medication Instructions:  Your physician recommends that you continue on your current medications as directed. Please refer to the Current Medication list given to you today.    *If you need a refill on your cardiac medications before your next appointment, please call your pharmacy*   Lab Work: NONE    If you have labs (blood work) drawn today and your tests are completely normal, you will receive your results only by: MyChart Message (if you have MyChart) OR A paper copy in the mail If you have any lab test that is abnormal or we need to change your treatment, we will call you to review the results.   Testing/Procedures: Echo will be scheduled at [address].  Your physician has requested that you have an echocardiogram. Echocardiography is a painless test that uses sound waves to create images of your heart. It provides your doctor  with information about the size and shape of your heart and how well your heart's chambers and valves are working. This procedure takes approximately one hour. There are no restrictions for this procedure. Please do NOT wear cologne, perfume, aftershave, or lotions (deodorant is allowed). Please arrive 15 minutes prior to your appointment time.    Follow-Up: At [facility], you and your health needs are our priority.  As part of our continuing mission to provide you with exceptional heart care, we have created designated Provider Care  Teams.  These Care Teams include your primary Cardiologist (physician) and Advanced Practice Providers (APPs -  Physician Assistants and Nurse Practitioners) who all work together to provide you with the care you need, when you need it.  We recommend signing up for the patient portal called "MyChart".  Sign up information is provided on this After Visit Summary.  MyChart is used to connect with patients for Virtual Visits (Telemedicine).  Patients are able to view lab/test results, encounter notes, upcoming appointments, etc.  Non-urgent messages can be sent to your provider as well.   To learn more about what you can do with MyChart, go to [email or web address].    Your next appointment:   1 year(s)  The format for your next appointment:   In Person  Provider:   Dr. Royann Shivers    Other Instructions   Dr. Royann Shivers recommends you get a smart watch or kardiamobile device.   Kardia Mobile [employer]: Website: [email or web address]  DR. Royann Shivers RECOMMENDS YOU PURCHASE  " Kardia" [employer] FROM THE  [employer]  APP PLAY STORE.  THE APP IS FREE , BUT THE  EQUIPMENT HAS A COST. IT ALLOWS YOU TO OBTAIN A RECORDING OF YOUR HEART RATE AND RHYTHM BY PROVIDING A SHORT STRIP THAT YOU CAN SHARE WITH YOUR PROVIDER.     [facility] - sending an EKG Download app and set up profile. Run EKG - by placing 1-2 fingers on the silver plates After EKG is complete - Download PDF  - Skip password (if you apply a password the provider will need it to view the EKG) Click share button (square with upward arrow) in bottom left corner To send: choose MyChart (first time log into MyChart)  Pop up window about sending ECG Click continue Choose type of message Choose provider Type subject and message Click send (EKG should be attached)  - To send additional EKGs in one message click the paperclip image and bottom of page to attach.         Signed, Thurmon Fair, MD  10/13/2023 3:51  PM    [facility]

## 2023-10-13 NOTE — Progress Notes (Addendum)
Anesthesia Chart Review   Case: [id number] Date/Time: 10/14/23 0715   Procedures:      HEMORRHOIDECTOMY WITH LIGATION & HEMORRHOIDOPEXY - GENERAL & LOCAL     ANORECTAL EXAM UNDER ANESTHESIA - GENERAL & LOCAL   Anesthesia type: General   Pre-op diagnosis: Hemorrhoids External and Grade 4- prolasped with bleeding and pain   Location: [facility] ROOM 04 / [facility]   Surgeons: Karie Soda, MD       DISCUSSION:63 y.o. smoker with h/o bipolar, schizophrenia, HTN, asthma, DVT 2019 on Xarelto, diastolic dysfunction, hemorroids scheduled for above procedure 10/14/2023 with Dr. Karie Soda.   Sodium 128 at PAT visit, this appears chronic. Recheck DOS at anesthesia discretion.   02/28/2022 Sodium 133 09/14/2023 Sodium 129 10/05/2023 Sodium 128  Pt with new LBBB on EKG at PAT visit, cardiology input requested.  Pt seen by cardiology 10/12/2023, echo was ordered to be done at a later date 11/10/2023.  Pt reports good functional capacity, can climb a flight of stairs without sx. Clearance received from Dr. Royann Shivers which states, "[name] is at low-to-moderate risk, from a cardiac standpoint, for the upcoming procedure.  Xarelto can be held 2-3 days prior to the procedure.  It should be restarted as soon as safe postop.  Please do not interrupt the metoprolol in the perioperative period   Please call [phone] with any additional questions."  Pt reports last dose of Xarelto 10/11/2023 at 10am.   VS: BP (!) 179/83   Pulse 97   Temp 36.5 C (Oral)   Resp 17   Ht 5\' 5"  (1.651 m)   Wt 65.8 kg   SpO2 98%   BMI 24.13 kg/m   PROVIDERS: Sharlene Dory, DO is PCP   Croitoru, Rachelle Hora, MD is Cardiologist  LABS: Labs reviewed: Acceptable for surgery. (all labs ordered are listed, but only abnormal results are displayed)  Labs Reviewed  BASIC METABOLIC PANEL - Abnormal; Notable for the following components:      Result Value   Sodium 128 (*)    Chloride 87 (*)    Glucose, Bld 113 (*)     BUN 5 (*)    All other components within normal limits     IMAGES:   EKG:   CV: Echo 05/17/2019 1. Left ventricular ejection fraction, by visual estimation, is 60 to  65%. The left ventricle has normal function. Normal left ventricular size.  Left ventricular septal wall thickness was normal. Normal left ventricular  posterior wall thickness. There  is no left ventricular hypertrophy.   2. Global right ventricle has normal systolic function.The right  ventricular size is normal. No increase in right ventricular wall  thickness.   3. Left atrial size was normal.   4. Right atrial size was normal.   5. Presence of pericardial fat pad.   6. The mitral valve is normal in structure. No evidence of mitral valve  regurgitation. No evidence of mitral stenosis.   7. The tricuspid valve is normal in structure. Tricuspid valve  regurgitation was not visualized by color flow Doppler.   8. The aortic valve is normal in structure. Aortic valve regurgitation  was not visualized by color flow Doppler. Structurally normal aortic  valve, with no evidence of sclerosis or stenosis.   9. The pulmonic valve was normal in structure. Pulmonic valve  regurgitation is not visualized by color flow Doppler.  10. TR signal is inadequate for assessing pulmonary artery systolic  pressure.   Past Medical History:  Diagnosis  Date   Asthma    seasonal   Back injury    Chronic   Bipolar disorder (HCC)    Cardiomyopathy (HCC)    hypertension controlled-helped with edema-Dr Nahser   Deafness    Left ear   Degenerative joint disease    with neck surgery   Depression    Diabetes  during pregnancy    Diastolic dysfunction    with possible mild LVOT gradient   Family history of adverse reaction to anesthesia    " my daughter has PONV and gets very grumpy"   Fibromyalgia    neuropathy   Former cigarette smoker    H/O blood clots    due to side effect of medication  DVT   Headache(784.0)     migraines   Hypertension    Hypothyroidism    Leg cramps    Neuropathy    Pain    chronic    Pneumonia    PTSD (post-traumatic stress disorder)    Respiratory disorder    Schizophrenia (HCC)     Past Surgical History:  Procedure Laterality Date   ABDOMINAL HYSTERECTOMY     partial   ANTERIOR CERVICAL DECOMP/DISCECTOMY FUSION N/A 01/30/2020   Procedure: Cervical Three-FourAnterior cervical decompression/discectomy/fusion with Hardware Removal.;  Surgeon: Barnett Abu, MD;  Location: [facility];  Service: Neurosurgery;  Laterality: N/A;  anterior   APPENDECTOMY     CARDIAC CATHETERIZATION     Ejection Fraction 65-70%   CERVICAL DISC ARTHROPLASTY N/A 02/21/2016   Procedure: Cervical three-four artificial disc replacement;  Surgeon: Barnett Abu, MD;  Location: [facility];  Service: Neurosurgery;  Laterality: N/A;   CERVICAL FUSION     C5-6   EXTERNAL EAR SURGERY     tumor at age 57, 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   HERNIA REPAIR     " as a baby"   I & D EXTREMITY Left 02/27/2022   Procedure: IRRIGATION AND DEBRIDEMENT FOREARM;  Surgeon: Betha Loa, MD;  Location: [facility];  Service: Orthopedics;  Laterality: Left;   NECK SURGERY     OTHER SURGICAL HISTORY     History of cervical and lumbar disk surgery   PERCUTANEOUS PINNING Left 12/01/2019   Procedure: Closed reduction percutaneous pinning left ring finger proximal phalanx fracture;  Surgeon: Allena Napoleon, MD;  Location: [facility];  Service: Plastics;  Laterality: Left;   polypectomies     tubes   POSTERIOR CERVICAL LAMINECTOMY WITH MET- RX Left 06/13/2018   Procedure: Left Cervical three-four Laminotomy/foraminotomy;  Surgeon: Barnett Abu, MD;  Location: [facility];  Service: Neurosurgery;  Laterality: Left;   TONSILLECTOMY     TONSILLECTOMY AND ADENOIDECTOMY      MEDICATIONS:  acetaminophen (TYLENOL) 500 MG tablet   ALPRAZolam (XANAX) 1 MG tablet   baclofen (LIORESAL) 10 MG tablet   bisacodyl (DULCOLAX) 5 MG EC tablet   busPIRone (BUSPAR)  15 MG tablet   ciprofloxacin-dexamethasone (CIPRODEX) OTIC suspension   citalopram (CELEXA) 20 MG tablet   clotrimazole (LOTRIMIN) 1 % external solution   hydrochlorothiazide (HYDRODIURIL) 25 MG tablet   hydrocortisone (ANUSOL-HC) 25 MG suppository   hydrOXYzine (ATARAX/VISTARIL) 25 MG tablet   ketoconazole (NIZORAL) 2 % cream   levothyroxine (SYNTHROID) 50 MCG tablet   lidocaine (LIDODERM) 5 %   lidocaine (XYLOCAINE) 5 % ointment   loratadine (CLARITIN) 10 MG tablet   metoprolol tartrate (LOPRESSOR) 25 MG tablet   Nitroglycerin 0.4 % OINT   risperiDONE (RISPERDAL) 1 MG tablet   tiZANidine (ZANAFLEX) 4  MG tablet   triamcinolone cream (KENALOG) 0.1 %   XARELTO 20 MG TABS tablet    lidocaine (XYLOCAINE) 1 % (with pres) injection 6 mL   lidocaine (XYLOCAINE) 1 % (with pres) injection 6 mL    [facility], PA-C [facility]-Surgical Testing [phone]

## 2023-10-14 ENCOUNTER — Other Ambulatory Visit: Payer: Self-pay

## 2023-10-14 ENCOUNTER — Ambulatory Visit (HOSPITAL_COMMUNITY): Payer: BLUE CROSS/BLUE SHIELD | Admitting: Anesthesiology

## 2023-10-14 ENCOUNTER — Encounter (HOSPITAL_COMMUNITY): Payer: Self-pay | Admitting: Surgery

## 2023-10-14 ENCOUNTER — Ambulatory Visit (HOSPITAL_COMMUNITY)
Admission: RE | Admit: 2023-10-14 | Discharge: 2023-10-14 | Disposition: A | Discharge status: Home / Self Care | Payer: BLUE CROSS/BLUE SHIELD | Attending: Surgery | Admitting: Surgery

## 2023-10-14 ENCOUNTER — Encounter (HOSPITAL_COMMUNITY)
Admission: RE | Disposition: A | Discharge status: Home / Self Care | Payer: Self-pay | Source: Home / Self Care | Attending: Surgery

## 2023-10-14 ENCOUNTER — Ambulatory Visit (HOSPITAL_COMMUNITY): Payer: BLUE CROSS/BLUE SHIELD | Admitting: Physician Assistant

## 2023-10-14 ENCOUNTER — Other Ambulatory Visit (HOSPITAL_COMMUNITY): Payer: Self-pay

## 2023-10-14 DIAGNOSIS — I4891 Unspecified atrial fibrillation: Secondary | ICD-10-CM | POA: Diagnosis not present

## 2023-10-14 DIAGNOSIS — F17210 Nicotine dependence, cigarettes, uncomplicated: Secondary | ICD-10-CM | POA: Insufficient documentation

## 2023-10-14 DIAGNOSIS — M797 Fibromyalgia: Secondary | ICD-10-CM | POA: Insufficient documentation

## 2023-10-14 DIAGNOSIS — K644 Residual hemorrhoidal skin tags: Secondary | ICD-10-CM | POA: Insufficient documentation

## 2023-10-14 DIAGNOSIS — Z7901 Long term (current) use of anticoagulants: Secondary | ICD-10-CM | POA: Insufficient documentation

## 2023-10-14 DIAGNOSIS — D128 Benign neoplasm of rectum: Secondary | ICD-10-CM | POA: Insufficient documentation

## 2023-10-14 DIAGNOSIS — K621 Rectal polyp: Secondary | ICD-10-CM | POA: Diagnosis not present

## 2023-10-14 DIAGNOSIS — E119 Type 2 diabetes mellitus without complications: Secondary | ICD-10-CM | POA: Diagnosis not present

## 2023-10-14 DIAGNOSIS — F418 Other specified anxiety disorders: Secondary | ICD-10-CM | POA: Diagnosis not present

## 2023-10-14 DIAGNOSIS — F319 Bipolar disorder, unspecified: Secondary | ICD-10-CM | POA: Diagnosis not present

## 2023-10-14 DIAGNOSIS — I10 Essential (primary) hypertension: Secondary | ICD-10-CM | POA: Insufficient documentation

## 2023-10-14 DIAGNOSIS — K643 Fourth degree hemorrhoids: Secondary | ICD-10-CM | POA: Insufficient documentation

## 2023-10-14 DIAGNOSIS — Z01818 Encounter for other preprocedural examination: Secondary | ICD-10-CM

## 2023-10-14 DIAGNOSIS — M1990 Unspecified osteoarthritis, unspecified site: Secondary | ICD-10-CM | POA: Insufficient documentation

## 2023-10-14 DIAGNOSIS — J45909 Unspecified asthma, uncomplicated: Secondary | ICD-10-CM | POA: Diagnosis not present

## 2023-10-14 DIAGNOSIS — Z79899 Other long term (current) drug therapy: Secondary | ICD-10-CM | POA: Insufficient documentation

## 2023-10-14 DIAGNOSIS — G709 Myoneural disorder, unspecified: Secondary | ICD-10-CM | POA: Diagnosis not present

## 2023-10-14 DIAGNOSIS — E039 Hypothyroidism, unspecified: Secondary | ICD-10-CM | POA: Diagnosis not present

## 2023-10-14 HISTORY — PX: HEMORRHOID SURGERY: SHX153

## 2023-10-14 HISTORY — PX: RECTAL EXAM UNDER ANESTHESIA: SHX6399

## 2023-10-14 SURGERY — HEMORRHOIDECTOMY
Anesthesia: General

## 2023-10-14 MED ORDER — ONDANSETRON HCL 4 MG/2ML IJ SOLN
INTRAMUSCULAR | Status: DC | PRN
  Administered 2023-10-14: 4 mg via INTRAVENOUS

## 2023-10-14 MED ORDER — CELECOXIB 200 MG PO CAPS
200.0000 mg | ORAL_CAPSULE | ORAL | Status: AC
  Administered 2023-10-14: 200 mg via ORAL
  Filled 2023-10-14: qty 1

## 2023-10-14 MED ORDER — DEXAMETHASONE SODIUM PHOSPHATE 10 MG/ML IJ SOLN
INTRAMUSCULAR | Status: DC | PRN
  Administered 2023-10-14: 10 mg via INTRAVENOUS

## 2023-10-14 MED ORDER — BUPIVACAINE LIPOSOME 1.3 % IJ SUSP
INTRAMUSCULAR | Status: DC | PRN
Start: 2023-10-14 — End: 2023-10-14
  Administered 2023-10-14: 20 mL

## 2023-10-14 MED ORDER — OXYCODONE HCL 5 MG PO TABS: 5.0000 mg | ORAL_TABLET | Freq: Four times a day (QID) | ORAL | 0 refills | Status: DC | PRN

## 2023-10-14 MED ORDER — CHLORHEXIDINE GLUCONATE CLOTH 2 % EX PADS
6.0000 | MEDICATED_PAD | Freq: Once | CUTANEOUS | Status: DC
Start: 2023-10-14 — End: 2023-10-14

## 2023-10-14 MED ORDER — DIBUCAINE (PERIANAL) 1 % EX OINT
TOPICAL_OINTMENT | CUTANEOUS | Status: DC | PRN
  Administered 2023-10-14: 1 via RECTAL

## 2023-10-14 MED ORDER — OXYCODONE HCL 5 MG PO TABS
ORAL_TABLET | ORAL | Status: AC
  Filled 2023-10-14: qty 1

## 2023-10-14 MED ORDER — LACTATED RINGERS IV SOLN
INTRAVENOUS | Status: DC
Start: 2023-10-14 — End: 2023-10-14

## 2023-10-14 MED ORDER — OXYCODONE HCL 5 MG PO TABS
5.0000 mg | ORAL_TABLET | Freq: Once | ORAL | Status: AC | PRN
  Administered 2023-10-14: 5 mg via ORAL

## 2023-10-14 MED ORDER — PROPOFOL 10 MG/ML IV BOLUS
INTRAVENOUS | Status: DC | PRN
  Administered 2023-10-14: 120 mg via INTRAVENOUS

## 2023-10-14 MED ORDER — HYDROMORPHONE HCL 1 MG/ML IJ SOLN
INTRAMUSCULAR | Status: AC
  Administered 2023-10-14: 0.25 mg via INTRAVENOUS
  Filled 2023-10-14: qty 1

## 2023-10-14 MED ORDER — LIDOCAINE HCL (PF) 2 % IJ SOLN
INTRAMUSCULAR | Status: AC
  Filled 2023-10-14: qty 5

## 2023-10-14 MED ORDER — ROCURONIUM BROMIDE 10 MG/ML (PF) SYRINGE
PREFILLED_SYRINGE | INTRAVENOUS | Status: AC
  Filled 2023-10-14: qty 10

## 2023-10-14 MED ORDER — DEXAMETHASONE SODIUM PHOSPHATE 10 MG/ML IJ SOLN
INTRAMUSCULAR | Status: AC
  Filled 2023-10-14: qty 1

## 2023-10-14 MED ORDER — MEPERIDINE HCL 50 MG/ML IJ SOLN: 6.2500 mg | INTRAMUSCULAR | Status: DC | PRN

## 2023-10-14 MED ORDER — BUPIVACAINE LIPOSOME 1.3 % IJ SUSP
INTRAMUSCULAR | Status: AC
  Filled 2023-10-14: qty 20

## 2023-10-14 MED ORDER — MIDAZOLAM HCL 2 MG/2ML IJ SOLN
INTRAMUSCULAR | Status: AC
  Filled 2023-10-14: qty 2

## 2023-10-14 MED ORDER — DIBUCAINE (PERIANAL) 1 % EX OINT
TOPICAL_OINTMENT | CUTANEOUS | Status: AC
  Filled 2023-10-14: qty 28

## 2023-10-14 MED ORDER — BUPIVACAINE-EPINEPHRINE 0.25% -1:200000 IJ SOLN
INTRAMUSCULAR | Status: AC
  Filled 2023-10-14: qty 1

## 2023-10-14 MED ORDER — AMISULPRIDE (ANTIEMETIC) 5 MG/2ML IV SOLN: 10.0000 mg | Freq: Once | INTRAVENOUS | Status: DC | PRN

## 2023-10-14 MED ORDER — FENTANYL CITRATE (PF) 100 MCG/2ML IJ SOLN
INTRAMUSCULAR | Status: AC
  Filled 2023-10-14: qty 2

## 2023-10-14 MED ORDER — ORAL CARE MOUTH RINSE: 15.0000 mL | Freq: Once | OROMUCOSAL | Status: AC

## 2023-10-14 MED ORDER — CHLORHEXIDINE GLUCONATE CLOTH 2 % EX PADS: 6.0000 | MEDICATED_PAD | Freq: Once | CUTANEOUS | Status: DC

## 2023-10-14 MED ORDER — SUGAMMADEX SODIUM 200 MG/2ML IV SOLN
INTRAVENOUS | Status: DC | PRN
  Administered 2023-10-14: 200 mg via INTRAVENOUS

## 2023-10-14 MED ORDER — OXYCODONE HCL 5 MG/5ML PO SOLN: 5.0000 mg | Freq: Once | ORAL | Status: AC | PRN

## 2023-10-14 MED ORDER — LIDOCAINE HCL (CARDIAC) PF 100 MG/5ML IV SOSY
PREFILLED_SYRINGE | INTRAVENOUS | Status: DC | PRN
  Administered 2023-10-14: 50 mg via INTRAVENOUS

## 2023-10-14 MED ORDER — HYDROMORPHONE HCL 1 MG/ML IJ SOLN
0.2500 mg | INTRAMUSCULAR | Status: DC | PRN
  Administered 2023-10-14: 0.25 mg via INTRAVENOUS
  Administered 2023-10-14: 0.5 mg via INTRAVENOUS

## 2023-10-14 MED ORDER — ACETAMINOPHEN 500 MG PO TABS
1000.0000 mg | ORAL_TABLET | ORAL | Status: AC
  Administered 2023-10-14: 1000 mg via ORAL
  Filled 2023-10-14: qty 2

## 2023-10-14 MED ORDER — 0.9 % SODIUM CHLORIDE (POUR BTL) OPTIME
TOPICAL | Status: DC | PRN
  Administered 2023-10-14: 1000 mL

## 2023-10-14 MED ORDER — EPHEDRINE SULFATE-NACL 50-0.9 MG/10ML-% IV SOSY
PREFILLED_SYRINGE | INTRAVENOUS | Status: DC | PRN
  Administered 2023-10-14: 10 mg via INTRAVENOUS

## 2023-10-14 MED ORDER — CHLORHEXIDINE GLUCONATE 0.12 % MT SOLN
15.0000 mL | Freq: Once | OROMUCOSAL | Status: AC
  Administered 2023-10-14: 15 mL via OROMUCOSAL

## 2023-10-14 MED ORDER — FENTANYL CITRATE (PF) 100 MCG/2ML IJ SOLN
INTRAMUSCULAR | Status: DC | PRN
  Administered 2023-10-14: 100 ug via INTRAVENOUS

## 2023-10-14 MED ORDER — ROCURONIUM BROMIDE 10 MG/ML (PF) SYRINGE
PREFILLED_SYRINGE | INTRAVENOUS | Status: DC | PRN
  Administered 2023-10-14: 10 mg via INTRAVENOUS
  Administered 2023-10-14: 60 mg via INTRAVENOUS

## 2023-10-14 MED ORDER — PROPOFOL 10 MG/ML IV BOLUS
INTRAVENOUS | Status: AC
  Filled 2023-10-14: qty 20

## 2023-10-14 MED ORDER — SODIUM CHLORIDE 0.9 % IV SOLN: 12.5000 mg | INTRAVENOUS | Status: DC | PRN

## 2023-10-14 MED ORDER — BUPIVACAINE-EPINEPHRINE 0.5% -1:200000 IJ SOLN
INTRAMUSCULAR | Status: DC | PRN
  Administered 2023-10-14: 30 mL

## 2023-10-14 MED ORDER — ONDANSETRON HCL 4 MG/2ML IJ SOLN
INTRAMUSCULAR | Status: AC
  Filled 2023-10-14: qty 2

## 2023-10-14 MED ORDER — BUPIVACAINE LIPOSOME 1.3 % IJ SUSP
20.0000 mL | Freq: Once | INTRAMUSCULAR | Status: DC
Start: 2023-10-14 — End: 2023-10-14

## 2023-10-14 MED ORDER — MIDAZOLAM HCL 2 MG/2ML IJ SOLN
INTRAMUSCULAR | Status: DC | PRN
Start: 2023-10-14 — End: 2023-10-14
  Administered 2023-10-14: 2 mg via INTRAVENOUS

## 2023-10-14 SURGICAL SUPPLY — 30 items
BAG COUNTER SPONGE SURGICOUNT (BAG) IMPLANT
BENZOIN TINCTURE PRP APPL 2/3 (GAUZE/BANDAGES/DRESSINGS) ×2 IMPLANT
BLADE SURG 15 STRL LF DISP TIS (BLADE) IMPLANT
BRIEF MESH DISP LRG (UNDERPADS AND DIAPERS) ×2 IMPLANT
CNTNR URN SCR LID CUP LEK RST (MISCELLANEOUS) ×2 IMPLANT
COVER SURGICAL LIGHT HANDLE (MISCELLANEOUS) ×2 IMPLANT
DRAPE LAPAROTOMY T 102X78X121 (DRAPES) ×2 IMPLANT
ELECT REM PT RETURN 15FT ADLT (MISCELLANEOUS) ×2 IMPLANT
GAUZE 4X4 16PLY ~~LOC~~+RFID DBL (SPONGE) ×2 IMPLANT
GAUZE PAD ABD 8X10 STRL (GAUZE/BANDAGES/DRESSINGS) IMPLANT
GAUZE SPONGE 4X4 12PLY STRL (GAUZE/BANDAGES/DRESSINGS) IMPLANT
GLOVE ECLIPSE 8.0 STRL XLNG CF (GLOVE) ×2 IMPLANT
GLOVE INDICATOR 8.0 STRL GRN (GLOVE) ×2 IMPLANT
GOWN STRL REUS W/ TWL XL LVL3 (GOWN DISPOSABLE) ×6 IMPLANT
KIT BASIN OR (CUSTOM PROCEDURE TRAY) ×2 IMPLANT
KIT TURNOVER KIT A (KITS) IMPLANT
LOOP VESSEL MAXI BLUE (MISCELLANEOUS) IMPLANT
NDL HYPO 22X1.5 SAFETY MO (MISCELLANEOUS) ×2 IMPLANT
NEEDLE HYPO 22X1.5 SAFETY MO (MISCELLANEOUS) ×1
PACK BASIC VI WITH GOWN DISP (CUSTOM PROCEDURE TRAY) ×2 IMPLANT
PENCIL BUTTON HOLSTER BLD 10FT (ELECTRODE) ×2 IMPLANT
SCRUB CHG 4% DYNA-HEX 4OZ (MISCELLANEOUS) ×2 IMPLANT
SPIKE FLUID TRANSFER (MISCELLANEOUS) ×2 IMPLANT
SURGILUBE 2OZ TUBE FLIPTOP (MISCELLANEOUS) ×2 IMPLANT
SUT CHROMIC 2 0 SH (SUTURE) ×2 IMPLANT
SUT VIC AB 2-0 UR6 27 (SUTURE) ×12 IMPLANT
SYR 20ML LL LF (SYRINGE) ×2 IMPLANT
SYR 3ML LL SCALE MARK (SYRINGE) IMPLANT
TOWEL OR 17X26 10 PK STRL BLUE (TOWEL DISPOSABLE) ×2 IMPLANT
YANKAUER SUCT BULB TIP 10FT TU (MISCELLANEOUS) ×2 IMPLANT

## 2023-10-14 NOTE — Progress Notes (Signed)
Pt reports taking baclofen, tizanidine, xanax, hydrochlorothiazide and metoprolol prior to arrival.

## 2023-10-14 NOTE — Discharge Instructions (Signed)
 ##############################################  ANORECTAL SURGERY:  POST OPERATIVE INSTRUCTIONS  ######################################################################  EAT Start with a pureed / full liquid diet After 24 hours, gradually transition to a high fiber diet.    CONTROL PAIN Control pain so you can tolerate bowel movements,  walk, sleep, tolerate sneezing/coughing, and go up/down stairs.   HAVE A BOWEL MOVEMENT DAILY Keep your bowels regular to avoid problems.   Taking a fiber supplement 2-4 doses every day to keep bowels soft.   HAVE A BM BY THE SECOND POSTOPERATIVE DAY Use an antidairrheal to slow down diarrhea.   Call if not better after 2 tries  WALK Walk an hour a day.  Control your pain to do that.   CALL IF YOU HAVE PROBLEMS/CONCERNS Call if you are still struggling despite following these instructions. Call if you have concerns not answered by these instructions  ######################################################################    Take your usually prescribed home medications unless otherwise directed. Blood thinners:  You can restart any strong blood thinners after the second postoperative day  for example: COUMADIN (warfarin), XERELTO (rivaroxaban), ELIQUIS (apixaban), PLAVIX (clopidigrel), BRILINTA (ticagrelor), EFFIENT (prasugrel), PRADAXA (dabigatran), etc  Continue aspirin before & after surgery..     Some oozing/bleeding the first 1-2 weeks is common but should taper down & be small volume.    If you are passing many large clots or having uncontrolling bleeding, call your surgeon  DIET: Follow a light bland diet & liquids the first 24 hours after arrival home, such as soup, liquids, starches, etc.  Be sure to drink plenty of fluids.  Quickly advance to a usual solid diet within a few days.  Avoid fast food or heavy meals as your are more likely to get nauseated or have irregular bowels.  A low-fat, high-fiber diet for the rest of your life  is ideal.  Control pain to avoid problems with urinating or defecating:  Pain is best controlled by a usual combination of many methods TOGETHER: Warm baths/soaks or Ice packs Over the counter pain medication Prescription pain medications Topical creams  A.  Warm water baths or ice packs (30-60 minutes up to 8 times a day, especially after bowel meovements) will help.  Using ice for the first few days can help decrease swelling and bruising,  Use heat such as warm towels, sitz baths, warm baths, warm showers, etc to help relax tight/sore spots and speed recovery.   Some people prefer to use ice alone, heat alone, alternating between ice & heat.  Experiment to what works for you.    It is helpful to take an over-the-counter pain medication continuously for the first few weeks.  This helps people need to use less narcotics Choose one of the following that works best for you: Naproxen (Aleve, etc)  Two 220mg  tabs twice a day Ibuprofen (Advil, etc) Three 200mg  tabs four times a day (every meal & bedtime) Acetaminophen (Tylenol, etc) 500-650mg  four times a day (every meal & bedtime)  A  prescription for pain medication (such as oxycodone, hydrocodone, etc) should be given to you upon discharge.  Take your pain medication as prescribed.  If you are having problems/concerns with the prescription medicine (does not control pain, nausea, vomiting, rash, itching, etc), please call us [phone] to see if we need to switch you to a different pain medicine that will work better for you and/or control your side effect better. Most people will need 1-2 refills to get through the first couple of weeks when pain is the most  intense.  If you need a refill on your pain medication, please contact your pharmacy.  They will contact our office to request authorization. Prescriptions will not be filled after 5 pm or on week-ends.  If can take up to 48 hours for it to be filled & ready so avoid waiting until you  are down to thel ast pill.  A numbing topical cream (Dibucaine) may be given to you.  Many people find relief with topical creams.  Some people find it burns too much.  Experiment.  If it helps, use it.  If it burns, stop using it.  You also may receive a prescription for diazepam, a muscle relaxant to help you to be able to urinate at first easily.  It is safe to take a few doses with the other medications as long as you are not planning to drive or do anything intense.  Hopefully this can minimize the chance of needing a Foley catheter into your bladder     Have a bowel movement every day  Until you have a bowel movement after surgery, he will struggle with urinating and controlling her pain.  Take a fiber supplement (such as Metamucil, Citrucel, FiberCon, MiraLax, etc) 2-4 doses a day to help prevent constipation from occurring.     If you have not had a bowel movement the second postoperative day:  -switch to drinking liquids only -take MiraLAX double dose every 2 hours x 3 or until you have a BM -If that does not work x 3 doses, increase to 4 doses every 2 hours (Like a small bowel prep) until you have a bowel movement. -Avoid enemas or suppositories (can harm sutures/repair) -Once you start having bowel movements, adjust your fiber supplement or Miralax dosing back down until you are having 1-2 soft well formed BMs a day.  (Start at 2 doses a day & adjust up or down to avoid diarrhea or recurrent constipation)   Watch out for diarrhea.  If you have many loose bowel movements, simplify your diet to bland foods & liquids for a few days.  Stop any stool softeners and decrease your fiber supplement.  Switching to mild anti-diarrheal medications (Kayopectate, Pepto Bismol) can help.  Can try an imodium/loperamide dose.  If this worsens or does not improve, please call us.  Wound Care   a. You have some fluffed cotton on top of the anus to help catch drainage and bleeding.  THERE IS NO  PACKING INSIDE THE RECTUM -Let the cotton fall off with the first bowel movement or shower.  It is okay to reinforce or replace as needed.  Bleeding is common at first and gradually slows down after a few weeks   b. Place soft cotton balls on the anus/wounds and use an absorbent pad in your underwear as needed to catch any drainage and help keep the area.  Try to use cotton balls or pads (regular gauze or toilet paper will stick and pull, causing pain.  Cotton will come off more easily).  OK to try dry powders such as cornstarch or baby powders   c. Keep the area clean and dry.  Bathe / shower every day.  Keep the area clean by showering / bathing over the incision / wound.   It is okay to soak an open wound to help wash it.  Consider using a squeeze bottle filled with warm water to gently wash the anal area.  Wet wipes or showers / gentle washing after bowel movements  is often less traumatic than regular toilet paper.           D.  Use a Sitz Bath 4-8 times a day for relief  A sitz bath is a warm water bath taken in the sitting position that covers only the hips and buttocks. It may be used for either healing or hygiene purposes. Sitz baths are also used to relieve pain, itching, or muscle spasms.  Gently cleaned the area and the heat will help lower spasm and offer better pain control.    Fill the bathtub half full with warm water. Sit in the water and open the drain a little. Turn on the warm water to keep the tub half full. Keep the water running constantly. Soak in the water for 15 to 20 minutes. After the sitz bath, pat the affected area dry first.   d. You will often notice bleeding, especially with bowel movements.  This should slow down by the end of the first week of surgery.  It can take over a month to more fully stop.  Sitting on an ice pack can help.   e. Expect some drainage.  You often will have some blood or yellow drainage with open wounds.  Sometimes she will get a little leaking  of liquid stool until the incision/wounds have fully close down.  This should slow down by the end of the first week of surgery, but you will have occasional bleeding or drainage up to a few months after surgery until all incisions & wounds have closed.  Wear an absorbent pad or soft cotton gauze in your underwear until the drainage stops.  ACTIVITIES as tolerated:    You may resume regular (light) daily activities beginning the next day--such as daily self-care, walking, climbing stairs--gradually increasing activities as tolerated.  If you can walk 30 minutes without difficulty, it is safe to try more intense activity such as jogging, treadmill, bicycling, low-impact aerobics, swimming, etc. Save the most intensive and strenuous activity for last such as sit-ups, heavy lifting, contact sports, etc  Refrain from any heavy lifting or straining until you are off narcotics for pain control.   DO NOT PUSH THROUGH PAIN.  Let pain be your guide: If it hurts to do something, don't do it.  Pain is your body warning you to avoid that activity for another week until the pain goes down. You may drive when you are no longer taking prescription pain medication, you can comfortably sit for long periods of time, and you can safely maneuver your car and apply brakes. You may have sexual intercourse when it is comfortable.   6.  FOLLOW UP in our office Please call [facility] at [phone] to set up an appointment to see your surgeon in the office for a follow-up appointment approximately 3 weeks after your surgery. If tissue is sent for pathology, it usually takes a week for pathology results to come back.  We will make an effort to call you with results as soon as we get them.  If you have not heard back in a week and wish to know the results before your postop visit, please call our office and we will see if we can give feedback on the results Make sure that you call for this appointment the day you arrive home to  ensure a convenient appointment time.  7. IF YOU HAVE DISABILITY OR FAMILY LEAVE FORMS, BRING THEM TO THE OFFICE FOR PROCESSING.  DO NOT GIVE THEM TO YOUR  DOCTOR.        WHEN TO CALL us [phone]: Poor pain control Reactions / problems with new medications (rash/itching, nausea, etc)  Fever over 101.5 F (38.5 C) Inability to urinate Nausea and/or vomiting Worsening swelling or bruising Continued bleeding from incision. Increased pain, redness, or drainage from the incision  The clinic staff is available to answer your questions during regular business hours (8:30am-5pm).  Please don't hesitate to call and ask to speak to one of our nurses for clinical concerns.   A surgeon from [facility] is always on call at the hospitals   If you have a medical emergency, go to the nearest emergency room or call 911.    [facility] [address], [place], [place]  [address] ? MAIN: [phone] ? TOLL FREE: [phone] ? FAX [phone] [email or web address]  #####################################################

## 2023-10-14 NOTE — Anesthesia Postprocedure Evaluation (Signed)
Anesthesia Post Note  Patient: [name]  Procedure(s) Performed: HEMORRHOIDECTOMY WITH LIGATION & HEMORRHOIDOPEXY ANORECTAL EXAM UNDER ANESTHESIA     Patient location during evaluation: PACU Anesthesia Type: General Level of consciousness: awake and alert Pain management: pain level controlled Vital Signs Assessment: post-procedure vital signs reviewed and stable Respiratory status: spontaneous breathing, nonlabored ventilation and respiratory function stable Cardiovascular status: blood pressure returned to baseline and stable Postop Assessment: no apparent nausea or vomiting Anesthetic complications: no   No notable events documented.  Last Vitals:  Vitals:   10/14/23 0930 10/14/23 0945  BP: (!) 144/81 138/72  Pulse: 64 65  Resp: 17 18  Temp:    SpO2: 95% 92%    Last Pain:  Vitals:   10/14/23 0945  TempSrc:   PainSc: Asleep                 Lowella Curb

## 2023-10-14 NOTE — H&P (Signed)
10/14/2023    REFERRING PHYSICIAN: Sharlene Dory*  Patient Care Team: Sharlene Dory, DO as PCP - General (Family Medicine)  PROVIDER: Jarrett Soho, MD  [facility] MRN: [id number] DOB: April 28, 1961  SUBJECTIVE   Chief Complaint: New Consultation (External hemorrhoid)   [name] is a 63 y.o. female  who is seen today as an office consultation  at the request of DrMarland Kitchen Carmelia Roller  for evaluation of anorectal pain and hemorrhoids.  History of Present Illness:  Patient with complaints of external hemorrhoids for some time. Had some irritation in in the fall. Saw primary care in October and it was recommended to do suppositories and nitroglycerin cream. Followed up 6 weeks later with only marginal improvement. Surgical consultation offered patient notes she has had some hemorrhoid flares in the past that usually been resolving with over-the-counter creams but this 1 has been simmering for over 3 months. She felt initially the suppositories and creams helped but then its come back. She feels something in the outside. Very sensitive. She claims she moves her bowels usually once a day. Still some irritation and urgency though. She had an appendectomy when she was an infant. She had a partial hysterectomy. No other abdominal surgeries.   History of bipolar disorder, anxiety disorder, PTSD. Followed by psychiatry on Xanax/BuSpar/Celexa/Risperdal. History of left lower extremity thigh DVT with some neuropathy. On Xarelto. Tobacco abuse. Hypertension. Had a normal echocardiogram in 2020. She claims she can walk half hour without difficulty.  Medical History:  Past Medical History:  Diagnosis Date  Anxiety  Arthritis  DVT (deep venous thrombosis) (CMS/HHS-HCC)  Pulmonary embolus (CMS/HHS-HCC)   There is no problem list on file for this patient.  Past Surgical History:  Procedure Laterality Date  ENDOSCOPIC CARPAL TUNNEL RELEASE  HYSTERECTOMY    Allergies  Allergen  Reactions  Gabapentin Hives and Rash   Current Outpatient Medications on File Prior to Visit  Medication Sig Dispense Refill  acetaminophen (TYLENOL) 500 MG tablet Take 1,000 mg by mouth  ALPRAZolam (XANAX) 1 MG tablet TAKE 1 TABLET (1 MG TOTAL) BY MOUTH IN THE MORNING AND AT BEDTIME. *10/29  baclofen (LIORESAL) 10 MG tablet Take 10 mg by mouth  bisacodyL (DULCOLAX) 5 mg EC tablet Take 5 mg by mouth  busPIRone (BUSPAR) 15 MG tablet Take 15 mg by mouth 3 (three) times daily  citalopram (CELEXA) 20 MG tablet  clotrimazole-betamethasone (LOTRISONE) 1-0.05 % cream Apply 1 Application topically  ketoconazole (NIZORAL) 2 % cream APPLY 1 APPLICATION TOPICALLY DAILY  lidocaine, PF, (XYLOCAINE) 1 % injection 6 mLs by Other route  loratadine (CLARITIN) 10 mg tablet Take 10 mg by mouth  metoprolol SUCCinate (TOPROL-XL) 25 MG XL tablet Take 25 mg by mouth 2 (two) times daily  naproxen (NAPROSYN) 500 MG tablet TAKE 1 TABLET DAILY AS NEEDED FOR PAIN. USE SPARINGLY.  nitroglycerin 0.4 % (w/w) Oint Apply 1 inch (375 mg) ointment intra-anally every 12 hours for anal fissure  risperiDONE (RISPERDAL) 1 MG tablet Take 1 mg by mouth at bedtime  tiZANidine (ZANAFLEX) 4 MG tablet Take 8 mg by mouth  triamcinolone 0.1 % cream Apply 1 Application topically 2 (two) times daily  XARELTO 20 mg tablet Take 20 mg by mouth   No current facility-administered medications on file prior to visit.   Family History  Problem Relation Age of Onset  High blood pressure (Hypertension) Mother  Coronary Artery Disease (Blocked arteries around heart) Mother  Deep vein thrombosis (DVT or abnormal blood clot formation) Mother  Hyperlipidemia (Elevated cholesterol) Father  Breast cancer Sister  Stroke Maternal Grandmother    Social History   Tobacco Use  Smoking Status Every Day  Types: Cigarettes  Smokeless Tobacco Not on file    Social History   Socioeconomic History  Marital status: Married  Tobacco Use  Smoking  status: Every Day  Types: Cigarettes  Vaping Use  Vaping status: Unknown  Substance and Sexual Activity  Alcohol use: Yes   Social Drivers of Health   Food Insecurity: Low Risk (05/25/2023)  Received from [facility]  Hunger Vital Sign  Worried About Running Out of Food in the Last Year: Never true  Ran Out of Food in the Last Year: Never true  Transportation Needs: No Transportation Needs (05/25/2023)  Received from [employer]  In the past 12 months, has lack of reliable transportation kept you from medical appointments, meetings, work or from getting things needed for daily living? : No  Housing Stability: Unknown (09/06/2023)  Housing Stability Vital Sign  Homeless in the Last Year: No   ############################################################  Review of Systems: A complete review of systems (ROS) was obtained from the patient.  We have reviewed this information and discussed as appropriate with the patient.  See HPI as well for other pertinent ROS.  Constitutional: No fevers, chills, sweats. Weight stable Eyes: No vision changes, No discharge HENT: No sore throats, nasal drainage Lymph: No neck swelling, No bruising easily Pulmonary: No cough, productive sputum CV: No orthopnea, PND . No exertional chest/neck/shoulder/arm pain. Patient can walk 30 minutes without difficulty.   GI: No personal nor family history of GI/colon cancer, inflammatory bowel disease, irritable bowel syndrome, allergy such as Celiac Sprue, dietary/dairy problems, colitis, ulcers nor gastritis. No recent sick contacts/gastroenteritis. No travel outside the country. No changes in diet.  Renal: No UTIs, No hematuria Genital: No drainage, bleeding, masses Musculoskeletal: No severe joint pain. Good ROM major joints Skin: No sores or lesions Heme/Lymph: No easy bleeding. No swollen lymph nodes Neuro: No active seizures. No facial droop Psych: No hallucinations. No  agitation  OBJECTIVE   Vitals:  09/06/23 1558  BP: 122/89  Pulse: 100  Temp: 36.3 C (97.3 F)  SpO2: (!) 88%  Weight: 68.9 kg (152 lb)  Height: 166.4 cm (5' 5.5")  PainSc: 8   Body mass index is 24.91 kg/m.  PHYSICAL EXAM:  Constitutional: Not cachectic. Hygeine adequate. Vitals signs as above.  Eyes: Wears glasses - vision corrected,Pupils reactive, normal extraocular movements. Sclera nonicteric Neuro: CN II-XII intact. No major focal sensory defects. No major motor deficits. Lymph: No head/neck/groin lymphadenopathy Psych: No severe agitation. No severe anxiety. Judgment & insight Adequate, Oriented x4, HENT: Normocephalic, Mucus membranes moist. No thrush. Hearing: impaired Neck: Supple, No tracheal deviation. No obvious thyromegaly Chest: No pain to chest wall compression. Good respiratory excursion. No audible wheezing CV: Pulses intact. regular. No major extremity edema Ext: No obvious deformity or contracture. Edema: Not present. No cyanosis Skin: No major subcutaneous nodules. Warm and dry Musculoskeletal: Severe joint rigidity not present. No obvious clubbing. No digital petechiae. Mobility: no assist device moving easily without restrictions  Abdomen: Flat Soft. Nondistended. Nontender. Hernia: Mild swelling supraumbilically but no definite hernia.. Diastasis recti: Mild supraumbilical midline. No hepatomegaly. No splenomegaly.  Genital/Pelvic: Inguinal hernia: Not present. Inguinal lymph nodes: without lymphadenopathy nor hidradenitis.   Rectal:  Perianal skin Mild fecal soiling  Pruritis ani: Mild Pilonidal disease: Not present Condyloma / warts: Not present  Anal fissure: Not present Perirectal  abscess/fistula Not present External hemorrhoids right anterior grade 4 hemorrhoid swollen and irritated with some mild ulceration. Not thrombosed. Not actively bleeding but sensitive.  Digital and anoscopic rectal exam Not tolerated = Aborted  Sphincter tone  Increased  Hemorrhoidal piles Grade 4 prolapsed at right posterior. Some external tags. Not able to do digital internal exam Prostate: N/A Rectovaginal septum: N/A Rectal masses: Not present  Other significant findings: N/A  Patient examined with assistance of female Medical Assistant in the room with patient in decubitus position .  ###################################    ###################################################################  Labs, Imaging and Diagnostic Testing:  Located in 'Care Everywhere' section of Epic EMR chart  PRIOR [facility] CLINIC NOTES:  Not applicable  SURGERY NOTES:  Not applicable  PATHOLOGY:  Not applicable  Assessment and Plan:  DIAGNOSES:  Diagnoses and all orders for this visit:  Prolapsed internal hemorrhoids, grade 4  Anal pain  History of DVT (deep vein thrombosis)  Chronic anticoagulation  Tobacco abuse  Other orders - hydrocortisone (ANUSOL-HC) 2.5 % rectal cream; Apply topically 3 (three) times daily for 10 days    ASSESSMENT/PLAN  50-year-old woman with history of chronic hemorrhoids now with chronically prolapsed right anterior hemorrhoid. It does not look thrombosed or infected or abscess. It is sensitive.  I think she would benefit from anorectal examination under esthesia. Most likely internal hemorrhoid ligation & pexy and hemorrhoidectomy of the most obvious hemorrhoid. This allowed to make sure there are no other issues. I doubt she really has an anal fissure with another etiology but it would be helpful to double check. She is hoping to get narcotics but she already has a problem with a lot of them and I am hesitant to recommend that as that can cause constipation and make things worse. We will just try and expedite surgery now that we have met her as long as there are no major medical concerns did send for higher dose of Anusol cream to see if that might help things more tolerable The anatomy & physiology of the  anorectal region was discussed. The pathophysiology of hemorrhoids and differential diagnosis was discussed. Natural history risks without surgery was discussed. I stressed the importance of a bowel regimen to have daily soft bowel movements to minimize progression of disease. Interventions such as sclerotherapy & banding were discussed.  The patient's symptoms are not adequately controlled by medicines and other non-operative treatments. I feel the risks & problems of no surgery outweigh the operative risks; therefore, I recommended surgery to treat the hemorrhoids by ligation, pexy, and possible resection.  Risks such as bleeding, infection, urinary difficulties, injury to other organs, need for repair of tissues / organs, need for further treatment, heart attack, death, and other risks were discussed. I noted a good likelihood this will help address the problem. Goals of post-operative recovery were discussed as well. Possibility that this will not correct all symptoms was explained. Post-operative pain, bleeding, constipation, and other problems after surgery were discussed. We will work to minimize complications. Educational handouts further explaining the pathology, treatment options, and bowel regimen were given as well. Questions were answered. The patient expresses understanding & wishes to proceed with surgery.  I think it be wise to get a colonoscopy to make sure there are no other issues since she has had rectal bleeding. Would wait 3 months after hemorrhoid surgery before proceeding. She was leaning towards doing Cologuard but will take it under consideration. Referred to Dr. Carmelia Roller with primary care  She has pretty good  performance status with an echocardiogram that was relatively normal just a few years ago. This is not a high risk surgery. Hopefully can just hold her Xarelto 2 days preop and then repeat anticoagulation. However, I do not know if she is truly on the Xarelto and she has a  history of dropping off on her medications. Probably some of her mental health issues and made a challenge to be compliant. To be safe, will ask primary care to make sure they feel like she can tolerate surgery from medical standpoint and she is cleared.  I strong recommend she STOP SMOKING! We talked to the patient about the dangers of smoking. We stressed that tobacco use dramatically increases the risk of peri-operative complications such as infection, tissue necrosis leaving to problems with incision/wound and organ healing, hernia, chronic pain, heart attack, stroke, DVT, pulmonary embolism, and death. We noted there are programs in our community to help stop smoking. Information was available.  Ardeth Sportsman, MD, FACS, MASCRS Esophageal, Gastrointestinal & Colorectal Surgery Robotic and Minimally Invasive Surgery  [facility] [address], [address] [place], [place] [address] [phone] Fax [phone] Main  CONTACT INFORMATION: Weekday (9AM-5PM): Call [facility] main office at [phone] Weeknight (5PM-9AM) or Weekend/Holiday: Check EPIC "Web Links" tab & use "AMION" (password " [email or web address]") for General Surgery [facility] coverage  Please, DO NOT use SecureChat  (it is not reliable communication to reach operating surgeons & will lead to a delay in care).   Epic staff messaging available for outptient concerns needing 1-2 business day response.     10/14/2023

## 2023-10-14 NOTE — Op Note (Signed)
10/14/2023  8:43 AM  PATIENT:  [name]  63 y.o. female  Patient Care Team: Sharlene Dory, DO as PCP - General (Family Medicine) Nahser, Deloris Ping, MD as PCP - Cardiology (Cardiology) Callie Fielding, MD as Consulting Physician (Physical Medicine and Rehabilitation) Barnett Abu, MD as Consulting Physician (Neurosurgery) Karie Soda, MD as Consulting Physician (General Surgery)  PRE-OPERATIVE DIAGNOSIS:  HEMORRHOID - EXTERNAL, HEMORRHOID - INTERNAL GRADE 4 PROLAPSED, and HEMORRHOID - INTERNAL GRADE 3 PROLAPSING  POST-OPERATIVE DIAGNOSIS:   HEMORRHOID - EXTERNAL HEMORRHOID - INTERNAL GRADE 4 PROLAPSED HEMORRHOID - INTERNAL GRADE 3 PROLAPSING  RECTAL POLYP  PROCEDURE:  HEMORRHOIDECTOMY x 3  POLYPECTOMY HEMORRHOIDAL LIGATION & HEMORRHOIDOPEXY  SURGEON:  Ardeth Sportsman, MD  ASSISTANT:  (n/a)   ANESTHESIA:   General endotracheal intubation anesthesia (GETA) and Anorectal and field block for perioperative & postoperative pain control provided with liposomal bupivacaine (Experel) 20mL mixed with 30mL of bupivicaine 0.25% with epinephrine  Estimated Blood Loss (EBL):   No intake/output data recorded..   (See anesthesia record)  Delay start of Pharmacological VTE agent (>24hrs) due to concerns of significant anemia, surgical blood loss, or risk of bleeding?:  no  DRAINS: (None)  SPECIMEN:   Hemorrhoid: Right anterior, Right lateral, and Left lateral Rectal polyp   DISPOSITION OF SPECIMEN:  Pathology  COUNTS:  Sponge, needle, & instrument counts CORRECT  PLAN OF CARE: Discharge to home after PACU  PATIENT DISPOSITION:  PACU - hemodynamically stable.  INDICATION: Pleasant patient with struggles with hemorrhoids.  Not able to be managed in the office despite an improved bowel regimen.  I recommended examination under anesthesia and surgical treatment:  The anatomy & physiology of the anorectal region was discussed.  The pathophysiology of hemorrhoids and  differential diagnosis was discussed.  Natural history risks without surgery was discussed.   I stressed the importance of a bowel regimen to have daily soft bowel movements to minimize progression of disease.  Interventions such as sclerotherapy & banding were discussed.  The patient's symptoms are not adequately controlled by medicines and other non-operative treatments.  I feel the risks & problems of no surgery outweigh the operative risks; therefore, I recommended surgery to treat the hemorrhoids by ligation, pexy, and possible resection.  Risks such as bleeding, infection, need for further treatment, heart attack, death, and other risks were discussed.   I noted a good likelihood this will help address the problem.  Goals of post-operative recovery were discussed as well.  Possibility that this will not correct all symptoms was explained.  Post-operative pain, bleeding, constipation, urinary difficulties, and other problems after surgery were discussed.  We will work to minimize complications.   Educational handouts further explaining the pathology, treatment options, and bowel regimen were given as well.  Questions were answered.  The patient expresses understanding & wishes to proceed with surgery.  OR FINDINGS: Enlarged internal hemorrhoids with right posterior lateral grade 4 chronic prolapse and external tag.  Left lateral grade 4.  Right anterior grade 2.  Hemorrhoidectomy ligation pexy done.  Lobular 5 mm polypoid mass mobile on right lateral rectal wall 6 cm from the anal verge.  Polypectomy done.  Favor hyperplastic over adenomatous.  Rather redundant rectum overall but no true prolapse/intussusception.  No major rectal centimeters.  No fissure or fistula or abscess.  No proctitis.  DESCRIPTION:   Informed consent was confirmed. Patient underwent general anesthesia without difficulty. Patient was placed into  prone/jacknife positioning.  The perianal region was prepped and draped  in  sterile fashion. Surgical time-out confirmed our plan.  I did digital rectal examination and then transitioned over to anoscopy to get a sense of the anatomy.  Findings noted above.   I proceeded to do hemorrhoidal ligation and pexy using a large self-retaining Parks retractor & occasionally alternating with a large International aid/development worker.  I used a 2-0 Vicryl suture on a UR-6 needle in a figure-of-eight fashion 6 cm proximal to the anal verge.  I started at the right anterior pile that seemed the most irritated.  Because of redundant hemorrhoidal tissue too bulky to merely ligate or pexy, I excised the excess internal hemorrhoid piles longitudinally in a fusiform biconcave fashion, sparing the anal canal to avoid narrowing.  I then ran that stitch longitudinally more distally to close the hemorrhoidectomy wound to the anal verge over a large Parks self-retaining anal retractor to avoid narrowing of the anal canal.  I then tied that stitch down to cause a hemorrhoidopexy.   I also had to do an excision at the   right lateral and left lateral pile locations.  Of note on the right lateral rectal wall there was a globular mobile mass that seemed consistent for the polyp.  Favor hyperplastic over adenomatous bedside to do polypectomy.  Able to grab the polyp and excise it using cautery with 2 mm margins to good result.  I used 2-0 Vicryl suture in a figure-of-eight fashion to help close and protect the polypectomy site I then did hemorrhoidal ligation and pexy at the other 3 hemorrhoidal columns.  At the completion of this, all 6 anorectal columns were ligated and pexied in the classic hexagonal fashion (right anterior/lateral/posterior, left anterior/lateral/posterior).    I redid anoscopy and examination.  Hemostasis was good.  I radially trimmed and closed closed the external part of the hemorrhoidectomy wound (right lateral) with radial interrupted horizontal mattress 2-0 chromic suture over a large Sawyer anal  retractor, leaving the last 5 mm open to allow natural drainage.  I allow the right anterior to have some mild separation at the anal verge to avoid narrowing with repeated closure on the same side I repeated anoscopy & examination.  At completion of this, all hemorrhoids had been trimmed down and/or reduced into the rectum.  There is no more prolapse.  Internal & external anatomy was more more normal.  Hemostasis was good.  Fluffed gauze was on-laid over the perianal region.  No packing done.  Patient is being extubated go to go to the recovery room.  I had discussed postop care in detail with the patient in the preop holding area.  Instructions for post-operative recovery and prescriptions are written. I discussed operative findings, updated the patient's status, discussed probable steps to recovery, and gave postoperative recommendations to the patient's spouse, Lashea Goda .  Recommendations were made.  Questions were answered.  He expressed understanding & appreciation.  Ardeth Sportsman, M.D., F.A.C.S. Gastrointestinal and Minimally Invasive Surgery [facility] [address] [place], [place] [address] [phone] Main / Paging

## 2023-10-14 NOTE — Anesthesia Procedure Notes (Signed)
Procedure Name: Intubation Date/Time: 10/14/2023 7:22 AM  Performed by: Laurita Quint, CRNAPre-anesthesia Checklist: Patient identified, Emergency Drugs available, Suction available and Patient being monitored Patient Re-evaluated:Patient Re-evaluated prior to induction Oxygen Delivery Method: Circle System Utilized Preoxygenation: Pre-oxygenation with 100% oxygen Induction Type: IV induction Ventilation: Mask ventilation without difficulty Laryngoscope Size: Miller and 2 Grade View: Grade I Tube type: Oral Tube size: 7.0 mm Number of attempts: 1 Airway Equipment and Method: Stylet and Oral airway Placement Confirmation: ETT inserted through vocal cords under direct vision, positive ETCO2 and breath sounds checked- equal and bilateral Secured at: 22 cm Tube secured with: Tape Dental Injury: Teeth and Oropharynx as per pre-operative assessment

## 2023-10-14 NOTE — Transfer of Care (Signed)
Immediate Anesthesia Transfer of Care Note  Patient: [name]  Procedure(s) Performed: HEMORRHOIDECTOMY WITH LIGATION & HEMORRHOIDOPEXY ANORECTAL EXAM UNDER ANESTHESIA  Patient Location: PACU  Anesthesia Type:General  Level of Consciousness: awake and alert   Airway & Oxygen Therapy: Patient Spontanous Breathing and Patient connected to face mask oxygen  Post-op Assessment: Report given to RN and Post -op Vital signs reviewed and stable  Post vital signs: Reviewed and stable  Last Vitals:  Vitals Value Taken Time  BP 176/88 10/14/23 0852  Temp    Pulse 76 10/14/23 0854  Resp 14 10/14/23 0854  SpO2 100 % 10/14/23 0854  Vitals shown include unfiled device data.  Last Pain:  Vitals:   10/14/23 0559  TempSrc:   PainSc: 8          Complications: No notable events documented.

## 2023-10-14 NOTE — Interval H&P Note (Signed)
History and Physical Interval Note:  10/14/2023 7:08 AM  [name]  has presented today for surgery, with the diagnosis of Hemorrhoids External and Grade 4- prolasped with bleeding and pain.  The various methods of treatment have been discussed with the patient and family. After consideration of risks, benefits and other options for treatment, the patient has consented to  Procedure(s) with comments: HEMORRHOIDECTOMY WITH LIGATION & HEMORRHOIDOPEXY (N/A) - GENERAL & LOCAL ANORECTAL EXAM UNDER ANESTHESIA (N/A) - GENERAL & LOCAL as a surgical intervention.  The patient's history has been reviewed, patient examined, no change in status, stable for surgery.  I have reviewed the patient's chart and labs.  Questions were answered to the patient's satisfaction.    I have re-reviewed the the patient's records, history, medications, and allergies.  I have re-examined the patient.  I again discussed intraoperative plans and goals of post-operative recovery.  The patient agrees to proceed.  [name]  1961/03/18 [id number]  Patient Care Team: Jeanette Dory, DO as PCP - General (Family Medicine) Roberts, Jeanette Ping, MD as PCP - Cardiology (Cardiology) Jeanette Fielding, MD as Consulting Physician (Physical Medicine and Rehabilitation) Jeanette Abu, MD as Consulting Physician (Neurosurgery)  Patient Active Problem List   Diagnosis Date Noted   Dog bite 02/27/2022   Hyponatremia 02/27/2022   Thrombocytosis 02/27/2022   Macrocytic anemia 02/27/2022   Hypothyroidism 02/27/2022   History of DVT (deep vein thrombosis) 02/27/2022   Bipolar disorder (HCC) 02/27/2022   Wound, open, arm, forearm, left, subsequent encounter 02/26/2022   Elbow effusion, left 02/16/2022   Closed fracture of one rib of left side 02/16/2022   Dog bite of right foot 02/16/2022   Closed fracture of left distal tibia 02/16/2022   Frequent falls 09/22/2021   Myofascial pain dysfunction syndrome 04/05/2020    Cervical subluxation 01/30/2020   Chronic otitis externa of right ear 07/27/2019   Dysfunction of right eustachian tube 07/27/2019   Chronic mycotic otitis externa 07/13/2019   Dizziness 10/13/2018   History of mastoiditis 10/13/2018   Vasculitis of skin 08/22/2018   Cervical radiculopathy 06/13/2018   GAD (generalized anxiety disorder) 03/09/2018   Mixed conductive and sensorineural hearing loss of both ears 09/16/2017   Chronic mastoiditis of left side 05/27/2017   Fibromyalgia 06/19/2016   Spondylosis of cervical joint 02/21/2016   Depression 04/09/2015   Gastritis 04/09/2015   Uncomplicated alcohol dependence (HCC) 04/08/2015   Hypertension 08/27/2014   Allodynia 05/17/2013   Chest pain syndrome 04/28/2013   Mild HOCM (hypertrophic obstructive cardiomyopathy) (HCC) 04/28/2013   Palpitations 04/28/2013    Past Medical History:  Diagnosis Date   Asthma    seasonal   Back injury    Chronic   Bipolar disorder (HCC)    Cardiomyopathy (HCC)    hypertension controlled-helped with edema-Dr Roberts   Deafness    Left ear   Degenerative joint disease    with neck surgery   Depression    Diabetes  during pregnancy    Diastolic dysfunction    with possible mild LVOT gradient   Family history of adverse reaction to anesthesia    " my daughter has PONV and gets very grumpy"   Fibromyalgia    neuropathy   Former cigarette smoker    H/O blood clots    due to side effect of medication  DVT   Headache(784.0)    migraines   Hypertension    Hypothyroidism    Leg cramps    Neuropathy  Pain    chronic    Pneumonia    PTSD (post-traumatic stress disorder)    Respiratory disorder    Schizophrenia (HCC)     Past Surgical History:  Procedure Laterality Date   ABDOMINAL HYSTERECTOMY     partial   ANTERIOR CERVICAL DECOMP/DISCECTOMY FUSION N/A 01/30/2020   Procedure: Cervical Three-FourAnterior cervical decompression/discectomy/fusion with Hardware Removal.;  Surgeon: Jeanette Abu, MD;  Location: [facility] OR;  Service: Neurosurgery;  Laterality: N/A;  anterior   APPENDECTOMY     CARDIAC CATHETERIZATION     Ejection Fraction 65-70%   CERVICAL DISC ARTHROPLASTY N/A 02/21/2016   Procedure: Cervical three-four artificial disc replacement;  Surgeon: Jeanette Abu, MD;  Location: [facility];  Service: Neurosurgery;  Laterality: N/A;   CERVICAL FUSION     C5-6   EXTERNAL EAR SURGERY     tumor at age 40, 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   HERNIA REPAIR     " as a baby"   I & D EXTREMITY Left 02/27/2022   Procedure: IRRIGATION AND DEBRIDEMENT FOREARM;  Surgeon: Betha Loa, MD;  Location: [facility] OR;  Service: Orthopedics;  Laterality: Left;   NECK SURGERY     OTHER SURGICAL HISTORY     History of cervical and lumbar disk surgery   PERCUTANEOUS PINNING Left 12/01/2019   Procedure: Closed reduction percutaneous pinning left ring finger proximal phalanx fracture;  Surgeon: Allena Napoleon, MD;  Location: [facility] OR;  Service: Plastics;  Laterality: Left;   polypectomies     tubes   POSTERIOR CERVICAL LAMINECTOMY WITH MET- RX Left 06/13/2018   Procedure: Left Cervical three-four Laminotomy/foraminotomy;  Surgeon: Jeanette Abu, MD;  Location: [facility] OR;  Service: Neurosurgery;  Laterality: Left;   TONSILLECTOMY     TONSILLECTOMY AND ADENOIDECTOMY      Social History   Socioeconomic History   Marital status: Married    Spouse name: [name]   Number of children: 2   Years of education: 12   Highest education level: Not on file  Occupational History    Comment: homemaker  Tobacco Use   Smoking status: Every Day    Current packs/day: 0.50    Average packs/day: 0.5 packs/day for 25.0 years (12.5 ttl pk-yrs)    Types: Cigarettes   Smokeless tobacco: Never  Vaping Use   Vaping status: Never Used  Substance and Sexual Activity   Alcohol use: Not Currently    Comment: very rare   Drug use: No   Sexual activity: Not Currently  Other Topics Concern   Not on file  Social History Narrative    Patient is married [name]) and lives at home with her husband.   Patient has two adult children.   Patient is currently not working.   Patient has a high school education.   Patient is right-handed.   Patient drinks 1-2 cups of coffee per day.   Social Drivers of Corporate investment banker Strain: Not on file  Food Insecurity: Low Risk  (05/25/2023)   Received from [facility]   Hunger Vital Sign    Worried About Running Out of Food in the Last Year: Never true    Ran Out of Food in the Last Year: Never true  Transportation Needs: No Transportation Needs (05/25/2023)   Received from [facility]    In the past 12 months, has lack of reliable transportation kept you from medical appointments, meetings, work or from getting things needed for daily living? : No  Physical Activity: Not on file  Stress: Not on file  Social Connections: Not on file  Intimate Partner Violence: Not on file    Family History  Problem Relation Age of Onset   Coronary artery disease Father    Parkinson's disease Mother    Cancer Sister        breast   Diabetes Sister    Diabetes Brother     Facility-Administered Medications Prior to Admission  Medication Dose Route Frequency Provider Last Rate Last Admin   lidocaine (XYLOCAINE) 1 % (with pres) injection 6 mL  6 mL Other Once Lovorn, Megan, MD       lidocaine (XYLOCAINE) 1 % (with pres) injection 6 mL  6 mL Other Once Lovorn, Aundra Millet, MD       Medications Prior to Admission  Medication Sig Dispense Refill Last Dose/Taking   acetaminophen (TYLENOL) 500 MG tablet Take 1,000 mg by mouth every 6 (six) hours as needed for moderate pain (pain score 4-6), mild pain (pain score 1-3) or headache.   Past Month   ALPRAZolam (XANAX) 1 MG tablet TAKE 1 TABLET (1 MG TOTAL) BY MOUTH IN THE MORNING AND AT BEDTIME. *10/29 60 tablet 2 10/14/2023 at  4:00 AM   baclofen (LIORESAL) 10 MG tablet Take 1 tablet (10 mg total) by mouth 3 (three) times daily. 270  tablet 1  4:00 AM   busPIRone (BUSPAR) 15 MG tablet Take 1 tablet (15 mg total) by mouth 3 (three) times daily. 270 tablet 3 10/13/2023   ciprofloxacin-dexamethasone (CIPRODEX) OTIC suspension Place 4 drops into both ears as needed.   Past Week   citalopram (CELEXA) 20 MG tablet TAKE 1 TABLET BY MOUTH EVERY DAY 90 tablet 0 10/14/2023 Morning   clotrimazole (LOTRIMIN) 1 % external solution Apply 1 Application topically daily.   Past Month   hydrochlorothiazide (HYDRODIURIL) 25 MG tablet TAKE 1 TABLET (25 MG TOTAL) BY MOUTH DAILY. 90 tablet 1 10/14/2023 Morning   ketoconazole (NIZORAL) 2 % cream Apply 1 Application topically daily.   10/13/2023   lidocaine (XYLOCAINE) 5 % ointment Apply 1 Application topically 3 (three) times daily as needed for moderate pain (pain score 4-6). 35.44 g 0 Past Week   loratadine (CLARITIN) 10 MG tablet Take 10 mg by mouth 3 (three) times daily as needed for allergies (for seasonal allergies).   10/13/2023   metoprolol tartrate (LOPRESSOR) 25 MG tablet TAKE 1 TABLET BY MOUTH TWICE A DAY 180 tablet 3 10/14/2023 at  4:00 AM   Nitroglycerin 0.4 % OINT Apply 1 inch (375 mg) ointment intra-anally every 12 hours for anal fissure (Patient not taking: Reported on 10/12/2023) 30 g 0 Taking   risperiDONE (RISPERDAL) 1 MG tablet TAKE 1 TABLET BY MOUTH EVERYDAY AT BEDTIME 90 tablet 3 10/13/2023   tiZANidine (ZANAFLEX) 4 MG tablet Take 2 tablets (8 mg total) by mouth 3 (three) times daily. 540 tablet 1  4:00 AM   triamcinolone cream (KENALOG) 0.1 % Apply 1 Application topically 2 (two) times daily. 30 g 0 10/13/2023   XARELTO 20 MG TABS tablet TAKE 1 TABLET BY MOUTH DAILY WITH SUPPER 30 tablet 3 10/11/2023 at  7:00 PM   bisacodyl (DULCOLAX) 5 MG EC tablet Take 5 mg by mouth 2 (two) times daily as needed for moderate constipation.    Unknown   hydrocortisone (ANUSOL-HC) 25 MG suppository Place 1 suppository (25 mg total) rectally 2 (two) times daily. 24 suppository 0 Not Taking   hydrOXYzine  (ATARAX/VISTARIL) 25 MG tablet  TAKE 3 TO 4 TABLETS BY MOUTH EVERY 8 HOURS AS NEEDED FOR ANXIETY. MAX AMOUNT PER INSURANCE 360 tablet 2 Not Taking   levothyroxine (SYNTHROID) 50 MCG tablet TAKE 1 TABLET BY MOUTH EVERY DAY 90 tablet 0 Not Taking   lidocaine (LIDODERM) 5 % Place 1 patch onto the skin daily as needed (pain).   More than a month    Current Facility-Administered Medications  Medication Dose Route Frequency Provider Last Rate Last Admin   bupivacaine liposome (EXPAREL) 1.3 % injection 266 mg  20 mL Infiltration Once Karie Soda, MD       Chlorhexidine Gluconate Cloth 2 % PADS 6 each  6 each Topical Once Karie Soda, MD       And   Chlorhexidine Gluconate Cloth 2 % PADS 6 each  6 each Topical Once Karie Soda, MD       lactated ringers infusion   Intravenous Continuous Elmer Picker, MD 10 mL/hr at 10/14/23 0550 New Bag at 10/14/23 0550     Allergies  Allergen Reactions   Morphine And Codeine Other (See Comments)    "Didn't agree with me/wasn't very effective.."   Lyrica [Pregabalin] Rash   Neurontin [Gabapentin] Other (See Comments)    Left patient sleepy all the time    BP (S) 94/61 Comment: Pt reports taking baclofen, tizanidine, xanax, hydrochlorothiazide and metoprolol prior to arrival.  Pulse (!) 50   Temp 97.8 F (36.6 C) (Oral)   Resp 17   Ht 5\' 5"  (1.651 m)   Wt 65.3 kg   SpO2 96%   BMI 23.96 kg/m   Labs: No results found for this or any previous visit (from the past 48 hours).  Imaging / Studies: No results found.   Ardeth Sportsman, M.D., F.A.C.S. Gastrointestinal and Minimally Invasive Surgery [facility] [address] [place], [place] [address] [phone] Main / Paging  10/14/2023 7:08 AM    Ardeth Sportsman

## 2023-10-15 ENCOUNTER — Other Ambulatory Visit: Payer: Self-pay | Admitting: Family Medicine

## 2023-10-15 ENCOUNTER — Encounter (HOSPITAL_COMMUNITY): Payer: Self-pay | Admitting: Surgery

## 2023-10-15 LAB — SURGICAL PATHOLOGY

## 2023-11-10 ENCOUNTER — Ambulatory Visit (HOSPITAL_COMMUNITY): Payer: BLUE CROSS/BLUE SHIELD

## 2023-11-25 ENCOUNTER — Encounter: Payer: Self-pay | Admitting: Adult Health

## 2023-11-25 ENCOUNTER — Ambulatory Visit: Payer: BLUE CROSS/BLUE SHIELD | Admitting: Adult Health

## 2023-11-25 DIAGNOSIS — F3181 Bipolar II disorder: Secondary | ICD-10-CM

## 2023-11-25 DIAGNOSIS — F410 Panic disorder [episodic paroxysmal anxiety] without agoraphobia: Secondary | ICD-10-CM

## 2023-11-25 DIAGNOSIS — F411 Generalized anxiety disorder: Secondary | ICD-10-CM

## 2023-11-25 DIAGNOSIS — F4310 Post-traumatic stress disorder, unspecified: Secondary | ICD-10-CM | POA: Diagnosis not present

## 2023-11-25 MED ORDER — ALPRAZOLAM 1 MG PO TABS: 1.0000 mg | ORAL_TABLET | Freq: Two times a day (BID) | ORAL | 2 refills | Status: DC | PRN

## 2023-11-25 NOTE — Progress Notes (Signed)
 [name] [id number] [birth year] 63 y.o.  Virtual Visit via Telephone Note  I connected with pt on 11/25/23 at  5:00 PM EDT by telephone and verified that I am speaking with the correct person using two identifiers.   I discussed the limitations, risks, security and privacy concerns of performing an evaluation and management service by telephone and the availability of in person appointments. I also discussed with the patient that there may be a patient responsible charge related to this service. The patient expressed understanding and agreed to proceed.   I discussed the assessment and treatment plan with the patient. The patient was provided an opportunity to ask questions and all were answered. The patient agreed with the plan and demonstrated an understanding of the instructions.   The patient was advised to call back or seek an in-person evaluation if the symptoms worsen or if the condition fails to improve as anticipated.  I provided 25 minutes of non-face-to-face time during this encounter.  The patient was located at home.  The provider was located at [facility].   Jeanette Gibbs, NP   Subjective:   Patient ID:  [name] is a 63 y.o. (DOB 03-23-1961) female.  Chief Complaint: No chief complaint on file.   HPI GWENDOLIN BRIEL presents for follow-up of GAD, PTSD, panic attacks and BPD 2.   Describes mood today as "ok". Pleasant. Reports tearfulness. Mood symptoms - reports increased depression, anxiety and irritability. Reports varying interest and motivation. Reports panic attacks 3 times a week. Reports worry, rumination and over thinking. Reports chronic pain issues - working with pain management. Reports recovering from a hemmarhoid surgery on February12th. Mood is variable. Stating "overall, I feel like I'm making it". Feels like medications as helpful. Taking medications as prescribed.  Energy levels lower. Active, does not have a regular  exercise routine.  Enjoys some usual interests and activities. Married. Lives with husband. Talking to children - 1 son and 1 daughter. Spending time with family. Appetite adequate. Weight loss - 144 pounds. Sleeps well most nights. Averages 5 to 6 hours. Taking one nap a day.  Focus and concentration stable. Completing tasks. Managing aspects of household. Disabled. Denies SI or HI.  Denies AH or VH. Denies self harm. Denies substance use.  Previous medications - Abilify, Wellbutrin, Gabapentin, Lyrica, Prozac      Review of Systems:  Review of Systems  Musculoskeletal:  Negative for gait problem.  Neurological:  Negative for tremors.  Psychiatric/Behavioral:         Please refer to HPI    Medications: I have reviewed the patient's current medications.  Current Outpatient Medications  Medication Sig Dispense Refill   acetaminophen (TYLENOL) 500 MG tablet Take 1,000 mg by mouth every 6 (six) hours as needed for moderate pain (pain score 4-6), mild pain (pain score 1-3) or headache.     ALPRAZolam (XANAX) 1 MG tablet Take 1 tablet (1 mg total) by mouth 2 (two) times daily as needed for anxiety. 60 tablet 2   baclofen (LIORESAL) 10 MG tablet Take 1 tablet (10 mg total) by mouth 3 (three) times daily. 270 tablet 1   bisacodyl (DULCOLAX) 5 MG EC tablet Take 5 mg by mouth 2 (two) times daily as needed for moderate constipation.      busPIRone (BUSPAR) 15 MG tablet Take 1 tablet (15 mg total) by mouth 3 (three) times daily. 270 tablet 3   ciprofloxacin-dexamethasone (CIPRODEX) OTIC suspension Place 4 drops into both ears  as needed.     citalopram (CELEXA) 20 MG tablet TAKE 1 TABLET BY MOUTH EVERY DAY 90 tablet 0   clotrimazole (LOTRIMIN) 1 % external solution Apply 1 Application topically daily.     hydrochlorothiazide (HYDRODIURIL) 25 MG tablet TAKE 1 TABLET (25 MG TOTAL) BY MOUTH DAILY. 90 tablet 1   hydrocortisone (ANUSOL-HC) 25 MG suppository Place 1 suppository (25 mg total)  rectally 2 (two) times daily. 24 suppository 0   hydrOXYzine (ATARAX/VISTARIL) 25 MG tablet TAKE 3 TO 4 TABLETS BY MOUTH EVERY 8 HOURS AS NEEDED FOR ANXIETY. MAX AMOUNT PER INSURANCE 360 tablet 2   ketoconazole (NIZORAL) 2 % cream Apply 1 Application topically daily.     levothyroxine (SYNTHROID) 50 MCG tablet TAKE 1 TABLET BY MOUTH EVERY DAY 90 tablet 0   lidocaine (LIDODERM) 5 % Place 1 patch onto the skin daily as needed (pain).     lidocaine (XYLOCAINE) 5 % ointment Apply 1 Application topically 3 (three) times daily as needed for moderate pain (pain score 4-6). 35.44 g 0   loratadine (CLARITIN) 10 MG tablet Take 10 mg by mouth 3 (three) times daily as needed for allergies (for seasonal allergies).     metoprolol tartrate (LOPRESSOR) 25 MG tablet TAKE 1 TABLET BY MOUTH TWICE A DAY 180 tablet 3   Nitroglycerin 0.4 % OINT Apply 1 inch (375 mg) ointment intra-anally every 12 hours for anal fissure (Patient not taking: Reported on 10/12/2023) 30 g 0   oxyCODONE (OXY IR/ROXICODONE) 5 MG immediate release tablet Take 1 tablet (5 mg total) by mouth every 6 (six) hours as needed for severe pain (pain score 7-10) or breakthrough pain. 30 tablet 0   risperiDONE (RISPERDAL) 1 MG tablet TAKE 1 TABLET BY MOUTH EVERYDAY AT BEDTIME 90 tablet 3   tiZANidine (ZANAFLEX) 4 MG tablet Take 2 tablets (8 mg total) by mouth 3 (three) times daily. 540 tablet 1   triamcinolone cream (KENALOG) 0.1 % Apply 1 Application topically 2 (two) times daily. 30 g 0   XARELTO 20 MG TABS tablet TAKE 1 TABLET BY MOUTH DAILY WITH SUPPER 30 tablet 3   Current Facility-Administered Medications  Medication Dose Route Frequency Provider Last Rate Last Admin   lidocaine (XYLOCAINE) 1 % (with pres) injection 6 mL  6 mL Other Once Lovorn, Megan, MD       lidocaine (XYLOCAINE) 1 % (with pres) injection 6 mL  6 mL Other Once Lovorn, Aundra Millet, MD        Medication Side Effects: None  Allergies:  Allergies  Allergen Reactions   Morphine And  Codeine Other (See Comments)    "Didn't agree with me/wasn't very effective.."   Lyrica [Pregabalin] Rash   Neurontin [Gabapentin] Other (See Comments)    Left patient sleepy all the time    Past Medical History:  Diagnosis Date   Asthma    seasonal   Back injury    Chronic   Bipolar disorder (HCC)    Cardiomyopathy (HCC)    hypertension controlled-helped with edema-Dr Nahser   Deafness    Left ear   Degenerative joint disease    with neck surgery   Depression    Diabetes  during pregnancy    Diastolic dysfunction    with possible mild LVOT gradient   Family history of adverse reaction to anesthesia    " my daughter has PONV and gets very grumpy"   Fibromyalgia    neuropathy   Former cigarette smoker    H/O blood  clots    due to side effect of medication  DVT   Headache(784.0)    migraines   Hypertension    Hypothyroidism    Leg cramps    Neuropathy    Pain    chronic    Pneumonia    PTSD (post-traumatic stress disorder)    Respiratory disorder    Schizophrenia (HCC)     Family History  Problem Relation Age of Onset   Coronary artery disease Father    Parkinson's disease Mother    Cancer Sister        breast   Diabetes Sister    Diabetes Brother     Social History   Socioeconomic History   Marital status: Married    Spouse name: Reuel Boom   Number of children: 2   Years of education: 12   Highest education level: Not on file  Occupational History    Comment: homemaker  Tobacco Use   Smoking status: Every Day    Current packs/day: 0.50    Average packs/day: 0.5 packs/day for 25.0 years (12.5 ttl pk-yrs)    Types: Cigarettes   Smokeless tobacco: Never  Vaping Use   Vaping status: Never Used  Substance and Sexual Activity   Alcohol use: Not Currently    Comment: very rare   Drug use: No   Sexual activity: Not Currently  Other Topics Concern   Not on file  Social History Narrative   Patient is married Multimedia programmer) and lives at home with her husband.    Patient has two adult children.   Patient is currently not working.   Patient has a high school education.   Patient is right-handed.   Patient drinks 1-2 cups of coffee per day.   Social Drivers of Corporate investment banker Strain: Not on file  Food Insecurity: Low Risk  (05/25/2023)   Received from [facility]   Hunger Vital Sign    Worried About Running Out of Food in the Last Year: Never true    Ran Out of Food in the Last Year: Never true  Transportation Needs: No Transportation Needs (05/25/2023)   Received from [facility]    In the past 12 months, has lack of reliable transportation kept you from medical appointments, meetings, work or from getting things needed for daily living? : No  Physical Activity: Not on file  Stress: Not on file  Social Connections: Not on file  Intimate Partner Violence: Not on file    Past Medical History, Surgical history, Social history, and Family history were reviewed and updated as appropriate.   Please see review of systems for further details on the patient's review from today.   Objective:   Physical Exam:  There were no vitals taken for this visit.  Physical Exam Constitutional:      General: She is not in acute distress. Musculoskeletal:        General: No deformity.  Neurological:     Mental Status: She is alert and oriented to person, place, and time.     Coordination: Coordination normal.  Psychiatric:        Attention and Perception: Attention and perception normal. She does not perceive auditory or visual hallucinations.        Mood and Affect: Affect is not labile, blunt, angry or inappropriate.        Speech: Speech normal.        Behavior: Behavior normal.  Thought Content: Thought content normal. Thought content is not paranoid or delusional. Thought content does not include homicidal or suicidal ideation. Thought content does not include homicidal or suicidal plan.        Cognition and  Memory: Cognition and memory normal.        Judgment: Judgment normal.     Comments: Insight intact     Lab Review:     Component Value Date/Time   NA 128 (L) 10/05/2023 1453   K 3.8 10/05/2023 1453   CL 87 (L) 10/05/2023 1453   CO2 29 10/05/2023 1453   GLUCOSE 113 (H) 10/05/2023 1453   BUN 5 (L) 10/05/2023 1453   CREATININE 0.61 10/05/2023 1453   CREATININE 0.69 09/14/2023 1543   CALCIUM 9.4 10/05/2023 1453   PROT 6.0 (L) 02/27/2022 0507   ALBUMIN 3.0 (L) 02/27/2022 0507   AST 12 (L) 02/27/2022 0507   ALT 14 02/27/2022 0507   ALKPHOS 68 02/27/2022 0507   BILITOT 0.6 02/27/2022 0507   GFRNONAA >60 10/05/2023 1453   GFRAA >60 01/26/2020 0839       Component Value Date/Time   WBC 8.0 09/14/2023 1543   RBC 4.17 09/14/2023 1543   HGB 14.2 09/14/2023 1543   HCT 41.8 09/14/2023 1543   PLT 382 09/14/2023 1543   MCV 100.2 (H) 09/14/2023 1543   MCH 34.1 (H) 09/14/2023 1543   MCHC 34.0 09/14/2023 1543   RDW 12.7 09/14/2023 1543   LYMPHSABS 0.5 (L) 02/28/2022 0113   MONOABS 0.2 02/28/2022 0113   EOSABS 0.0 02/28/2022 0113   BASOSABS 0.0 02/28/2022 0113    No results found for: "POCLITH", "LITHIUM"   No results found for: "PHENYTOIN", "PHENOBARB", "VALPROATE", "CBMZ"   .res Assessment: Plan:    Plan:  Buspar 15mg  TID Xanax 1mg  BID  Celexa 20mg  daily Risperdal 1mg  at bedtime   6 months  25 minutes spent dedicated to the care of this patient on the date of this encounter to include pre-visit review of records, ordering of medication, post visit documentation, and face-to-face time with the patient discussing GAD, PTSD, panic attacks and BPD 2. Discussed continuing current medication regimen.  Therapy - Rockne Menghini  Patient advised to contact office with any questions, adverse effects, or acute worsening in signs and symptoms.  Discussed potential benefits, risk, and side effects of benzodiazepines to include potential risk of tolerance and dependence, as well as  possible drowsiness.  Advised patient not to drive if experiencing drowsiness and to take lowest possible effective dose to minimize risk of dependence and tolerance.  Discussed potential metabolic side effects associated with atypical antipsychotics, as well as potential risk for movement side effects. Advised pt to contact office if movement side effects occur.    Diagnoses and all orders for this visit:  Bipolar II disorder (HCC)  GAD (generalized anxiety disorder) -     ALPRAZolam (XANAX) 1 MG tablet; Take 1 tablet (1 mg total) by mouth 2 (two) times daily as needed for anxiety.  PTSD (post-traumatic stress disorder)  Panic attacks -     ALPRAZolam (XANAX) 1 MG tablet; Take 1 tablet (1 mg total) by mouth 2 (two) times daily as needed for anxiety.    Please see After Visit Summary for patient specific instructions.  Future Appointments  Date Time Provider Department Center  12/03/2023  2:45 PM [id number]  12/24/2023  3:00 PM Lovorn, Aundra Millet, MD [facility]    No orders of the defined types were  placed in this encounter.     -------------------------------

## 2023-12-03 ENCOUNTER — Ambulatory Visit (HOSPITAL_COMMUNITY)

## 2023-12-13 ENCOUNTER — Other Ambulatory Visit: Payer: Self-pay | Admitting: Adult Health

## 2023-12-13 DIAGNOSIS — F410 Panic disorder [episodic paroxysmal anxiety] without agoraphobia: Secondary | ICD-10-CM

## 2023-12-13 DIAGNOSIS — F411 Generalized anxiety disorder: Secondary | ICD-10-CM

## 2023-12-24 ENCOUNTER — Encounter: Payer: BLUE CROSS/BLUE SHIELD | Admitting: Physical Medicine and Rehabilitation

## 2023-12-26 ENCOUNTER — Other Ambulatory Visit: Payer: Self-pay | Admitting: Adult Health

## 2023-12-26 DIAGNOSIS — F411 Generalized anxiety disorder: Secondary | ICD-10-CM

## 2023-12-30 ENCOUNTER — Ambulatory Visit (HOSPITAL_COMMUNITY): Attending: Cardiology

## 2023-12-30 DIAGNOSIS — I447 Left bundle-branch block, unspecified: Secondary | ICD-10-CM | POA: Diagnosis not present

## 2023-12-30 LAB — ECHOCARDIOGRAM COMPLETE
Area-P 1/2: 4.8 cm2
S' Lateral: 3.5 cm

## 2024-01-03 ENCOUNTER — Telehealth: Payer: Self-pay | Admitting: Emergency Medicine

## 2024-01-03 NOTE — Telephone Encounter (Signed)
 Croitoru, Mihai, MD to Jobe Mulder, DO  Me    12/31/23 12:04 PM Result Note Minimal abnormalities are seen on the echocardiogram.  The very slight reduction in ejection fraction is related to the left bundle branch block which causes dyssynchrony of contraction.  No indication for any further workup or change in treatment at this time. ECHOCARDIOGRAM COMPLETE Croitoru, Karyl Paget, MD to Jobe Mulder, DO  Me (Selected Message)    12/31/23  8:14 AM Result Note The echocardiogram is unremarkable.  The borderline reduction in heart pumping strength is actually related to the left bundle branch block, but otherwise there is no sign of any significant structural heart problems.  No reason to change therapy.  Left message with the information above (dpr). Callback number given for any questions.

## 2024-01-05 ENCOUNTER — Encounter: Attending: Physical Medicine and Rehabilitation | Admitting: Physical Medicine and Rehabilitation

## 2024-01-05 ENCOUNTER — Encounter: Payer: Self-pay | Admitting: Physical Medicine and Rehabilitation

## 2024-01-05 VITALS — BP 179/92 | HR 76 | Ht 65.0 in | Wt 137.6 lb

## 2024-01-05 DIAGNOSIS — Z79891 Long term (current) use of opiate analgesic: Secondary | ICD-10-CM | POA: Diagnosis not present

## 2024-01-05 DIAGNOSIS — G894 Chronic pain syndrome: Secondary | ICD-10-CM | POA: Diagnosis not present

## 2024-01-05 DIAGNOSIS — M7918 Myalgia, other site: Secondary | ICD-10-CM | POA: Insufficient documentation

## 2024-01-05 DIAGNOSIS — Z5181 Encounter for therapeutic drug level monitoring: Secondary | ICD-10-CM | POA: Diagnosis not present

## 2024-01-05 MED ORDER — LIDOCAINE HCL 1 % IJ SOLN: 3.0000 mL | Freq: Once | INTRAMUSCULAR | Status: AC

## 2024-01-05 MED ORDER — HYDROCODONE-ACETAMINOPHEN 5-325 MG PO TABS: 1.0000 | ORAL_TABLET | Freq: Three times a day (TID) | ORAL | 0 refills | Status: DC | PRN

## 2024-01-05 MED ORDER — TIZANIDINE HCL 4 MG PO TABS: 8.0000 mg | ORAL_TABLET | Freq: Three times a day (TID) | ORAL | 1 refills | Status: DC

## 2024-01-05 NOTE — Progress Notes (Signed)
 Patient is a 63 yr old female with hx of multiple spinal surgeries- all surgeries on neck and neck and back pain, fibromyalgia, and previous L carpal tunnel surgery- also hx of bipolar II d/o;  Here for trigger point injections and f/u on chronic pain.       Things are "crazy"-  Husband had seizure at work and fell and cracked vertebrae and cracked head open-   Wouldn't do as worker's comp- and in "mood" every single day. Working from home.   Pt doesn't drive and now husband cannot drive.     Did have hemorrhoid surgery- went well, but healing taking longer than expected, - is doing much better- off pain meds.   Still smoking cigarettes-   We decided to try and do pain meds- Tramadol  doesn't work at all for her- and makes her nauseated- would rather not take anything than Tramadol .   Asking if we can start Norco- which took after surgery, and was very effective.    Been awhile since saw her since cannot drive and husband cannot drive.     Plan: 1. Will do opiate contract and UDS  2.  Will try Norco 5/325 mg 3x/day as needed for pain- #21- 1 week supply-    3. Patient here for trigger point injections for  Consent done and on chart.  Cleaned areas with alcohol and injected using a 27 gauge 1.5 inch needle  Injected 3cc- none wasted Using 1% Lidocaine  with no EPI  Upper traps B.L Levators B/L  Posterior scalenes Middle scalenes- B/L  Splenius Capitus- B/L  Pectoralis Major- doesn't want done Rhomboids Infraspinatus Teres Major/minor Thoracic paraspinals Lumbar paraspinals Other injections-    Patient's level of pain prior was Current level of pain after injections is  There was no bleeding or complications.  Patient was advised to drink a lot of water on day after injections to flush system Will have increased soreness for 12-48 hours after injections.  Can use Lidocaine  patches the day AFTER injections Can use theracane on day of injections in places didn't  inject Can use heating pad 4-6 hours AFTER injections   4. Con't Tizanidine - 8 mg  3x/day- for muscle spasms- - last refill 10/01/23- but will run out before sees her next, so will send in.   5. F/U in  3 months- and wait for 6-8 weeks- q2 months.  Have to see me every 2 months per clinic policy   I spent a total of  28  minutes on total care today- >50% coordination of care- due to 20 minutes going over pain meds and plan- opiate contract and UDS etc- however also 6 minute son TrP inj.

## 2024-01-05 NOTE — Patient Instructions (Addendum)
 Plan: 1. Will do opiate contract and UDS  2.  Will try Norco 5/325 mg 3x/day as needed for pain- #21- 1 week supply-    3. Patient here for trigger point injections for  Consent done and on chart.  Cleaned areas with alcohol and injected using a 27 gauge 1.5 inch needle  Injected 3cc- none wasted Using 1% Lidocaine  with no EPI  Upper traps B.L Levators B/L  Posterior scalenes Middle scalenes- B/L  Splenius Capitus- B/L  Pectoralis Major- doesn't want done Rhomboids Infraspinatus Teres Major/minor Thoracic paraspinals Lumbar paraspinals Other injections-    Patient's level of pain prior was Current level of pain after injections is  There was no bleeding or complications.  Patient was advised to drink a lot of water on day after injections to flush system Will have increased soreness for 12-48 hours after injections.  Can use Lidocaine  patches the day AFTER injections Can use theracane on day of injections in places didn't inject Can use heating pad 4-6 hours AFTER injections   4. Con't Tizanidine - 8 mg  3x/day- for muscle spasms- - last refill 10/01/23- but will run out before sees her next, so will send in.   5. F/U in  3 months- and wait for 6-8 weeks- q2 months.  Have to see me every 2 months per clinic policy

## 2024-01-05 NOTE — Addendum Note (Signed)
 Addended by: Aydenn Gervin W on: 01/05/2024 10:39 AM   Modules accepted: Orders

## 2024-01-12 LAB — DRUG TOX MONITOR 1 W/CONF, ORAL FLD
Alprazolam: 11.83 ng/mL — ABNORMAL HIGH (ref <0.50)
Aminoclonazepam: NEGATIVE ng/mL (ref <0.50)
Amphetamines: NEGATIVE ng/mL (ref <10)
Barbiturates: NEGATIVE ng/mL (ref <10)
Benzodiazepines: POSITIVE ng/mL — AB (ref <0.50)
Buprenorphine: NEGATIVE ng/mL (ref <0.10)
Chlordiazepoxide: NEGATIVE ng/mL (ref <0.50)
Clonazepam: NEGATIVE ng/mL (ref <0.50)
Cocaine: NEGATIVE ng/mL (ref <5.0)
Cotinine: >250 ng/mL — ABNORMAL HIGH (ref <5.0)
Diazepam: NEGATIVE ng/mL (ref <0.50)
Fentanyl: NEGATIVE ng/mL (ref <0.10)
Flunitrazepam: NEGATIVE ng/mL (ref <0.50)
Flurazepam: NEGATIVE ng/mL (ref <0.50)
Heroin Metabolite: NEGATIVE ng/mL (ref <1.0)
Lorazepam: NEGATIVE ng/mL (ref <0.50)
MARIJUANA: NEGATIVE ng/mL (ref <2.5)
MDMA: NEGATIVE ng/mL (ref <10)
Meprobamate: NEGATIVE ng/mL (ref <2.5)
Methadone: NEGATIVE ng/mL (ref <5.0)
Midazolam: NEGATIVE ng/mL (ref <0.50)
Nicotine Metabolite: POSITIVE ng/mL — AB (ref <5.0)
Nordiazepam: NEGATIVE ng/mL (ref <0.50)
Opiates: NEGATIVE ng/mL (ref <2.5)
Oxazepam: NEGATIVE ng/mL (ref <0.50)
Phencyclidine: NEGATIVE ng/mL (ref <10)
Tapentadol: NEGATIVE ng/mL (ref <5.0)
Temazepam: NEGATIVE ng/mL (ref <0.50)
Tramadol: NEGATIVE ng/mL (ref <5.0)
Triazolam: NEGATIVE ng/mL (ref <0.50)
Zolpidem: NEGATIVE ng/mL (ref <5.0)

## 2024-01-12 LAB — DRUG TOX ALC METAB W/CON, ORAL FLD: Alcohol Metabolite: NEGATIVE ng/mL (ref <25)

## 2024-01-13 ENCOUNTER — Ambulatory Visit: Payer: Self-pay | Admitting: Physical Medicine and Rehabilitation

## 2024-01-13 NOTE — Telephone Encounter (Signed)
 L.M for pt to let me know how Norco is helping for her.   Her UDS came back (-) so no concerns on that front.

## 2024-01-14 ENCOUNTER — Telehealth: Payer: Self-pay | Admitting: Physical Medicine and Rehabilitation

## 2024-01-14 MED ORDER — HYDROCODONE-ACETAMINOPHEN 7.5-325 MG PO TABS: 1.0000 | ORAL_TABLET | Freq: Four times a day (QID) | ORAL | 0 refills | Status: DC | PRN

## 2024-01-14 NOTE — Telephone Encounter (Signed)
 Pt missed call from Dr. Raynaldo Call yesterday, would like to follow up with her ASAP regarding medication and UDS results

## 2024-01-14 NOTE — Telephone Encounter (Signed)
 Dr. Raynaldo Call has spoken with Domenick Friedlander today.

## 2024-03-03 ENCOUNTER — Telehealth: Payer: Self-pay

## 2024-03-03 ENCOUNTER — Other Ambulatory Visit: Payer: Self-pay | Admitting: Family Medicine

## 2024-03-03 MED ORDER — HYDROCODONE-ACETAMINOPHEN 7.5-325 MG PO TABS: 1.0000 | ORAL_TABLET | Freq: Four times a day (QID) | ORAL | 0 refills | Status: DC | PRN

## 2024-03-03 NOTE — Telephone Encounter (Signed)
 Sent in pain meds Norco 7.5 mg 4x/day- #120-

## 2024-03-08 ENCOUNTER — Other Ambulatory Visit: Payer: Self-pay | Admitting: Adult Health

## 2024-03-08 DIAGNOSIS — F410 Panic disorder [episodic paroxysmal anxiety] without agoraphobia: Secondary | ICD-10-CM

## 2024-03-08 DIAGNOSIS — F411 Generalized anxiety disorder: Secondary | ICD-10-CM

## 2024-03-08 NOTE — Telephone Encounter (Signed)
 Please call to schedule FU, due now.

## 2024-03-09 NOTE — Telephone Encounter (Signed)
 Pt has appt 8/1 and wants to make sure refill is sent because she will be out this weekend.     [facility] [facility]

## 2024-03-09 NOTE — Telephone Encounter (Signed)
 LVM to sch

## 2024-03-24 ENCOUNTER — Encounter: Payer: Self-pay | Admitting: Adult Health

## 2024-03-24 ENCOUNTER — Ambulatory Visit: Admitting: Adult Health

## 2024-03-24 DIAGNOSIS — F410 Panic disorder [episodic paroxysmal anxiety] without agoraphobia: Secondary | ICD-10-CM

## 2024-03-24 DIAGNOSIS — F3181 Bipolar II disorder: Secondary | ICD-10-CM | POA: Diagnosis not present

## 2024-03-24 DIAGNOSIS — F411 Generalized anxiety disorder: Secondary | ICD-10-CM

## 2024-03-24 DIAGNOSIS — F4310 Post-traumatic stress disorder, unspecified: Secondary | ICD-10-CM

## 2024-03-24 MED ORDER — CITALOPRAM HYDROBROMIDE 20 MG PO TABS: 20.0000 mg | ORAL_TABLET | Freq: Every day | ORAL | 3 refills | Status: AC

## 2024-03-24 MED ORDER — ALPRAZOLAM 1 MG PO TABS: 1.0000 mg | ORAL_TABLET | Freq: Two times a day (BID) | ORAL | 2 refills | Status: DC | PRN

## 2024-03-24 MED ORDER — BUSPIRONE HCL 15 MG PO TABS: 15.0000 mg | ORAL_TABLET | Freq: Three times a day (TID) | ORAL | 3 refills | Status: AC

## 2024-03-24 MED ORDER — RISPERIDONE 1 MG PO TABS: ORAL_TABLET | ORAL | 3 refills | Status: AC

## 2024-03-24 NOTE — Progress Notes (Signed)
 [name] [id number] 1960/12/05 63 y.o.  Virtual Visit via Telephone Note   I connected with pt on 03/24/24 at 08:30 AM EDT by telephone and verified that I am speaking with the correct person using two identifiers.   I discussed the limitations, risks, security and privacy concerns of performing an evaluation and management service by telephone and the availability of in person appointments. I also discussed with the patient that there may be a patient responsible charge related to this service. The patient expressed understanding and agreed to proceed.   I discussed the assessment and treatment plan with the patient. The patient was provided an opportunity to ask questions and all were answered. The patient agreed with the plan and demonstrated an understanding of the instructions.   The patient was advised to call back or seek an in-person evaluation if the symptoms worsen or if the condition fails to improve as anticipated.   I provided 25 minutes of non-face-to-face time during this encounter.  The patient was located at home.  The provider was located at [facility].     Angeline LOISE Sayers, NP Subjective:   Patient ID:  [name] is a 63 y.o. (DOB [birth year]) female.  Chief Complaint: No chief complaint on file.   HPI [name] presents for follow-up of GAD, PTSD, panic attacks and BPD 2.   Describes mood today as ok. Pleasant. Reports tearfulness. Mood symptoms - reports depression, anxiety and irritability. Reports lower interest and motivation - physically issues. Reports daily panic attacks. Reports decreased worry, rumination and over thinking. Denies obsessive thoughts or acts. Reports chronic pain issues - working with pain management. Mood is variable. Stating I feel like I'm doing the best I can. Feels like medications as helpful. Taking medications as prescribed.  Energy levels lower. Active, does not have a regular exercise routine  with physical limitations.  Enjoys some usual interests and activities. Married. Lives with husband. Talking to children - 1 son and 1 daughter. Spending time with family. Appetite adequate. Weight loss - 130 pounds. Sleeps well most nights. Averages 5 to 6 hours. Taking two naps a day.  Focus and concentration stable - not too bad. Completing tasks. Managing aspects of household. Disabled. Denies SI or HI.  Denies AH or VH. Denies self harm. Denies substance use.  Previous medications - Abilify, Wellbutrin, Gabapentin, Lyrica, Prozac     Review of Systems:  Review of Systems  Musculoskeletal:  Negative for gait problem.  Neurological:  Negative for tremors.  Psychiatric/Behavioral:         Please refer to HPI    Medications: I have reviewed the patient's current medications.  Current Outpatient Medications  Medication Sig Dispense Refill   acetaminophen  (TYLENOL ) 500 MG tablet Take 1,000 mg by mouth every 6 (six) hours as needed for moderate pain (pain score 4-6), mild pain (pain score 1-3) or headache.     ALPRAZolam  (XANAX ) 1 MG tablet Take 1 tablet (1 mg total) by mouth 2 (two) times daily as needed for anxiety. 60 tablet 2   baclofen  (LIORESAL ) 10 MG tablet Take 1 tablet (10 mg total) by mouth 3 (three) times daily. 270 tablet 1   bisacodyl  (DULCOLAX) 5 MG EC tablet Take 5 mg by mouth 2 (two) times daily as needed for moderate constipation.      busPIRone  (BUSPAR ) 15 MG tablet Take 1 tablet (15 mg total) by mouth 3 (three) times daily. 270 tablet 3   ciprofloxacin -dexamethasone  (CIPRODEX ) OTIC suspension  Place 4 drops into both ears as needed.     citalopram  (CELEXA ) 20 MG tablet Take 1 tablet (20 mg total) by mouth daily. 90 tablet 3   clotrimazole  (LOTRIMIN ) 1 % external solution Apply 1 Application topically daily.     hydrochlorothiazide  (HYDRODIURIL ) 25 MG tablet TAKE 1 TABLET (25 MG TOTAL) BY MOUTH DAILY. 90 tablet 1   HYDROcodone -acetaminophen  (NORCO) 7.5-325 MG tablet  Take 1 tablet by mouth every 6 (six) hours as needed for moderate pain (pain score 4-6). 120 tablet 0   hydrocortisone  (ANUSOL -HC) 25 MG suppository Place 1 suppository (25 mg total) rectally 2 (two) times daily. 24 suppository 0   hydrOXYzine  (ATARAX /VISTARIL ) 25 MG tablet TAKE 3 TO 4 TABLETS BY MOUTH EVERY 8 HOURS AS NEEDED FOR ANXIETY. MAX AMOUNT PER INSURANCE 360 tablet 2   ketoconazole  (NIZORAL ) 2 % cream Apply 1 Application topically daily.     levothyroxine  (SYNTHROID ) 50 MCG tablet TAKE 1 TABLET BY MOUTH EVERY DAY 90 tablet 0   lidocaine  (LIDODERM ) 5 % Place 1 patch onto the skin daily as needed (pain).     lidocaine  (XYLOCAINE ) 5 % ointment Apply 1 Application topically 3 (three) times daily as needed for moderate pain (pain score 4-6). 35.44 g 0   loratadine  (CLARITIN ) 10 MG tablet Take 10 mg by mouth 3 (three) times daily as needed for allergies (for seasonal allergies).     metoprolol  tartrate (LOPRESSOR ) 25 MG tablet TAKE 1 TABLET BY MOUTH TWICE A DAY 180 tablet 3   Nitroglycerin  0.4 % OINT Apply 1 inch (375 mg) ointment intra-anally every 12 hours for anal fissure 30 g 0   risperiDONE  (RISPERDAL ) 1 MG tablet TAKE 1 TABLET BY MOUTH EVERYDAY AT BEDTIME 90 tablet 3   rivaroxaban  (XARELTO ) 20 MG TABS tablet Take 1 tablet (20 mg total) by mouth daily with supper. Needs appt 30 tablet 0   tiZANidine  (ZANAFLEX ) 4 MG tablet Take 2 tablets (8 mg total) by mouth 3 (three) times daily. 540 tablet 1   triamcinolone  cream (KENALOG ) 0.1 % Apply 1 Application topically 2 (two) times daily. 30 g 0   Current Facility-Administered Medications  Medication Dose Route Frequency Provider Last Rate Last Admin   lidocaine  (XYLOCAINE ) 1 % (with pres) injection 3 mL  3 mL Other Once        lidocaine  (XYLOCAINE ) 1 % (with pres) injection 6 mL  6 mL Other Once Lovorn, Megan, MD       lidocaine  (XYLOCAINE ) 1 % (with pres) injection 6 mL  6 mL Other Once Lovorn, Megan, MD        Medication Side Effects:  None  Allergies:  Allergies  Allergen Reactions   Morphine  And Codeine Other (See Comments)    Didn't agree with me/wasn't very effective..   Lyrica [Pregabalin] Rash   Neurontin [Gabapentin] Other (See Comments)    Left patient sleepy all the time    Past Medical History:  Diagnosis Date   Asthma    seasonal   Back injury    Chronic   Bipolar disorder (HCC)    Cardiomyopathy (HCC)    hypertension controlled-helped with edema-Dr Nahser   Deafness    Left ear   Degenerative joint disease    with neck surgery   Depression    Diabetes  during pregnancy    Diastolic dysfunction    with possible mild LVOT gradient   Family history of adverse reaction to anesthesia     my daughter has PONV and  gets very grumpy   Fibromyalgia    neuropathy   Former cigarette smoker    H/O blood clots    due to side effect of medication  DVT   Headache(784.0)    migraines   Hypertension    Hypothyroidism    Leg cramps    Neuropathy    Pain    chronic    Pneumonia    PTSD (post-traumatic stress disorder)    Respiratory disorder    Schizophrenia (HCC)     Family History  Problem Relation Age of Onset   Coronary artery disease Father    Parkinson's disease Mother    Cancer Sister        breast   Diabetes Sister    Diabetes Brother     Social History   Socioeconomic History   Marital status: Married    Spouse name: Toribio   Number of children: 2   Years of education: 12   Highest education level: Not on file  Occupational History    Comment: homemaker  Tobacco Use   Smoking status: Every Day    Current packs/day: 0.50    Average packs/day: 0.5 packs/day for 25.0 years (12.5 ttl pk-yrs)    Types: Cigarettes   Smokeless tobacco: Never  Vaping Use   Vaping status: Never Used  Substance and Sexual Activity   Alcohol use: Not Currently    Comment: very rare   Drug use: No   Sexual activity: Not Currently  Other Topics Concern   Not on file  Social History  Narrative   Patient is married Multimedia programmer) and lives at home with her husband.   Patient has two adult children.   Patient is currently not working.   Patient has a high school education.   Patient is right-handed.   Patient drinks 1-2 cups of coffee per day.   Social Drivers of Corporate investment banker Strain: Not on file  Food Insecurity: Low Risk  (05/25/2023)   Received from [facility]   Hunger Vital Sign    Within the past 12 months, you worried that your food would run out before you got money to buy more: Never true    Within the past 12 months, the food you bought just didn't last and you didn't have money to get more. : Never true  Transportation Needs: No Transportation Needs (05/25/2023)   Received from [facility]    In the past 12 months, has lack of reliable transportation kept you from medical appointments, meetings, work or from getting things needed for daily living? : No  Physical Activity: Not on file  Stress: Not on file  Social Connections: Not on file  Intimate Partner Violence: Not on file    Past Medical History, Surgical history, Social history, and Family history were reviewed and updated as appropriate.   Please see review of systems for further details on the patient's review from today.   Objective:   Physical Exam:  There were no vitals taken for this visit.  Physical Exam Constitutional:      General: She is not in acute distress. Musculoskeletal:        General: No deformity.  Neurological:     Mental Status: She is alert and oriented to person, place, and time.     Coordination: Coordination normal.  Psychiatric:        Attention and Perception: Attention and perception normal. She does not perceive auditory or visual hallucinations.  Mood and Affect: Mood normal. Mood is not anxious or depressed. Affect is not labile, blunt, angry or inappropriate.        Speech: Speech normal.        Behavior: Behavior normal.         Thought Content: Thought content normal. Thought content is not paranoid or delusional. Thought content does not include homicidal or suicidal ideation. Thought content does not include homicidal or suicidal plan.        Cognition and Memory: Cognition and memory normal.        Judgment: Judgment normal.     Comments: Insight intact     Lab Review:     Component Value Date/Time   NA 128 (L) 10/05/2023 1453   K 3.8 10/05/2023 1453   CL 87 (L) 10/05/2023 1453   CO2 29 10/05/2023 1453   GLUCOSE 113 (H) 10/05/2023 1453   BUN 5 (L) 10/05/2023 1453   CREATININE 0.61 10/05/2023 1453   CREATININE 0.69 09/14/2023 1543   CALCIUM  9.4 10/05/2023 1453   PROT 6.0 (L) 02/27/2022 0507   ALBUMIN 3.0 (L) 02/27/2022 0507   AST 12 (L) 02/27/2022 0507   ALT 14 02/27/2022 0507   ALKPHOS 68 02/27/2022 0507   BILITOT 0.6 02/27/2022 0507   GFRNONAA >60 10/05/2023 1453   GFRAA >60 01/26/2020 0839       Component Value Date/Time   WBC 8.0 09/14/2023 1543   RBC 4.17 09/14/2023 1543   HGB 14.2 09/14/2023 1543   HCT 41.8 09/14/2023 1543   PLT 382 09/14/2023 1543   MCV 100.2 (H) 09/14/2023 1543   MCH 34.1 (H) 09/14/2023 1543   MCHC 34.0 09/14/2023 1543   RDW 12.7 09/14/2023 1543   LYMPHSABS 0.5 (L) 02/28/2022 0113   MONOABS 0.2 02/28/2022 0113   EOSABS 0.0 02/28/2022 0113   BASOSABS 0.0 02/28/2022 0113    No results found for: POCLITH, LITHIUM   No results found for: PHENYTOIN, PHENOBARB, VALPROATE, CBMZ   .res Assessment: Plan:    Plan:  Buspar  15mg  TID Xanax  1mg  BID  Celexa  20mg  daily Risperdal  1mg  at bedtime   6 months  25 minutes spent dedicated to the care of this patient on the date of this encounter to include pre-visit review of records, ordering of medication, post visit documentation, and face-to-face time with the patient discussing GAD, PTSD, panic attacks and BPD 2. Discussed continuing current medication regimen.  Therapy - Marval Bunde  Patient  advised to contact office with any questions, adverse effects, or acute worsening in signs and symptoms.  Discussed potential benefits, risk, and side effects of benzodiazepines to include potential risk of tolerance and dependence, as well as possible drowsiness.  Advised patient not to drive if experiencing drowsiness and to take lowest possible effective dose to minimize risk of dependence and tolerance.  Discussed potential metabolic side effects associated with atypical antipsychotics, as well as potential risk for movement side effects. Advised pt to contact office if movement side effects occur.   Diagnoses and all orders for this visit:  GAD (generalized anxiety disorder) -     ALPRAZolam  (XANAX ) 1 MG tablet; Take 1 tablet (1 mg total) by mouth 2 (two) times daily as needed for anxiety. -     busPIRone  (BUSPAR ) 15 MG tablet; Take 1 tablet (15 mg total) by mouth 3 (three) times daily.  Panic attacks -     ALPRAZolam  (XANAX ) 1 MG tablet; Take 1 tablet (1 mg total) by mouth 2 (two) times  daily as needed for anxiety.  Generalized anxiety disorder -     citalopram  (CELEXA ) 20 MG tablet; Take 1 tablet (20 mg total) by mouth daily. -     risperiDONE  (RISPERDAL ) 1 MG tablet; TAKE 1 TABLET BY MOUTH EVERYDAY AT BEDTIME  Bipolar II disorder (HCC) -     risperiDONE  (RISPERDAL ) 1 MG tablet; TAKE 1 TABLET BY MOUTH EVERYDAY AT BEDTIME  PTSD (post-traumatic stress disorder) -     risperiDONE  (RISPERDAL ) 1 MG tablet; TAKE 1 TABLET BY MOUTH EVERYDAY AT BEDTIME     Please see After Visit Summary for patient specific instructions.  Future Appointments  Date Time Provider Department Center  04/12/2024  9:00 AM Lovorn, Duwaine, MD [facility]    No orders of the defined types were placed in this encounter.     -------------------------------

## 2024-04-04 ENCOUNTER — Other Ambulatory Visit: Payer: Self-pay | Admitting: Family Medicine

## 2024-04-05 ENCOUNTER — Telehealth: Payer: Self-pay

## 2024-04-05 NOTE — Telephone Encounter (Signed)
 Patient call for a refill of Hydrocodone  & Tizanidine . If granted,please send to [facility].

## 2024-04-06 ENCOUNTER — Other Ambulatory Visit: Payer: Self-pay | Admitting: Family Medicine

## 2024-04-06 MED ORDER — TIZANIDINE HCL 4 MG PO TABS: 8.0000 mg | ORAL_TABLET | Freq: Three times a day (TID) | ORAL | 1 refills | Status: DC

## 2024-04-06 MED ORDER — HYDROCODONE-ACETAMINOPHEN 7.5-325 MG PO TABS: 1.0000 | ORAL_TABLET | Freq: Four times a day (QID) | ORAL | 0 refills | Status: DC | PRN

## 2024-04-06 NOTE — Telephone Encounter (Signed)
 Refills sent. Prefer to monitor q 6 mo for this. Thx.

## 2024-04-12 ENCOUNTER — Encounter: Attending: Physical Medicine and Rehabilitation | Admitting: Physical Medicine and Rehabilitation

## 2024-04-12 ENCOUNTER — Encounter: Payer: Self-pay | Admitting: Physical Medicine and Rehabilitation

## 2024-04-12 VITALS — BP 138/93 | HR 57 | Ht 65.0 in | Wt 136.0 lb

## 2024-04-12 DIAGNOSIS — M47812 Spondylosis without myelopathy or radiculopathy, cervical region: Secondary | ICD-10-CM | POA: Insufficient documentation

## 2024-04-12 DIAGNOSIS — M5412 Radiculopathy, cervical region: Secondary | ICD-10-CM | POA: Insufficient documentation

## 2024-04-12 DIAGNOSIS — M7918 Myalgia, other site: Secondary | ICD-10-CM | POA: Diagnosis not present

## 2024-04-12 DIAGNOSIS — M797 Fibromyalgia: Secondary | ICD-10-CM | POA: Diagnosis not present

## 2024-04-12 MED ORDER — HYDROCODONE-ACETAMINOPHEN 7.5-325 MG PO TABS: 1.0000 | ORAL_TABLET | Freq: Four times a day (QID) | ORAL | 0 refills | Status: DC | PRN

## 2024-04-12 MED ORDER — BACLOFEN 10 MG PO TABS: 10.0000 mg | ORAL_TABLET | Freq: Three times a day (TID) | ORAL | 1 refills | Status: AC

## 2024-04-12 MED ORDER — LIDOCAINE HCL 1 % IJ SOLN
3.0000 mL | Freq: Once | INTRAMUSCULAR | Status: AC
  Administered 2024-04-12: 3 mL

## 2024-04-12 NOTE — Patient Instructions (Signed)
 Plan:  Let's xray cervical spine- to make sure hardware is doing better- will order at [address] [facility]- just walk in!   2.  Just got Refill for Zanaflex  and Norco 7.5 mg- will give another refill of Norco    3. Con't Baclofen  10 mg 3x/day- sent in 6months refill  4. Patient here for trigger point injections for  Consent done and on chart.  Cleaned areas with alcohol and injected using a 27 gauge 1.5 inch needle  Injected  3cc- none wasted Using 1% Lidocaine  with no EPI  Upper traps B/L x3- also got supraspinatus B/L  Levators- B/L  Posterior scalenes Middle scalenes- B/L  Splenius Capitus Pectoralis Major Rhomboids Infraspinatus Teres Major/minor Thoracic paraspinals Lumbar paraspinals Other injections-    Patient's level of pain prior was 8/10-  Current level of pain after injections is- still about the same  There was no bleeding or complications.  Patient was advised to drink a lot of water on day after injections to flush system Will have increased soreness for 12-48 hours after injections.  Can use Lidocaine  patches the day AFTER injections Can use theracane on day of injections in places didn't inject Can use heating pad 4-6 hours AFTER injections  5. F/U in 6 weeks- trP inj and f/u on pain

## 2024-04-12 NOTE — Progress Notes (Signed)
 Patient is a 63 yr old female with hx of multiple spinal surgeries- all surgeries on neck and neck and back pain, fibromyalgia, and previous L carpal tunnel surgery- also hx of bipolar II d/o;  Here for trigger point injections and f/u on chronic pain.    Having a lot of neck pain.  In the middle of neck posterior- feels like when hardware was going.  But has new hardware.  Not in mood for MRI-    Just got refills on Zanaflex  and Norco-  Needs refill of Baclofen    Trying to use Norco sparingly-  wakes up from sleep with pain - sometimes takes 2-3 tabs/day, but sometimes uses 4 tabs/day when wakes from sleep.   Ready for shots!!   Plan:  Let's xray cervical spine- to make sure hardware is doing better- will order at [address] [facility]- just walk in!   2.  Just got Refill for Zanaflex  and Norco 7.5 mg- will give another refill of Norco    3. Con't Baclofen  10 mg 3x/day- sent in 6months refill  4. Patient here for trigger point injections for  Consent done and on chart.  Cleaned areas with alcohol and injected using a 27 gauge 1.5 inch needle  Injected  3cc- none wasted Using 1% Lidocaine  with no EPI  Upper traps B/L x3- also got supraspinatus B/L  Levators- B/L  Posterior scalenes Middle scalenes- B/L  Splenius Capitus Pectoralis Major Rhomboids Infraspinatus Teres Major/minor Thoracic paraspinals Lumbar paraspinals Other injections-    Patient's level of pain prior was 8/10-  Current level of pain after injections is- still about the same  There was no bleeding or complications.  Patient was advised to drink a lot of water on day after injections to flush system Will have increased soreness for 12-48 hours after injections.  Can use Lidocaine  patches the day AFTER injections Can use theracane on day of injections in places didn't inject Can use heating pad 4-6 hours AFTER injections  5. F/U in 6 weeks- trP inj and f/u on pain   I spent a  total of  27  minutes on total care today- >50% coordination of care- due to 5 minutes on injection and rest discussing pain and management

## 2024-04-29 ENCOUNTER — Other Ambulatory Visit: Payer: Self-pay | Admitting: Family Medicine

## 2024-05-26 ENCOUNTER — Ambulatory Visit: Admitting: Physical Medicine and Rehabilitation

## 2024-05-30 DIAGNOSIS — H7293 Unspecified perforation of tympanic membrane, bilateral: Secondary | ICD-10-CM | POA: Diagnosis not present

## 2024-05-30 DIAGNOSIS — Z9089 Acquired absence of other organs: Secondary | ICD-10-CM | POA: Diagnosis not present

## 2024-05-30 DIAGNOSIS — Z974 Presence of external hearing-aid: Secondary | ICD-10-CM | POA: Diagnosis not present

## 2024-05-30 DIAGNOSIS — H906 Mixed conductive and sensorineural hearing loss, bilateral: Secondary | ICD-10-CM | POA: Diagnosis not present

## 2024-05-30 DIAGNOSIS — H6123 Impacted cerumen, bilateral: Secondary | ICD-10-CM | POA: Diagnosis not present

## 2024-06-07 ENCOUNTER — Telehealth: Payer: Self-pay | Admitting: Physical Medicine and Rehabilitation

## 2024-06-07 NOTE — Telephone Encounter (Signed)
 P needs refill of hydrocodone   [facility] [place]

## 2024-06-13 ENCOUNTER — Other Ambulatory Visit: Payer: Self-pay | Admitting: Physical Medicine and Rehabilitation

## 2024-06-13 MED ORDER — HYDROCODONE-ACETAMINOPHEN 7.5-325 MG PO TABS: 1.0000 | ORAL_TABLET | Freq: Four times a day (QID) | ORAL | 0 refills | Status: DC | PRN

## 2024-06-13 NOTE — Telephone Encounter (Signed)
 Patient is out of Hydrocodone  7.5-325 MG. [facility] [place]  Thank you. (Dr. Lovorn is out of the office this week).  Filled  Written  ID  Drug  QTY  Days  Prescriber  RX #  Dispenser  Refill  Daily Dose*  Pymt Type  PMP  06/04/2024 03/24/2024 1  Alprazolam  1 Mg Tablet 60.00 30 Re Moz [id number] Nor (6468) 2/2 4.00 LME Comm Ins [place] 05/05/2024 03/24/2024 1  Alprazolam  1 Mg Tablet 60.00 30 Re Moz [id number] Nor (6468) 1/2 4.00 LME Comm Ins [place] 05/04/2024 04/12/2024 1  Hydrocodone -Acetamin 7.5-325 120.00 30 Me Lov [id number] Nor (6468) 0/0 30.00 MME Comm Ins [place] 04/07/2024 03/24/2024 1  Alprazolam  1 Mg Tablet 60.00 30 Re Moz [id number] Nor (6468) 0/2 4.00 LME Comm Ins  04/06/2024 04/06/2024 1  Hydrocodone -Acetamin 7.5-325 120.00 30 Me Lov [id number] Nor (6468) 0/0 30.00 MME Comm Ins

## 2024-07-03 ENCOUNTER — Other Ambulatory Visit: Payer: Self-pay | Admitting: *Deleted

## 2024-07-03 ENCOUNTER — Other Ambulatory Visit: Payer: Self-pay | Admitting: Adult Health

## 2024-07-03 DIAGNOSIS — F410 Panic disorder [episodic paroxysmal anxiety] without agoraphobia: Secondary | ICD-10-CM

## 2024-07-03 DIAGNOSIS — F411 Generalized anxiety disorder: Secondary | ICD-10-CM

## 2024-07-03 MED ORDER — TIZANIDINE HCL 4 MG PO TABS: 8.0000 mg | ORAL_TABLET | Freq: Three times a day (TID) | ORAL | 1 refills | Status: AC

## 2024-07-03 NOTE — Telephone Encounter (Signed)
 Pt LVM @ 9:41a requesting refill of Xanax  to   [id number] [place], [place] - [address] [place] [address] Phone: [phone]  Fax: [phone]   Next appt 2/2

## 2024-07-07 ENCOUNTER — Ambulatory Visit
Admission: RE | Admit: 2024-07-07 | Discharge: 2024-07-07 | Disposition: A | Discharge status: Home / Self Care | Source: Ambulatory Visit | Attending: Physical Medicine and Rehabilitation | Admitting: Physical Medicine and Rehabilitation

## 2024-07-07 DIAGNOSIS — M5412 Radiculopathy, cervical region: Secondary | ICD-10-CM

## 2024-07-07 DIAGNOSIS — M47812 Spondylosis without myelopathy or radiculopathy, cervical region: Secondary | ICD-10-CM | POA: Diagnosis not present

## 2024-07-12 ENCOUNTER — Encounter: Payer: Self-pay | Admitting: Physical Medicine and Rehabilitation

## 2024-07-12 ENCOUNTER — Encounter: Attending: Physical Medicine and Rehabilitation | Admitting: Physical Medicine and Rehabilitation

## 2024-07-12 VITALS — BP 132/89 | HR 59 | Ht 65.0 in | Wt 134.4 lb

## 2024-07-12 DIAGNOSIS — M47812 Spondylosis without myelopathy or radiculopathy, cervical region: Secondary | ICD-10-CM | POA: Diagnosis not present

## 2024-07-12 DIAGNOSIS — M7918 Myalgia, other site: Secondary | ICD-10-CM | POA: Diagnosis not present

## 2024-07-12 DIAGNOSIS — G894 Chronic pain syndrome: Secondary | ICD-10-CM | POA: Diagnosis not present

## 2024-07-12 DIAGNOSIS — Z5181 Encounter for therapeutic drug level monitoring: Secondary | ICD-10-CM | POA: Insufficient documentation

## 2024-07-12 DIAGNOSIS — G8929 Other chronic pain: Secondary | ICD-10-CM | POA: Insufficient documentation

## 2024-07-12 MED ORDER — HYDROCODONE-ACETAMINOPHEN 7.5-325 MG PO TABS: 1.0000 | ORAL_TABLET | Freq: Four times a day (QID) | ORAL | 0 refills | Status: DC | PRN

## 2024-07-12 MED ORDER — LIDOCAINE HCL 1 % IJ SOLN
3.0000 mL | Freq: Once | INTRAMUSCULAR | Status: AC
  Administered 2024-07-12: 3 mL

## 2024-07-12 NOTE — Addendum Note (Signed)
 Addended by: GEORGINA BARI CROME on: 07/12/2024 10:40 AM   Modules accepted: Orders

## 2024-07-12 NOTE — Patient Instructions (Signed)
 Plan: Xray showed fusion stable and screws  and hardware in right place.  Went over with pt.    2. Needs refill on Norco - 7.5/325 mg-  upt o 4x/day- as needed for chronic pain-   3.  Will need to do oral drug screen. - since at 5 months.  Ordered Tox screen oral   4.   Con't Baclofen  10 mg 3x/day- last filled 04/12/24  5. Con't Tizanidine - 8 mg 3x/day- just got filled 07/03/24  Patient here for trigger point injections for  Consent done and on chart.  Cleaned areas with alcohol and injected using a 27 gauge 1.5 inch needle  Injected  3cc- none wasted Using 1% Lidocaine  with no EPI  Upper traps B/L traps  Levators B/L x2 Posterior scalenes Middle scalenes- B/L  Splenius Capitus B/L  Pectoralis Major- doesn't like pecs done Rhomboids- declined- although tight- less tight Infraspinatus Teres Major/minor Thoracic paraspinals Lumbar paraspinals Other injections-    There was no bleeding or complications.  Patient was advised to drink a lot of water on day after injections to flush system Will have increased soreness for 12-48 hours after injections.  Can use Lidocaine  patches the day AFTER injections Can use theracane on day of injections in places didn't inject Can use heating pad 4-6 hours AFTER injections  6. F/U  in 6 weeks if possible for trp injections. If not- somewhere in the next 3 months-

## 2024-07-12 NOTE — Progress Notes (Signed)
 Patient is a 63 yr old female with hx of multiple spinal surgeries- all surgeries on neck and neck and back pain, fibromyalgia, and previous L carpal tunnel surgery- also hx of bipolar II d/o;  Here for trigger point injections and f/u on chronic pain.       Missed last appointment- daughter had to work.  Was her ride Husband was back at work.    Got xray last week-    Did have withdrawals when ran out of pain meds- called multiple times for meds and couldn't get prior- said had difficulty getting through to clinic.    Also reports more neck pain on Left- when gets trigger point injections, extremely helpful, but only lasts  ~ 1 month to 5 weeks at most- usually sees q6 weeks, but cannot be seen til 6 week sand missed appt this last time, so went 7 weeks with pain control worn off from trP injections- Norco helps, but not as much as it used to.  Feels like the dose is not as strong as used to be.   Tries to not take 4x/day-   Plan: Xray showed fusion stable and screws  and hardware in right place.  Went over with pt.    2. Needs refill on Norco - 7.5/325 mg-   3.  Will need to do oral drug screen. - since at 5 months.  Ordered Tox screen oral   4.   Con't Baclofen  10 mg 3x/day- last filled 04/12/24  5. Con't Tizanidine - 8 mg 3x/day- just got filled 07/03/24  Patient here for trigger point injections for  Consent done and on chart.  Cleaned areas with alcohol and injected using a 27 gauge 1.5 inch needle  Injected  3cc- none wasted Using 1% Lidocaine  with no EPI  Upper traps B/L traps  Levators B/L x2 Posterior scalenes Middle scalenes- B/L  Splenius Capitus B/L  Pectoralis Major- doesn't like pecs done Rhomboids- declined- although tight- less tight Infraspinatus Teres Major/minor Thoracic paraspinals Lumbar paraspinals Other injections-    There was no bleeding or complications.  Patient was advised to drink a lot of water on day after injections to flush  system Will have increased soreness for 12-48 hours after injections.  Can use Lidocaine  patches the day AFTER injections Can use theracane on day of injections in places didn't inject Can use heating pad 4-6 hours AFTER injections  6. F/U  in 6 weeks if possible for trp injections. If not- somewhere in the next 3 months-   I spent a total of  26  minutes on total care today- >50% coordination of care- due to 6 minutes on trp injections- rest d/w pt about pain contorl- we discussed doses of meds- will kepe the same- also ordered Oral Drug screen.

## 2024-07-12 NOTE — Addendum Note (Signed)
 Addended by: GEORGINA BARI CROME on: 07/12/2024 10:31 AM   Modules accepted: Orders

## 2024-07-16 LAB — DRUG TOX MONITOR 1 W/CONF, ORAL FLD
AMINOCLONAZEPAM: NEGATIVE ng/mL (ref <0.50)
Alprazolam: 22.48 ng/mL — ABNORMAL HIGH (ref <0.50)
Amphetamines: NEGATIVE ng/mL (ref <10)
Barbiturates: NEGATIVE ng/mL (ref <10)
Benzodiazepines: POSITIVE ng/mL — AB (ref <0.50)
Buprenorphine: NEGATIVE ng/mL (ref <0.10)
Chlordiazepoxide: NEGATIVE ng/mL (ref <0.50)
Clonazepam: NEGATIVE ng/mL (ref <0.50)
Cocaine: NEGATIVE ng/mL (ref <5.0)
Codeine: NEGATIVE ng/mL (ref <2.5)
Cotinine: >250 ng/mL — ABNORMAL HIGH (ref <5.0)
Diazepam: NEGATIVE ng/mL (ref <0.50)
Dihydrocodeine: 9.6 ng/mL — ABNORMAL HIGH (ref <2.5)
Fentanyl: NEGATIVE ng/mL (ref <0.10)
Flunitrazepam: NEGATIVE ng/mL (ref <0.50)
Flurazepam: NEGATIVE ng/mL (ref <0.50)
Heroin Metabolite: NEGATIVE ng/mL (ref <1.0)
Hydrocodone: 120.6 ng/mL — ABNORMAL HIGH (ref <2.5)
Hydromorphone: NEGATIVE ng/mL (ref <2.5)
Lorazepam: NEGATIVE ng/mL (ref <0.50)
MARIJUANA: NEGATIVE ng/mL (ref <2.5)
MDMA: NEGATIVE ng/mL (ref <10)
Meprobamate: NEGATIVE ng/mL (ref <2.5)
Methadone: NEGATIVE ng/mL (ref <5.0)
Midazolam: NEGATIVE ng/mL (ref <0.50)
Morphine: NEGATIVE ng/mL (ref <2.5)
Nicotine Metabolite: POSITIVE ng/mL — AB (ref <5.0)
Nordiazepam: NEGATIVE ng/mL (ref <0.50)
Norhydrocodone: 8.6 ng/mL — ABNORMAL HIGH (ref <2.5)
Noroxycodone: NEGATIVE ng/mL (ref <2.5)
Opiates: POSITIVE ng/mL — AB (ref <2.5)
Oxazepam: NEGATIVE ng/mL (ref <0.50)
Oxycodone: NEGATIVE ng/mL (ref <2.5)
Oxymorphone: NEGATIVE ng/mL (ref <2.5)
Phencyclidine: NEGATIVE ng/mL (ref <10)
Tapentadol: NEGATIVE ng/mL (ref <5.0)
Temazepam: NEGATIVE ng/mL (ref <0.50)
Tramadol: NEGATIVE ng/mL (ref <5.0)
Triazolam: NEGATIVE ng/mL (ref <0.50)
Zolpidem: NEGATIVE ng/mL (ref <5.0)

## 2024-07-16 LAB — DRUG TOX ALC METAB W/CON, ORAL FLD: Alcohol Metabolite: NEGATIVE ng/mL (ref <25)

## 2024-08-09 ENCOUNTER — Telehealth: Payer: Self-pay

## 2024-08-09 NOTE — Telephone Encounter (Signed)
 Patient called asking for a refill on Hydrocodone /APAP but it looks Dr. Cornelio sent two prescriptions last month. I tried calling patient to see if she checked with the pharmacy but no answer and no secure VM.

## 2024-08-15 NOTE — Telephone Encounter (Signed)
 Return Ms. Lasure call,  No answer. Called the pharmacy, her prescription is at the pharmacy. Left her message to call the pharmacy.

## 2024-08-15 NOTE — Telephone Encounter (Signed)
 Patient been out of meds for 2 days. Please address in Dr. Jyl absence.

## 2024-09-13 ENCOUNTER — Telehealth: Payer: Self-pay

## 2024-09-13 NOTE — Telephone Encounter (Signed)
 Patient requesting       to be sent to [facility] in [place].

## 2024-09-14 ENCOUNTER — Telehealth: Payer: Self-pay | Admitting: Registered Nurse

## 2024-09-14 DIAGNOSIS — M47812 Spondylosis without myelopathy or radiculopathy, cervical region: Secondary | ICD-10-CM

## 2024-09-14 DIAGNOSIS — G894 Chronic pain syndrome: Secondary | ICD-10-CM

## 2024-09-14 MED ORDER — HYDROCODONE-ACETAMINOPHEN 7.5-325 MG PO TABS: 1.0000 | ORAL_TABLET | Freq: Four times a day (QID) | ORAL | 0 refills | Status: DC | PRN

## 2024-09-14 NOTE — Telephone Encounter (Signed)
 If patient is called please leave a message or call back. She stated she has a hearing deficit.

## 2024-09-14 NOTE — Telephone Encounter (Signed)
 PDMP was Reviewed.  Dr Cornelio note was reviewed.  Hydrocodone  e-scribed to pharmacy.  Will have Robbin call patient

## 2024-09-25 ENCOUNTER — Ambulatory Visit: Admitting: Adult Health

## 2024-09-25 ENCOUNTER — Encounter: Payer: Self-pay | Admitting: Adult Health

## 2024-09-25 DIAGNOSIS — F410 Panic disorder [episodic paroxysmal anxiety] without agoraphobia: Secondary | ICD-10-CM

## 2024-09-25 DIAGNOSIS — F4310 Post-traumatic stress disorder, unspecified: Secondary | ICD-10-CM

## 2024-09-25 DIAGNOSIS — F3181 Bipolar II disorder: Secondary | ICD-10-CM

## 2024-09-25 DIAGNOSIS — F411 Generalized anxiety disorder: Secondary | ICD-10-CM

## 2024-09-25 MED ORDER — ALPRAZOLAM 1 MG PO TABS: 1.0000 mg | ORAL_TABLET | Freq: Two times a day (BID) | ORAL | 2 refills | Status: AC | PRN

## 2024-09-27 ENCOUNTER — Other Ambulatory Visit: Payer: Self-pay | Admitting: Physical Medicine and Rehabilitation

## 2024-09-27 ENCOUNTER — Other Ambulatory Visit: Payer: Self-pay

## 2024-09-27 ENCOUNTER — Encounter: Attending: Physical Medicine and Rehabilitation | Admitting: Physical Medicine and Rehabilitation

## 2024-09-27 ENCOUNTER — Encounter: Payer: Self-pay | Admitting: Physical Medicine and Rehabilitation

## 2024-09-27 VITALS — BP 188/86 | HR 57 | Ht 65.0 in | Wt 132.0 lb

## 2024-09-27 DIAGNOSIS — G894 Chronic pain syndrome: Secondary | ICD-10-CM

## 2024-09-27 DIAGNOSIS — M5412 Radiculopathy, cervical region: Secondary | ICD-10-CM | POA: Diagnosis not present

## 2024-09-27 DIAGNOSIS — M7918 Myalgia, other site: Secondary | ICD-10-CM

## 2024-09-27 DIAGNOSIS — M797 Fibromyalgia: Secondary | ICD-10-CM | POA: Diagnosis not present

## 2024-09-27 MED ORDER — METOPROLOL TARTRATE 25 MG PO TABS: 25.0000 mg | ORAL_TABLET | Freq: Two times a day (BID) | ORAL | 3 refills | Status: AC

## 2024-09-27 MED ORDER — HYDROCODONE-ACETAMINOPHEN 10-325 MG PO TABS: 1.0000 | ORAL_TABLET | Freq: Four times a day (QID) | ORAL | 0 refills | Status: AC | PRN

## 2024-09-27 MED ORDER — BACLOFEN 10 MG PO TABS: 10.0000 mg | ORAL_TABLET | Freq: Three times a day (TID) | ORAL | 1 refills | Status: AC

## 2024-09-27 MED ORDER — LIDOCAINE HCL 1 % IJ SOLN
3.0000 mL | Freq: Once | INTRAMUSCULAR | Status: AC
  Administered 2024-09-27: 3 mL

## 2024-09-27 NOTE — Progress Notes (Signed)
 Patient is a 64 yr old female with hx of multiple spinal surgeries- all surgeries on neck and neck and back pain, fibromyalgia, and previous L carpal tunnel surgery- also hx of bipolar II d/o;  Here for trigger point injections and f/u on chronic pain.            Hates the cold- does not help her pain- makes it worse.  Been having a worse time with pain-   Usually 6-7/10- now 8/10 with cold weather.   Taking Norco 3-4x/day.  Was filled 09/15/24  Does need refill on baclofen - and has Zanaflex -   Hurting so much more- asking to increase Norco if possible   Exam: Awake, alert, no assistive device, nAD mild audible crackle from breathing trP's In locations injected Also BP rechecked 162/78 HR in 70's- around 72- not 193 like machine read-     Plan: Last drug screen was consistent with meds.  Done 06/2024   2. Will refill Baclofen  10 mg 3x/day- 3 months supply- with 1 refill   3. Con't Tizanidine /Zanaflex   8 mg 3x/day- has refills- last refilled 11/25.     4. Will try increasing Norco/hydrocodone  to 10/325 mg- 4x/day- #120- for pt- since weathe ris so cold/pain so bad- but that's the max dose of Norco.  Will do for winter and re-eval in spring.    5. Patient here for trigger point injections for  Consent done and on chart.  Cleaned areas with alcohol and injected using a 27 gauge 1.5 inch needle  Injected 3cc- none wasted Using 1% Lidocaine  with no EPI  Upper traps B/L x3- due to tightness Levators- B/L x2- due to tightness Posterior scalenes Middle scalenes- B/L  Splenius Capitus- B/L  Pectoralis Major- doesn't want to get injected Rhomboids B/L  Infraspinatus Teres Major/minor Thoracic paraspinals Lumbar paraspinals- B/L  Other injections-    Patient's level of pain prior was 8/10 Current level of pain after injections is- 8/10  There was no bleeding or complications.  Patient was advised to drink a lot of water on day after injections to flush  system Will have increased soreness for 12-48 hours after injections.  Can use Lidocaine  patches the day AFTER injections Can use theracane on day of injections in places didn't inject Can use heating pad 4-6 hours AFTER injections   6. F/U in 3 months- for trp Injections and f/u on chronic pain.    I spent a total of 28   minutes on total care today- >50% coordination of care- due to d/w pt about pain meds= made changes- also refilled meds- and then 6 min for trp injections

## 2024-09-27 NOTE — Patient Instructions (Signed)
" °  Plan: Last drug screen was consistent with meds.    2. Will refill Baclofen  10 mg 3x/day- 3 months supply- with 1 refill   3. Con't Tizanidine /Zanaflex   8 mg 3x/day- has refills- last refilled 11/25.     4. Will try increasing Norco/hydrocodone  to 10/325 mg- 4x/day- #120- for pt- since weathe ris so cold/pain so bad- but that's the max dose of Norco.  Will do for winter and re-eval in spring.    5. Patient here for trigger point injections for  Consent done and on chart.  Cleaned areas with alcohol and injected using a 27 gauge 1.5 inch needle  Injected 3cc- none wasted Using 1% Lidocaine  with no EPI  Upper traps B/L x3- due to tightness Levators- B/L x2- due to tightness Posterior scalenes Middle scalenes- B/L  Splenius Capitus- B/L  Pectoralis Major- doesn't want to get injected Rhomboids B/L  Infraspinatus Teres Major/minor Thoracic paraspinals Lumbar paraspinals- B/L  Other injections-    Patient's level of pain prior was 8/10 Current level of pain after injections is- 8/10  There was no bleeding or complications.  Patient was advised to drink a lot of water on day after injections to flush system Will have increased soreness for 12-48 hours after injections.  Can use Lidocaine  patches the day AFTER injections Can use theracane on day of injections in places didn't inject Can use heating pad 4-6 hours AFTER injections   6. F/U in 3 months- for trp Injections and f/u on chronic pain.  "

## 2025-01-05 ENCOUNTER — Encounter: Attending: Physical Medicine and Rehabilitation | Admitting: Physical Medicine and Rehabilitation

## 2025-03-26 ENCOUNTER — Telehealth: Admitting: Adult Health
